# Patient Record
Sex: Female | Born: 1980 | Race: Black or African American | Hispanic: No | Smoking: Never smoker
Health system: Southern US, Community
[De-identification: ages and names within clinical notes are randomized; demographics above are authoritative.]

## PROBLEM LIST (undated history)

## (undated) ENCOUNTER — Inpatient Hospital Stay (HOSPITAL_COMMUNITY): Payer: Self-pay

## (undated) DIAGNOSIS — F32A Depression, unspecified: Secondary | ICD-10-CM

## (undated) DIAGNOSIS — F419 Anxiety disorder, unspecified: Secondary | ICD-10-CM

## (undated) DIAGNOSIS — K219 Gastro-esophageal reflux disease without esophagitis: Secondary | ICD-10-CM

## (undated) DIAGNOSIS — L0390 Cellulitis, unspecified: Secondary | ICD-10-CM

## (undated) DIAGNOSIS — B9562 Methicillin resistant Staphylococcus aureus infection as the cause of diseases classified elsewhere: Secondary | ICD-10-CM

## (undated) DIAGNOSIS — D573 Sickle-cell trait: Secondary | ICD-10-CM

## (undated) DIAGNOSIS — F329 Major depressive disorder, single episode, unspecified: Secondary | ICD-10-CM

## (undated) DIAGNOSIS — R51 Headache: Secondary | ICD-10-CM

## (undated) DIAGNOSIS — Z98891 History of uterine scar from previous surgery: Secondary | ICD-10-CM

## (undated) DIAGNOSIS — I209 Angina pectoris, unspecified: Secondary | ICD-10-CM

## (undated) DIAGNOSIS — R011 Cardiac murmur, unspecified: Secondary | ICD-10-CM

## (undated) DIAGNOSIS — A630 Anogenital (venereal) warts: Secondary | ICD-10-CM

## (undated) DIAGNOSIS — Z8719 Personal history of other diseases of the digestive system: Secondary | ICD-10-CM

## (undated) DIAGNOSIS — J302 Other seasonal allergic rhinitis: Secondary | ICD-10-CM

## (undated) DIAGNOSIS — K5900 Constipation, unspecified: Secondary | ICD-10-CM

## (undated) DIAGNOSIS — I499 Cardiac arrhythmia, unspecified: Secondary | ICD-10-CM

## (undated) HISTORY — PX: UPPER GI ENDOSCOPY: SHX6162

## (undated) HISTORY — DX: Anogenital (venereal) warts: A63.0

## (undated) HISTORY — DX: Major depressive disorder, single episode, unspecified: F32.9

## (undated) HISTORY — PX: HERNIA REPAIR: SHX51

## (undated) HISTORY — PX: COLONOSCOPY: SHX174

## (undated) HISTORY — PX: CERVICAL CONE BIOPSY: SUR198

## (undated) HISTORY — PX: WISDOM TOOTH EXTRACTION: SHX21

## (undated) HISTORY — DX: Cardiac murmur, unspecified: R01.1

## (undated) HISTORY — PX: MARSUPIALIZATION URETHRAL DIVERTICULUM: SUR844

## (undated) HISTORY — DX: Other seasonal allergic rhinitis: J30.2

## (undated) HISTORY — DX: Depression, unspecified: F32.A

---
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
## 2002-02-04 ENCOUNTER — Emergency Department (HOSPITAL_COMMUNITY): Admission: EM | Admit: 2002-02-04 | Discharge: 2002-02-04 | Payer: Self-pay | Admitting: Emergency Medicine

## 2002-02-05 ENCOUNTER — Encounter: Payer: Self-pay | Admitting: Emergency Medicine

## 2002-03-15 ENCOUNTER — Encounter: Admission: RE | Admit: 2002-03-15 | Discharge: 2002-03-15 | Payer: Self-pay | Admitting: Obstetrics and Gynecology

## 2002-03-19 ENCOUNTER — Inpatient Hospital Stay (HOSPITAL_COMMUNITY): Admission: AD | Admit: 2002-03-19 | Discharge: 2002-03-19 | Payer: Self-pay | Admitting: *Deleted

## 2002-03-26 ENCOUNTER — Encounter (INDEPENDENT_AMBULATORY_CARE_PROVIDER_SITE_OTHER): Payer: Self-pay

## 2002-03-26 ENCOUNTER — Other Ambulatory Visit: Admission: RE | Admit: 2002-03-26 | Discharge: 2002-03-26 | Payer: Self-pay | Admitting: *Deleted

## 2002-03-26 ENCOUNTER — Encounter: Admission: RE | Admit: 2002-03-26 | Discharge: 2002-03-26 | Payer: Self-pay | Admitting: *Deleted

## 2002-03-29 ENCOUNTER — Inpatient Hospital Stay (HOSPITAL_COMMUNITY): Admission: AD | Admit: 2002-03-29 | Discharge: 2002-03-29 | Payer: Self-pay | Admitting: Family Medicine

## 2002-04-16 ENCOUNTER — Encounter: Admission: RE | Admit: 2002-04-16 | Discharge: 2002-04-16 | Payer: Self-pay | Admitting: Obstetrics and Gynecology

## 2002-09-03 ENCOUNTER — Encounter: Admission: RE | Admit: 2002-09-03 | Discharge: 2002-09-03 | Payer: Self-pay | Admitting: Obstetrics and Gynecology

## 2002-09-03 ENCOUNTER — Encounter (INDEPENDENT_AMBULATORY_CARE_PROVIDER_SITE_OTHER): Payer: Self-pay

## 2002-09-27 ENCOUNTER — Encounter: Payer: Self-pay | Admitting: Emergency Medicine

## 2002-09-27 ENCOUNTER — Inpatient Hospital Stay (HOSPITAL_COMMUNITY): Admission: EM | Admit: 2002-09-27 | Discharge: 2002-09-28 | Payer: Self-pay | Admitting: Emergency Medicine

## 2002-09-27 ENCOUNTER — Encounter: Payer: Self-pay | Admitting: Internal Medicine

## 2002-10-08 ENCOUNTER — Encounter: Admission: RE | Admit: 2002-10-08 | Discharge: 2002-10-08 | Payer: Self-pay | Admitting: Obstetrics and Gynecology

## 2003-02-21 ENCOUNTER — Emergency Department (HOSPITAL_COMMUNITY): Admission: EM | Admit: 2003-02-21 | Discharge: 2003-02-21 | Payer: Self-pay | Admitting: Emergency Medicine

## 2003-02-28 ENCOUNTER — Emergency Department (HOSPITAL_COMMUNITY): Admission: EM | Admit: 2003-02-28 | Discharge: 2003-03-01 | Payer: Self-pay | Admitting: Emergency Medicine

## 2003-03-18 ENCOUNTER — Emergency Department (HOSPITAL_COMMUNITY): Admission: EM | Admit: 2003-03-18 | Discharge: 2003-03-18 | Payer: Self-pay | Admitting: Family Medicine

## 2003-03-28 ENCOUNTER — Emergency Department (HOSPITAL_COMMUNITY): Admission: AD | Admit: 2003-03-28 | Discharge: 2003-03-28 | Payer: Self-pay | Admitting: Family Medicine

## 2003-04-13 ENCOUNTER — Emergency Department (HOSPITAL_COMMUNITY): Admission: AD | Admit: 2003-04-13 | Discharge: 2003-04-13 | Payer: Self-pay | Admitting: Family Medicine

## 2003-04-15 ENCOUNTER — Emergency Department (HOSPITAL_COMMUNITY): Admission: AD | Admit: 2003-04-15 | Discharge: 2003-04-15 | Payer: Self-pay | Admitting: Family Medicine

## 2003-06-23 ENCOUNTER — Emergency Department (HOSPITAL_COMMUNITY): Admission: EM | Admit: 2003-06-23 | Discharge: 2003-06-23 | Payer: Self-pay | Admitting: Family Medicine

## 2003-07-31 ENCOUNTER — Emergency Department (HOSPITAL_COMMUNITY): Admission: EM | Admit: 2003-07-31 | Discharge: 2003-07-31 | Payer: Self-pay | Admitting: Emergency Medicine

## 2003-09-21 ENCOUNTER — Emergency Department (HOSPITAL_COMMUNITY): Admission: EM | Admit: 2003-09-21 | Discharge: 2003-09-21 | Payer: Self-pay | Admitting: Emergency Medicine

## 2003-09-23 ENCOUNTER — Emergency Department (HOSPITAL_COMMUNITY): Admission: EM | Admit: 2003-09-23 | Discharge: 2003-09-23 | Payer: Self-pay | Admitting: Emergency Medicine

## 2004-01-05 ENCOUNTER — Emergency Department (HOSPITAL_COMMUNITY): Admission: EM | Admit: 2004-01-05 | Discharge: 2004-01-05 | Payer: Self-pay | Admitting: Family Medicine

## 2004-01-08 ENCOUNTER — Emergency Department (HOSPITAL_COMMUNITY): Admission: EM | Admit: 2004-01-08 | Discharge: 2004-01-08 | Payer: Self-pay | Admitting: Family Medicine

## 2004-01-08 ENCOUNTER — Ambulatory Visit: Payer: Self-pay | Admitting: Obstetrics and Gynecology

## 2004-01-13 ENCOUNTER — Ambulatory Visit (HOSPITAL_COMMUNITY): Admission: RE | Admit: 2004-01-13 | Discharge: 2004-01-13 | Payer: Self-pay | Admitting: Obstetrics and Gynecology

## 2004-02-29 ENCOUNTER — Inpatient Hospital Stay (HOSPITAL_COMMUNITY): Admission: AD | Admit: 2004-02-29 | Discharge: 2004-02-29 | Payer: Self-pay | Admitting: Obstetrics & Gynecology

## 2004-05-05 ENCOUNTER — Ambulatory Visit: Payer: Self-pay | Admitting: Family Medicine

## 2004-05-06 ENCOUNTER — Ambulatory Visit: Payer: Self-pay | Admitting: *Deleted

## 2004-05-13 ENCOUNTER — Ambulatory Visit: Payer: Self-pay | Admitting: Family Medicine

## 2004-07-06 ENCOUNTER — Ambulatory Visit: Payer: Self-pay | Admitting: Family Medicine

## 2004-07-28 ENCOUNTER — Emergency Department (HOSPITAL_COMMUNITY): Admission: EM | Admit: 2004-07-28 | Discharge: 2004-07-28 | Payer: Self-pay | Admitting: Emergency Medicine

## 2004-08-18 ENCOUNTER — Ambulatory Visit: Payer: Self-pay | Admitting: Family Medicine

## 2004-09-03 ENCOUNTER — Ambulatory Visit: Payer: Self-pay | Admitting: Family Medicine

## 2004-09-30 ENCOUNTER — Ambulatory Visit: Payer: Self-pay | Admitting: Family Medicine

## 2004-10-02 ENCOUNTER — Emergency Department (HOSPITAL_COMMUNITY): Admission: EM | Admit: 2004-10-02 | Discharge: 2004-10-02 | Payer: Self-pay | Admitting: Emergency Medicine

## 2004-10-12 ENCOUNTER — Encounter: Admission: RE | Admit: 2004-10-12 | Discharge: 2004-10-12 | Payer: Self-pay | Admitting: Gastroenterology

## 2004-11-19 ENCOUNTER — Ambulatory Visit: Payer: Self-pay | Admitting: Internal Medicine

## 2004-11-25 ENCOUNTER — Ambulatory Visit: Payer: Self-pay | Admitting: Family Medicine

## 2004-11-25 ENCOUNTER — Encounter (INDEPENDENT_AMBULATORY_CARE_PROVIDER_SITE_OTHER): Payer: Self-pay | Admitting: *Deleted

## 2005-01-02 ENCOUNTER — Emergency Department (HOSPITAL_COMMUNITY): Admission: EM | Admit: 2005-01-02 | Discharge: 2005-01-03 | Payer: Self-pay | Admitting: Emergency Medicine

## 2005-01-27 ENCOUNTER — Emergency Department (HOSPITAL_COMMUNITY): Admission: EM | Admit: 2005-01-27 | Discharge: 2005-01-27 | Payer: Self-pay | Admitting: Emergency Medicine

## 2005-01-28 ENCOUNTER — Emergency Department (HOSPITAL_COMMUNITY): Admission: EM | Admit: 2005-01-28 | Discharge: 2005-01-28 | Payer: Self-pay | Admitting: Family Medicine

## 2005-01-31 ENCOUNTER — Emergency Department (HOSPITAL_COMMUNITY): Admission: EM | Admit: 2005-01-31 | Discharge: 2005-01-31 | Payer: Self-pay | Admitting: Family Medicine

## 2005-04-07 ENCOUNTER — Ambulatory Visit: Payer: Self-pay | Admitting: Family Medicine

## 2005-04-20 ENCOUNTER — Ambulatory Visit: Payer: Self-pay | Admitting: Family Medicine

## 2005-05-23 ENCOUNTER — Emergency Department (HOSPITAL_COMMUNITY): Admission: EM | Admit: 2005-05-23 | Discharge: 2005-05-23 | Payer: Self-pay | Admitting: Emergency Medicine

## 2005-06-30 ENCOUNTER — Ambulatory Visit: Payer: Self-pay | Admitting: Obstetrics and Gynecology

## 2005-06-30 ENCOUNTER — Encounter (INDEPENDENT_AMBULATORY_CARE_PROVIDER_SITE_OTHER): Payer: Self-pay | Admitting: *Deleted

## 2005-07-17 ENCOUNTER — Inpatient Hospital Stay (HOSPITAL_COMMUNITY): Admission: AD | Admit: 2005-07-17 | Discharge: 2005-07-17 | Payer: Self-pay | Admitting: Gynecology

## 2005-07-19 ENCOUNTER — Inpatient Hospital Stay (HOSPITAL_COMMUNITY): Admission: AD | Admit: 2005-07-19 | Discharge: 2005-07-19 | Payer: Self-pay | Admitting: Obstetrics and Gynecology

## 2005-08-08 ENCOUNTER — Ambulatory Visit: Payer: Self-pay | Admitting: Internal Medicine

## 2005-08-11 ENCOUNTER — Ambulatory Visit: Payer: Self-pay | Admitting: Obstetrics & Gynecology

## 2005-09-06 ENCOUNTER — Inpatient Hospital Stay (HOSPITAL_COMMUNITY): Admission: AD | Admit: 2005-09-06 | Discharge: 2005-09-06 | Payer: Self-pay | Admitting: Family Medicine

## 2005-09-14 ENCOUNTER — Ambulatory Visit: Payer: Self-pay | Admitting: Obstetrics & Gynecology

## 2005-09-16 ENCOUNTER — Encounter: Payer: Self-pay | Admitting: *Deleted

## 2005-09-17 ENCOUNTER — Emergency Department (HOSPITAL_COMMUNITY): Admission: EM | Admit: 2005-09-17 | Discharge: 2005-09-17 | Payer: Self-pay | Admitting: Emergency Medicine

## 2005-10-13 ENCOUNTER — Inpatient Hospital Stay (HOSPITAL_COMMUNITY): Admission: AD | Admit: 2005-10-13 | Discharge: 2005-10-13 | Payer: Self-pay | Admitting: Family Medicine

## 2005-10-27 ENCOUNTER — Inpatient Hospital Stay (HOSPITAL_COMMUNITY): Admission: AD | Admit: 2005-10-27 | Discharge: 2005-10-27 | Payer: Self-pay | Admitting: Obstetrics and Gynecology

## 2005-11-01 ENCOUNTER — Encounter (INDEPENDENT_AMBULATORY_CARE_PROVIDER_SITE_OTHER): Payer: Self-pay | Admitting: *Deleted

## 2005-11-01 ENCOUNTER — Ambulatory Visit (HOSPITAL_COMMUNITY): Admission: RE | Admit: 2005-11-01 | Discharge: 2005-11-01 | Payer: Self-pay | Admitting: Obstetrics and Gynecology

## 2006-01-19 ENCOUNTER — Ambulatory Visit (HOSPITAL_COMMUNITY): Admission: RE | Admit: 2006-01-19 | Discharge: 2006-01-19 | Payer: Self-pay | Admitting: Obstetrics and Gynecology

## 2006-03-31 ENCOUNTER — Emergency Department (HOSPITAL_COMMUNITY): Admission: EM | Admit: 2006-03-31 | Discharge: 2006-03-31 | Payer: Self-pay | Admitting: Family Medicine

## 2006-05-04 ENCOUNTER — Ambulatory Visit: Payer: Self-pay | Admitting: Internal Medicine

## 2006-06-06 ENCOUNTER — Ambulatory Visit: Payer: Self-pay | Admitting: Internal Medicine

## 2006-07-24 ENCOUNTER — Emergency Department (HOSPITAL_COMMUNITY): Admission: EM | Admit: 2006-07-24 | Discharge: 2006-07-24 | Payer: Self-pay | Admitting: Family Medicine

## 2006-08-07 ENCOUNTER — Ambulatory Visit: Payer: Self-pay | Admitting: Internal Medicine

## 2006-08-13 ENCOUNTER — Emergency Department (HOSPITAL_COMMUNITY): Admission: EM | Admit: 2006-08-13 | Discharge: 2006-08-13 | Payer: Self-pay | Admitting: Emergency Medicine

## 2006-09-18 ENCOUNTER — Inpatient Hospital Stay (HOSPITAL_COMMUNITY): Admission: AD | Admit: 2006-09-18 | Discharge: 2006-09-19 | Payer: Self-pay | Admitting: Obstetrics & Gynecology

## 2006-09-20 ENCOUNTER — Telehealth (INDEPENDENT_AMBULATORY_CARE_PROVIDER_SITE_OTHER): Payer: Self-pay | Admitting: Internal Medicine

## 2006-09-22 ENCOUNTER — Ambulatory Visit: Payer: Self-pay | Admitting: Internal Medicine

## 2006-09-22 ENCOUNTER — Encounter (INDEPENDENT_AMBULATORY_CARE_PROVIDER_SITE_OTHER): Payer: Self-pay | Admitting: Nurse Practitioner

## 2006-09-22 LAB — CONVERTED CEMR LAB

## 2006-10-02 ENCOUNTER — Telehealth (INDEPENDENT_AMBULATORY_CARE_PROVIDER_SITE_OTHER): Payer: Self-pay | Admitting: Internal Medicine

## 2006-10-06 ENCOUNTER — Encounter (INDEPENDENT_AMBULATORY_CARE_PROVIDER_SITE_OTHER): Payer: Self-pay | Admitting: Nurse Practitioner

## 2006-10-06 ENCOUNTER — Ambulatory Visit: Payer: Self-pay | Admitting: Internal Medicine

## 2006-10-06 DIAGNOSIS — J45909 Unspecified asthma, uncomplicated: Secondary | ICD-10-CM | POA: Insufficient documentation

## 2006-10-06 DIAGNOSIS — K219 Gastro-esophageal reflux disease without esophagitis: Secondary | ICD-10-CM | POA: Insufficient documentation

## 2006-10-06 DIAGNOSIS — E669 Obesity, unspecified: Secondary | ICD-10-CM

## 2006-10-06 DIAGNOSIS — J309 Allergic rhinitis, unspecified: Secondary | ICD-10-CM | POA: Insufficient documentation

## 2006-10-06 DIAGNOSIS — A5400 Gonococcal infection of lower genitourinary tract, unspecified: Secondary | ICD-10-CM | POA: Insufficient documentation

## 2006-10-06 LAB — CONVERTED CEMR LAB: GC Probe Amp, Urine: NEGATIVE

## 2006-10-09 DIAGNOSIS — R87619 Unspecified abnormal cytological findings in specimens from cervix uteri: Secondary | ICD-10-CM

## 2006-10-11 ENCOUNTER — Encounter (INDEPENDENT_AMBULATORY_CARE_PROVIDER_SITE_OTHER): Payer: Self-pay | Admitting: *Deleted

## 2006-10-12 ENCOUNTER — Telehealth (INDEPENDENT_AMBULATORY_CARE_PROVIDER_SITE_OTHER): Payer: Self-pay | Admitting: *Deleted

## 2006-12-28 ENCOUNTER — Emergency Department (HOSPITAL_COMMUNITY): Admission: EM | Admit: 2006-12-28 | Discharge: 2006-12-28 | Payer: Self-pay | Admitting: Emergency Medicine

## 2006-12-28 ENCOUNTER — Encounter (INDEPENDENT_AMBULATORY_CARE_PROVIDER_SITE_OTHER): Payer: Self-pay | Admitting: Internal Medicine

## 2007-02-23 ENCOUNTER — Ambulatory Visit: Payer: Self-pay | Admitting: Internal Medicine

## 2007-02-23 ENCOUNTER — Encounter (INDEPENDENT_AMBULATORY_CARE_PROVIDER_SITE_OTHER): Payer: Self-pay | Admitting: Internal Medicine

## 2007-02-23 DIAGNOSIS — R1013 Epigastric pain: Secondary | ICD-10-CM

## 2007-02-23 DIAGNOSIS — R002 Palpitations: Secondary | ICD-10-CM

## 2007-02-23 DIAGNOSIS — R079 Chest pain, unspecified: Secondary | ICD-10-CM

## 2007-02-24 ENCOUNTER — Encounter (INDEPENDENT_AMBULATORY_CARE_PROVIDER_SITE_OTHER): Payer: Self-pay | Admitting: Internal Medicine

## 2007-03-06 ENCOUNTER — Encounter (INDEPENDENT_AMBULATORY_CARE_PROVIDER_SITE_OTHER): Payer: Self-pay | Admitting: Internal Medicine

## 2007-03-12 ENCOUNTER — Ambulatory Visit (HOSPITAL_COMMUNITY): Admission: RE | Admit: 2007-03-12 | Discharge: 2007-03-12 | Payer: Self-pay | Admitting: Internal Medicine

## 2007-05-08 ENCOUNTER — Emergency Department (HOSPITAL_COMMUNITY): Admission: EM | Admit: 2007-05-08 | Discharge: 2007-05-08 | Payer: Self-pay | Admitting: Emergency Medicine

## 2007-06-13 ENCOUNTER — Emergency Department (HOSPITAL_COMMUNITY): Admission: EM | Admit: 2007-06-13 | Discharge: 2007-06-13 | Payer: Self-pay | Admitting: Family Medicine

## 2007-08-14 ENCOUNTER — Emergency Department (HOSPITAL_COMMUNITY): Admission: EM | Admit: 2007-08-14 | Discharge: 2007-08-14 | Payer: Self-pay | Admitting: Emergency Medicine

## 2007-09-06 ENCOUNTER — Emergency Department (HOSPITAL_COMMUNITY): Admission: EM | Admit: 2007-09-06 | Discharge: 2007-09-06 | Payer: Self-pay | Admitting: Emergency Medicine

## 2007-09-07 ENCOUNTER — Emergency Department (HOSPITAL_COMMUNITY): Admission: EM | Admit: 2007-09-07 | Discharge: 2007-09-07 | Payer: Self-pay | Admitting: Emergency Medicine

## 2007-09-11 ENCOUNTER — Ambulatory Visit: Payer: Self-pay | Admitting: Internal Medicine

## 2007-09-11 DIAGNOSIS — R519 Headache, unspecified: Secondary | ICD-10-CM | POA: Insufficient documentation

## 2007-09-11 DIAGNOSIS — R51 Headache: Secondary | ICD-10-CM

## 2007-09-11 DIAGNOSIS — F341 Dysthymic disorder: Secondary | ICD-10-CM

## 2007-09-14 ENCOUNTER — Ambulatory Visit: Payer: Self-pay | Admitting: Internal Medicine

## 2007-09-20 ENCOUNTER — Encounter: Admission: RE | Admit: 2007-09-20 | Discharge: 2007-10-24 | Payer: Self-pay | Admitting: Internal Medicine

## 2007-09-20 ENCOUNTER — Encounter (INDEPENDENT_AMBULATORY_CARE_PROVIDER_SITE_OTHER): Payer: Self-pay | Admitting: Internal Medicine

## 2007-10-03 ENCOUNTER — Encounter (INDEPENDENT_AMBULATORY_CARE_PROVIDER_SITE_OTHER): Payer: Self-pay | Admitting: Internal Medicine

## 2007-11-06 ENCOUNTER — Telehealth (INDEPENDENT_AMBULATORY_CARE_PROVIDER_SITE_OTHER): Payer: Self-pay | Admitting: Internal Medicine

## 2007-12-24 ENCOUNTER — Ambulatory Visit: Payer: Self-pay | Admitting: Internal Medicine

## 2008-01-02 ENCOUNTER — Ambulatory Visit: Payer: Self-pay | Admitting: Internal Medicine

## 2008-01-02 DIAGNOSIS — B379 Candidiasis, unspecified: Secondary | ICD-10-CM | POA: Insufficient documentation

## 2008-01-02 LAB — CONVERTED CEMR LAB
Bilirubin Urine: NEGATIVE
KOH Prep: NEGATIVE
Ketones, urine, test strip: NEGATIVE
Urobilinogen, UA: 0.2

## 2008-03-25 ENCOUNTER — Encounter (INDEPENDENT_AMBULATORY_CARE_PROVIDER_SITE_OTHER): Payer: Self-pay | Admitting: Internal Medicine

## 2008-03-25 ENCOUNTER — Ambulatory Visit: Payer: Self-pay | Admitting: Internal Medicine

## 2008-03-25 DIAGNOSIS — R5383 Other fatigue: Secondary | ICD-10-CM

## 2008-03-25 DIAGNOSIS — N644 Mastodynia: Secondary | ICD-10-CM

## 2008-03-25 DIAGNOSIS — R5381 Other malaise: Secondary | ICD-10-CM | POA: Insufficient documentation

## 2008-03-25 LAB — CONVERTED CEMR LAB
Basophils Relative: 0 % (ref 0–1)
Beta hcg, urine, semiquantitative: NEGATIVE
Bilirubin Urine: NEGATIVE
Blood in Urine, dipstick: NEGATIVE
CO2: 23 meq/L (ref 19–32)
Calcium: 9 mg/dL (ref 8.4–10.5)
Chloride: 107 meq/L (ref 96–112)
Creatinine, Ser: 0.69 mg/dL (ref 0.40–1.20)
Eosinophils Absolute: 0.2 10*3/uL (ref 0.0–0.7)
Glucose, Bld: 65 mg/dL — ABNORMAL LOW (ref 70–99)
Glucose, Urine, Semiquant: NEGATIVE
HCT: 39.8 % (ref 36.0–46.0)
Hemoglobin: 12.7 g/dL (ref 12.0–15.0)
KOH Prep: NEGATIVE
Ketones, urine, test strip: NEGATIVE
MCHC: 31.9 g/dL (ref 30.0–36.0)
MCV: 82.7 fL (ref 78.0–100.0)
Monocytes Absolute: 0.6 10*3/uL (ref 0.1–1.0)
Monocytes Relative: 7 % (ref 3–12)
Nitrite: NEGATIVE
Protein, U semiquant: NEGATIVE
RDW: 14.9 % (ref 11.5–15.5)
Specific Gravity, Urine: 1.01
TSH: 0.715 microintl units/mL (ref 0.350–4.50)
Total Bilirubin: 0.3 mg/dL (ref 0.3–1.2)
Total Protein: 7.2 g/dL (ref 6.0–8.3)
Urobilinogen, UA: 0.2
WBC Urine, dipstick: NEGATIVE
Whiff Test: POSITIVE
pH: 6.5

## 2008-03-26 ENCOUNTER — Emergency Department (HOSPITAL_COMMUNITY): Admission: EM | Admit: 2008-03-26 | Discharge: 2008-03-26 | Payer: Self-pay | Admitting: Family Medicine

## 2008-04-05 ENCOUNTER — Emergency Department (HOSPITAL_COMMUNITY): Admission: EM | Admit: 2008-04-05 | Discharge: 2008-04-05 | Payer: Self-pay | Admitting: Emergency Medicine

## 2008-04-13 ENCOUNTER — Encounter (INDEPENDENT_AMBULATORY_CARE_PROVIDER_SITE_OTHER): Payer: Self-pay | Admitting: Internal Medicine

## 2008-04-30 ENCOUNTER — Ambulatory Visit: Payer: Self-pay | Admitting: Internal Medicine

## 2008-05-15 ENCOUNTER — Encounter (INDEPENDENT_AMBULATORY_CARE_PROVIDER_SITE_OTHER): Payer: Self-pay | Admitting: Internal Medicine

## 2008-05-15 ENCOUNTER — Emergency Department (HOSPITAL_COMMUNITY): Admission: EM | Admit: 2008-05-15 | Discharge: 2008-05-15 | Payer: Self-pay | Admitting: Family Medicine

## 2008-05-16 ENCOUNTER — Emergency Department (HOSPITAL_COMMUNITY): Admission: EM | Admit: 2008-05-16 | Discharge: 2008-05-16 | Payer: Self-pay | Admitting: Emergency Medicine

## 2008-05-17 ENCOUNTER — Emergency Department (HOSPITAL_COMMUNITY): Admission: EM | Admit: 2008-05-17 | Discharge: 2008-05-17 | Payer: Self-pay | Admitting: Emergency Medicine

## 2008-05-18 ENCOUNTER — Emergency Department (HOSPITAL_COMMUNITY): Admission: EM | Admit: 2008-05-18 | Discharge: 2008-05-18 | Payer: Self-pay | Admitting: Emergency Medicine

## 2008-05-30 ENCOUNTER — Ambulatory Visit: Payer: Self-pay | Admitting: Internal Medicine

## 2008-05-30 DIAGNOSIS — J019 Acute sinusitis, unspecified: Secondary | ICD-10-CM | POA: Insufficient documentation

## 2008-08-05 ENCOUNTER — Inpatient Hospital Stay (HOSPITAL_COMMUNITY): Admission: AD | Admit: 2008-08-05 | Discharge: 2008-08-05 | Payer: Self-pay | Admitting: Obstetrics & Gynecology

## 2008-08-06 ENCOUNTER — Telehealth (INDEPENDENT_AMBULATORY_CARE_PROVIDER_SITE_OTHER): Payer: Self-pay | Admitting: Internal Medicine

## 2008-08-14 ENCOUNTER — Telehealth (INDEPENDENT_AMBULATORY_CARE_PROVIDER_SITE_OTHER): Payer: Self-pay | Admitting: Internal Medicine

## 2008-08-28 ENCOUNTER — Emergency Department (HOSPITAL_COMMUNITY): Admission: EM | Admit: 2008-08-28 | Discharge: 2008-08-29 | Payer: Self-pay | Admitting: Emergency Medicine

## 2008-11-27 ENCOUNTER — Emergency Department (HOSPITAL_COMMUNITY): Admission: EM | Admit: 2008-11-27 | Discharge: 2008-11-27 | Payer: Self-pay | Admitting: Emergency Medicine

## 2008-12-09 ENCOUNTER — Ambulatory Visit: Payer: Self-pay | Admitting: Internal Medicine

## 2008-12-09 DIAGNOSIS — N946 Dysmenorrhea, unspecified: Secondary | ICD-10-CM

## 2008-12-10 LAB — CONVERTED CEMR LAB: Preg, Serum: NEGATIVE

## 2009-01-20 ENCOUNTER — Emergency Department (HOSPITAL_COMMUNITY): Admission: EM | Admit: 2009-01-20 | Discharge: 2009-01-20 | Payer: Self-pay | Admitting: Emergency Medicine

## 2009-01-23 ENCOUNTER — Emergency Department (HOSPITAL_COMMUNITY): Admission: EM | Admit: 2009-01-23 | Discharge: 2009-01-23 | Payer: Self-pay | Admitting: Emergency Medicine

## 2009-02-16 ENCOUNTER — Emergency Department (HOSPITAL_COMMUNITY): Admission: EM | Admit: 2009-02-16 | Discharge: 2009-02-16 | Payer: Self-pay | Admitting: Psychiatry

## 2009-04-25 ENCOUNTER — Emergency Department (HOSPITAL_COMMUNITY): Admission: EM | Admit: 2009-04-25 | Discharge: 2009-04-25 | Payer: Self-pay | Admitting: Emergency Medicine

## 2009-05-30 ENCOUNTER — Emergency Department (HOSPITAL_COMMUNITY): Admission: EM | Admit: 2009-05-30 | Discharge: 2009-05-31 | Payer: Self-pay | Admitting: Emergency Medicine

## 2009-06-18 ENCOUNTER — Ambulatory Visit: Payer: Self-pay | Admitting: Internal Medicine

## 2009-08-14 ENCOUNTER — Ambulatory Visit: Payer: Self-pay | Admitting: Internal Medicine

## 2009-08-14 DIAGNOSIS — G4700 Insomnia, unspecified: Secondary | ICD-10-CM

## 2009-08-14 LAB — CONVERTED CEMR LAB
ALT: 24 units/L (ref 0–35)
BUN: 9 mg/dL (ref 6–23)
CO2: 26 meq/L (ref 19–32)
Calcium: 8.9 mg/dL (ref 8.4–10.5)
Chloride: 105 meq/L (ref 96–112)
Creatinine, Ser: 0.7 mg/dL (ref 0.40–1.20)
GC Probe Amp, Genital: NEGATIVE
Glucose, Urine, Semiquant: NEGATIVE
Nitrite: NEGATIVE
Pap Smear: NEGATIVE
Protein, U semiquant: NEGATIVE
Specific Gravity, Urine: 1.02
Total Bilirubin: 0.3 mg/dL (ref 0.3–1.2)
WBC Urine, dipstick: NEGATIVE
Whiff Test: POSITIVE

## 2009-08-17 ENCOUNTER — Emergency Department (HOSPITAL_COMMUNITY): Admission: EM | Admit: 2009-08-17 | Discharge: 2009-08-17 | Payer: Self-pay | Admitting: Emergency Medicine

## 2009-08-19 ENCOUNTER — Encounter (INDEPENDENT_AMBULATORY_CARE_PROVIDER_SITE_OTHER): Payer: Self-pay | Admitting: Internal Medicine

## 2009-08-22 ENCOUNTER — Encounter (INDEPENDENT_AMBULATORY_CARE_PROVIDER_SITE_OTHER): Payer: Self-pay | Admitting: Internal Medicine

## 2009-10-06 ENCOUNTER — Ambulatory Visit: Payer: Self-pay | Admitting: Family

## 2009-10-06 ENCOUNTER — Inpatient Hospital Stay (HOSPITAL_COMMUNITY): Admission: AD | Admit: 2009-10-06 | Discharge: 2009-10-06 | Payer: Self-pay | Admitting: Obstetrics & Gynecology

## 2009-10-13 ENCOUNTER — Ambulatory Visit (HOSPITAL_COMMUNITY): Admission: RE | Admit: 2009-10-13 | Discharge: 2009-10-13 | Payer: Self-pay | Admitting: Obstetrics & Gynecology

## 2009-10-21 ENCOUNTER — Emergency Department (HOSPITAL_COMMUNITY): Admission: EM | Admit: 2009-10-21 | Discharge: 2009-10-21 | Payer: Self-pay | Admitting: Emergency Medicine

## 2009-10-24 ENCOUNTER — Emergency Department (HOSPITAL_COMMUNITY): Admission: EM | Admit: 2009-10-24 | Discharge: 2009-10-24 | Payer: Self-pay | Admitting: Emergency Medicine

## 2009-12-09 ENCOUNTER — Telehealth (INDEPENDENT_AMBULATORY_CARE_PROVIDER_SITE_OTHER): Payer: Self-pay | Admitting: Internal Medicine

## 2009-12-23 ENCOUNTER — Encounter: Payer: Self-pay | Admitting: Obstetrics & Gynecology

## 2009-12-23 ENCOUNTER — Ambulatory Visit: Payer: Self-pay | Admitting: Obstetrics and Gynecology

## 2009-12-23 ENCOUNTER — Other Ambulatory Visit
Admission: RE | Admit: 2009-12-23 | Discharge: 2009-12-23 | Payer: Self-pay | Source: Home / Self Care | Admitting: Family Medicine

## 2009-12-23 LAB — CONVERTED CEMR LAB
Chlamydia, DNA Probe: NEGATIVE
GC Probe Amp, Genital: NEGATIVE

## 2009-12-24 ENCOUNTER — Encounter: Payer: Self-pay | Admitting: Obstetrics & Gynecology

## 2009-12-24 LAB — CONVERTED CEMR LAB
Trich, Wet Prep: NONE SEEN
Yeast Wet Prep HPF POC: NONE SEEN

## 2009-12-30 ENCOUNTER — Ambulatory Visit: Payer: Self-pay | Admitting: Obstetrics & Gynecology

## 2010-01-07 ENCOUNTER — Ambulatory Visit: Payer: Self-pay | Admitting: Obstetrics and Gynecology

## 2010-01-21 ENCOUNTER — Ambulatory Visit: Payer: Self-pay | Admitting: Obstetrics & Gynecology

## 2010-01-27 ENCOUNTER — Ambulatory Visit (HOSPITAL_COMMUNITY)
Admission: RE | Admit: 2010-01-27 | Discharge: 2010-01-27 | Payer: Self-pay | Source: Home / Self Care | Attending: Obstetrics and Gynecology | Admitting: Obstetrics and Gynecology

## 2010-02-04 ENCOUNTER — Ambulatory Visit: Admit: 2010-02-04 | Payer: Self-pay | Admitting: Obstetrics and Gynecology

## 2010-02-09 ENCOUNTER — Ambulatory Visit
Admission: RE | Admit: 2010-02-09 | Discharge: 2010-02-09 | Payer: Self-pay | Source: Home / Self Care | Attending: Internal Medicine | Admitting: Internal Medicine

## 2010-02-10 ENCOUNTER — Ambulatory Visit: Admit: 2010-02-10 | Payer: Self-pay | Admitting: Obstetrics and Gynecology

## 2010-02-11 ENCOUNTER — Ambulatory Visit
Admission: RE | Admit: 2010-02-11 | Discharge: 2010-02-11 | Payer: Self-pay | Source: Home / Self Care | Attending: Obstetrics and Gynecology | Admitting: Obstetrics and Gynecology

## 2010-02-12 NOTE — Progress Notes (Unsigned)
NAMEJUNA, CABAN NO.:  [id number]  MEDICAL RECORD NO.:  [id number]          PATIENT TYPE:  WOC  LOCATION:  [facility]                   FACILITY:  [facility]  PHYSICIAN:  Argentina Donovan, MD        DATE OF BIRTH:  22-Dec-1980  DATE OF SERVICE:  02/11/2010                                 CLINIC NOTE  The patient is a 30 year old gravida 1, para 0-0-1-0 who is charting her ovulations and periods by temperature chart as well as using a map on her phone.  She is trying get pregnant.  She has chronic recurrent pelvic pain due to recurrent ovarian cysts.  She does not want at that this point to suppress ovulation because she is trying get pregnant. The problem is that her paramour is frequently out-of-town working, but in spite of her morbid obesity, 5 feet 2 inches, 278 pounds, she seems to be fairly regular with her periods and the fact that on ultrasound, she is forming these corpus luteum cyst, I am expecting that she probably is ovulatory, although she did not bring in the temperature chart today.  She will bring one in the future.  I have told her if she wants to be suppressed, we could start her on Lo Loestrin and probably resolve the cysts if the current condition of her partner is going to be a long-term.  Please see previous note.  In any case, she was in today for the ultrasound results, which showed 2 cm left corpus luteum cyst.          ______________________________ Argentina Donovan, MD    PR/MEDQ  D:  02/11/2010  T:  02/12/2010  Job:  [id number]

## 2010-02-21 LAB — CONVERTED CEMR LAB
ALT: 14 units/L (ref 0–35)
BUN: 14 mg/dL (ref 6–23)
CO2: 25 meq/L (ref 19–32)
Calcium: 9.3 mg/dL (ref 8.4–10.5)
Chlamydia, DNA Probe: NEGATIVE
Chloride: 105 meq/L (ref 96–112)
Cholesterol: 169 mg/dL (ref 0–200)
Creatinine, Ser: 0.72 mg/dL (ref 0.40–1.20)
GC Probe Amp, Genital: NEGATIVE
Glucose, Urine, Semiquant: NEGATIVE
HDL: 42 mg/dL (ref >39)
Hemoglobin: 12.2 g/dL (ref 12.0–15.0)
Ketones, urine, test strip: NEGATIVE
Lymphocytes Relative: 19 % (ref 12–46)
Lymphs Abs: 1.9 10*3/uL (ref 0.7–4.0)
Monocytes Absolute: 0.7 10*3/uL (ref 0.1–1.0)
Monocytes Relative: 7 % (ref 3–12)
Neutro Abs: 6.9 10*3/uL (ref 1.7–7.7)
RBC: 4.62 M/uL (ref 3.87–5.11)
Total CHOL/HDL Ratio: 4

## 2010-02-23 NOTE — Letter (Signed)
Summary: *HSN Results Follow up  [facility]  [address] [place], [place] [address]   Phone: [phone]  Fax: [phone]      08/19/2009   Amanda [name] [address] [place], [place]  [address]   Dear  Ms. [name],                            ____S.Drinkard,FNP   ____D. Gore,FNP       ____B. McPherson,MD   ____V. Rankins,MD    _X___E. Analyse Angst,MD    ____N. Daphine Deutscher, FNP  ____D. Reche Dixon, MD    ____K. Philipp Deputy, MD    ____Other     This letter is to inform you that your recent test(s):  _______Pap Smear    ___X____Lab Test     _______X-ray    ___X____ is within acceptable limits  _______ requires a medication change  _______ requires a follow-up lab visit  _______ requires a follow-up visit with your provider   Comments:  Still waiting on pap smear--will call if abnormal only       _________________________________________________________ If you have any questions, please contact our office                     Sincerely,  Julieanne Manson MD [facility]

## 2010-02-23 NOTE — Assessment & Plan Note (Signed)
Summary: ALLERGIES//GK   Vital Signs:  Patient profile:   30 year old female Weight:      281 pounds BMI:     51.58 Temp:     98.2 degrees F Pulse rate:   76 / minute Pulse rhythm:   regular Resp:     20 per minute BP sitting:   112 / 76  (left arm) Cuff size:   large  Vitals Entered By: Vesta Mixer CMA (Jun 18, 2009 8:48 AM) CC: Runny watery eyes, feels like pebbles in her eyes,  She is not taking ANY of her meds. Is Patient Diabetic? No Pain Assessment Patient in pain? no       Does patient need assistance? Ambulation Normal   CC:  Runny watery eyes, feels like pebbles in her eyes, and She is not taking ANY of her meds..  History of Present Illness: 1.  Eye symptoms as above.  Also with itchy, sneezing, watery nose.  Has been out of meds for some time.  Has been traveling and having difficulty getting in.  Off of all meds.  2.  Anxiety and Depression:  Off Lexapro for some time--does not feel like she needs it.    3.  Insomnia:  Moved recently.  Hearing noises at night, though states she is living in a safe neighborhood.   4.  Seen in ED for lower abdominal pain--resolving physiologic cyst in left ovary and BV diagnosed--treated for the latter.  Labs were otherwise normal.   5.  GERD:  did not like Famotidine.  Has been taking Ranitidine 300 mg daily and feel better on it--good control of symptoms.  Allergies: 1)  ! Doxy-Caps 2)  ! Amoxicillin  Physical Exam  General:  obese, NAD Eyes:  No current injection Ears:  External ear exam shows no significant lesions or deformities.  Otoscopic examination reveals clear canals, tympanic membranes are intact bilaterally without bulging, retraction, inflammation or discharge. Hearing is grossly normal bilaterally. Nose:  nasal dischargemucosal pallor.   Mouth:  pharynx pink and moist.   Neck:  No deformities, masses, or tenderness noted. Lungs:  Normal respiratory effort, chest expands symmetrically. Lungs are clear to  auscultation, no crackles or wheezes. Heart:  Normal rate and regular rhythm. S1 and S2 normal without gallop, murmur, click, rub or other extra sounds.  Radial pulses normal and equal   Impression & Recommendations:  Problem # 1:  DEPRESSION/ANXIETY (ICD-300.4) Does not feel she needs to be on Lexapro  Problem # 2:  ALLERGIC RHINITIS (ICD-477.9) Restart meds Switch Allegra to Xyzal as the prior no longer carried by pharmacy The following medications were removed from the medication list:    Allegra 180 Mg Tabs (Fexofenadine hcl) .Marland Kitchen... Take 1 tablet by mouth once a day Her updated medication list for this problem includes:    Nasacort Aq 55 Mcg/act Aers (Triamcinolone acetonide(nasal)) .Marland Kitchen... 2 sprays in each nostril daily    Xyzal 5 Mg Tabs (Levocetirizine dihydrochloride) .Marland Kitchen... 1 tab by mouth every day as needed for allergies  Problem # 3:  ASTHMA (ICD-493.90) Restart meds Her updated medication list for this problem includes:    Singulair 10 Mg Tabs (Montelukast sodium) .Marland Kitchen... Take 1 tablet by mouth once a day    Proventil 90 Mcg/act Aers (Albuterol) .Marland Kitchen... 1-2 puffs every 6 hours as needed for shortness of breath    Asmanex 60 Metered Doses 220 Mcg/inh Aepb (Mometasone furoate) .Marland Kitchen... 1 puffs twice daily for asthma  Problem # 4:  GERD (  ICD-530.81) Switch to Ranitidine Her updated medication list for this problem includes:    Ranitidine Hcl 150 Mg Tabs (Ranitidine hcl) .Marland Kitchen... 2 tabs by mouth daily  Complete Medication List: 1)  Singulair 10 Mg Tabs (Montelukast sodium) .... Take 1 tablet by mouth once a day 2)  Proventil 90 Mcg/act Aers (Albuterol) .Marland Kitchen.. 1-2 puffs every 6 hours as needed for shortness of breath 3)  Nasacort Aq 55 Mcg/act Aers (Triamcinolone acetonide(nasal)) .... 2 sprays in each nostril daily 4)  Asmanex 60 Metered Doses 220 Mcg/inh Aepb (Mometasone furoate) .Marland Kitchen.. 1 puffs twice daily for asthma 5)  Ranitidine Hcl 150 Mg Tabs (Ranitidine hcl) .... 2 tabs by mouth  daily 6)  Amitriptyline Hcl 10 Mg Tabs (Amitriptyline hcl) .Marland Kitchen.. 1 tab by mouth at bedtime for sleep. 7)  Xyzal 5 Mg Tabs (Levocetirizine dihydrochloride) .Marland Kitchen.. 1 tab by mouth every day as needed for allergies  Patient Instructions: 1)  Keep appt. for CPP Prescriptions: PROVENTIL 90 MCG/ACT  AERS (ALBUTEROL) 1-2 puffs every 6 hours as needed for shortness of breath  #1 x 1   Entered and Authorized by:   Julieanne Manson MD   Signed by:   Julieanne Manson MD on 06/18/2009   Method used:   Faxed to ...       [facility] - [facility] (retail)       [address] [place], [place]  [address]       Ph: [phone]       Fax: [phone]   RxID:   [id number] ASMANEX 60 METERED DOSES 220 MCG/INH  AEPB (MOMETASONE FUROATE) 1 puffs twice daily for asthma  #1 x 11   Entered and Authorized by:   Julieanne Manson MD   Signed by:   Julieanne Manson MD on 06/18/2009   Method used:   Faxed to ...       [facility] - Pharmac (retail)       [address] [place], [place]  [address]       Ph: [phone]       Fax: [phone]   RxID:   [id number] NASACORT AQ 55 MCG/ACT  AERS (TRIAMCINOLONE ACETONIDE(NASAL)) 2 sprays in each nostril daily  #1 x 11   Entered and Authorized by:   Julieanne Manson MD   Signed by:   Julieanne Manson MD on 06/18/2009   Method used:   Faxed to ...       [facility] - Pharmac (retail)       [address] [place], [place]  [address]       Ph: [phone]       Fax: [phone]   RxID:   [id number] SINGULAIR 10 MG  TABS (MONTELUKAST SODIUM) Take 1 tablet by mouth once a day  #30 x 11   Entered and Authorized by:   Julieanne Manson MD   Signed by:   Julieanne Manson MD on 06/18/2009   Method used:   Faxed to ...       [facility] - Pharmac (retail)       [address] [place], [place]  [address]        Ph: [phone]       Fax: [phone]   RxID:   [id number] XYZAL 5 MG TABS (LEVOCETIRIZINE DIHYDROCHLORIDE) 1 tab by mouth every day  as needed for allergies  #30 x 11   Entered and Authorized by:   Julieanne Manson MD   Signed by:   Julieanne Manson MD on 06/18/2009   Method used:   Faxed to ...       [facility] - Pharmac (retail)       [address] [place], [place]  [address]       Ph: [phone]       Fax: [phone]   RxID:   [id number] AMITRIPTYLINE HCL 10 MG TABS (AMITRIPTYLINE HCL) 1 tab by mouth at bedtime for sleep.  #30 x 6   Entered and Authorized by:   Julieanne Manson MD   Signed by:   Julieanne Manson MD on 06/18/2009   Method used:   Faxed to ...       [facility] - Pharmac (retail)       [address] [place], [place]  [address]       Ph: [phone]       Fax: [phone]   RxID:   [id number] RANITIDINE HCL 150 MG TABS (RANITIDINE HCL) 2 tabs by mouth daily  #60 x 11   Entered and Authorized by:   Julieanne Manson MD   Signed by:   Julieanne Manson MD on 06/18/2009   Method used:   Faxed to ...       [facility] - Pharmac (retail)       [address] [place], [place]  [address]       Ph: [phone]       Fax: [phone]   RxID:   [id number]

## 2010-02-23 NOTE — Letter (Signed)
Summary: *HSN Results Follow up  [facility]  [address] [place], [place] [address]   Phone: [phone]  Fax: [phone]      08/22/2009   Amanda [name] [address] [place], [place]  [address]   Dear  [name],                            ____S.Drinkard,FNP   ____D. Gore,FNP       ____B. McPherson,MD   ____V. Rankins,MD    __X__E. Julis Haubner,MD    ____N. Daphine Deutscher, FNP  ____D. Reche Dixon, MD    ____K. Philipp Deputy, MD    ____Other     This letter is to inform you that your recent test(s):  ___X____Pap Smear    _______Lab Test     _______X-ray    ___X____ is within acceptable limits  _______ requires a medication change  _______ requires a follow-up lab visit  _______ requires a follow-up visit with your provider   Comments:       _________________________________________________________ If you have any questions, please contact our office                     Sincerely,  Julieanne Manson MD [facility]

## 2010-02-23 NOTE — Progress Notes (Signed)
Summary: Office Visit//DEPRESSION SCREENING  Office Visit//DEPRESSION SCREENING   Imported By: Arta Bruce 08/17/2009 09:16:14  _____________________________________________________________________  External Attachment:    Type:   Image     Comment:   External Document

## 2010-02-23 NOTE — Assessment & Plan Note (Signed)
Summary: CPP///////KT   Vital Signs:  Patient profile:   30 year old female LMP:     08/08/2009 Weight:      289 pounds Temp:     98.0 degrees F Pulse rate:   87 / minute Pulse rhythm:   regular Resp:     20 per minute BP sitting:   129 / 84  (left arm) Cuff size:   large  Vitals Entered By: Vesta Mixer CMA (August 14, 2009 3:52 PM) CC: CPP, Preventive Care Is Patient Diabetic? No  Does patient need assistance? Ambulation Normal LMP (date): 08/08/2009 LMP - Character: light     Enter LMP: 08/08/2009 Last PAP Result NEGATIVE FOR INTRAEPITHELIAL LESIONS OR MALIGNANCY.   CC:  CPP and Preventive Care.  History of Present Illness: 30 yo female here for CPP.  Concerns:   1.  Insomnia:  pt. does well with 20 mg, but 10 mg of Amitriptyline not enough.  Allergies (verified): 1)  ! Doxy-Caps 2)  ! Amoxicillin  Past History:  Past Medical History: CONTRACEPTIVE MANAGEMENT (ICD-V25.09) DYSMENORRHEA (ICD-625.3) SINUSITIS, ACUTE (ICD-461.9) BREAST PAIN, BILATERAL (ICD-611.71) FATIGUE (ICD-780.79) ROUTINE GYNECOLOGICAL EXAMINATION (ICD-V72.31) CANDIDIASIS (ICD-112.9) DEPRESSION/ANXIETY (ICD-300.4) HEADACHE (ICD-784.0) CHEST PAIN (ICD-786.50) PALPITATIONS (ICD-785.1) ABDOMINAL PAIN, EPIGASTRIC (ICD-789.06) HEALTH MAINTENANCE EXAM (ICD-V70.0) PAP SMEAR, ABNORMAL (ICD-795.00) OBESITY (ICD-278.00) GONORRHEA, ACUTE (ICD-098.0) GERD (ICD-530.81) ALLERGIC RHINITIS (ICD-477.9) ASTHMA (ICD-493.90)  Past Surgical History: Reviewed history from 03/25/2008 and no changes required. 1.  2008:  Marsupuliazation of Bartholin's cyst 2.  2000:  Conization of cervix.  Family History: Mother, 45:  hypertension, Panic disorder. Father, 22:  pt. not aware of health history--incarcerated for 2-3 years of 18 year prison term. 1 1/2 brother, 2, overweight--maternal 2 1/2 paternal brothers:  healthy 2 1/2 paternal sisters:  healthy Maternal grandmother--died in late 21s of heart  disease--pregnant with twins. Maternal aunt--40s--breast cancer.  Social History: Lives with boyfriend of 3 years no children G1SAB1--3 mos. Never Smoked Alcohol:  no Drug: never  Review of Systems General:  Energy--tired at times.. Eyes:  Denies blurring. ENT:  Denies decreased hearing. CV:  Denies chest pain or discomfort; still with occasional palpitations. Resp:  Denies shortness of breath; Rarely requires rescue inhaler.  Using meds regularly. Allergic rhinitis symptoms controlled as well.Marland Kitchen GI:  Denies bloody stools, constipation, dark tarry stools, and diarrhea; Has abdominal pain--chronic Strong gag reflex. GU:  Denies discharge, dysuria, and urinary frequency. MS:  Denies joint pain, joint redness, and joint swelling. Derm:  Denies lesion(s) and rash. Neuro:  Denies numbness, tingling, and weakness. Psych:  Denies anxiety, depression, and suicidal thoughts/plans; Scored 8 on PHQ9, but pt. really not .  Physical Exam  General:  Morbidly obese, NAD Head:  Normocephalic and atraumatic without obvious abnormalities. No apparent alopecia or balding. Eyes:  No corneal or conjunctival inflammation noted. EOMI. Perrla. Funduscopic exam benign, without hemorrhages, exudates or papilledema. Vision grossly normal. Ears:  External ear exam shows no significant lesions or deformities.  Otoscopic examination reveals clear canals, tympanic membranes are intact bilaterally without bulging, retraction, inflammation or discharge. Hearing is grossly normal bilaterally. Nose:  External nasal examination shows no deformity or inflammation. Nasal mucosa are pink and moist without lesions or exudates. Mouth:  Oral mucosa and oropharynx without lesions or exudates.  Teeth in good repair. Neck:  No deformities, masses, or tenderness noted. Breasts:  No mass, nodules, thickening, tenderness, bulging, retraction, inflamation, nipple discharge or skin changes noted.   Lungs:  Normal respiratory  effort, chest expands symmetrically. Lungs are clear  to auscultation, no crackles or wheezes. Heart:  Normal rate and regular rhythm. S1 and S2 normal without gallop, murmur, click, rub or other extra sounds. Abdomen:  Bowel sounds positive,abdomen soft and non-tender without masses, organomegaly or hernias noted. Genitalia:  Pelvic Exam:        External: normal female genitalia without lesions or masses        Vagina: normal without lesions or masses        Cervix: normal without lesions or masses        Adnexa: normal bimanual exam without masses or fullness        Uterus: normal by palpation        Pap smear: performed Msk:  No deformity or scoliosis noted of thoracic or lumbar spine.   Pulses:  R and L carotid,radial,femoral,dorsalis pedis and posterior tibial pulses are full and equal bilaterally Extremities:  No clubbing, cyanosis, edema, or deformity noted with normal full range of motion of all joints.   Neurologic:  No cranial nerve deficits noted. Station and gait are normal. Plantar reflexes are down-going bilaterally. DTRs are symmetrical throughout. Sensory, motor and coordinative functions appear intact. Skin:  Intact without suspicious lesions or rashes Cervical Nodes:  No lymphadenopathy noted Axillary Nodes:  No palpable lymphadenopathy Inguinal Nodes:  No significant adenopathy Psych:  Cognition and judgment appear intact. Alert and cooperative with normal attention span and concentration. No apparent delusions, illusions, hallucinations   Impression & Recommendations:  Problem # 1:  ROUTINE GYNECOLOGICAL EXAMINATION (ICD-V72.31)  Orders: KOH/ WET Mount [phone]) UA Dipstick w/o Micro (manual) ([address]) Pap Smear, Thin Prep ( Collection of) [phone]) T- GC Chlamydia ([address]) T-HIV Antibody  (Reflex) ([phone]) T-Syphilis Test (RPR) ([id number])  Problem # 2:  ASTHMA (ICD-493.90) Controlled Encouraged yearly flu vaccine Her updated medication list for this problem  includes:    Singulair 10 Mg Tabs (Montelukast sodium) .Marland Kitchen... Take 1 tablet by mouth once a day    Proventil 90 Mcg/act Aers (Albuterol) .Marland Kitchen... 1-2 puffs every 6 hours as needed for shortness of breath    Asmanex 60 Metered Doses 220 Mcg/inh Aepb (Mometasone furoate) .Marland Kitchen... 1 puffs twice daily for asthma  Problem # 3:  ALLERGIC RHINITIS (ICD-477.9) Controlled Her updated medication list for this problem includes:    Nasacort Aq 55 Mcg/act Aers (Triamcinolone acetonide(nasal)) .Marland Kitchen... 2 sprays in each nostril daily    Xyzal 5 Mg Tabs (Levocetirizine dihydrochloride) .Marland Kitchen... 1 tab by mouth every day as needed for allergies  Problem # 4:  INSOMNIA (ICD-780.52) INcrease Amitriptyline to 20 mg daily   Complete Medication List: 1)  Singulair 10 Mg Tabs (Montelukast sodium) .... Take 1 tablet by mouth once a day 2)  Proventil 90 Mcg/act Aers (Albuterol) .Marland Kitchen.. 1-2 puffs every 6 hours as needed for shortness of breath 3)  Nasacort Aq 55 Mcg/act Aers (Triamcinolone acetonide(nasal)) .... 2 sprays in each nostril daily 4)  Asmanex 60 Metered Doses 220 Mcg/inh Aepb (Mometasone furoate) .Marland Kitchen.. 1 puffs twice daily for asthma 5)  Ranitidine Hcl 150 Mg Tabs (Ranitidine hcl) .... 2 tabs by mouth daily 6)  Amitriptyline Hcl 10 Mg Tabs (Amitriptyline hcl) .... 2 tabs by mouth at bedtime for sleep 7)  Xyzal 5 Mg Tabs (Levocetirizine dihydrochloride) .Marland Kitchen.. 1 tab by mouth every day as needed for allergies  Other Orders: T-Comprehensive Metabolic Panel [phone])  PAP Screening:    Last PAP smear:  03/25/2008  Osteoporosis Risk Assessment:  Risk Factors for Fracture or Low Bone Density:   Smoking status:  never  Immunization & Chemoprophylaxis:    Tetanus vaccine: Historical  (04/24/2004)    Influenza vaccine: Fluvax 3+  (12/24/2007)    Pneumovax: Pneumovax  (02/23/2007)  Patient Instructions: 1)  Call for flu shot end of October. 2)  Call for CPP in 1 year with Dr. Delrae Alfred  Preventive Care  Screening  Prior Values:    Pap Smear:  NEGATIVE FOR INTRAEPITHELIAL LESIONS OR MALIGNANCY. (03/25/2008)    Last Tetanus Booster:  Historical (04/24/2004)    Last Flu Shot:  Fluvax 3+ (12/24/2007)    Last Pneumovax:  Pneumovax (02/23/2007)     LMP:  08/08/09--normal.   Has some discomfort in left adnexal area 2 weeks before and during period. SBE:  Once monthly--no changes. Osteoprevention:  Does like cheese and yogurt--does not get 4 servings daily.   Going to [facility] with 40 minute cardio.  2 laps in pool and resistance training, crunches, leg lifts.  Tries to do this at least twice.     Prescriptions: XYZAL 5 MG TABS (LEVOCETIRIZINE DIHYDROCHLORIDE) 1 tab by mouth every day as needed for allergies  #30 x 11   Entered and Authorized by:   Julieanne Manson MD   Signed by:   Julieanne Manson MD on 08/14/2009   Method used:   Faxed to ...       [facility] - Pharmac (retail)       [address] [place], [place]  [address]       Ph: [phone]       Fax: [phone]   RxID:   [id number] RANITIDINE HCL 150 MG TABS (RANITIDINE HCL) 2 tabs by mouth daily  #60 x 11   Entered and Authorized by:   Julieanne Manson MD   Signed by:   Julieanne Manson MD on 08/14/2009   Method used:   Faxed to ...       [facility] - Pharmac (retail)       [address] [place], [place]  [address]       Ph: [phone]       Fax: [phone]   RxID:   [id number] ASMANEX 60 METERED DOSES 220 MCG/INH  AEPB (MOMETASONE FUROATE) 1 puffs twice daily for asthma  #1 x 11   Entered and Authorized by:   Julieanne Manson MD   Signed by:   Julieanne Manson MD on 08/14/2009   Method used:   Faxed to ...       [facility] - Pharmac (retail)       [address] [place], [place]  [address]       Ph: [phone]       Fax: [phone]   RxID:   [id number] NASACORT AQ 55 MCG/ACT   AERS (TRIAMCINOLONE ACETONIDE(NASAL)) 2 sprays in each nostril daily  #1 x 11   Entered and Authorized by:   Julieanne Manson MD   Signed by:   Julieanne Manson MD on 08/14/2009   Method used:   Faxed to ...       [facility] - Pharmac (retail)       [address] [place], [place]  [address]       Ph: [phone]       Fax: [phone]   RxID:   [id number] SINGULAIR 10 MG  TABS (MONTELUKAST SODIUM) Take 1  tablet by mouth once a day  #30 x 11   Entered and Authorized by:   Julieanne Manson MD   Signed by:   Julieanne Manson MD on 08/14/2009   Method used:   Faxed to ...       [facility] - Pharmac (retail)       [address] [place], [place]  [address]       Ph: [phone]       Fax: [phone]   RxID:   [id number] AMITRIPTYLINE HCL 10 MG TABS (AMITRIPTYLINE HCL) 2 tabs by mouth at bedtime for sleep  #60 x 11   Entered and Authorized by:   Julieanne Manson MD   Signed by:   Julieanne Manson MD on 08/14/2009   Method used:   Faxed to ...       [facility] - Pharmac (retail)       [address] [place], [place]  [address]       Ph: [phone]       Fax: [phone]   RxID:   [id number]    Laboratory Results   Urine Tests    Routine Urinalysis   Glucose: negative   (Normal Range: Negative) Bilirubin: negative   (Normal Range: Negative) Ketone: negative   (Normal Range: Negative) Spec. Gravity: 1.020   (Normal Range: 1.003-1.035) Blood: negative   (Normal Range: Negative) pH: 6.0   (Normal Range: 5.0-8.0) Protein: negative   (Normal Range: Negative) Urobilinogen: negative   (Normal Range: 0-1) Nitrite: negative   (Normal Range: Negative) Leukocyte Esterace: negative   (Normal Range: Negative)      Wet Mount/KOH Source: vaginal WBC/hpf: 5-10 Bacteria/hpf: 2+ Clue cells/hpf: many  Positive whiff Yeast/hpf:  none Trichomonas/hpf: none Comments: no symptoms    Laboratory Results   Urine Tests    Routine Urinalysis   Glucose: negative   (Normal Range: Negative) Bilirubin: negative   (Normal Range: Negative) Ketone: negative   (Normal Range: Negative) Spec. Gravity: 1.020   (Normal Range: 1.003-1.035) Blood: negative   (Normal Range: Negative) pH: 6.0   (Normal Range: 5.0-8.0) Protein: negative   (Normal Range: Negative) Urobilinogen: negative   (Normal Range: 0-1) Nitrite: negative   (Normal Range: Negative) Leukocyte Esterace: negative   (Normal Range: Negative)      Wet Mount  Positive whiff Wet Mount KOH: Negative Comments: no symptoms   Appended Document: CPP///////KT  HIV results?   Clinical Lists Changes  Observations: Added new observation of HIVRAPIDRSLT: negative (08/19/2009 23:28)      Laboratory Results    Other Tests  Rapid HIV: negative

## 2010-02-23 NOTE — Progress Notes (Signed)
Summary: Having trouble sleeping  Phone Note Call from Patient   Summary of Call: IS HAVING TROUBLE SLEEPING //[facility]///[phone] ORANGE CARD EXPIRED OCT/  HAS APPT FOR RECERTIFATION 01/04/10/CAN NOT WAIT NEEDS SOMETHING TO HELP HER SLEEP ,VERY TIRED Initial call taken by: Arta Bruce,  December 09, 2009 2:57 PM  Follow-up for Phone Call        Has been going on for about a week and a half.  States she is unable to get her medications from [facility] pharmacy and that's what's preventing her from sleeping.  Is taking Tylenol PM for sleep in the meantime.  Advised to not watch TV in bed, exercise before going to bed, keep the same time for going to bed and waking.  Is aware of need to keep eligibility appointment. Follow-up by: Dutch Quint RN,  December 10, 2009 4:58 PM  Additional Follow-up for Phone Call Additional follow up Details #1::        I will send in Rx for Amitriptyline to [facility] to last until December, but she needs to call in and get an appt to continue the Rx. Additional Follow-up by: Julieanne Manson MD,  December 11, 2009 12:02 PM    Additional Follow-up for Phone Call Additional follow up Details #2::    Left message on answering machine for pt. to return call.  Dutch Quint RN  December 11, 2009 4:52 PM  Advised pt. of new Rx and provider's instructions -- will call back for appt. after she renews her eligibility.  Dutch Quint RN  December 15, 2009 12:06 PM   Prescriptions: AMITRIPTYLINE HCL 10 MG TABS (AMITRIPTYLINE HCL) 2 tabs by mouth at bedtime for sleep  #60 x 2   Entered and Authorized by:   Julieanne Manson MD   Signed by:   Julieanne Manson MD on 12/11/2009   Method used:   Electronically to        [facility] [phone]* (retail)       [address]       [place], [place]  [address]       Ph: [phone]       Fax: [phone]   RxID:   [id number]

## 2010-02-25 NOTE — Assessment & Plan Note (Signed)
Summary: FLU SHOT  / NS  Nurse Visit   Allergies: 1)  ! Doxy-Caps 2)  ! Amoxicillin  Immunizations Administered:  Influenza Vaccine # 1:    Vaccine Type: Fluvax 3+    Site: left deltoid    Mfr: GlaxoSmithKline    Dose: 0.5 ml    Route: IM    Given by: Dutch Quint RN    Exp. Date: 07/24/2010    Lot #: [id number]    VIS given: 08/18/09 version given February 09, 2010.  Flu Vaccine Consent Questions:    Do you have a history of severe allergic reactions to this vaccine? no    Any prior history of allergic reactions to egg and/or gelatin? no    Do you have a sensitivity to the preservative Thimersol? no    Do you have a past history of Guillan-Barre Syndrome? no    Do you currently have an acute febrile illness? no    Have you ever had a severe reaction to latex? no    Vaccine information given and explained to patient? yes    Are you currently pregnant? no  Orders Added: 1)  Flu Vaccine 88yrs + [90658] 2)  Admin 1st Vaccine [[address]]

## 2010-03-11 ENCOUNTER — Emergency Department (HOSPITAL_COMMUNITY)
Admission: EM | Admit: 2010-03-11 | Discharge: 2010-03-11 | Disposition: A | Discharge status: Home / Self Care | Payer: Self-pay | Attending: Emergency Medicine | Admitting: Emergency Medicine

## 2010-03-11 DIAGNOSIS — R05 Cough: Secondary | ICD-10-CM | POA: Insufficient documentation

## 2010-03-11 DIAGNOSIS — R059 Cough, unspecified: Secondary | ICD-10-CM | POA: Insufficient documentation

## 2010-03-11 DIAGNOSIS — J9801 Acute bronchospasm: Secondary | ICD-10-CM | POA: Insufficient documentation

## 2010-04-08 LAB — URINALYSIS, ROUTINE W REFLEX MICROSCOPIC
Glucose, UA: NEGATIVE mg/dL
Hgb urine dipstick: NEGATIVE
Ketones, ur: NEGATIVE mg/dL
Protein, ur: NEGATIVE mg/dL
pH: 5.5 (ref 5.0–8.0)

## 2010-04-08 LAB — WET PREP, GENITAL

## 2010-04-08 LAB — GC/CHLAMYDIA PROBE AMP, GENITAL: Chlamydia, DNA Probe: NEGATIVE

## 2010-04-13 LAB — POCT PREGNANCY, URINE: Preg Test, Ur: NEGATIVE

## 2010-04-13 LAB — URINALYSIS, ROUTINE W REFLEX MICROSCOPIC
Bilirubin Urine: NEGATIVE
Nitrite: NEGATIVE
Specific Gravity, Urine: 1.012 (ref 1.005–1.030)
Urobilinogen, UA: 0.2 mg/dL (ref 0.0–1.0)
pH: 6.5 (ref 5.0–8.0)

## 2010-04-13 LAB — DIFFERENTIAL
Basophils Absolute: 0 10*3/uL (ref 0.0–0.1)
Eosinophils Absolute: 0.3 10*3/uL (ref 0.0–0.7)
Eosinophils Relative: 2 % (ref 0–5)
Lymphocytes Relative: 14 % (ref 12–46)
Monocytes Absolute: 0.9 10*3/uL (ref 0.1–1.0)

## 2010-04-13 LAB — WET PREP, GENITAL: WBC, Wet Prep HPF POC: NONE SEEN

## 2010-04-13 LAB — COMPREHENSIVE METABOLIC PANEL
ALT: 17 U/L (ref 0–35)
AST: 20 U/L (ref 0–37)
Albumin: 3.6 g/dL (ref 3.5–5.2)
CO2: 29 mEq/L (ref 19–32)
Chloride: 103 mEq/L (ref 96–112)
Creatinine, Ser: 0.7 mg/dL (ref 0.4–1.2)
GFR calc Af Amer: >60 mL/min (ref >60)
GFR calc non Af Amer: >60 mL/min (ref >60)
Potassium: 3.8 mEq/L (ref 3.5–5.1)
Sodium: 138 mEq/L (ref 135–145)
Total Bilirubin: 0.5 mg/dL (ref 0.3–1.2)

## 2010-04-13 LAB — CBC
MCV: 83.7 fL (ref 78.0–100.0)
Platelets: 209 10*3/uL (ref 150–400)
RBC: 4.51 MIL/uL (ref 3.87–5.11)
WBC: 11.1 10*3/uL — ABNORMAL HIGH (ref 4.0–10.5)

## 2010-04-13 LAB — GC/CHLAMYDIA PROBE AMP, GENITAL: Chlamydia, DNA Probe: NEGATIVE

## 2010-04-14 LAB — COMPREHENSIVE METABOLIC PANEL
ALT: 19 U/L (ref 0–35)
AST: 21 U/L (ref 0–37)
Albumin: 3.9 g/dL (ref 3.5–5.2)
Alkaline Phosphatase: 56 U/L (ref 39–117)
CO2: 25 mEq/L (ref 19–32)
Chloride: 105 mEq/L (ref 96–112)
GFR calc Af Amer: >60 mL/min (ref >60)
Potassium: 3.7 mEq/L (ref 3.5–5.1)
Total Bilirubin: 0.6 mg/dL (ref 0.3–1.2)

## 2010-04-14 LAB — WET PREP, GENITAL: Yeast Wet Prep HPF POC: NONE SEEN

## 2010-04-14 LAB — CBC
HCT: 38.4 % (ref 36.0–46.0)
Platelets: 221 10*3/uL (ref 150–400)
RBC: 4.59 MIL/uL (ref 3.87–5.11)
WBC: 9.8 10*3/uL (ref 4.0–10.5)

## 2010-04-14 LAB — URINALYSIS, ROUTINE W REFLEX MICROSCOPIC
Bilirubin Urine: NEGATIVE
Hgb urine dipstick: NEGATIVE
Ketones, ur: NEGATIVE mg/dL
Protein, ur: NEGATIVE mg/dL
Specific Gravity, Urine: 1.016 (ref 1.005–1.030)
Urobilinogen, UA: 0.2 mg/dL (ref 0.0–1.0)

## 2010-04-14 LAB — DIFFERENTIAL
Basophils Absolute: 0 10*3/uL (ref 0.0–0.1)
Basophils Relative: 0 % (ref 0–1)
Eosinophils Absolute: 0.1 10*3/uL (ref 0.0–0.7)
Eosinophils Relative: 1 % (ref 0–5)
Monocytes Absolute: 0.8 10*3/uL (ref 0.1–1.0)

## 2010-04-14 LAB — GC/CHLAMYDIA PROBE AMP, GENITAL
Chlamydia, DNA Probe: NEGATIVE
GC Probe Amp, Genital: NEGATIVE

## 2010-04-20 ENCOUNTER — Emergency Department (HOSPITAL_COMMUNITY)
Admission: EM | Admit: 2010-04-20 | Discharge: 2010-04-21 | Disposition: A | Discharge status: Home / Self Care | Payer: Self-pay | Attending: Emergency Medicine | Admitting: Emergency Medicine

## 2010-04-20 DIAGNOSIS — R51 Headache: Secondary | ICD-10-CM | POA: Insufficient documentation

## 2010-04-20 DIAGNOSIS — R112 Nausea with vomiting, unspecified: Secondary | ICD-10-CM | POA: Insufficient documentation

## 2010-04-20 DIAGNOSIS — K5900 Constipation, unspecified: Secondary | ICD-10-CM | POA: Insufficient documentation

## 2010-04-20 DIAGNOSIS — K219 Gastro-esophageal reflux disease without esophagitis: Secondary | ICD-10-CM | POA: Insufficient documentation

## 2010-04-20 DIAGNOSIS — J45909 Unspecified asthma, uncomplicated: Secondary | ICD-10-CM | POA: Insufficient documentation

## 2010-04-20 DIAGNOSIS — H53149 Visual discomfort, unspecified: Secondary | ICD-10-CM | POA: Insufficient documentation

## 2010-04-20 DIAGNOSIS — K2970 Gastritis, unspecified, without bleeding: Secondary | ICD-10-CM | POA: Insufficient documentation

## 2010-04-26 LAB — CBC
HCT: 42.9 % (ref 36.0–46.0)
MCHC: 33.5 g/dL (ref 30.0–36.0)
MCV: 83.4 fL (ref 78.0–100.0)
Platelets: 161 10*3/uL (ref 150–400)
Platelets: 222 10*3/uL (ref 150–400)
RDW: 15.7 % — ABNORMAL HIGH (ref 11.5–15.5)

## 2010-04-26 LAB — BASIC METABOLIC PANEL
BUN: 8 mg/dL (ref 6–23)
CO2: 27 mEq/L (ref 19–32)
Chloride: 109 mEq/L (ref 96–112)
Creatinine, Ser: 0.67 mg/dL (ref 0.4–1.2)
Glucose, Bld: 106 mg/dL — ABNORMAL HIGH (ref 70–99)

## 2010-04-26 LAB — DIFFERENTIAL
Basophils Absolute: 0 10*3/uL (ref 0.0–0.1)
Basophils Relative: 0 % (ref 0–1)
Basophils Relative: 1 % (ref 0–1)
Eosinophils Absolute: 0.1 10*3/uL (ref 0.0–0.7)
Eosinophils Absolute: 0.1 10*3/uL (ref 0.0–0.7)
Eosinophils Relative: 1 % (ref 0–5)
Monocytes Absolute: 0.2 10*3/uL (ref 0.1–1.0)
Monocytes Relative: 2 % — ABNORMAL LOW (ref 3–12)
Monocytes Relative: 9 % (ref 3–12)
Neutro Abs: 8.9 10*3/uL — ABNORMAL HIGH (ref 1.7–7.7)
Neutrophils Relative %: 68 % (ref 43–77)

## 2010-04-26 LAB — URINALYSIS, ROUTINE W REFLEX MICROSCOPIC
Glucose, UA: NEGATIVE mg/dL
Hgb urine dipstick: NEGATIVE
Ketones, ur: NEGATIVE mg/dL
Protein, ur: NEGATIVE mg/dL
Protein, ur: NEGATIVE mg/dL
Urobilinogen, UA: 0.2 mg/dL (ref 0.0–1.0)

## 2010-04-26 LAB — COMPREHENSIVE METABOLIC PANEL
ALT: 36 U/L — ABNORMAL HIGH (ref 0–35)
AST: 30 U/L (ref 0–37)
Albumin: 4.3 g/dL (ref 3.5–5.2)
Alkaline Phosphatase: 72 U/L (ref 39–117)
BUN: 9 mg/dL (ref 6–23)
Chloride: 107 mEq/L (ref 96–112)
Potassium: 4.1 mEq/L (ref 3.5–5.1)
Sodium: 138 mEq/L (ref 135–145)
Total Bilirubin: 0.3 mg/dL (ref 0.3–1.2)
Total Protein: 8.5 g/dL — ABNORMAL HIGH (ref 6.0–8.3)

## 2010-04-26 LAB — URINE MICROSCOPIC-ADD ON

## 2010-05-01 LAB — DIFFERENTIAL
Eosinophils Absolute: 0.2 10*3/uL (ref 0.0–0.7)
Eosinophils Relative: 2 % (ref 0–5)
Lymphs Abs: 2.5 10*3/uL (ref 0.7–4.0)
Monocytes Absolute: 0.7 10*3/uL (ref 0.1–1.0)
Monocytes Relative: 6 % (ref 3–12)

## 2010-05-01 LAB — URINALYSIS, ROUTINE W REFLEX MICROSCOPIC
Bilirubin Urine: NEGATIVE
Glucose, UA: NEGATIVE mg/dL
Hgb urine dipstick: NEGATIVE
Ketones, ur: NEGATIVE mg/dL
Specific Gravity, Urine: 1.016 (ref 1.005–1.030)
pH: 6.5 (ref 5.0–8.0)

## 2010-05-01 LAB — COMPREHENSIVE METABOLIC PANEL
ALT: 20 U/L (ref 0–35)
AST: 22 U/L (ref 0–37)
Albumin: 3.5 g/dL (ref 3.5–5.2)
CO2: 27 mEq/L (ref 19–32)
Calcium: 8.9 mg/dL (ref 8.4–10.5)
GFR calc Af Amer: >60 mL/min (ref >60)
GFR calc non Af Amer: >60 mL/min (ref >60)
Sodium: 139 mEq/L (ref 135–145)

## 2010-05-01 LAB — CBC
MCHC: 31.6 g/dL (ref 30.0–36.0)
Platelets: 195 10*3/uL (ref 150–400)
RBC: 4.42 MIL/uL (ref 3.87–5.11)
WBC: 11 10*3/uL — ABNORMAL HIGH (ref 4.0–10.5)

## 2010-05-01 LAB — LIPASE, BLOOD: Lipase: 36 U/L (ref 11–59)

## 2010-05-02 LAB — URINALYSIS, ROUTINE W REFLEX MICROSCOPIC
Glucose, UA: NEGATIVE mg/dL
Hgb urine dipstick: NEGATIVE
Specific Gravity, Urine: 1.01 (ref 1.005–1.030)
pH: 6.5 (ref 5.0–8.0)

## 2010-05-02 LAB — POCT PREGNANCY, URINE: Preg Test, Ur: NEGATIVE

## 2010-05-02 LAB — COMPREHENSIVE METABOLIC PANEL
ALT: 25 U/L (ref 0–35)
AST: 24 U/L (ref 0–37)
CO2: 26 mEq/L (ref 19–32)
Calcium: 8.9 mg/dL (ref 8.4–10.5)
Chloride: 103 mEq/L (ref 96–112)
Creatinine, Ser: 0.75 mg/dL (ref 0.4–1.2)
GFR calc Af Amer: >60 mL/min (ref >60)
GFR calc non Af Amer: >60 mL/min (ref >60)
Glucose, Bld: 82 mg/dL (ref 70–99)
Total Bilirubin: 0.5 mg/dL (ref 0.3–1.2)

## 2010-05-02 LAB — GC/CHLAMYDIA PROBE AMP, GENITAL: GC Probe Amp, Genital: NEGATIVE

## 2010-05-02 LAB — WET PREP, GENITAL
Trich, Wet Prep: NONE SEEN
Yeast Wet Prep HPF POC: NONE SEEN

## 2010-05-05 LAB — POCT PREGNANCY, URINE: Preg Test, Ur: NEGATIVE

## 2010-05-06 LAB — LIPASE, BLOOD: Lipase: 31 U/L (ref 11–59)

## 2010-05-06 LAB — HEPATIC FUNCTION PANEL
ALT: 20 U/L (ref 0–35)
AST: 22 U/L (ref 0–37)
Albumin: 4.2 g/dL (ref 3.5–5.2)
Alkaline Phosphatase: 65 U/L (ref 39–117)
Bilirubin, Direct: 0.1 mg/dL (ref 0.0–0.3)
Indirect Bilirubin: 0.5 mg/dL (ref 0.3–0.9)
Total Bilirubin: 0.6 mg/dL (ref 0.3–1.2)
Total Protein: 7.9 g/dL (ref 6.0–8.3)

## 2010-05-06 LAB — DIFFERENTIAL
Basophils Absolute: 0 10*3/uL (ref 0.0–0.1)
Basophils Relative: 0 % (ref 0–1)
Lymphocytes Relative: 6 % — ABNORMAL LOW (ref 12–46)
Neutro Abs: 13.8 10*3/uL — ABNORMAL HIGH (ref 1.7–7.7)
Neutrophils Relative %: 88 % — ABNORMAL HIGH (ref 43–77)

## 2010-05-06 LAB — POCT I-STAT, CHEM 8
BUN: 15 mg/dL (ref 6–23)
Calcium, Ion: 1.2 mmol/L (ref 1.12–1.32)
Chloride: 107 meq/L (ref 96–112)
Creatinine, Ser: 0.8 mg/dL (ref 0.4–1.2)
Glucose, Bld: 98 mg/dL (ref 70–99)
HCT: 47 % — ABNORMAL HIGH (ref 36.0–46.0)
Hemoglobin: 16 g/dL — ABNORMAL HIGH (ref 12.0–15.0)
Potassium: 4 meq/L (ref 3.5–5.1)
Sodium: 141 mEq/L (ref 135–145)
TCO2: 23 mmol/L (ref 0–100)

## 2010-05-06 LAB — URINALYSIS, ROUTINE W REFLEX MICROSCOPIC
Leukocytes, UA: NEGATIVE
Nitrite: NEGATIVE
Specific Gravity, Urine: 1.02 (ref 1.005–1.030)
Urobilinogen, UA: 0.2 mg/dL (ref 0.0–1.0)
pH: 5.5 (ref 5.0–8.0)

## 2010-05-06 LAB — URINE MICROSCOPIC-ADD ON

## 2010-05-06 LAB — CBC
Hemoglobin: 14.5 g/dL (ref 12.0–15.0)
MCHC: 33.8 g/dL (ref 30.0–36.0)
RDW: 15.1 % (ref 11.5–15.5)

## 2010-05-13 ENCOUNTER — Ambulatory Visit: Payer: Self-pay | Admitting: Obstetrics and Gynecology

## 2010-05-13 ENCOUNTER — Other Ambulatory Visit: Payer: Self-pay | Admitting: Obstetrics and Gynecology

## 2010-05-13 DIAGNOSIS — Z01419 Encounter for gynecological examination (general) (routine) without abnormal findings: Secondary | ICD-10-CM

## 2010-05-13 DIAGNOSIS — Z124 Encounter for screening for malignant neoplasm of cervix: Secondary | ICD-10-CM

## 2010-05-14 NOTE — Group Therapy Note (Signed)
[name], DANG NO.:  [id number]  MEDICAL RECORD NO.:  [id number]           PATIENT TYPE:  A  LOCATION:  [facility]                   FACILITY:  [facility]  PHYSICIAN:  Argentina Donovan, MD        DATE OF BIRTH:  21-Aug-1980  DATE OF SERVICE:  05/13/2010                                 CLINIC NOTE  HISTORY OF PRESENT ILLNESS:  The patient is a 30 year old African American female in for STD testing and a Pap smear.  Her past history includes a LEEP procedure that was done by Dr. Ambrose Mantle.  She said she has had followup Pap smears here.  I have no record of any of those.  We did a Pap smear today.  She has had recurrent pelvic pain.  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  I told we would put her on birth control pills to try and correct that.  However, she was trying to get pregnant at time she was juvenile that problem so she wanted to go on the pill and was placed on  Sprintec.  PHYSICAL EXAMINATION:  ABDOMEN:  Soft, flat, and nontender.  No masses or organomegaly.  External genitalia is normal.  BUS is within normal limits.  Vagina is clean and well rugated.  Cervix clean, parous with a positive whiff.  The uterus anterior, normal size, shape, consistency. The adnexa could not be well outlined.  IMPRESSION:  Normal exam with the possible bacterial vaginitis.  GC, Chlamydia, and wet prep pending as well as lab tests for STDs.          ______________________________ Argentina Donovan, MD    PR/MEDQ  D:  05/13/2010  T:  05/14/2010  Job:  [id number]

## 2010-06-04 ENCOUNTER — Emergency Department (HOSPITAL_COMMUNITY)
Admission: EM | Admit: 2010-06-04 | Discharge: 2010-06-04 | Disposition: A | Discharge status: Home / Self Care | Payer: Self-pay | Attending: Emergency Medicine | Admitting: Emergency Medicine

## 2010-06-04 DIAGNOSIS — R110 Nausea: Secondary | ICD-10-CM | POA: Insufficient documentation

## 2010-06-04 DIAGNOSIS — H53149 Visual discomfort, unspecified: Secondary | ICD-10-CM | POA: Insufficient documentation

## 2010-06-04 DIAGNOSIS — R51 Headache: Secondary | ICD-10-CM | POA: Insufficient documentation

## 2010-06-11 NOTE — Group Therapy Note (Signed)
[name], [name] NO.:  [id number]   MEDICAL RECORD NO.:  [id number]          PATIENT TYPE:  WOC   LOCATION:  [facility]                   [facility]ACILITY:  [facility]   PHYSICIAN:  Argentina Donovan, MD        DATE OF BIRTH:  Nov 04, 1980   DATE OF SERVICE:  01/08/2004                                    CLINIC NOTE   REASON FOR VISIT:  The patient is a 30 year old black female gravida 0 with  a weight of 255 pounds who is in for a repeat Pap smear because of an  atypical smear.  In addition, the patient has recurrent furunculosis,  especially around the interior part of the thighs and the perineum, and has  been placed on amoxicillin for a leg lesion that may be MRSA.  Since that  time, she developed a vulvar and vaginal itching/burning and a heavy cottage  cheese white discharge.  In addition to this, the patient has chronic  abdominal pain often in the lower pelvis, but just as often probably in the  upper.  Her boyfriend states that when she gets this pain he can feel a  bulge or mass in the abdomen.  My feeling on this is she probably has  spastic colon.   PHYSICAL EXAMINATION:  This is an obese female with an abdomen that is soft,  flat, nontender.  No masses, no organomegaly palpated.  External genitalia  is normal.  Healing furuncles on the inner portion of both thighs noted, as  well as a weeping lesion on her right lower calf.  BUS within normal limits,  the vagina is well rugated with heavy, thick, white leukorrhea and a closed,  nulliparous cervix.  The uterus and adnexa could not be palpated because of  the habitus of the patient, but the motion of the cervix elicited no pain.   The plan is to screen this patient with a hemoglobin A1c, to treat the yeast  infection the patient has using Diflucan p.r.n. and Terazol cream.  She will  continue on her amoxicillin which was prescribed by another physician for  her leg lesion, and because of the vague history of pelvic  pain associated  with this abdominal pain, I am going to request a pelvic ultrasound.   IMPRESSION:  1.  Recurrent furunculosis.  2.  Monilial vaginitis.  3.  Abdominal pain, recurrent.  4.  Rule out diabetes mellitus.      PR/MEDQ  D:  01/08/2004  T:  01/08/2004  Job:  [id number]

## 2010-06-11 NOTE — Group Therapy Note (Signed)
   Amanda [name], TALBOT NO.:  [id number]   MEDICAL RECORD NO.:  [id number]                   PATIENT TYPE:  OUT   LOCATION:  [facility]                           [facility]ACILITY:  [facility]   PHYSICIAN:  Argentina Donovan, MD                     DATE OF BIRTH:  Nov 25, 1980   DATE OF SERVICE:                                    CLINIC NOTE   CHIEF COMPLAINT:  The patient is a 30 year old nulligravida black female;  last menstrual period July 24, 2002; who has had a history of cervical  conization because of severe dysplasia.  She had a colposcopy in March 2004  with a follow-up with showed a slight dysplasia CIN 1 and was to be followed  with Pap smears.  The patient is morbidly obese - 265.9 pounds - and has a  normal blood pressure of 115/72.  She came in desiring oral contraceptives.  We have talked to her about the alternatives and we have prescribed Levlite  x12.  The patient's other complaint is epigastric pain that progresses up  into her chest with deep pain every morning and symptoms of reflux.  We told  her it is probably related to her weight, that we suggested that she take  Prevacid or Pravachol every day, and if that does not work we would send her  to a gastroenterologist.  The patient also desired to be screened for  sexually transmitted disease which was done when we did the Pap smear today.   IMPRESSION:  1. History of cervical dysplasia.  2. Epigastric pain, probably gastroesophageal reflux disease.  3. History of cervical conization.  4. Oral contraceptive started.                                               Argentina Donovan, MD    PR/MEDQ  D:  09/03/2002  T:  09/03/2002  Job:  [id number]

## 2010-06-11 NOTE — Group Therapy Note (Signed)
   Amanda [name], Amanda [name] NO.:  [id number]   MEDICAL RECORD NO.:  [id number]                   PATIENT TYPE:  OUT   LOCATION:  [facility]                           [facility]ACILITY[facility]  [facility]   PHYSICIAN:  Argentina Donovan, MD                     DATE OF BIRTH:  [birth year]   DATE OF SERVICE:                                    CLINIC NOTE   CHIEF COMPLAINT:  The patient is a 30 year old para 0 female with unknown  last menstrual period who presents for repeat gonorrhea culture secondary to  equivocal results on September 03, 2002.  The patient denies any history of  discharge or contact with the disease.  The patient also has a history of an  abnormal Pap smear CIN 1 earlier this year with a negative Pap smear in  August 2004 as well.  The patient uses condoms for protection with STDs and  as birth control.  The patient states she uses condoms at every encounter.  Also, the patient has a complaint of left arm pain secondary to an IV the  patient received at [facility] for food poisoning.  The patient was not  admitted; it was only in during the ER.  The patient denies fever or red  streaks.   GC culture was done and a UCG is negative.  The patient is to return in  December for a Pap smear and if menses is not resumed by then we will do a  workup.  The patient is also encouraged to continue using condoms but to  also start taking the OCPs that were prescribed for her in August.  As the  left arm appears benign and there is no swelling, no redness, no bruising,  the patient encouraged to make follow-up appointment with family practice if  this does not resolve in a week.  The patient, again, is to return to this  clinic in December.     Elsie Lincoln, MD                         Argentina Donovan, MD    KL/MEDQ  D:  10/08/2002  T:  10/08/2002  Job:  [id number]

## 2010-06-11 NOTE — Op Note (Signed)
Amanda [name], Amanda [name]       ACCOUNT NO.:  [id number]   MEDICAL RECORD NO.:  [id number]          PATIENT TYPE:  AMB   LOCATION:  [facility]                           [facility]   PHYSICIAN:  Malachi Pro. Ambrose Mantle, M.D. DATE OF BIRTH:  October 05, 1980   DATE OF PROCEDURE:  11/01/2005  DATE OF DISCHARGE:                                 OPERATIVE REPORT   PREOPERATIVE DIAGNOSIS:  Anembryonic pregnancy.   POSTOPERATIVE DIAGNOSIS:  Anembryonic pregnancy.   OPERATION:  Suction D&C.   OPERATOR:  Malachi Pro. Ambrose Mantle, M.D.   ANESTHESIA:  General anesthesia.   The patient brought to the operating room and given mask anesthesia.  She  was placed in lithotomy position.  The vulva, vagina and urethra were  prepped with Betadine solution.  The bladder was emptied with a [place]  catheter.  Exam revealed the uterus to be anterior approximately 9 weeks  size.  The adnexa were free of masses.  The cervix was exposed and drawn  into the operative field.  It sounded to 9.5 cm anteriorly.  It was then the  cervix was then dilated to a 27 Pratt dilator.  The #8 curved suction  curette entered the endometrial cavity and a suction D&C was performed.  A  sharp D&C was then done to assure that the walls of the cavity felt smooth  and then another circuit with the suction was done.  The procedure was  terminated.  Blood loss was estimated less than 50 mL.  Sponge and needle  counts were correct and the patient was returned to recovery in satisfactory  condition.      Malachi Pro. Ambrose Mantle, M.D.  Electronically Signed     TFH/MEDQ  D:  11/01/2005  T:  11/02/2005  Job:  [id number]

## 2010-06-11 NOTE — Discharge Summary (Signed)
Amanda [name], Amanda [name]                   ACCOUNT NO.:  [id number]   MEDICAL RECORD NO.:  [id number]                   PATIENT TYPE:  INP   LOCATION:  5736                                 FACILITY:  [facility]   PHYSICIAN:  Lonia Blood, M.D.                    DATE OF BIRTH:  [birth year]   DATE OF ADMISSION:  09/27/2002  DATE OF DISCHARGE:  09/28/2002                                 DISCHARGE SUMMARY   ATTENDING PHYSICIAN:  Madaline Guthrie, M.D.   CONTINUITY PHYSICIAN:  Continuity doctor will be Dante Gang, M.D.   PRIMARY CARE PHYSICIAN:  Unknown.   DISCHARGE DIAGNOSES:  1. Gastroesophageal reflux disease.  2. History of cervical dysplasia.  3. Abdominal pain of unknown etiology with nausea and vomiting.   MEDICATIONS:  1. Phenergan 25 mg p.o. t.i.d. PRN for nausea.  2. Prilosec OTC 20 mg p.o. daily.   CONDITION ON DISCHARGE:  Good.   FOLLOW UP:  The patient will follow up in the continuity clinic with Dr.  Alfonse Alpers on November 14, 2002 at 9:00 A.M.  She will have follow up on her  positive Helicobacter pylori test.   HISTORY OF PRESENT ILLNESS:  The patient is a 30 year old female with  history of gastroesophageal reflux disease presenting with a one day history  of vomiting and hematemesis. She states that she awoke the morning of  admission, had a bowel movement with subsequent diarrhea followed by nausea  and vomiting which was tinged with blood.  Vomitus was not bilious or coffee-  ground in consistency.  She did report having chicken nuggets to eat the day  before. She has had normal bowel movements recently but mild abdominal pain  for the past few months.   LABORATORY DATA:  On admission the sodium was 139, potassium 4.1, chloride  104, bicarb 25, BUN 13 and creatinine 0.6.  Her calcium was 9.4.  Her white  count was 13.  Her hemoglobin was 14, hematocrit 42, platelet count 208.  She had a urinalysis which was negative for all except trace leukocyte  esterase,  rare bacteria and 0-2 white blood cells.  She had a negative urine  pregnancy test. Her lipase was 29, albumin was 4.1, bilirubin was 0.5,  alkaline phosphatase 67, SGOT 23,  SGPT 27.   HOSPITAL COURSE:  PROBLEM #1:  ABDOMINAL PAIN:  The patient was examined in  the emergency room and had an abdominal ultrasound which was negative for  gallstones or signs of cholecystitis.  Her initial examination was  concerning for possible appendicitis and the patient had an abdominal CT  scan which showed no acute process.  The patient was admitted to the floor  and was started on Protonix and Phenergan and had an intravenous with  fluids.  Overnight the patient had no continued vomiting and in the morning  was improved and able to take p.o. foods.  She had a urine culture which was  pending at the time of discharge, a GC and Chlamydia which is pending at the  time of discharge, H-pylori screen and antibody which were both pending and  a urine drug screen which was positive for marijuana use only.   PROBLEM #2:  GASTROESOPHAGEAL REFLUX DISEASE:  The patient did not have  complaints of gastroesophageal reflux disease symptoms during her  hospitalization but did report having pain in her epigastrium and had a  history of gastroesophageal reflux disease symptoms so she was placed on  Protonix while in the hospital.  She was discharged on Prilosec OTC.   DISCHARGE LABORATORY DATA:  On discharge the patient's most recent labs were  those of her admission.  Subsequent to discharge she was found to have a  positive H-pylori screen, negative GC and Chlamydia.  These will be followed  up in clinic in October.                                                Lonia Blood, M.D.    SL/MEDQ  D:  10/02/2002  T:  10/03/2002  Job:  [id number]

## 2010-06-11 NOTE — Op Note (Signed)
Amanda [name], Amanda [name]       ACCOUNT NO.:  [id number]   MEDICAL RECORD NO.:  [id number]          PATIENT TYPE:  AMB   LOCATION:  DAY                          FACILITY:  [facility]   PHYSICIAN:  Malachi Pro. Ambrose Mantle, M.D. DATE OF BIRTH:  1980/09/15   DATE OF PROCEDURE:  01/19/2006  DATE OF DISCHARGE:                               OPERATIVE REPORT   PREOPERATIVE DIAGNOSIS:  Recurrent left Bartholin abscess.  The patient  had had it drained on three occasions elsewhere.   POSTOPERATIVE DIAGNOSIS:  Left Bartholin hematoma, possibly infected.   OPERATION:  Marsupialization of the left Bartholin hematoma/abscess.   OPERATOR:  Henley.   General anesthesia.   The patient was brought to the operating room and placed under  satisfactory general anesthesia.  The vulva, vagina and perineum were  prepped with Betadine solution and draped as a sterile field with the  patient in the lithotomy position.  The enlarged left Bartholin gland  was elevated into the operative field with my fingers and thumb, and I  could see where there was an indentation where the gland was trying to  drain.  I incised this with a 15 blade, and dark bloody material shot  out under pressure. I identified that it was a single cavity with my  pinky finger and then identified all the abscess wall and, with a  running locked suture of 2-0 Vicryl, started at 12 o'clock and went  around all the way back to 12 and tied it down.  There were a couple  areas where the skin was not approximated to the abscess wall, and I  sutured this.  There was significant bleeding at the end of procedure,  and the procedure was terminated.  Blood loss was less than 10 mL.  Sponge and needle counts were correct.  The patient was returned to  recovery in satisfactory condition.      Malachi Pro. Ambrose Mantle, M.D.  Electronically Signed     TFH/MEDQ  D:  01/19/2006  T:  01/19/2006  Job:  [id number]

## 2010-06-11 NOTE — Group Therapy Note (Signed)
Amanda [name], GALLAHER NO.:  [id number]   MEDICAL RECORD NO.:  [id number]          PATIENT TYPE:  WOC   [facility]ATION[facility]  [facility]                   [facility]ILITY[facility]  [facility]   PHYSICIAN:  Elsie Lincoln, MD      DATE OF BIRTH:  August 03, 1980   DATE OF SERVICE:  09/14/2005                                    CLINIC NOTE   WOMEN'S CLINIC NOTE   DATE OF VISIT:  September 14, 2005   SUBJECTIVE:  The patient is a 30 year old female who presents for followup  visit to the [facility].  The patient now has had three Bartholin's cysts.  Her one  before this was in June, 2007.  She presented on September 06, 2005 with a  Bartholin's gland cyst/abscess that was not ready to be lanced.  With two  days of Sitz baths and taking Keflex, it spontaneously ruptured and is now  completely resolved.  Of note, the patient did have a positive pregnancy  test.  She does not have any problems with pain or bleeding, however, she  does need to seek prenatal care.  She was taking a friends prenatal vitamins  but stopped.  We have given her a sample of [facility] and a prescription.  [facility]  She may  also possibly go to [facility] for her care.  Also the patient  had a low grade SIL in June, 2007 and needs followup colposcopy.  The  patient still wants to follow this up with Dr. Ambrose Mantle once she gets her  pregnancy Medicaid.  She has a history of a LEEP with Dr. Ambrose Mantle when she  was 31 or 61; I do not have any records of this so I am unsure of what  severity the patient had on her cervix.  Also, since she has had a  conization she does need to be followed for cervical length and signs of  preterm labor, but again, the patient needs to go for eligibility before any  of this is done.  Today, I am ordering her ultrasound for viability and  dating and giving her the prenatal vitamins as described above.  Her  Bartholin's abscess is resolved.     ______________________________  Elsie Lincoln, MD     KL/MEDQ  D:  09/14/2005  T:  09/15/2005  Job:  [id number]

## 2010-06-11 NOTE — Op Note (Signed)
[facility]  Patient:    Amanda [name], Amanda [name]                MRN: [id number] Proc. Date: 06/04/99 Adm. Date:  [id number] Disc. 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  OPERATION:  Dilatation and curettage; conization.  OPERATOR:  Malachi Pro. Ambrose Mantle, M.D.  ANESTHESIA:  General anesthesia.  DESCRIPTION OF PROCEDURE:  The patient was brought to the operating room and placed under satisfactory general anesthesia and was placed in the lithotomy position.  The vulva was prepped with Betadine solution.  The uterus was posterior and normal size.  The adnexa was free of masses.  Cervix was exposed with a speculum after the area was draped as a sterile field with wet towels. The cervix was liberally painted with a 5% solution of acetic acid.  White epithelium was seen in all four quadrants of the cervix.  There was a small tongue of white tissue at about 4 oclock, farther out on the cervix than the solid lesion.  Squamocolumnar junction was visible.  I outlined the cone with the laser and then using the superpulse, did a cone, trying to do more of a cap than cone because the lesion was so large, that had I done a deep cone, I would have taken almost the entire cervix off.  I cut across the top of this cervical canal with the scissors and there was some tissue from the endocervix left that did not come with the specimen, so I removed it.  I then did an endocervical curettage and endometrial curettage after prepping the cervix with Betadine; then I reapproximated the cervix with four interrupted sutures of 0 chromic catgut.   Patient seemed to tolerate the procedure well.  I did sound the uterus at the end of the procedure to confirm that the canal was patent and placed a little bit of Monsel solution in the endocervical canal. Prior to doing the endocervical curettage, I had defocused the beam and used the laser to control very small bleeders.  The total blood loss for the entire procedure was less than 5 cc.  Patient was returned to recovery in satisfactory condition. DD:  06/04/99 TD:  06/08/99 Job: [id number]

## 2010-06-11 NOTE — Group Therapy Note (Signed)
NAMEJORDANA, DUGUE NO.:  [id number]   MEDICAL RECORD NO.:  [id number]          PATIENT TYPE:  WOC   LOCATION:  [facility]                   FACILITY:  [facility]   PHYSICIAN:  Dorthula Perfect, MD     DATE OF BIRTH:  08-12-1980   DATE OF SERVICE:                                    CLINIC NOTE   This 30 year old black female, gravida 0, last menstrual period July 7th  comes in today believing she has a yeast infection.  She was recently  treated for a Bartholin gland abscess and was treated with Keflex.  She was  subsequently put on amoxicillin by her dentist for a dental problem and then  developed a whitish irritating vaginal discharge.  She states that the  catheter in the Bartholin gland came out last week.   PAST MEDICAL HISTORY/OPERATIONS:  None.   ALLERGIES:  None.   MEDICATIONS:  None at this present time.   PHYSICAL EXAMINATION:  ABDOMEN:  The abdomen is somewhat obese, she weighs  254 pounds.  No masses are felt.  PELVIC EXAMINATION:  External genitalia are slightly reddened.  Vaginal  vault contains a whitish watery discharge.  Cervix is negative.  Uterus is a  normal size and shape.  The adnexal areas were normal.  The left Bartholin  area is pretty much normal now.  There is a very small 1 cm area where the  Bartholin abscess was.   Wet prep revealed a few yeast hyphae.   IMPRESSION:  1.  Vaginitis-Monilia.  2.  Status post Bartholin gland abscess.   DISPOSITION:  1.  Diflucan 150 mg, 2 tablets.  She is to take 1 today and then another one      orally in 3 days.  2.  Hydrocortisone cream to use externally for irritation.           ______________________________  Dorthula Perfect, MD     ER/MEDQ  D:  08/11/2005  T:  08/11/2005  Job:  [id number]

## 2010-06-29 ENCOUNTER — Inpatient Hospital Stay (HOSPITAL_COMMUNITY)
Admission: AD | Admit: 2010-06-29 | Discharge: 2010-06-29 | Disposition: A | Discharge status: Home / Self Care | Payer: Self-pay | Source: Ambulatory Visit | Attending: Obstetrics & Gynecology | Admitting: Obstetrics & Gynecology

## 2010-06-29 DIAGNOSIS — R300 Dysuria: Secondary | ICD-10-CM | POA: Insufficient documentation

## 2010-06-29 DIAGNOSIS — N390 Urinary tract infection, site not specified: Secondary | ICD-10-CM | POA: Insufficient documentation

## 2010-06-29 LAB — URINE MICROSCOPIC-ADD ON

## 2010-06-29 LAB — URINALYSIS, ROUTINE W REFLEX MICROSCOPIC
Bilirubin Urine: NEGATIVE
Ketones, ur: 15 mg/dL — AB
Nitrite: POSITIVE — AB
Urobilinogen, UA: 1 mg/dL (ref 0.0–1.0)

## 2010-06-30 LAB — URINE CULTURE: Culture  Setup Time: 201206050840

## 2010-07-04 ENCOUNTER — Emergency Department (HOSPITAL_COMMUNITY)
Admission: EM | Admit: 2010-07-04 | Discharge: 2010-07-05 | Discharge status: Against Medical Advice | Payer: Self-pay | Attending: Emergency Medicine | Admitting: Emergency Medicine

## 2010-10-18 ENCOUNTER — Emergency Department (HOSPITAL_COMMUNITY)
Admission: EM | Admit: 2010-10-18 | Discharge: 2010-10-18 | Disposition: A | Discharge status: Home / Self Care | Payer: Self-pay | Attending: Emergency Medicine | Admitting: Emergency Medicine

## 2010-10-18 DIAGNOSIS — S199XXA Unspecified injury of neck, initial encounter: Secondary | ICD-10-CM | POA: Insufficient documentation

## 2010-10-18 DIAGNOSIS — IMO0002 Reserved for concepts with insufficient information to code with codable children: Secondary | ICD-10-CM | POA: Insufficient documentation

## 2010-10-18 DIAGNOSIS — Z79899 Other long term (current) drug therapy: Secondary | ICD-10-CM | POA: Insufficient documentation

## 2010-10-18 DIAGNOSIS — S0993XA Unspecified injury of face, initial encounter: Secondary | ICD-10-CM | POA: Insufficient documentation

## 2010-10-18 DIAGNOSIS — K219 Gastro-esophageal reflux disease without esophagitis: Secondary | ICD-10-CM | POA: Insufficient documentation

## 2010-10-18 DIAGNOSIS — W010XXA Fall on same level from slipping, tripping and stumbling without subsequent striking against object, initial encounter: Secondary | ICD-10-CM | POA: Insufficient documentation

## 2010-10-18 DIAGNOSIS — F3289 Other specified depressive episodes: Secondary | ICD-10-CM | POA: Insufficient documentation

## 2010-10-18 DIAGNOSIS — F329 Major depressive disorder, single episode, unspecified: Secondary | ICD-10-CM | POA: Insufficient documentation

## 2010-10-18 DIAGNOSIS — Y92009 Unspecified place in unspecified non-institutional (private) residence as the place of occurrence of the external cause: Secondary | ICD-10-CM | POA: Insufficient documentation

## 2010-10-22 LAB — URINALYSIS, ROUTINE W REFLEX MICROSCOPIC
Bilirubin Urine: NEGATIVE
Bilirubin Urine: NEGATIVE
Glucose, UA: NEGATIVE
Glucose, UA: NEGATIVE
Hgb urine dipstick: NEGATIVE
Ketones, ur: 15 — AB
Protein, ur: NEGATIVE
Specific Gravity, Urine: 1.015
Urobilinogen, UA: 0.2

## 2010-10-22 LAB — LIPID PANEL
HDL: 44
LDL Cholesterol: 113 — ABNORMAL HIGH
Triglycerides: 71
VLDL: 14

## 2010-10-22 LAB — URINE MICROSCOPIC-ADD ON

## 2010-10-22 LAB — COMPREHENSIVE METABOLIC PANEL
Albumin: 4
BUN: 5 — ABNORMAL LOW
Calcium: 9.1
Creatinine, Ser: 0.68
Glucose, Bld: 81
Total Protein: 7.6

## 2010-10-22 LAB — DIFFERENTIAL
Lymphocytes Relative: 7 — ABNORMAL LOW
Monocytes Absolute: 0.4
Monocytes Relative: 2 — ABNORMAL LOW
Neutro Abs: 16.9 — ABNORMAL HIGH
Neutrophils Relative %: 91 — ABNORMAL HIGH

## 2010-10-22 LAB — CBC
HCT: 40
MCHC: 32.7
MCV: 84.5
Platelets: 195
RDW: 14.8

## 2010-10-22 LAB — WET PREP, GENITAL
Clue Cells Wet Prep HPF POC: NONE SEEN
WBC, Wet Prep HPF POC: NONE SEEN
Yeast Wet Prep HPF POC: NONE SEEN

## 2010-10-22 LAB — GC/CHLAMYDIA PROBE AMP, GENITAL: GC Probe Amp, Genital: NEGATIVE

## 2010-10-22 LAB — POCT PREGNANCY, URINE: Preg Test, Ur: NEGATIVE

## 2010-11-05 LAB — CBC
HCT: 39.2
Hemoglobin: 13.2
RBC: 4.76
RDW: 15.1 — ABNORMAL HIGH
WBC: 10.6 — ABNORMAL HIGH

## 2010-11-05 LAB — POCT PREGNANCY, URINE
Operator id: 27524
Preg Test, Ur: NEGATIVE

## 2010-11-05 LAB — URINALYSIS, ROUTINE W REFLEX MICROSCOPIC
Bilirubin Urine: NEGATIVE
Hgb urine dipstick: NEGATIVE
Ketones, ur: NEGATIVE
Nitrite: NEGATIVE
Protein, ur: NEGATIVE
Specific Gravity, Urine: 1.02
Urobilinogen, UA: 0.2

## 2010-11-05 LAB — GC/CHLAMYDIA PROBE AMP, GENITAL: Chlamydia, DNA Probe: NEGATIVE

## 2010-11-05 LAB — WET PREP, GENITAL

## 2010-12-13 ENCOUNTER — Emergency Department (HOSPITAL_COMMUNITY)
Admission: EM | Admit: 2010-12-13 | Discharge: 2010-12-14 | Disposition: A | Discharge status: Home / Self Care | Payer: Self-pay | Attending: Emergency Medicine | Admitting: Emergency Medicine

## 2010-12-13 ENCOUNTER — Encounter: Payer: Self-pay | Admitting: *Deleted

## 2010-12-13 DIAGNOSIS — M546 Pain in thoracic spine: Secondary | ICD-10-CM | POA: Insufficient documentation

## 2010-12-13 DIAGNOSIS — K219 Gastro-esophageal reflux disease without esophagitis: Secondary | ICD-10-CM | POA: Insufficient documentation

## 2010-12-13 DIAGNOSIS — R51 Headache: Secondary | ICD-10-CM | POA: Insufficient documentation

## 2010-12-13 DIAGNOSIS — J45909 Unspecified asthma, uncomplicated: Secondary | ICD-10-CM | POA: Insufficient documentation

## 2010-12-13 DIAGNOSIS — R0789 Other chest pain: Secondary | ICD-10-CM | POA: Insufficient documentation

## 2010-12-13 HISTORY — DX: Headache: R51

## 2010-12-13 HISTORY — DX: Gastro-esophageal reflux disease without esophagitis: K21.9

## 2010-12-13 NOTE — ED Notes (Signed)
She has had pain in her lt ear for 2-3 weeks  And since Saturday she has had some shortness of breath and some anxiety over a situation that occurred on Saturday.  At present no acute respiratory distress.

## 2010-12-14 ENCOUNTER — Other Ambulatory Visit: Payer: Self-pay

## 2010-12-14 ENCOUNTER — Emergency Department (HOSPITAL_COMMUNITY): Payer: Self-pay

## 2010-12-14 MED ORDER — DEXAMETHASONE SODIUM PHOSPHATE 10 MG/ML IJ SOLN
10.0000 mg | Freq: Once | INTRAMUSCULAR | Status: AC
  Administered 2010-12-14: 10 mg via INTRAMUSCULAR
  Filled 2010-12-14: qty 1

## 2010-12-14 MED ORDER — METHOCARBAMOL 500 MG PO TABS
500.0000 mg | ORAL_TABLET | Freq: Once | ORAL | Status: AC
  Administered 2010-12-14: 500 mg via ORAL
  Filled 2010-12-14: qty 1

## 2010-12-14 MED ORDER — OXYCODONE-ACETAMINOPHEN 5-325 MG PO TABS
1.0000 | ORAL_TABLET | Freq: Once | ORAL | Status: AC
  Administered 2010-12-14: 1 via ORAL
  Filled 2010-12-14: qty 1

## 2010-12-14 MED ORDER — IBUPROFEN 800 MG PO TABS
800.0000 mg | ORAL_TABLET | Freq: Once | ORAL | Status: AC
  Administered 2010-12-14: 800 mg via ORAL
  Filled 2010-12-14: qty 1
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
## 2010-12-14 NOTE — ED Provider Notes (Addendum)
30 year old female with musculoskeletal type chest wall pain for the last 2 days after strenuous activity. She states that it is constant, worse with palpation, worse with stretching her arms lifting or pushing up off the bed.  Physical exam tenderness to palpation in the mid and left-sided chest which is reproducible, lungs and heart normal exam, no peripheral edema, no acute distress  Assessment plan chest wall pain, EKG normal, chest x-ray normal, with anti-inflammatory medications  Vida Roller, MD 12/14/10 [phone]  Medical screening examination/treatment/procedure(s) were conducted as a shared visit with non-physician practitioner(s) and myself.  I personally evaluated the patient during the encounter   Vida Roller, MD 12/14/10 [phone]

## 2010-12-14 NOTE — ED Notes (Signed)
Rounded on pt in waiting room. Pt is in no obvious distress. There is no overt dyspnea. Advised pt she should be getting a room shortly

## 2010-12-22 ENCOUNTER — Emergency Department (HOSPITAL_COMMUNITY)
Admission: EM | Admit: 2010-12-22 | Discharge: 2010-12-22 | Disposition: A | Discharge status: Home / Self Care | Payer: Self-pay | Attending: Emergency Medicine | Admitting: Emergency Medicine

## 2010-12-22 ENCOUNTER — Encounter (HOSPITAL_COMMUNITY): Payer: Self-pay

## 2010-12-22 DIAGNOSIS — G4700 Insomnia, unspecified: Secondary | ICD-10-CM | POA: Insufficient documentation

## 2010-12-22 DIAGNOSIS — F341 Dysthymic disorder: Secondary | ICD-10-CM | POA: Insufficient documentation

## 2010-12-22 DIAGNOSIS — F418 Other specified anxiety disorders: Secondary | ICD-10-CM

## 2010-12-22 DIAGNOSIS — R0602 Shortness of breath: Secondary | ICD-10-CM | POA: Insufficient documentation

## 2010-12-22 DIAGNOSIS — R079 Chest pain, unspecified: Secondary | ICD-10-CM | POA: Insufficient documentation

## 2010-12-22 HISTORY — DX: Anxiety disorder, unspecified: F41.9

## 2010-12-22 LAB — POCT PREGNANCY, URINE: Preg Test, Ur: NEGATIVE

## 2010-12-22 MED ORDER — LORAZEPAM 1 MG PO TABS
1.0000 mg | ORAL_TABLET | Freq: Once | ORAL | Status: AC
  Administered 2010-12-22: 1 mg via ORAL
  Filled 2010-12-22: qty 1

## 2010-12-22 MED ORDER — LORAZEPAM 1 MG PO TABS: 1.0000 mg | ORAL_TABLET | Freq: Three times a day (TID) | ORAL | Status: AC | PRN
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
## 2010-12-22 NOTE — ED Notes (Signed)
Pt here with Anxiety, says she has a hx  Of this but has got worse over the last 5 days. Pt says she was on anxiety and depression medication over a year ago but not currently taking anything now. Pt also report some chest discomfort with the onset of anxiety 5 days ago. Describe chest discomfort as something witting n her chest
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
## 2010-12-23 NOTE — ED Provider Notes (Signed)
Medical screening examination/treatment/procedure(s) were performed by non-physician practitioner and as supervising physician I was immediately available for consultation/collaboration.  Cyndra Numbers, MD 12/23/10 [phone]

## 2010-12-25 ENCOUNTER — Emergency Department (HOSPITAL_COMMUNITY)
Admission: EM | Admit: 2010-12-25 | Discharge: 2010-12-25 | Discharge status: Against Medical Advice | Payer: Self-pay | Attending: Emergency Medicine | Admitting: Emergency Medicine

## 2010-12-25 ENCOUNTER — Encounter (HOSPITAL_COMMUNITY): Payer: Self-pay | Admitting: *Deleted

## 2010-12-25 DIAGNOSIS — R079 Chest pain, unspecified: Secondary | ICD-10-CM | POA: Insufficient documentation

## 2010-12-25 DIAGNOSIS — R0602 Shortness of breath: Secondary | ICD-10-CM | POA: Insufficient documentation

## 2010-12-25 NOTE — ED Notes (Signed)
Pt sates she has been seen 3 times in the las week for this type of pain, states she is having chest pain and it hurts to walk, feels SHob, pt in no distress. Also states she was given Ativan on her last visit which helped some

## 2011-01-03 ENCOUNTER — Encounter (HOSPITAL_COMMUNITY): Payer: Self-pay

## 2011-01-03 ENCOUNTER — Emergency Department (HOSPITAL_COMMUNITY)
Admission: EM | Admit: 2011-01-03 | Discharge: 2011-01-03 | Disposition: A | Discharge status: Home / Self Care | Payer: Self-pay | Source: Home / Self Care | Attending: Family Medicine | Admitting: Family Medicine

## 2011-01-03 DIAGNOSIS — F411 Generalized anxiety disorder: Secondary | ICD-10-CM

## 2011-01-03 DIAGNOSIS — J069 Acute upper respiratory infection, unspecified: Secondary | ICD-10-CM

## 2011-01-03 DIAGNOSIS — F419 Anxiety disorder, unspecified: Secondary | ICD-10-CM

## 2011-01-03 MED ORDER — LORAZEPAM 1 MG PO TABS: 0.5000 mg | ORAL_TABLET | Freq: Three times a day (TID) | ORAL | Status: DC | PRN

## 2011-01-03 MED ORDER — IPRATROPIUM BROMIDE 0.06 % NA SOLN: 2.0000 | Freq: Four times a day (QID) | NASAL | Status: DC

## 2011-01-03 NOTE — ED Notes (Signed)
C/o HA, nasal burning , congestion , ST

## 2011-01-03 NOTE — ED Notes (Signed)
12-28 has an appt to see MD reguarding her anxiety; ran out of her Rx

## 2011-01-03 NOTE — ED Provider Notes (Signed)
History     CSN: [id number] Arrival date & time: 01/03/2011  6:24 PM   First MD Initiated Contact with Patient 01/03/11 1725      Chief Complaint  Patient presents with  . Anxiety    (Consider location/radiation/quality/duration/timing/severity/associated sxs/prior treatment) Patient is a 30 y.o. female presenting with anxiety and URI. The history is provided by the patient.  Anxiety This is a recurrent problem. The current episode started 6 to 12 hours ago (ran out of ativan today, need for panic, ).  URI The primary symptoms include sore throat. Primary symptoms do not include fever. The current episode started 3 to 5 days ago. This is a new problem. The problem has not changed since onset. The onset of the illness is associated with exposure to sick contacts. Symptoms associated with the illness include facial pain, congestion and rhinorrhea.    Past Medical History  Diagnosis Date  . Asthma   . GERD (gastroesophageal reflux disease)   . Headache   . Anxiety   . Environmental allergies   . Acid reflux   . Hiatal hernia     Past Surgical History  Procedure Date  . Cervical cone biopsy   . Marsupialization urethral diverticulum     History reviewed. No pertinent family history.  History  Substance Use Topics  . Smoking status: Never Smoker   . Smokeless tobacco: Not on file  . Alcohol Use: No    OB History    Grav Para Term Preterm Abortions TAB SAB Ect Mult Living                  Review of Systems  Constitutional: Negative for fever.  HENT: Positive for congestion, sore throat, rhinorrhea and postnasal drip.   Respiratory: Negative.   Cardiovascular: Negative.   Psychiatric/Behavioral: The patient is nervous/anxious.     Allergies  Amoxicillin; Doxycycline hyclate; and Toradol  Home Medications   Current Outpatient Rx  Name Route Sig Dispense Refill  . ALBUTEROL SULFATE HFA 108 (90 BASE) MCG/ACT IN AERS Inhalation Inhale 2 puffs into the lungs  every 6 (six) hours as needed. For shortness of breath/wheezing     . HYDROCODONE-ACETAMINOPHEN 5-500 MG PO TABS Oral Take 1 tablet by mouth every 6 (six) hours as needed. For pain.     . IPRATROPIUM BROMIDE 0.06 % NA SOLN Nasal Place 2 sprays into the nose 4 (four) times daily. 15 mL 12  . LORAZEPAM 1 MG PO TABS Oral Take 0.5 tablets (0.5 mg total) by mouth 3 (three) times daily as needed for anxiety. 30 tablet 0  . MOMETASONE FUROATE 220 MCG/INH IN AEPB Inhalation Inhale 2 puffs into the lungs daily.      Marland Kitchen OMEPRAZOLE 20 MG PO CPDR Oral Take 40 mg by mouth 2 (two) times daily.        BP 119/83  Pulse 67  Temp(Src) 98.2 F (36.8 C) (Oral)  Resp 20  SpO2 100%  LMP 12/30/2010  Physical Exam  Nursing note and vitals reviewed. Constitutional: She appears well-developed and well-nourished.  HENT:  Head: Normocephalic.  Right Ear: External ear normal.  Left Ear: External ear normal.  Mouth/Throat: Oropharynx is clear and moist.  Eyes: Conjunctivae and EOM are normal. Pupils are equal, round, and reactive to light.  Neck: Normal range of motion. Neck supple.  Cardiovascular: Normal rate, normal heart sounds and intact distal pulses.   Pulmonary/Chest: Effort normal and breath sounds normal.  Skin: Skin is warm and dry.  ED Course  Procedures (including critical care time)  Labs Reviewed - No data to display No results found.   1. Upper respiratory infection   2. Chronic anxiety       MDM          Barkley Bruns, MD 01/03/11 [phone]

## 2011-01-13 ENCOUNTER — Emergency Department (HOSPITAL_COMMUNITY)
Admission: EM | Admit: 2011-01-13 | Discharge: 2011-01-13 | Disposition: A | Discharge status: Home / Self Care | Payer: Self-pay | Attending: Emergency Medicine | Admitting: Emergency Medicine

## 2011-01-13 ENCOUNTER — Encounter (HOSPITAL_COMMUNITY): Payer: Self-pay | Admitting: Emergency Medicine

## 2011-01-13 DIAGNOSIS — R1084 Generalized abdominal pain: Secondary | ICD-10-CM | POA: Insufficient documentation

## 2011-01-13 DIAGNOSIS — J45909 Unspecified asthma, uncomplicated: Secondary | ICD-10-CM | POA: Insufficient documentation

## 2011-01-13 DIAGNOSIS — R197 Diarrhea, unspecified: Secondary | ICD-10-CM | POA: Insufficient documentation

## 2011-01-13 DIAGNOSIS — R112 Nausea with vomiting, unspecified: Secondary | ICD-10-CM | POA: Insufficient documentation

## 2011-01-13 DIAGNOSIS — K219 Gastro-esophageal reflux disease without esophagitis: Secondary | ICD-10-CM | POA: Insufficient documentation

## 2011-01-13 LAB — URINALYSIS, ROUTINE W REFLEX MICROSCOPIC
Bilirubin Urine: NEGATIVE
Ketones, ur: 15 mg/dL — AB
Nitrite: NEGATIVE
pH: 5.5 (ref 5.0–8.0)

## 2011-01-13 LAB — COMPREHENSIVE METABOLIC PANEL
ALT: 14 U/L (ref 0–35)
AST: 19 U/L (ref 0–37)
CO2: 23 mEq/L (ref 19–32)
Chloride: 103 mEq/L (ref 96–112)
GFR calc non Af Amer: >90 mL/min (ref >90)
Potassium: 3.9 mEq/L (ref 3.5–5.1)
Sodium: 136 mEq/L (ref 135–145)
Total Bilirubin: 0.4 mg/dL (ref 0.3–1.2)

## 2011-01-13 LAB — CBC
HCT: 36.7 % (ref 36.0–46.0)
Platelets: 170 10*3/uL (ref 150–400)
RDW: 14.3 % (ref 11.5–15.5)
WBC: 9.1 10*3/uL (ref 4.0–10.5)

## 2011-01-13 LAB — DIFFERENTIAL
Basophils Absolute: 0 10*3/uL (ref 0.0–0.1)
Lymphocytes Relative: 11 % — ABNORMAL LOW (ref 12–46)
Neutro Abs: 7.3 10*3/uL (ref 1.7–7.7)
Neutrophils Relative %: 80 % — ABNORMAL HIGH (ref 43–77)

## 2011-01-13 LAB — URINE MICROSCOPIC-ADD ON

## 2011-01-13 MED ORDER — ONDANSETRON HCL 4 MG/2ML IJ SOLN
4.0000 mg | Freq: Once | INTRAMUSCULAR | Status: AC
  Administered 2011-01-13: 4 mg via INTRAVENOUS
  Filled 2011-01-13: qty 2

## 2011-01-13 MED ORDER — SODIUM CHLORIDE 0.9 % IV BOLUS (SEPSIS)
1000.0000 mL | Freq: Once | INTRAVENOUS | Status: AC
  Administered 2011-01-13: 1000 mL via INTRAVENOUS

## 2011-01-13 MED ORDER — LOPERAMIDE HCL 2 MG PO CAPS: 2.0000 mg | ORAL_CAPSULE | Freq: Four times a day (QID) | ORAL | Status: AC | PRN

## 2011-01-13 MED ORDER — HYDROMORPHONE HCL PF 1 MG/ML IJ SOLN
1.0000 mg | Freq: Once | INTRAMUSCULAR | Status: AC
  Administered 2011-01-13: 1 mg via INTRAVENOUS
  Filled 2011-01-13: qty 1

## 2011-01-13 MED ORDER — ONDANSETRON HCL 8 MG PO TABS: 8.0000 mg | ORAL_TABLET | Freq: Three times a day (TID) | ORAL | Status: AC | PRN

## 2011-01-13 NOTE — ED Notes (Signed)
Pt c/o nausea, vomiting and diarrhea, onset Wed am.  Unable to keep anything down

## 2011-01-13 NOTE — ED Notes (Signed)
Pt tolerated po

## 2011-01-13 NOTE — ED Notes (Signed)
Pt to ED c/o intermittant abd pain, emesis and diarrhea since last Tuesday.  BS hyperactive in all 4 quadrants.  Pt has had 4 episodes of diarrhea since she has been in her room.

## 2011-01-13 NOTE — ED Provider Notes (Signed)
History     CSN: [id number] Arrival date & time: 01/13/2011  1:24 AM   First MD Initiated Contact with Patient 01/13/11 0308      Chief Complaint  Patient presents with  . Abdominal Pain    (Consider location/radiation/quality/duration/timing/severity/associated sxs/prior treatment) Patient is a 30 y.o. female presenting with abdominal pain. The history is provided by the patient.  Abdominal Pain The primary symptoms of the illness include abdominal pain, vomiting and diarrhea. The current episode started 13 to 24 hours ago. The onset of the illness was sudden.  The abdominal pain is generalized.  The vomiting began today. Vomiting occurs 2 to 5 times per day.  The diarrhea occurs more than 10 times per day.  Additional symptoms associated with the illness include chills. Symptoms associated with the illness do not include urgency or back pain.  Pt has not been able to keep any fluids or food down.  Past Medical History  Diagnosis Date  . Asthma   . GERD (gastroesophageal reflux disease)   . Headache   . Anxiety   . Environmental allergies   . Acid reflux   . Hiatal hernia     Past Surgical History  Procedure Date  . Cervical cone biopsy   . Marsupialization urethral diverticulum     No family history on file.  History  Substance Use Topics  . Smoking status: Never Smoker   . Smokeless tobacco: Not on file  . Alcohol Use: No    OB History    Grav Para Term Preterm Abortions TAB SAB Ect Mult Living                  Review of Systems  Constitutional: Positive for chills.  Gastrointestinal: Positive for vomiting, abdominal pain and diarrhea.  Genitourinary: Negative for urgency.  Musculoskeletal: Negative for back pain.  All other systems reviewed and are negative.    Allergies  Amoxicillin; Doxycycline hyclate; and Toradol  Home Medications   Current Outpatient Rx  Name Route Sig Dispense Refill  . ALBUTEROL SULFATE HFA 108 (90 BASE) MCG/ACT IN  AERS Inhalation Inhale 2 puffs into the lungs every 6 (six) hours as needed. For shortness of breath/wheezing     . HYDROCODONE-ACETAMINOPHEN 5-500 MG PO TABS Oral Take 1 tablet by mouth every 6 (six) hours as needed. For pain.     . IPRATROPIUM BROMIDE 0.06 % NA SOLN Nasal Place 2 sprays into the nose 4 (four) times daily. 15 mL 12  . LORAZEPAM 1 MG PO TABS Oral Take 2 mg by mouth 3 (three) times daily as needed. anxiety     . MOMETASONE FUROATE 220 MCG/INH IN AEPB Inhalation Inhale 2 puffs into the lungs daily.      Marland Kitchen OMEPRAZOLE 20 MG PO CPDR Oral Take 40 mg by mouth 2 (two) times daily.        BP 126/79  Pulse 92  Temp(Src) 98.6 F (37 C) (Oral)  Resp 20  SpO2 96%  LMP 12/30/2010  Physical Exam  Nursing note and vitals reviewed. Constitutional: She appears well-developed and well-nourished. No distress.  HENT:  Head: Normocephalic and atraumatic.  Right Ear: External ear normal.  Left Ear: External ear normal.  Eyes: Conjunctivae are normal. Right eye exhibits no discharge. Left eye exhibits no discharge. No scleral icterus.  Neck: Neck supple. No tracheal deviation present.  Cardiovascular: Normal rate, regular rhythm and intact distal pulses.   Pulmonary/Chest: Effort normal and breath sounds normal. No stridor. No respiratory distress.  She has no wheezes. She has no rales.  Abdominal: Soft. Bowel sounds are normal. She exhibits no distension and no mass. There is no tenderness. There is no rebound and no guarding.  Musculoskeletal: She exhibits no edema and no tenderness.  Neurological: She is alert. She has normal strength. No sensory deficit. Cranial nerve deficit:  no gross defecits noted. She exhibits normal muscle tone. She displays no seizure activity. Coordination normal.  Skin: Skin is warm and dry. No rash noted.  Psychiatric: She has a normal mood and affect.    ED Course  Procedures (including critical care time)  Medications  LORazepam (ATIVAN) 1 MG tablet (not  administered)  HYDROmorphone (DILAUDID) injection 1 mg (not administered)  HYDROmorphone (DILAUDID) injection 1 mg (1 mg Intravenous Given 01/13/11 0427)  ondansetron (ZOFRAN) injection 4 mg (4 mg Intravenous Given 01/13/11 0426)  sodium chloride 0.9 % bolus 1,000 mL (1000 mL Intravenous Given 01/13/11 0426)    Labs Reviewed  URINALYSIS, ROUTINE W REFLEX MICROSCOPIC - Abnormal; Notable for the following:    Ketones, ur 15 (*)    Protein, ur 30 (*)    All other components within normal limits  URINE MICROSCOPIC-ADD ON - Abnormal; Notable for the following:    Squamous Epithelial / LPF FEW (*)    All other components within normal limits  POCT PREGNANCY, URINE  POCT PREGNANCY, URINE   No results found.   Diagnosis: Vomiting and diarrhea, abdominal pain   MDM  Patient does not have any abdominal tenderness to palpation. She does complain of persistent cramping with vomiting and diarrhea. The symptoms to suggest a viral type illness.  There doesn't appear to be any evidence of an obstruction. I doubt appendicitis. Patient has been given IV fluids with some improvement. She will be discharged with precautions and prescriptions for nausea and diarrhea.        Celene Kras, MD 01/13/11 0600

## 2011-01-13 NOTE — ED Notes (Signed)
Pt called, Rx for Zofran 8 mg.  Dr Adriana Simas notified and telephone order  rcvd for Phenergan 25 mg  Tab, 1 po every 4 hours as needed for nausea and vomiting, #15.Marland Kitchen  Rx called to [facility] outpatient  pharmacy [phone].  Pt notified.

## 2011-01-19 ENCOUNTER — Emergency Department (INDEPENDENT_AMBULATORY_CARE_PROVIDER_SITE_OTHER)
Admission: EM | Admit: 2011-01-19 | Discharge: 2011-01-19 | Disposition: A | Discharge status: Home / Self Care | Payer: Self-pay | Source: Home / Self Care

## 2011-01-19 ENCOUNTER — Encounter (HOSPITAL_COMMUNITY): Payer: Self-pay | Admitting: *Deleted

## 2011-01-19 DIAGNOSIS — F309 Manic episode, unspecified: Secondary | ICD-10-CM

## 2011-01-19 MED ORDER — HALOPERIDOL 1 MG PO TABS: 1.0000 mg | ORAL_TABLET | Freq: Two times a day (BID) | ORAL | Status: AC

## 2011-01-19 NOTE — ED Notes (Signed)
Pt  Reports she  Has  Anxiety       She  Is   Requesting        Ativan    She  Has    A n  Empty  Bottle  Of  Ativan   With  Her    Her  Last  Ativan  Was  2  Days       Pt  Has    Rapid  Speech       She     Is  tearfull  And          Anxious   She   Has  History  Of  Asthma  And  Acid  Reflux       Pt  Has  Been irritable     And  Sad     Which    She    Reports  Comes  And   denys  Any  Suicidal  Ideations

## 2011-01-19 NOTE — ED Provider Notes (Signed)
Amanda [name] is a 30 year old woman who presents with anxiety. She has had worsening anxiety over the past month and has had multiple violations in urgent care and emergency room. Most recently she was seen on the 10th and 18th for anxiety. On the 10th she was given Ativan which she has run out of. She describes worsening irritability racing thoughts anxiety and getting into trouble. For example she had a confrontation with the police recently which is unusual for her. Her boyfriend says that her behavior has been odd recently.  She denies any delusions or hallucinations.  She denies any suicidal or homicidal ideation. She has an appointment with her primary care provider in 2 days.  Mood disorder questionnaire 11/14 positive with seriously bothering her and multiple primary relatives with what appears to be bipolar disorder.  Positives are overactive irritability self-confident talkative racing thoughts distractibility more energy more active more social more risky behaviors and all this happened at the same time.    PMH reviewed. History of recurrent depression not adequately treated with SSRI ROS as above otherwise neg Medications reviewed. No current facility-administered medications for this encounter.   Current Outpatient Prescriptions  Medication Sig Dispense Refill  . albuterol (PROVENTIL HFA;VENTOLIN HFA) 108 (90 BASE) MCG/ACT inhaler Inhale 2 puffs into the lungs every 6 (six) hours as needed. For shortness of breath/wheezing       . haloperidol (HALDOL) 1 MG tablet Take 1 tablet (1 mg total) by mouth 2 (two) times daily.  14 tablet  0  . HYDROcodone-acetaminophen (VICODIN) 5-500 MG per tablet Take 1 tablet by mouth every 6 (six) hours as needed. For pain.       Marland Kitchen ipratropium (ATROVENT) 0.06 % nasal spray Place 2 sprays into the nose 4 (four) times daily.  15 mL  12  . loperamide (IMODIUM) 2 MG capsule Take 1 capsule (2 mg total) by mouth 4 (four) times daily as needed for diarrhea or  loose stools.  12 capsule  0  . mometasone (ASMANEX) 220 MCG/INH inhaler Inhale 2 puffs into the lungs daily.        Marland Kitchen omeprazole (PRILOSEC) 20 MG capsule Take 40 mg by mouth 2 (two) times daily.        . ondansetron (ZOFRAN) 8 MG tablet Take 1 tablet (8 mg total) by mouth every 8 (eight) hours as needed for nausea.  20 tablet  0    Exam:  BP 126/81  Pulse 60  Temp(Src) 97.5 F (36.4 C) (Oral)  Resp 16  SpO2 100%  LMP 12/30/2010 Gen: Well NAD Psych: Active affect speech is pressured and rapid and a bit tangential.  Eye contact is good thought process is slightly tangential but is mostly linear and goal-directed.  No delusions or hallucinations expressed and is not responding to internal stimuli. No suicidal or homicidally.  Assessment and plan: 30 year old with likely acute mania in the setting of bipolar disorder.  Patient has no insurance therefore we will have to find something that she can afford.  Haloperidol is available at [facility] for $4 and has indications for acute mania.  We will use 1 mg twice daily until she sees her primary care doctor in 2 days. I had extensive discussions with her boyfriend about signs or symptoms that would promote presentation to the emergency room. Additionally I gave her handout on the [facility] crisis line.  She expresses understanding and will followup.   Clementeen Graham 01/19/11 1915

## 2011-01-19 NOTE — ED Provider Notes (Signed)
Medical screening examination/treatment/procedure(s) were performed by non-physician practitioner and as supervising physician I was immediately available for consultation/collaboration.   Dejay Kronk DOUGLAS MD.    Anupama Piehl Douglas Gennaro Lizotte, MD 01/19/11 2100 

## 2011-03-17 ENCOUNTER — Inpatient Hospital Stay (HOSPITAL_COMMUNITY)
Admission: AD | Admit: 2011-03-17 | Discharge: 2011-03-18 | Disposition: A | Discharge status: Home / Self Care | Payer: Self-pay | Source: Ambulatory Visit | Attending: Obstetrics & Gynecology | Admitting: Obstetrics & Gynecology

## 2011-03-17 ENCOUNTER — Encounter (HOSPITAL_COMMUNITY): Payer: Self-pay | Admitting: *Deleted

## 2011-03-17 ENCOUNTER — Inpatient Hospital Stay (HOSPITAL_COMMUNITY): Payer: Self-pay

## 2011-03-17 DIAGNOSIS — K219 Gastro-esophageal reflux disease without esophagitis: Secondary | ICD-10-CM | POA: Insufficient documentation

## 2011-03-17 DIAGNOSIS — N949 Unspecified condition associated with female genital organs and menstrual cycle: Secondary | ICD-10-CM | POA: Insufficient documentation

## 2011-03-17 DIAGNOSIS — R102 Pelvic and perineal pain: Secondary | ICD-10-CM

## 2011-03-17 DIAGNOSIS — J45909 Unspecified asthma, uncomplicated: Secondary | ICD-10-CM | POA: Insufficient documentation

## 2011-03-17 DIAGNOSIS — N760 Acute vaginitis: Secondary | ICD-10-CM | POA: Insufficient documentation

## 2011-03-17 DIAGNOSIS — A499 Bacterial infection, unspecified: Secondary | ICD-10-CM | POA: Insufficient documentation

## 2011-03-17 DIAGNOSIS — B9689 Other specified bacterial agents as the cause of diseases classified elsewhere: Secondary | ICD-10-CM | POA: Insufficient documentation

## 2011-03-17 DIAGNOSIS — R109 Unspecified abdominal pain: Secondary | ICD-10-CM | POA: Insufficient documentation

## 2011-03-17 LAB — URINALYSIS, ROUTINE W REFLEX MICROSCOPIC
Bilirubin Urine: NEGATIVE
Glucose, UA: NEGATIVE mg/dL
Hgb urine dipstick: NEGATIVE
Protein, ur: NEGATIVE mg/dL
Urobilinogen, UA: 0.2 mg/dL (ref 0.0–1.0)

## 2011-03-17 LAB — POCT PREGNANCY, URINE: Preg Test, Ur: NEGATIVE

## 2011-03-17 LAB — WET PREP, GENITAL

## 2011-03-17 MED ORDER — OXYCODONE-ACETAMINOPHEN 5-325 MG PO TABS
2.0000 | ORAL_TABLET | Freq: Once | ORAL | Status: AC
  Administered 2011-03-17: 2 via ORAL
  Filled 2011-03-17: qty 2

## 2011-03-17 NOTE — Progress Notes (Signed)
Pt states she has had abd cramping all day-

## 2011-03-17 NOTE — ED Provider Notes (Signed)
History     Chief Complaint  Patient presents with  . Abdominal Pain   HPI 31 y.o. [id number] at with pelvic pain and generalized abdominal cramping today, no bleeding or discharge, no n/v, constipation or diarrhea. States that she "keeps a bacterial infection".     Past Medical History  Diagnosis Date  . Asthma   . GERD (gastroesophageal reflux disease)   . Headache   . Anxiety   . Environmental allergies   . Acid reflux   . Hiatal hernia     Past Surgical History  Procedure Date  . Cervical cone biopsy   . Marsupialization urethral diverticulum     Family History  Problem Relation Age of Onset  . Diabetes Mother   . Hypertension Mother   . Depression Mother   . Depression Father     History  Substance Use Topics  . Smoking status: Never Smoker   . Smokeless tobacco: Not on file  . Alcohol Use: No    Allergies:  Allergies  Allergen Reactions  . Amoxicillin     REACTION: facial swelling and rash.  . Doxycycline Hyclate     REACTION: hives, itching, swellin of face  . Toradol     Patient stated that it makes her extremely hyper    Prescriptions prior to admission  Medication Sig Dispense Refill  . albuterol (PROVENTIL HFA;VENTOLIN HFA) 108 (90 BASE) MCG/ACT inhaler Inhale 2 puffs into the lungs every 6 (six) hours as needed. For shortness of breath/wheezing       . HYDROcodone-acetaminophen (VICODIN) 5-500 MG per tablet Take 1 tablet by mouth every 6 (six) hours as needed. For pain.       Marland Kitchen ipratropium (ATROVENT) 0.06 % nasal spray Place 2 sprays into the nose 4 (four) times daily.  15 mL  12  . mometasone (ASMANEX) 220 MCG/INH inhaler Inhale 2 puffs into the lungs daily.        Marland Kitchen omeprazole (PRILOSEC) 20 MG capsule Take 40 mg by mouth 2 (two) times daily.          Review of Systems  Constitutional: Negative.   Respiratory: Negative.   Cardiovascular: Negative.   Gastrointestinal: Positive for abdominal pain. Negative for nausea, vomiting, diarrhea and  constipation.  Genitourinary: Negative for dysuria, urgency, frequency, hematuria and flank pain.       Negative for vaginal bleeding, vaginal discharge, dyspareunia  Musculoskeletal: Negative.   Neurological: Negative.   Psychiatric/Behavioral: Negative.    Physical Exam   Blood pressure 143/70, pulse 84, temperature 98.8 F (37.1 C), temperature source Oral, resp. rate 20, height 5\' 2"  (1.575 m), weight 253 lb (114.76 kg), last menstrual period 03/05/2011, SpO2 100.00%.  Physical Exam  Constitutional: She is oriented to person, place, and time. She appears well-developed and well-nourished. No distress.  HENT:  Head: Normocephalic and atraumatic.  Cardiovascular: Normal rate, regular rhythm and normal heart sounds.   Respiratory: Effort normal and breath sounds normal. No respiratory distress.  GI: Soft. Bowel sounds are normal. She exhibits no distension and no mass. There is no tenderness. There is no rebound and no guarding.  Genitourinary: There is no rash or lesion on the right labia. There is no rash or lesion on the left labia. Uterus is not deviated, not enlarged, not fixed and not tender. Cervix exhibits motion tenderness. Cervix exhibits no discharge and no friability. Right adnexum displays tenderness. Right adnexum displays no mass and no fullness. Left adnexum displays tenderness. Left adnexum displays no mass and  no fullness. No erythema, tenderness or bleeding around the vagina. Vaginal discharge (white, malodorous) found.  Neurological: She is alert and oriented to person, place, and time.  Skin: Skin is warm and dry.  Psychiatric: She has a normal mood and affect.    MAU Course  Procedures  Results for orders placed during the hospital encounter of 03/17/11 (from the past 24 hour(s))  URINALYSIS, ROUTINE W REFLEX MICROSCOPIC     Status: Abnormal   Collection Time   03/17/11  9:49 PM      Component Value Range   Color, Urine YELLOW  YELLOW    APPearance CLEAR  CLEAR     Specific Gravity, Urine <1.005 (*) 1.005 - 1.030    pH 6.5  5.0 - 8.0    Glucose, UA NEGATIVE  NEGATIVE (mg/dL)   Hgb urine dipstick NEGATIVE  NEGATIVE    Bilirubin Urine NEGATIVE  NEGATIVE    Ketones, ur 15 (*) NEGATIVE (mg/dL)   Protein, ur NEGATIVE  NEGATIVE (mg/dL)   Urobilinogen, UA 0.2  0.0 - 1.0 (mg/dL)   Nitrite NEGATIVE  NEGATIVE    Leukocytes, UA NEGATIVE  NEGATIVE   PREGNANCY, URINE     Status: Normal   Collection Time   03/17/11  9:49 PM      Component Value Range   Preg Test, Ur NEGATIVE  NEGATIVE   POCT PREGNANCY, URINE     Status: Normal   Collection Time   03/17/11  9:53 PM      Component Value Range   Preg Test, Ur NEGATIVE  NEGATIVE   WET PREP, GENITAL     Status: Abnormal   Collection Time   03/17/11 11:20 PM      Component Value Range   Yeast Wet Prep HPF POC NONE SEEN  NONE SEEN    Trich, Wet Prep NONE SEEN  NONE SEEN    Clue Cells Wet Prep HPF POC FEW (*) NONE SEEN    WBC, Wet Prep HPF POC FEW (*) NONE SEEN      Assessment and Plan  30 y.o. [id number]  Pelvic pain - rx vicodin #15 BV - rx clindamycin F/U in GYN clinic if symptoms persist  Stokes Rattigan 03/17/2011, 10:13 PM

## 2011-03-17 NOTE — Progress Notes (Signed)
Pt states abdominal pain and cramping that became worse at about 1800. Pt states she went to work and did moderate work and pain became worse

## 2011-03-18 LAB — GC/CHLAMYDIA PROBE AMP, GENITAL: Chlamydia, DNA Probe: NEGATIVE

## 2011-03-18 MED ORDER — HYDROCODONE-ACETAMINOPHEN 5-500 MG PO TABS: 1.0000 | ORAL_TABLET | Freq: Four times a day (QID) | ORAL | Status: DC | PRN

## 2011-03-18 MED ORDER — ONDANSETRON 8 MG PO TBDP
8.0000 mg | ORAL_TABLET | Freq: Once | ORAL | Status: AC
  Administered 2011-03-18: 8 mg via ORAL
  Filled 2011-03-18: qty 1

## 2011-03-18 MED ORDER — CLINDAMYCIN HCL 300 MG PO CAPS: 300.0000 mg | ORAL_CAPSULE | Freq: Two times a day (BID) | ORAL | Status: AC

## 2011-03-18 NOTE — ED Provider Notes (Signed)
Attestation of Attending Supervision of Advanced Practitioner: Evaluation and management procedures were performed by the PA/NP/CNM/OB Fellow under my supervision/collaboration. Chart reviewed, and agree with management and plan.  Jaynie Collins, M.D. 03/18/2011 1:29 AM

## 2011-03-24 ENCOUNTER — Encounter (HOSPITAL_COMMUNITY): Payer: Self-pay | Admitting: Emergency Medicine

## 2011-03-24 ENCOUNTER — Emergency Department (HOSPITAL_COMMUNITY): Payer: Self-pay

## 2011-03-24 ENCOUNTER — Emergency Department (HOSPITAL_COMMUNITY)
Admission: EM | Admit: 2011-03-24 | Discharge: 2011-03-24 | Disposition: A | Discharge status: Home / Self Care | Payer: Self-pay | Attending: Emergency Medicine | Admitting: Emergency Medicine

## 2011-03-24 DIAGNOSIS — J45909 Unspecified asthma, uncomplicated: Secondary | ICD-10-CM | POA: Insufficient documentation

## 2011-03-24 DIAGNOSIS — K219 Gastro-esophageal reflux disease without esophagitis: Secondary | ICD-10-CM | POA: Insufficient documentation

## 2011-03-24 DIAGNOSIS — J069 Acute upper respiratory infection, unspecified: Secondary | ICD-10-CM | POA: Insufficient documentation

## 2011-03-24 LAB — RAPID STREP SCREEN (MED CTR MEBANE ONLY): Streptococcus, Group A Screen (Direct): NEGATIVE

## 2011-03-24 MED ORDER — HYDROCOD POLST-CHLORPHEN POLST 10-8 MG/5ML PO LQCR: 5.0000 mL | Freq: Two times a day (BID) | ORAL | Status: DC | PRN

## 2011-03-24 NOTE — ED Notes (Signed)
Pt alert, nad, c/o sore throat, onset a few days ago, resp even unlabored, skin pwd 

## 2011-03-24 NOTE — Discharge Instructions (Signed)

## 2011-03-24 NOTE — ED Provider Notes (Signed)
History     CSN: [id number]  Arrival date & time 03/24/11  2056   First MD Initiated Contact with Patient 03/24/11 2136      Chief Complaint  Patient presents with  . Sore Throat    (Consider location/radiation/quality/duration/timing/severity/associated sxs/prior treatment) Patient is a 31 y.o. female presenting with pharyngitis. The history is provided by the patient.  Sore Throat This is a new problem. Episode onset: three days. The problem has been gradually worsening. Associated symptoms include chest pain, congestion, coughing, a fever and a sore throat. Pertinent negatives include no abdominal pain, diaphoresis, headaches, myalgias, nausea, neck pain, rash, swollen glands or vomiting. The symptoms are aggravated by coughing and swallowing. She has tried nothing for the symptoms.    Past Medical History  Diagnosis Date  . Asthma   . GERD (gastroesophageal reflux disease)   . Headache   . Anxiety   . Environmental allergies   . Acid reflux   . Hiatal hernia     Past Surgical History  Procedure Date  . Cervical cone biopsy   . Marsupialization urethral diverticulum     Family History  Problem Relation Age of Onset  . Diabetes Mother   . Hypertension Mother   . Depression Mother   . Depression Father     History  Substance Use Topics  . Smoking status: Never Smoker   . Smokeless tobacco: Not on file  . Alcohol Use: No    OB History    Grav Para Term Preterm Abortions TAB SAB Ect Mult Living   2 0 0 0 2 0 2 0 0 0       Review of Systems  Constitutional: Positive for fever. Negative for diaphoresis.  HENT: Positive for congestion and sore throat. Negative for drooling, trouble swallowing, neck pain, neck stiffness, voice change, postnasal drip and sinus pressure.   Respiratory: Positive for cough. Negative for wheezing.   Cardiovascular: Positive for chest pain.  Gastrointestinal: Negative for nausea, vomiting and abdominal pain.  Musculoskeletal:  Negative for myalgias.  Skin: Negative for rash.  Neurological: Negative for dizziness, syncope, light-headedness and headaches.    Allergies  Doxycycline hyclate; Toradol; and Amoxicillin  Home Medications   Current Outpatient Rx  Name Route Sig Dispense Refill  . ALBUTEROL SULFATE HFA 108 (90 BASE) MCG/ACT IN AERS Inhalation Inhale 2 puffs into the lungs every 6 (six) hours as needed. For shortness of breath/wheezing     . CLINDAMYCIN HCL 300 MG PO CAPS Oral Take 1 capsule (300 mg total) by mouth 2 (two) times daily. 14 capsule 0  . CLONAZEPAM 1 MG PO TABS Oral Take 1 mg by mouth daily as needed. For anxiety    . HYDROCODONE-ACETAMINOPHEN 5-500 MG PO TABS Oral Take 1 tablet by mouth every 6 (six) hours as needed. For pain. 15 tablet 0  . OMEPRAZOLE 20 MG PO CPDR Oral Take 40 mg by mouth 2 (two) times daily.        BP 119/72  Pulse 98  Temp(Src) 99.5 F (37.5 C) (Oral)  Resp 16  Wt 255 lb (115.667 kg)  SpO2 100%  LMP 03/05/2011  Physical Exam  Nursing note and vitals reviewed. Constitutional: She is oriented to person, place, and time. She appears well-developed and well-nourished.  HENT:  Head: Normocephalic and atraumatic. No trismus in the jaw.  Right Ear: Tympanic membrane, external ear and ear canal normal.  Left Ear: Tympanic membrane, external ear and ear canal normal.  Nose: Mucosal edema present.  Mouth/Throat: Uvula is midline and mucous membranes are normal. Posterior oropharyngeal erythema present. No oropharyngeal exudate, posterior oropharyngeal edema or tonsillar abscesses.  Neck: Normal range of motion. Neck supple.  Cardiovascular: Normal rate, regular rhythm and normal heart sounds.   Pulmonary/Chest: Effort normal and breath sounds normal. No respiratory distress. She has no wheezes. She has no rales. She exhibits no tenderness.  Abdominal: Soft. Bowel sounds are normal. She exhibits no distension. There is no tenderness.  Musculoskeletal: Normal range of  motion.  Neurological: She is alert and oriented to person, place, and time.  Skin: Skin is warm and dry.  Psychiatric: She has a normal mood and affect.    ED Course  Procedures (including critical care time)   Labs Reviewed  RAPID STREP SCREEN   No results found.   No diagnosis found.    MDM  Patient with negative CXR and negative strep.  Feel that symptoms are most likely Viral URI.  Patient given cough suppressant and discharged home.    Pascal Lux [place], PA-C 03/25/11 1141  Pascal Lux [place], PA-C 03/25/11 1142

## 2011-03-26 NOTE — ED Provider Notes (Signed)
Medical screening examination/treatment/procedure(s) were performed by non-physician practitioner and as supervising physician I was immediately available for consultation/collaboration.  Huntley Demedeiros, MD 03/26/11 0124 

## 2011-03-30 ENCOUNTER — Emergency Department (HOSPITAL_COMMUNITY)
Admission: EM | Admit: 2011-03-30 | Discharge: 2011-03-30 | Disposition: A | Discharge status: Home / Self Care | Payer: Self-pay | Attending: Emergency Medicine | Admitting: Emergency Medicine

## 2011-03-30 ENCOUNTER — Encounter (HOSPITAL_COMMUNITY): Payer: Self-pay | Admitting: *Deleted

## 2011-03-30 DIAGNOSIS — H5710 Ocular pain, unspecified eye: Secondary | ICD-10-CM | POA: Insufficient documentation

## 2011-03-30 DIAGNOSIS — K219 Gastro-esophageal reflux disease without esophagitis: Secondary | ICD-10-CM | POA: Insufficient documentation

## 2011-03-30 DIAGNOSIS — F411 Generalized anxiety disorder: Secondary | ICD-10-CM | POA: Insufficient documentation

## 2011-03-30 DIAGNOSIS — H109 Unspecified conjunctivitis: Secondary | ICD-10-CM

## 2011-03-30 DIAGNOSIS — J45909 Unspecified asthma, uncomplicated: Secondary | ICD-10-CM | POA: Insufficient documentation

## 2011-03-30 MED ORDER — TOBRAMYCIN 0.3 % OP SOLN
2.0000 [drp] | Freq: Four times a day (QID) | OPHTHALMIC | Status: DC
  Administered 2011-03-30: 2 [drp] via OPHTHALMIC
  Filled 2011-03-30: qty 5

## 2011-03-30 NOTE — ED Provider Notes (Signed)
History     CSN: [id number]  Arrival date & time 03/30/11  0841   First MD Initiated Contact with Patient 03/30/11 [phone]      Chief Complaint  Patient presents with  . Eye Pain    (Consider location/radiation/quality/duration/timing/severity/associated sxs/prior treatment) Patient is a 31 y.o. female presenting with eye pain. The history is provided by the patient.  Eye Pain This is a new problem. The current episode started yesterday. The problem occurs constantly. The problem has been unchanged. Pertinent negatives include no chills or fever. Associated symptoms comments: She complains of eye matted shut in the morning. There is itching and redness isolated to left eye without visual compromise. 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    Past Medical History  Diagnosis Date  . Asthma   . GERD (gastroesophageal reflux disease)   . Headache   . Anxiety   . Environmental allergies   . Acid reflux   . Hiatal hernia     Past Surgical History  Procedure Date  . Cervical cone biopsy   . Marsupialization urethral diverticulum     Family History  Problem Relation Age of Onset  . Diabetes Mother   . Hypertension Mother   . Depression Mother   . Depression Father     History  Substance Use Topics  . Smoking status: Never Smoker   . Smokeless tobacco: Not on file  . Alcohol Use: No    OB History    Grav Para Term Preterm Abortions TAB SAB Ect Mult Living   2 0 0 0 2 0 2 0 0 0       Review of Systems  Constitutional: Negative for fever and chills.  HENT: Negative.   Eyes: Positive for pain, discharge, redness and itching. Negative for photophobia and visual disturbance.  Musculoskeletal: Negative.   Skin: Negative.   Neurological: Negative.     Allergies  Doxycycline hyclate; Toradol; and Amoxicillin  Home Medications   Current Outpatient Rx  Name Route Sig Dispense Refill  . ALBUTEROL SULFATE HFA 108 (90 BASE) MCG/ACT IN AERS Inhalation Inhale 2 puffs into  the lungs every 6 (six) hours as needed. For shortness of breath/wheezing     . HYDROCOD POLST-CPM POLST ER 10-8 MG/5ML PO LQCR Oral Take 5 mLs by mouth every 12 (twelve) hours as needed. 140 mL 0  . CLONAZEPAM 1 MG PO TABS Oral Take 1 mg by mouth daily as needed. For anxiety    . HYDROCODONE-ACETAMINOPHEN 5-500 MG PO TABS Oral Take 1 tablet by mouth every 6 (six) hours as needed. For pain. 15 tablet 0  . OMEPRAZOLE 20 MG PO CPDR Oral Take 40 mg by mouth 2 (two) times daily.        BP 138/79  Pulse 90  Temp(Src) 98.6 F (37 C) (Oral)  Resp 18  Wt 235 lb (106.595 kg)  SpO2 100%  LMP 03/05/2011  Physical Exam  Constitutional: She is oriented to person, place, and time. She appears well-developed and well-nourished.  Eyes: EOM are normal. Pupils are equal, round, and reactive to light. Right eye exhibits no discharge. No foreign body present in the right eye. Left eye exhibits no discharge. No foreign body present in the left eye. Right conjunctiva is not injected. Left conjunctiva is injected. Left conjunctiva has no hemorrhage.  Neck: Normal range of motion.  Pulmonary/Chest: Effort normal.  Neurological: She is alert and oriented to person, place, and time.  Skin: Skin is warm and dry.  ED Course  Procedures (including critical care time)  Labs Reviewed - No data to display No results found.   No diagnosis found.    MDM          Rodena Medin, PA-C 03/30/11 [phone]

## 2011-03-30 NOTE — ED Provider Notes (Signed)
Medical screening examination/treatment/procedure(s) were performed by non-physician practitioner and as supervising physician I was immediately available for consultation/collaboration.   Gerhard Munch, MD 03/30/11 1036

## 2011-03-30 NOTE — Progress Notes (Signed)
Pt listed as self pay with no insurance coverage Pt confirms she is self pay [place] resident.  CM and [facility] coordinator spoke with her Pt offered [facility] services to assist with finding a [place] self pay provider She has an [facility] that has expired Pt set an appointment with [name] to updated prior to this ED visit.

## 2011-03-30 NOTE — Discharge Instructions (Signed)
USE 2-3 EYE DROPS 4 TIMES DAILY FOR THE NEXT 5 DAYS. IF SYMPTOMS PERSIST OR WORSEN, FOLLOW UP WITH DR. Gwen Pounds FOR FURTHER MANAGEMENT.  Conjunctivitis Conjunctivitis is commonly called "pink eye." Conjunctivitis can be caused by bacterial or viral infection, allergies, or injuries. There is usually redness of the lining of the eye, itching, discomfort, and sometimes discharge. There may be deposits of matter along the eyelids. A viral infection usually causes a watery discharge, while a bacterial infection causes a yellowish, thick discharge. Pink eye is very contagious and spreads by direct contact. You may be given antibiotic eyedrops as part of your treatment. Before using your eye medicine, remove all drainage from the eye by washing gently with warm water and cotton balls. Continue to use the medication until you have awakened 2 mornings in a row without discharge from the eye. Do not rub your eye. This increases the irritation and helps spread infection. Use separate towels from other household members. Wash your hands with soap and water before and after touching your eyes. Use cold compresses to reduce pain and sunglasses to relieve irritation from light. Do not wear contact lenses or wear eye makeup until the infection is gone. SEEK MEDICAL CARE IF:   Your symptoms are not better after 3 days of treatment.   You have increased pain or trouble seeing.   The outer eyelids become very red or swollen.  Document Released: 02/18/2004 Document Revised: 12/30/2010 Document Reviewed: 01/10/2005 [facility] Patient Information 2012 [place], [place].

## 2011-03-30 NOTE — ED Notes (Signed)
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

## 2011-05-13 ENCOUNTER — Inpatient Hospital Stay (HOSPITAL_COMMUNITY): Payer: Self-pay

## 2011-05-13 ENCOUNTER — Encounter (HOSPITAL_COMMUNITY): Payer: Self-pay

## 2011-05-13 ENCOUNTER — Inpatient Hospital Stay (HOSPITAL_COMMUNITY)
Admission: AD | Admit: 2011-05-13 | Discharge: 2011-05-13 | Disposition: A | Discharge status: Home / Self Care | Payer: Self-pay | Source: Ambulatory Visit | Attending: Obstetrics & Gynecology | Admitting: Obstetrics & Gynecology

## 2011-05-13 DIAGNOSIS — Z1389 Encounter for screening for other disorder: Secondary | ICD-10-CM

## 2011-05-13 DIAGNOSIS — E669 Obesity, unspecified: Secondary | ICD-10-CM

## 2011-05-13 DIAGNOSIS — O26899 Other specified pregnancy related conditions, unspecified trimester: Secondary | ICD-10-CM

## 2011-05-13 DIAGNOSIS — N949 Unspecified condition associated with female genital organs and menstrual cycle: Secondary | ICD-10-CM

## 2011-05-13 DIAGNOSIS — R109 Unspecified abdominal pain: Secondary | ICD-10-CM | POA: Insufficient documentation

## 2011-05-13 DIAGNOSIS — O34599 Maternal care for other abnormalities of gravid uterus, unspecified trimester: Secondary | ICD-10-CM | POA: Insufficient documentation

## 2011-05-13 DIAGNOSIS — O2390 Unspecified genitourinary tract infection in pregnancy, unspecified trimester: Secondary | ICD-10-CM | POA: Insufficient documentation

## 2011-05-13 DIAGNOSIS — Z3490 Encounter for supervision of normal pregnancy, unspecified, unspecified trimester: Secondary | ICD-10-CM

## 2011-05-13 DIAGNOSIS — N831 Corpus luteum cyst of ovary, unspecified side: Secondary | ICD-10-CM | POA: Insufficient documentation

## 2011-05-13 DIAGNOSIS — N760 Acute vaginitis: Secondary | ICD-10-CM | POA: Insufficient documentation

## 2011-05-13 DIAGNOSIS — R102 Pelvic and perineal pain: Secondary | ICD-10-CM

## 2011-05-13 DIAGNOSIS — A499 Bacterial infection, unspecified: Secondary | ICD-10-CM | POA: Insufficient documentation

## 2011-05-13 DIAGNOSIS — B9689 Other specified bacterial agents as the cause of diseases classified elsewhere: Secondary | ICD-10-CM | POA: Insufficient documentation

## 2011-05-13 LAB — DIFFERENTIAL
Eosinophils Absolute: 0.2 10*3/uL (ref 0.0–0.7)
Eosinophils Relative: 1 % (ref 0–5)
Lymphs Abs: 1.9 10*3/uL (ref 0.7–4.0)
Monocytes Relative: 7 % (ref 3–12)
Neutrophils Relative %: 76 % (ref 43–77)

## 2011-05-13 LAB — CBC
Hemoglobin: 12.4 g/dL (ref 12.0–15.0)
MCH: 27.1 pg (ref 26.0–34.0)
MCV: 80.3 fL (ref 78.0–100.0)
RBC: 4.57 MIL/uL (ref 3.87–5.11)

## 2011-05-13 LAB — ABO/RH: ABO/RH(D): O POS

## 2011-05-13 LAB — URINALYSIS, ROUTINE W REFLEX MICROSCOPIC
Leukocytes, UA: NEGATIVE
Nitrite: NEGATIVE
Specific Gravity, Urine: 1.01 (ref 1.005–1.030)
Urobilinogen, UA: 0.2 mg/dL (ref 0.0–1.0)

## 2011-05-13 LAB — URINE MICROSCOPIC-ADD ON

## 2011-05-13 LAB — POCT PREGNANCY, URINE: Preg Test, Ur: POSITIVE — AB

## 2011-05-13 MED ORDER — METRONIDAZOLE 500 MG PO TABS: 500.0000 mg | ORAL_TABLET | Freq: Two times a day (BID) | ORAL | Status: AC

## 2011-05-13 MED ORDER — ACETAMINOPHEN 325 MG PO TABS
650.0000 mg | ORAL_TABLET | Freq: Once | ORAL | Status: AC
  Administered 2011-05-13: 650 mg via ORAL
  Filled 2011-05-13: qty 2

## 2011-05-13 NOTE — MAU Provider Note (Signed)
History     CSN: [id number]  Arrival date & time 05/13/11  1919   None     No chief complaint on file.   HPI Amanda [name] is a 31 y.o. female @ [redacted]w[redacted]d gestation who presents to MAU for abdominal pain. The pain has been off and on for 2 weeks. Positive HPT this morning. Nausea and vomiting started today. Has had headache and is light headed. LMP 03/30/11. Last pap smear last year at [facility] and was normal. Current sex partner x 2 weeks, before that sex partner of 8 months. Hx of Chlamydia years ago. Condoms for birth control but had occasions where they came off. The history was provided by the patient.  Past Medical History  Diagnosis Date  . Asthma   . GERD (gastroesophageal reflux disease)   . Headache   . Anxiety   . Environmental allergies   . Acid reflux   . Hiatal hernia     Past Surgical History  Procedure Date  . Cervical cone biopsy   . Marsupialization urethral diverticulum     Family History  Problem Relation Age of Onset  . Diabetes Mother   . Hypertension Mother   . Depression Mother   . Depression Father   . Anesthesia problems Neg Hx     History  Substance Use Topics  . Smoking status: Never Smoker   . Smokeless tobacco: Not on file  . Alcohol Use: No    OB History    Grav Para Term Preterm Abortions TAB SAB Ect Mult Living   2 0 0 0 1 0 1 0 0 0       Review of Systems  Constitutional: Positive for fatigue. Negative for fever, chills and diaphoresis.  HENT: Negative for ear pain, congestion, sore throat, facial swelling, neck pain, neck stiffness, dental problem and sinus pressure.   Eyes: Negative for photophobia, pain and discharge.  Respiratory: Negative for cough, chest tightness and wheezing.   Cardiovascular:       Cardiac murmer.   Gastrointestinal: Positive for nausea, vomiting, abdominal pain and diarrhea. Negative for constipation and abdominal distention.  Genitourinary: Positive for frequency, vaginal discharge and pelvic  pain. Negative for dysuria, flank pain, vaginal bleeding and difficulty urinating.  Musculoskeletal: Negative for myalgias, back pain and gait problem.  Skin: Negative for color change and rash.  Neurological: Positive for light-headedness and headaches. Negative for dizziness, speech difficulty, weakness and numbness.  Psychiatric/Behavioral: Negative for confusion and agitation. The patient is nervous/anxious.     Allergies  Doxycycline hyclate; Toradol; and Amoxicillin  Home Medications  No current outpatient prescriptions on file.  BP 117/56  Pulse 90  Temp(Src) 99.1 F (37.3 C) (Oral)  Resp 18  Ht 5\' 2"  (1.575 m)  Wt 248 lb (112.492 kg)  BMI 45.36 kg/m2  LMP 03/30/2011  Physical Exam  Nursing note and vitals reviewed. Constitutional: She is oriented to person, place, and time. She appears well-developed and well-nourished.  HENT:  Head: Normocephalic.  Eyes: EOM are normal.  Neck: Neck supple.  Cardiovascular: Normal rate.   Pulmonary/Chest: Effort normal.  Abdominal: Soft. There is no tenderness.  Genitourinary:       External genitalia without lesions. White discharge vaginal vault. Positive CMT, right adnexal tenderness. Unable to palpate uterus due to patient habitus.  Musculoskeletal: Normal range of motion.  Neurological: She is alert and oriented to person, place, and time. No cranial nerve deficit.  Skin: Skin is warm and dry.  Psychiatric: She  has a normal mood and affect. Her behavior is normal. Judgment and thought content normal.   Results for orders placed during the hospital encounter of 05/13/11 (from the past 24 hour(s))  URINALYSIS, ROUTINE W REFLEX MICROSCOPIC     Status: Abnormal   Collection Time   05/13/11  7:25 PM      Component Value Range   Color, Urine YELLOW  YELLOW    APPearance CLEAR  CLEAR    Specific Gravity, Urine 1.010  1.005 - 1.030    pH 6.0  5.0 - 8.0    Glucose, UA NEGATIVE  NEGATIVE (mg/dL)   Hgb urine dipstick MODERATE (*)  NEGATIVE    Bilirubin Urine NEGATIVE  NEGATIVE    Ketones, ur NEGATIVE  NEGATIVE (mg/dL)   Protein, ur NEGATIVE  NEGATIVE (mg/dL)   Urobilinogen, UA 0.2  0.0 - 1.0 (mg/dL)   Nitrite NEGATIVE  NEGATIVE    Leukocytes, UA NEGATIVE  NEGATIVE   URINE MICROSCOPIC-ADD ON     Status: Normal   Collection Time   05/13/11  7:25 PM      Component Value Range   Squamous Epithelial / LPF RARE  RARE    RBC / HPF 3-6  <3 (RBC/hpf)  POCT PREGNANCY, URINE     Status: Abnormal   Collection Time   05/13/11  7:32 PM      Component Value Range   Preg Test, Ur POSITIVE (*) NEGATIVE   CBC     Status: Abnormal   Collection Time   05/13/11  7:55 PM      Component Value Range   WBC 12.8 (*) 4.0 - 10.5 (K/uL)   RBC 4.57  3.87 - 5.11 (MIL/uL)   Hemoglobin 12.4  12.0 - 15.0 (g/dL)   HCT 45.4  09.8 - 11.9 (%)   MCV 80.3  78.0 - 100.0 (fL)   MCH 27.1  26.0 - 34.0 (pg)   MCHC 33.8  30.0 - 36.0 (g/dL)   RDW 14.7  82.9 - 56.2 (%)   Platelets 236  150 - 400 (K/uL)  DIFFERENTIAL     Status: Abnormal   Collection Time   05/13/11  7:55 PM      Component Value Range   Neutrophils Relative 76  43 - 77 (%)   Neutro Abs 9.7 (*) 1.7 - 7.7 (K/uL)   Lymphocytes Relative 15  12 - 46 (%)   Lymphs Abs 1.9  0.7 - 4.0 (K/uL)   Monocytes Relative 7  3 - 12 (%)   Monocytes Absolute 0.9  0.1 - 1.0 (K/uL)   Eosinophils Relative 1  0 - 5 (%)   Eosinophils Absolute 0.2  0.0 - 0.7 (K/uL)   Basophils Relative 0  0 - 1 (%)   Basophils Absolute 0.0  0.0 - 0.1 (K/uL)  HCG, QUANTITATIVE, PREGNANCY     Status: Abnormal   Collection Time   05/13/11  7:55 PM      Component Value Range   hCG, Beta Chain, Quant, S 2269 (*) <5 (mIU/mL)  ABO/RH     Status: Normal   Collection Time   05/13/11  7:55 PM      Component Value Range   ABO/RH(D) O POS     Ultrasound shows a 5 week IUGS with YS. Assessment:  Pelvic pain in early pregnancy   CLC left   Bacterial vaginosis  Plan:  Start prenatal care   Tylenol for discomfort   Rx  Flagyl   Return as needed.  ED Course  Procedures    MDM

## 2011-05-13 NOTE — Discharge Instructions (Signed)
________________________________________     To schedule your Maternity Eligibility Appointment, please call [phone].  When you arrive for your appointment you must bring the following items or information listed below.  Your appointment will be rescheduled if you do not have these items or are 15 minutes late. If currently receiving Medicaid, you MUST bring: 1. Medicaid Card 2. Social Security Card 3. Picture ID 4. Proof of Pregnancy 5. Verification of current address if the address on Medicaid card is incorrect "postmarked mail" If not receiving Medicaid, you MUST bring: 1. Social Security Card 2. Picture ID 3. Birth Certificate (if available) Passport or *Green Card 4. Proof of Pregnancy 5. Verification of current address "postmarked mail" for each income presented. 6. Verification of insurance coverage, if any 7. Check stubs from each employer for the previous month (if unable to present check stub  for each week, we will accept check stub for the first and last week ill the same month.) If you can't locate check stubs, you must bring a letter from the employer(s) and it must have the following information on letterhead, typed, in English: o name of company o company telephone number o how long been with the company, if less than one month o how much person earns per hour o how many hours per week work o the gross pay the person earned for the previous month If you are 29 years old or less, you do not have to bring proof of income unless you work or live with the father of the baby and at that time we will need proof of income from you and/or the father of the baby. Green Card recipients are eligible for Medicaid for Pregnant Women (MPW)  Bacterial Vaginosis Bacterial vaginosis (BV) is a vaginal infection where the normal balance of bacteria in the vagina is disrupted. The normal balance is then replaced by an overgrowth of certain bacteria. There are several different  kinds of bacteria that can cause BV. BV is the most common vaginal infection in women of childbearing age. CAUSES   The cause of BV is not fully understood. BV develops when there is an increase or imbalance of harmful bacteria.   Some activities or behaviors can upset the normal balance of bacteria in the vagina and put women at increased risk including:   Having a new sex partner or multiple sex partners.   Douching.   Using an intrauterine device (IUD) for contraception.   It is not clear what role sexual activity plays in the development of BV. However, women that have never had sexual intercourse are rarely infected with BV.  Women do not get BV from toilet seats, bedding, swimming pools or from touching objects around them.  SYMPTOMS   Grey vaginal discharge.   A fish-like odor with discharge, especially after sexual intercourse.   Itching or burning of the vagina and vulva.   Burning or pain with urination.   Some women have no signs or symptoms at all.  DIAGNOSIS  Your caregiver must examine the vagina for signs of BV. Your caregiver will perform lab tests and look at the sample of vaginal fluid through a microscope. They will look for bacteria and abnormal cells (clue cells), a pH test higher than 4.5, and a positive amine test all associated with BV.  RISKS AND COMPLICATIONS   Pelvic inflammatory disease (PID).   Infections following gynecology surgery.   Developing HIV.   Developing herpes virus.  TREATMENT  Sometimes BV will  clear up without treatment. However, all women with symptoms of BV should be treated to avoid complications, especially if gynecology surgery is planned. Female partners generally do not need to be treated. However, BV may spread between female sex partners so treatment is helpful in preventing a recurrence of BV.   BV may be treated with antibiotics. The antibiotics come in either pill or vaginal cream forms. Either can be used with nonpregnant  or pregnant women, but the recommended dosages differ. These antibiotics are not harmful to the baby.   BV can recur after treatment. If this happens, a second round of antibiotics will often be prescribed.   Treatment is important for pregnant women. If not treated, BV can cause a premature delivery, especially for a pregnant woman who had a premature birth in the past. All pregnant women who have symptoms of BV should be checked and treated.   For chronic reoccurrence of BV, treatment with a type of prescribed gel vaginally twice a week is helpful.  HOME CARE INSTRUCTIONS   Finish all medication as directed by your caregiver.   Do not have sex until treatment is completed.   Tell your sexual partner that you have a vaginal infection. They should see their caregiver and be treated if they have problems, such as a mild rash or itching.   Practice safe sex. Use condoms. Only have 1 sex partner.  PREVENTION  Basic prevention steps can help reduce the risk of upsetting the natural balance of bacteria in the vagina and developing BV:  Do not have sexual intercourse (be abstinent).   Do not douche.   Use all of the medicine prescribed for treatment of BV, even if the signs and symptoms go away.   Tell your sex partner if you have BV. That way, they can be treated, if needed, to prevent reoccurrence.  SEEK MEDICAL CARE IF:   Your symptoms are not improving after 3 days of treatment.   You have increased discharge, pain, or fever.  MAKE SURE YOU:   Understand these instructions.   Will watch your condition.   Will get help right away if you are not doing well or get worse.  FOR MORE INFORMATION  Division of STD Prevention (DSTDP), Centers for Disease Control and Prevention: [employer]s.[employer].za American Social Health Association (ASHA): www.ashastd.org  Document Released: 01/10/2005 Document Revised: 12/30/2010 Document Reviewed: 07/03/2008 [facility] Patient Information 2012 [place],  [place].

## 2011-05-13 NOTE — MAU Note (Signed)
Abdominal cramping, breast pain and headaches x3 weeks. Positive pregnancy test at home. Denies vaginal bleeding.

## 2011-05-14 LAB — GC/CHLAMYDIA PROBE AMP, GENITAL: GC Probe Amp, Genital: NEGATIVE

## 2011-05-25 ENCOUNTER — Inpatient Hospital Stay (HOSPITAL_COMMUNITY)
Admission: AD | Admit: 2011-05-25 | Discharge: 2011-05-25 | Disposition: A | Discharge status: Home / Self Care | Payer: Self-pay | Source: Ambulatory Visit | Attending: Obstetrics and Gynecology | Admitting: Obstetrics and Gynecology

## 2011-05-25 ENCOUNTER — Encounter (HOSPITAL_COMMUNITY): Payer: Self-pay | Admitting: *Deleted

## 2011-05-25 ENCOUNTER — Inpatient Hospital Stay (HOSPITAL_COMMUNITY): Payer: Self-pay

## 2011-05-25 DIAGNOSIS — O219 Vomiting of pregnancy, unspecified: Secondary | ICD-10-CM

## 2011-05-25 DIAGNOSIS — O3680X0 Pregnancy with inconclusive fetal viability, not applicable or unspecified: Secondary | ICD-10-CM

## 2011-05-25 DIAGNOSIS — O210 Mild hyperemesis gravidarum: Secondary | ICD-10-CM | POA: Insufficient documentation

## 2011-05-25 LAB — URINALYSIS, ROUTINE W REFLEX MICROSCOPIC
Glucose, UA: NEGATIVE mg/dL
Ketones, ur: 15 mg/dL — AB
Nitrite: NEGATIVE
Specific Gravity, Urine: 1.01 (ref 1.005–1.030)
pH: 7 (ref 5.0–8.0)

## 2011-05-25 LAB — URINE MICROSCOPIC-ADD ON

## 2011-05-25 MED ORDER — PROMETHAZINE HCL 25 MG RE SUPP: 25.0000 mg | Freq: Four times a day (QID) | RECTAL | Status: DC | PRN

## 2011-05-25 MED ORDER — ONDANSETRON 8 MG PO TBDP
8.0000 mg | ORAL_TABLET | Freq: Once | ORAL | Status: AC
  Administered 2011-05-25: 8 mg via ORAL
  Filled 2011-05-25: qty 1

## 2011-05-25 MED ORDER — ONDANSETRON HCL 8 MG PO TABS: 8.0000 mg | ORAL_TABLET | Freq: Three times a day (TID) | ORAL | Status: AC | PRN

## 2011-05-25 MED ORDER — PROMETHAZINE HCL 25 MG PO TABS: 25.0000 mg | ORAL_TABLET | Freq: Once | ORAL | Status: DC

## 2011-05-25 MED ORDER — PROMETHAZINE HCL 25 MG PO TABS
25.0000 mg | ORAL_TABLET | Freq: Once | ORAL | Status: AC
  Administered 2011-05-25: 25 mg via ORAL
  Filled 2011-05-25: qty 1

## 2011-05-25 NOTE — MAU Provider Note (Signed)
Amanda Xu Whittington31 y.o.G2P0010 @[redacted]w[redacted]d  by LMP Chief Complaint  Patient presents with  . Emesis During Pregnancy    None    SUBJECTIVE  HPI: Severe N/V. Vomited  X 3 within first 30 minutes of visit. Here 05/13/11. 5 week IU GS seen. No FP. Denies bleeding, abd pain.    Past Medical History  Diagnosis Date  . Asthma   . GERD (gastroesophageal reflux disease)   . Headache   . Anxiety   . Environmental allergies   . Acid reflux   . Hiatal hernia    Past Surgical History  Procedure Date  . Cervical cone biopsy   . Marsupialization urethral diverticulum    History   Social History  . Marital Status: Single    Spouse Name: N/A    Number of Children: N/A  . Years of Education: N/A   Occupational History  . Not on file.   Social History Main Topics  . Smoking status: Never Smoker   . Smokeless tobacco: Not on file  . Alcohol Use: No  . Drug Use: No  . Sexually Active: Yes    Birth Control/ Protection: None   Other Topics Concern  . Not on file   Social History Narrative  . No narrative on file   No current facility-administered medications on file prior to encounter.   Current Outpatient Prescriptions on File Prior to Encounter  Medication Sig Dispense Refill  . albuterol (PROVENTIL HFA;VENTOLIN HFA) 108 (90 BASE) MCG/ACT inhaler Inhale 2 puffs into the lungs every 6 (six) hours as needed. For shortness of breath/wheezing       . clonazePAM (KLONOPIN) 1 MG tablet Take 1 mg by mouth daily as needed. For anxiety      . HYDROcodone-acetaminophen (VICODIN) 5-500 MG per tablet Take 1 tablet by mouth every 6 (six) hours as needed. For pain.  15 tablet  0  . omeprazole (PRILOSEC) 20 MG capsule Take 40 mg by mouth 2 (two) times daily.         Allergies  Allergen Reactions  . Doxycycline Hyclate Hives, Itching and Swelling    REACTION: swelling of face  . Ketorolac Tromethamine Other (See Comments)    Patient stated that it makes her extremely hyper  . Amoxicillin  Swelling and Rash    REACTION: facial swelling and rash.    ROS: Pertinent items in HPI  OBJECTIVE Blood pressure 114/66, temperature 98.2 F (36.8 C), temperature source Oral, resp. rate 20, height 5\' 3"  (1.6 m), weight 108.682 kg (239 lb 9.6 oz), last menstrual period 03/30/2011, SpO2 100.00%.  GENERAL: Well-developed, well-nourished female in no acute distress.  HEENT: Normocephalic, good dentition HEART: normal rate RESP: normal effort ABDOMEN: Soft, nontender EXTREMITIES: Nontender, no edema NEURO: Alert and oriented SPECULUM EXAM: deferred   LAB RESULTS Results for orders placed during the hospital encounter of 05/25/11 (from the past 24 hour(s))  URINALYSIS, ROUTINE W REFLEX MICROSCOPIC     Status: Abnormal   Collection Time   05/25/11  9:45 AM      Component Value Range   Color, Urine AMBER (*) YELLOW    APPearance HAZY (*) CLEAR    Specific Gravity, Urine 1.010  1.005 - 1.030    pH 7.0  5.0 - 8.0    Glucose, UA NEGATIVE  NEGATIVE (mg/dL)   Hgb urine dipstick NEGATIVE  NEGATIVE    Bilirubin Urine SMALL (*) NEGATIVE    Ketones, ur 15 (*) NEGATIVE (mg/dL)   Protein, ur NEGATIVE  NEGATIVE (  mg/dL)   Urobilinogen, UA 0.2  0.0 - 1.0 (mg/dL)   Nitrite NEGATIVE  NEGATIVE    Leukocytes, UA TRACE (*) NEGATIVE   URINE MICROSCOPIC-ADD ON     Status: Abnormal   Collection Time   05/25/11  9:45 AM      Component Value Range   Squamous Epithelial / LPF FEW (*) RARE    WBC, UA 0-2  <3 (WBC/hpf)   RBC / HPF 0-2  <3 (RBC/hpf)   Bacteria, UA FEW (*) RARE    Urine-Other MUCOUS PRESENT     No further vomiting. Nausea improved w/ Zofran. Requesting phenergan, given.  IMAGING [place]: SIUP, S=D, +FHTs  ASSESSMENT 1. Nausea/vomiting in pregnancy   2. 6.5 week IUP    PLAN D/C home Follow-up Information    Follow up with [facility] prenatal [facility].      Follow up with [facility]. (As needed if symptoms worsen)    Contact information:   [address] [place]  [place] [address] [phone]        Medication List  As of 05/25/2011 12:15 PM   START taking these medications         ondansetron 8 MG tablet   Commonly known as: ZOFRAN   Take 1 tablet (8 mg total) by mouth every 8 (eight) hours as needed for nausea.      * promethazine 25 MG tablet   Commonly known as: PHENERGAN   Take 1 tablet (25 mg total) by mouth once.      * promethazine 25 MG suppository   Commonly known as: PHENERGAN   Place 1 suppository (25 mg total) rectally every 6 (six) hours as needed for nausea.     * Notice: This list has 2 medication(s) that are the same as other medications prescribed for you. Read the directions carefully, and ask your doctor or other care provider to review them with you.       CONTINUE taking these medications         albuterol 108 (90 BASE) MCG/ACT inhaler   Commonly known as: PROVENTIL HFA;VENTOLIN HFA         STOP taking these medications         clonazePAM 1 MG tablet          Where to get your medications    These are the prescriptions that you need to pick up.   You may get these medications from any pharmacy.         ondansetron 8 MG tablet   promethazine 25 MG suppository   promethazine 25 MG tablet          Start prenatal care  [place], [place] 05/25/2011 9:51 AM

## 2011-05-25 NOTE — Discharge Instructions (Signed)
Prenatal Care Providers °[facility]    [facility] ° Phone- [phone]     Phone: [phone] °         °[facility]                      [facility] ° @[place]     Phone: [phone] ° Phone: [phone] °        [facility] °[facility]     Phone: [phone] ° Phone: [phone]   °        [facility] °[facility] [place]                hone: [phone] ° Phone: [phone] °        [facility] °Dr. Bernard Marshall      Phone: [phone] ° Phone: [phone] °        [facility] °[facility]                Phone: [phone] ° [facility]’s [facility]  ° Phone:[phone]    [facility] °         Phone: [phone] °[facility] °  Phone: [phone] °

## 2011-05-25 NOTE — MAU Note (Signed)
Patient states she has been nauseated for about 2 weeks, for the past week has been unable to keep anything down. Denies any pain or bleeding.

## 2011-05-25 NOTE — MAU Note (Signed)
Pt in c/o nausea and vomiting x2 weeks.  States she is drained emotionally and physically.  Is able to keep water down.  Unable to keep any food down.  Denies any pain or bleeding.

## 2011-05-30 NOTE — MAU Provider Note (Signed)
Agree with above note.  Yaffa Seckman 05/30/2011 2:24 PM   

## 2011-06-22 ENCOUNTER — Other Ambulatory Visit: Payer: Self-pay

## 2011-06-28 ENCOUNTER — Other Ambulatory Visit (HOSPITAL_COMMUNITY): Payer: Self-pay | Admitting: Obstetrics and Gynecology

## 2011-06-28 DIAGNOSIS — Z3682 Encounter for antenatal screening for nuchal translucency: Secondary | ICD-10-CM

## 2011-06-28 LAB — OB RESULTS CONSOLE RPR: RPR: NONREACTIVE

## 2011-06-28 LAB — OB RESULTS CONSOLE ABO/RH: RH Type: POSITIVE

## 2011-07-05 ENCOUNTER — Encounter (HOSPITAL_COMMUNITY): Payer: Self-pay

## 2011-07-05 ENCOUNTER — Ambulatory Visit (HOSPITAL_COMMUNITY): Admission: RE | Admit: 2011-07-05 | Payer: Medicaid Other | Source: Ambulatory Visit

## 2011-07-05 ENCOUNTER — Other Ambulatory Visit (HOSPITAL_COMMUNITY): Payer: Self-pay | Admitting: Obstetrics and Gynecology

## 2011-07-05 ENCOUNTER — Ambulatory Visit (HOSPITAL_COMMUNITY)
Admission: RE | Admit: 2011-07-05 | Discharge: 2011-07-05 | Disposition: A | Discharge status: Home / Self Care | Payer: Medicaid Other | Source: Ambulatory Visit | Attending: Obstetrics and Gynecology | Admitting: Obstetrics and Gynecology

## 2011-07-05 VITALS — BP 112/66 | HR 86 | Wt 236.0 lb

## 2011-07-05 DIAGNOSIS — O351XX0 Maternal care for (suspected) chromosomal abnormality in fetus, not applicable or unspecified: Secondary | ICD-10-CM | POA: Insufficient documentation

## 2011-07-05 DIAGNOSIS — Z3682 Encounter for antenatal screening for nuchal translucency: Secondary | ICD-10-CM

## 2011-07-05 DIAGNOSIS — Z3689 Encounter for other specified antenatal screening: Secondary | ICD-10-CM | POA: Insufficient documentation

## 2011-07-05 DIAGNOSIS — O3510X0 Maternal care for (suspected) chromosomal abnormality in fetus, unspecified, not applicable or unspecified: Secondary | ICD-10-CM | POA: Insufficient documentation

## 2011-07-12 ENCOUNTER — Other Ambulatory Visit (HOSPITAL_COMMUNITY): Payer: Self-pay | Admitting: Maternal and Fetal Medicine

## 2011-07-12 ENCOUNTER — Other Ambulatory Visit: Payer: Self-pay

## 2011-07-12 ENCOUNTER — Encounter (HOSPITAL_COMMUNITY): Payer: Self-pay

## 2011-07-12 ENCOUNTER — Ambulatory Visit (HOSPITAL_COMMUNITY)
Admission: RE | Admit: 2011-07-12 | Discharge: 2011-07-12 | Disposition: A | Discharge status: Home / Self Care | Payer: Medicaid Other | Source: Ambulatory Visit | Attending: Obstetrics and Gynecology | Admitting: Obstetrics and Gynecology

## 2011-07-12 DIAGNOSIS — E669 Obesity, unspecified: Secondary | ICD-10-CM | POA: Insufficient documentation

## 2011-07-12 DIAGNOSIS — Z3682 Encounter for antenatal screening for nuchal translucency: Secondary | ICD-10-CM

## 2011-07-12 DIAGNOSIS — O351XX0 Maternal care for (suspected) chromosomal abnormality in fetus, not applicable or unspecified: Secondary | ICD-10-CM | POA: Insufficient documentation

## 2011-07-12 DIAGNOSIS — Z3689 Encounter for other specified antenatal screening: Secondary | ICD-10-CM | POA: Insufficient documentation

## 2011-07-12 DIAGNOSIS — O3510X0 Maternal care for (suspected) chromosomal abnormality in fetus, unspecified, not applicable or unspecified: Secondary | ICD-10-CM | POA: Insufficient documentation

## 2011-07-12 NOTE — Progress Notes (Signed)
Patient seen today  for ultrasound.  See full report in [facility].  Alpha Gula, MD  Single IUP at 13 3/7 weeks First trimester screen performed- NT of 1.1 mm noted Nasal bone visualized  Recommend ultrasound for fetal anatomy at 18 weeks - otherwise follow up as clinically indicated.

## 2011-10-31 ENCOUNTER — Inpatient Hospital Stay (HOSPITAL_COMMUNITY)
Admission: AD | Admit: 2011-10-31 | Discharge: 2011-10-31 | Disposition: A | Discharge status: Home / Self Care | Payer: Medicaid Other | Source: Ambulatory Visit | Attending: Obstetrics and Gynecology | Admitting: Obstetrics and Gynecology

## 2011-10-31 ENCOUNTER — Encounter (HOSPITAL_COMMUNITY): Payer: Self-pay | Admitting: *Deleted

## 2011-10-31 DIAGNOSIS — K5289 Other specified noninfective gastroenteritis and colitis: Secondary | ICD-10-CM

## 2011-10-31 DIAGNOSIS — O99891 Other specified diseases and conditions complicating pregnancy: Secondary | ICD-10-CM | POA: Insufficient documentation

## 2011-10-31 DIAGNOSIS — O212 Late vomiting of pregnancy: Secondary | ICD-10-CM | POA: Insufficient documentation

## 2011-10-31 DIAGNOSIS — K529 Noninfective gastroenteritis and colitis, unspecified: Secondary | ICD-10-CM

## 2011-10-31 DIAGNOSIS — R197 Diarrhea, unspecified: Secondary | ICD-10-CM | POA: Insufficient documentation

## 2011-10-31 DIAGNOSIS — R109 Unspecified abdominal pain: Secondary | ICD-10-CM | POA: Insufficient documentation

## 2011-10-31 LAB — URINALYSIS, ROUTINE W REFLEX MICROSCOPIC
Bilirubin Urine: NEGATIVE
Glucose, UA: NEGATIVE mg/dL
Hgb urine dipstick: NEGATIVE
Protein, ur: NEGATIVE mg/dL
Specific Gravity, Urine: 1.015 (ref 1.005–1.030)

## 2011-10-31 MED ORDER — DEXTROSE 5 % IN LACTATED RINGERS IV BOLUS
1000.0000 mL | Freq: Once | INTRAVENOUS | Status: AC
  Administered 2011-10-31: 1000 mL via INTRAVENOUS

## 2011-10-31 MED ORDER — ACETAMINOPHEN 500 MG PO TABS
1000.0000 mg | ORAL_TABLET | Freq: Once | ORAL | Status: AC
  Administered 2011-10-31: 1000 mg via ORAL
  Filled 2011-10-31: qty 2

## 2011-10-31 MED ORDER — ONDANSETRON HCL 4 MG/2ML IJ SOLN
4.0000 mg | Freq: Once | INTRAMUSCULAR | Status: AC
  Administered 2011-10-31: 4 mg via INTRAVENOUS
  Filled 2011-10-31: qty 2

## 2011-10-31 MED ORDER — ONDANSETRON 8 MG PO TBDP: 8.0000 mg | ORAL_TABLET | Freq: Three times a day (TID) | ORAL | Status: DC | PRN

## 2011-10-31 MED ORDER — DIPHENOXYLATE-ATROPINE 2.5-0.025 MG PO TABS: 1.0000 | ORAL_TABLET | Freq: Four times a day (QID) | ORAL | Status: DC | PRN

## 2011-10-31 NOTE — MAU Note (Signed)
Pt woke up with vomiting and diarrhea, headache, back ache, and three days ago she had nasal congestion, runny nose, sore throat and a cough that has subsided.

## 2011-10-31 NOTE — MAU Provider Note (Signed)
History     CSN: [id number]  Arrival date and time: 10/31/11 1741   None     No chief complaint on file.  HPI 31 y.o. G2P0010 at [redacted]w[redacted]d with n/v and diarrhea since midnight. Hasn't tried any meds. No fever, chills. Burning abdominal pain. No bleeding, LOF or contractions. Uncomplicated prenatal course.    Past Medical History  Diagnosis Date  . Asthma   . GERD (gastroesophageal reflux disease)   . Headache   . Anxiety   . Environmental allergies   . Acid reflux   . Hiatal hernia     Past Surgical History  Procedure Date  . Cervical cone biopsy   . Marsupialization urethral diverticulum     Family History  Problem Relation Age of Onset  . Diabetes Mother   . Hypertension Mother   . Depression Mother   . Depression Father   . Anesthesia problems Neg Hx     History  Substance Use Topics  . Smoking status: Never Smoker   . Smokeless tobacco: Not on file  . Alcohol Use: No    Allergies:  Allergies  Allergen Reactions  . Doxycycline Hyclate Hives, Itching and Swelling    REACTION: swelling of face  . Ketorolac Tromethamine Other (See Comments)    Patient stated that it makes her extremely hyper  . Amoxicillin Swelling and Rash    REACTION: facial swelling and rash.    Prescriptions prior to admission  Medication Sig Dispense Refill  . albuterol (PROVENTIL HFA;VENTOLIN HFA) 108 (90 BASE) MCG/ACT inhaler Inhale 2 puffs into the lungs every 6 (six) hours as needed. For shortness of breath/wheezing       . promethazine (PHENERGAN) 25 MG tablet Take 25 mg by mouth every 6 (six) hours as needed. For nausea and vomiting      . promethazine (PHENERGAN) 25 MG suppository Place 1 suppository (25 mg total) rectally every 6 (six) hours as needed for nausea.  12 each  2  . promethazine (PHENERGAN) 25 MG tablet Take 1 tablet (25 mg total) by mouth once.  30 tablet  1    Review of Systems  Constitutional: Positive for malaise/fatigue. Negative for fever and chills.    Respiratory: Negative.   Cardiovascular: Negative.   Gastrointestinal: Positive for nausea, vomiting, abdominal pain and diarrhea. Negative for constipation.  Genitourinary: Negative for dysuria, urgency, frequency, hematuria and flank pain.       Negative for vaginal bleeding, cramping/contractions  Musculoskeletal: Negative.   Neurological: Negative.   Psychiatric/Behavioral: Negative.    Physical Exam   Blood pressure 117/46, pulse 84, temperature 99.2 F (37.3 C), temperature source Oral, resp. rate 16, last menstrual period 03/30/2011.  Physical Exam  Nursing note and vitals reviewed. Constitutional: She is oriented to person, place, and time. She appears well-developed and well-nourished. No distress.  Cardiovascular: Normal rate.   Respiratory: Effort normal.  GI: Soft. There is tenderness (mild, diffuse).  Musculoskeletal: Normal range of motion.  Neurological: She is alert and oriented to person, place, and time.  Skin: Skin is warm and dry.  Psychiatric: She has a normal mood and affect.   EFM: 150, mod variability, + accels, TOCO quiet MAU Course  Procedures  Results for orders placed during the hospital encounter of 10/31/11 (from the past 48 hour(s))  URINALYSIS, ROUTINE W REFLEX MICROSCOPIC     Status: Abnormal   Collection Time   10/31/11  6:00 PM      Component Value Range Comment   Color, Urine  YELLOW  YELLOW    APPearance CLEAR  CLEAR    Specific Gravity, Urine 1.015  1.005 - 1.030    pH 6.0  5.0 - 8.0    Glucose, UA NEGATIVE  NEGATIVE mg/dL    Hgb urine dipstick NEGATIVE  NEGATIVE    Bilirubin Urine NEGATIVE  NEGATIVE    Ketones, ur 15 (*) NEGATIVE mg/dL    Protein, ur NEGATIVE  NEGATIVE mg/dL    Urobilinogen, UA 0.2  0.0 - 1.0 mg/dL    Nitrite NEGATIVE  NEGATIVE    Leukocytes, UA NEGATIVE  NEGATIVE MICROSCOPIC NOT DONE ON URINES WITH NEGATIVE PROTEIN, BLOOD, LEUKOCYTES, NITRITE, OR GLUCOSE <1000 mg/dL.   Meds ordered this encounter  Medications  .  dextrose 5% lactated ringers bolus 1,000 mL    Sig:   . ondansetron (ZOFRAN) injection 4 mg    Sig:   . promethazine (PHENERGAN) 25 MG tablet    Sig: Take 25 mg by mouth every 6 (six) hours as needed. For nausea and vomiting  . acetaminophen (TYLENOL) tablet 1,000 mg    Sig:     Nausea resolved with Zofran.   Assessment and Plan   1. Gastroenteritis       Medication List     As of 10/31/2011  7:55 PM    START taking these medications         diphenoxylate-atropine 2.5-0.025 MG per tablet   Commonly known as: LOMOTIL   Take 1 tablet by mouth 4 (four) times daily as needed for diarrhea or loose stools.      ondansetron 8 MG disintegrating tablet   Commonly known as: ZOFRAN-ODT   Take 1 tablet (8 mg total) by mouth every 8 (eight) hours as needed for nausea.      CONTINUE taking these medications         albuterol 108 (90 BASE) MCG/ACT inhaler   Commonly known as: PROVENTIL HFA;VENTOLIN HFA      * promethazine 25 MG tablet   Commonly known as: PHENERGAN      * promethazine 25 MG tablet   Commonly known as: PHENERGAN   Take 1 tablet (25 mg total) by mouth once.      * promethazine 25 MG suppository   Commonly known as: PHENERGAN   Place 1 suppository (25 mg total) rectally every 6 (six) hours as needed for nausea.     * Notice: This list has 3 medication(s) that are the same as other medications prescribed for you. Read the directions carefully, and ask your doctor or other care provider to review them with you.        Where to get your medications    These are the prescriptions that you need to pick up. We sent them to a specific pharmacy, so you will need to go there to get them.   [id number] - [place], [place] - [address] AT [facility] [address]    [address] [place] [address]    Phone: [phone]    Hours: 24-hours        ondansetron 8 MG disintegrating tablet         You may get these medications  from any pharmacy.         diphenoxylate-atropine 2.5-0.025 MG per tablet            Follow-up Information    Follow up with [name], MD. (as scheduled)    Contact information:  [address], [address] [place] [address] [phone]            Amanda [name] 10/31/2011, 7:50 PM

## 2011-11-28 ENCOUNTER — Ambulatory Visit (INDEPENDENT_AMBULATORY_CARE_PROVIDER_SITE_OTHER): Payer: Medicaid Other | Admitting: Pediatrics

## 2011-11-28 DIAGNOSIS — Z7681 Expectant parent(s) prebirth pediatrician visit: Secondary | ICD-10-CM

## 2011-12-19 ENCOUNTER — Inpatient Hospital Stay (HOSPITAL_COMMUNITY)
Admission: AD | Admit: 2011-12-19 | Discharge: 2011-12-19 | Disposition: A | Discharge status: Home / Self Care | Payer: Medicaid Other | Source: Ambulatory Visit | Attending: Obstetrics and Gynecology | Admitting: Obstetrics and Gynecology

## 2011-12-19 ENCOUNTER — Encounter (HOSPITAL_COMMUNITY): Payer: Self-pay

## 2011-12-19 DIAGNOSIS — O368190 Decreased fetal movements, unspecified trimester, not applicable or unspecified: Secondary | ICD-10-CM

## 2011-12-19 DIAGNOSIS — R109 Unspecified abdominal pain: Secondary | ICD-10-CM | POA: Insufficient documentation

## 2011-12-19 NOTE — MAU Note (Signed)
Patient taken directly to room 4 in MAU from lobby c/o decreased fetal movement and stomach pain.

## 2011-12-19 NOTE — MAU Provider Note (Signed)
History     CSN: [id number]  Arrival date and time: 12/19/11 1612   First Provider Initiated Contact with Patient 12/19/11 1744      Chief Complaint  Patient presents with  . Abdominal Pain  . Decreased Fetal Movement   HPI THis is a 31 y.o. female at [redacted]w[redacted]d who presents for decreased fetal movement and upper abdominal pain. Denies N/V/D.  Pain is not associated with contractions. Hurts mostly when baby kicks, and generally is just "sore".  Saw Dr Ambrose Mantle today and states "I told him all this and he didn't care. He couldn't tell me what my cervix was or if the baby was head down.  And I want to know if I can get any more ultrasounds".  Wants to know what exercises she should be doing to get ready for labor.   OB History    Grav Para Term Preterm Abortions TAB SAB Ect Mult Living   2 0 0 0 1 0 1 0 0 0       Past Medical History  Diagnosis Date  . Asthma   . GERD (gastroesophageal reflux disease)   . Headache   . Anxiety   . Environmental allergies   . Acid reflux   . Hiatal hernia     Past Surgical History  Procedure Date  . Cervical cone biopsy   . Marsupialization urethral diverticulum     Family History  Problem Relation Age of Onset  . Diabetes Mother   . Hypertension Mother   . Depression Mother   . Depression Father   . Anesthesia problems Neg Hx     History  Substance Use Topics  . Smoking status: Never Smoker   . Smokeless tobacco: Not on file  . Alcohol Use: No    Allergies:  Allergies  Allergen Reactions  . Doxycycline Hyclate Hives, Itching and Swelling    REACTION: swelling of face  . Ketorolac Tromethamine Other (See Comments)    Patient stated that it makes her extremely hyper  . Amoxicillin Swelling and Rash    REACTION: facial swelling and rash.    Prescriptions prior to admission  Medication Sig Dispense Refill  . ondansetron (ZOFRAN ODT) 8 MG disintegrating tablet Take 1 tablet (8 mg total) by mouth every 8 (eight) hours as needed  for nausea.  20 tablet  0  . promethazine (PHENERGAN) 25 MG tablet Take 25 mg by mouth every 6 (six) hours as needed. For nausea and vomiting      . promethazine (PHENERGAN) 25 MG suppository Place 1 suppository (25 mg total) rectally every 6 (six) hours as needed for nausea.  12 each  2  . promethazine (PHENERGAN) 25 MG tablet Take 1 tablet (25 mg total) by mouth once.  30 tablet  1    ROS See HPI  Physical Exam   Blood pressure 108/63, pulse 109, temperature 98.7 F (37.1 C), temperature source Oral, resp. rate 18, height 5\' 3"  (1.6 m), weight 254 lb (115.214 kg), last menstrual period 03/30/2011, unknown if currently breastfeeding.  Physical Exam  Constitutional: She is oriented to person, place, and time. She appears well-developed and well-nourished. No distress.  Cardiovascular: Normal rate.   Respiratory: Effort normal.  GI: Soft. She exhibits no distension and no mass. There is no tenderness. There is no rebound and no guarding.  Genitourinary: Vagina normal and uterus normal. No vaginal discharge found.          Cervix closed/40%/-3/ballotable   Musculoskeletal: Normal range of motion.  Neurological: She is alert and oriented to person, place, and time.  Skin: Skin is warm and dry.  Psychiatric: She has a normal mood and affect.   FHR reactive Rare contractions  MAU Course  Procedures  Assessment and Plan  A:  SIUP at [redacted]w[redacted]d       Not in labor      Reassuring fetal heart rate pattern      Upper abd soreness, nontender to exam  P:  Discussed findings      Reviewed signs of labor      Discussed efficacy in exercise in preparing her for labor, but that it would not stimulate labor      Confirmed vertex with bedside US      Advised US exams are not usually ordered without a medical indication        [facility] 12/19/2011, 5:48 PM

## 2011-12-26 NOTE — Progress Notes (Signed)
FHT from 11-25 reviewed.  Borderline reactive NST, no significant decels, no reg ctx.

## 2012-01-18 ENCOUNTER — Inpatient Hospital Stay (HOSPITAL_COMMUNITY)
Admission: AD | Admit: 2012-01-18 | Discharge: 2012-01-22 | DRG: 766 | Disposition: A | Discharge status: Home / Self Care | Payer: Medicaid Other | Source: Ambulatory Visit | Attending: Obstetrics and Gynecology | Admitting: Obstetrics and Gynecology

## 2012-01-18 ENCOUNTER — Other Ambulatory Visit: Payer: Self-pay | Admitting: Obstetrics and Gynecology

## 2012-01-18 ENCOUNTER — Encounter (HOSPITAL_COMMUNITY): Payer: Self-pay | Admitting: *Deleted

## 2012-01-18 DIAGNOSIS — O480 Post-term pregnancy: Principal | ICD-10-CM | POA: Diagnosis present

## 2012-01-18 DIAGNOSIS — Z98891 History of uterine scar from previous surgery: Secondary | ICD-10-CM

## 2012-01-18 DIAGNOSIS — D4959 Neoplasm of unspecified behavior of other genitourinary organ: Secondary | ICD-10-CM | POA: Diagnosis present

## 2012-01-18 DIAGNOSIS — D259 Leiomyoma of uterus, unspecified: Secondary | ICD-10-CM | POA: Diagnosis present

## 2012-01-18 DIAGNOSIS — O34599 Maternal care for other abnormalities of gravid uterus, unspecified trimester: Secondary | ICD-10-CM | POA: Diagnosis present

## 2012-01-18 HISTORY — DX: Personal history of other diseases of the digestive system: Z87.19

## 2012-01-18 LAB — COMPREHENSIVE METABOLIC PANEL
Albumin: 2.7 g/dL — ABNORMAL LOW (ref 3.5–5.2)
CO2: 24 mEq/L (ref 19–32)
Calcium: 8.7 mg/dL (ref 8.4–10.5)
Chloride: 100 mEq/L (ref 96–112)
GFR calc Af Amer: >90 mL/min (ref >90)
Total Protein: 6.2 g/dL (ref 6.0–8.3)

## 2012-01-18 LAB — CBC
MCV: 80.8 fL (ref 78.0–100.0)
Platelets: 153 10*3/uL (ref 150–400)
RBC: 3.9 MIL/uL (ref 3.87–5.11)
WBC: 12.5 10*3/uL — ABNORMAL HIGH (ref 4.0–10.5)

## 2012-01-18 LAB — TYPE AND SCREEN: Antibody Screen: NEGATIVE

## 2012-01-18 MED ORDER — MISOPROSTOL 25 MCG QUARTER TABLET
25.0000 ug | ORAL_TABLET | ORAL | Status: DC | PRN
  Administered 2012-01-18 – 2012-01-19 (×3): 25 ug via VAGINAL
  Filled 2012-01-18 (×3): qty 0.25

## 2012-01-18 MED ORDER — LACTATED RINGERS IV SOLN
INTRAVENOUS | Status: DC
  Administered 2012-01-19 – 2012-01-20 (×5): via INTRAVENOUS

## 2012-01-18 MED ORDER — OXYCODONE-ACETAMINOPHEN 5-325 MG PO TABS: 1.0000 | ORAL_TABLET | ORAL | Status: DC | PRN

## 2012-01-18 MED ORDER — LIDOCAINE HCL (PF) 1 % IJ SOLN: 30.0000 mL | INTRAMUSCULAR | Status: DC | PRN

## 2012-01-18 MED ORDER — LACTATED RINGERS IV SOLN
500.0000 mL | INTRAVENOUS | Status: DC | PRN
  Administered 2012-01-19 – 2012-01-20 (×2): 300 mL via INTRAVENOUS

## 2012-01-18 NOTE — H&P (Signed)
Amanda [name], Amanda [name]       ACCOUNT NO.:  [id number]  MEDICAL RECORD NO.:  [id number]  LOCATION:  [id number]                          FACILITY:  [facility]  PHYSICIAN:  Malachi Pro. Ambrose Mantle, M.D. DATE OF BIRTH:  Jun 18, 1980  DATE OF ADMISSION:  12/19/2011 DATE OF DISCHARGE:  12/19/2011                             HISTORY & PHYSICAL   PRESENT ILLNESS:  This is a 31 year old black female, para 0-0-1-0, gravida 2, who is admitted for attempted induction of labor.  Her due date is January 10, 2012, by an ultrasound done at 11 weeks and 4 days on Jun 22, 2011.  Blood group and type O positive.  Negative antibody. Pap smear normal.  Rubella immune, RPR nonreactive.  Urine culture negative.  Hepatitis B surface antigen negative, HIV negative. Varicella immune, GC was positive at her first visit.  Chlamydia negative.  Cystic fibrosis screening was negative.  First trimester screening negative.  A 1-hour Glucola was 145, 3-hour GTT was 72, 130, 107 and 52.  GC was positive and the Chlamydia was negative.  In early pregnancy actually the GC was negative, Chlamydia was positive and the patient was treated with Zithromax.  The patient's prenatal course was complicated by many complaints of nausea, vomiting, pelvic pressure, feeling like she was unable to continue the pregnancy.  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  The patient's cervix has remained completely closed and uneffaced with the vertex very high.  She is admitted now to try to a ripen her cervix for induction of labor.  PAST MEDICAL HISTORY:  Reveals history of abnormal Pap smears.  She did have a missed abortion, had vaginal trichomoniasis.  PAST SURGICAL HISTORY:  In May 2001, she had a D and C, and conization for high-grade squamous intraepithelial lesion.  She had a D and C in 2007 for missed abortion.  She had a marsupialization of Bartholin abscess  in December, 2007.  MEDICATIONS:  The patient took prenatal vitamins, Phenergan and Zofran during pregnancy.  ALLERGIES:  She is allergic to amoxicillin,  doxycycline and Toradol caused her to be extremely hyper.  PREGNANCY HISTORY:  In 2007, missed abortion.  Patient works in Audiological [employer]. She denies alcohol, tobacco and illicit substance abuse, but she does admit to smoking marijuana some in early pregnancy.  PHYSICAL EXAMINATION:  GENERAL:  The patient is a well-developed, quite obese, black female, in no distress. VITAL SIGNS:  Blood pressure is 130/80, pulse is 80, weight is 261 pounds on a 5 foot 2 inch frame. HEART:  Normal size and sounds.  No murmurs. LUNGS:  Clear to auscultation. PELVIC:  Fundal height 39 cm.  Fetal heart tones normal.  Cervix 0 cm, 0% effaced, vertex at a -4.  IMPRESSION:  The patient is admitted for attempted cervical ripening and induction of labor.     Malachi Pro. Ambrose Mantle, M.D.     TFH/MEDQ  D:  01/18/2012  T:  01/18/2012  Job:  [id number]

## 2012-01-19 ENCOUNTER — Encounter (HOSPITAL_COMMUNITY): Payer: Self-pay | Admitting: *Deleted

## 2012-01-19 LAB — RPR: RPR Ser Ql: NONREACTIVE

## 2012-01-19 MED ORDER — ONDANSETRON HCL 4 MG/2ML IJ SOLN
4.0000 mg | Freq: Four times a day (QID) | INTRAMUSCULAR | Status: DC | PRN
  Administered 2012-01-20: 4 mg via INTRAVENOUS
  Filled 2012-01-19: qty 2

## 2012-01-19 MED ORDER — OXYTOCIN 40 UNITS IN LACTATED RINGERS INFUSION - SIMPLE MED
1.0000 m[IU]/min | INTRAVENOUS | Status: DC
  Administered 2012-01-19: 1 m[IU]/min via INTRAVENOUS
  Filled 2012-01-19: qty 1000

## 2012-01-19 MED ORDER — CITRIC ACID-SODIUM CITRATE 334-500 MG/5ML PO SOLN
30.0000 mL | ORAL | Status: DC | PRN
  Administered 2012-01-19 – 2012-01-20 (×2): 30 mL via ORAL
  Filled 2012-01-19 (×2): qty 15

## 2012-01-19 MED ORDER — LIDOCAINE HCL (PF) 1 % IJ SOLN
INTRAMUSCULAR | Status: DC | PRN
  Administered 2012-01-19 (×4): 4 mL

## 2012-01-19 MED ORDER — EPHEDRINE 5 MG/ML INJ
10.0000 mg | INTRAVENOUS | Status: DC | PRN
  Filled 2012-01-19: qty 4

## 2012-01-19 MED ORDER — EPHEDRINE 5 MG/ML INJ: 10.0000 mg | INTRAVENOUS | Status: DC | PRN

## 2012-01-19 MED ORDER — ACETAMINOPHEN 325 MG PO TABS
650.0000 mg | ORAL_TABLET | ORAL | Status: DC | PRN
  Administered 2012-01-19: 650 mg via ORAL
  Filled 2012-01-19: qty 2

## 2012-01-19 MED ORDER — FENTANYL 2.5 MCG/ML BUPIVACAINE 1/10 % EPIDURAL INFUSION (WH - ANES)
14.0000 mL/h | INTRAMUSCULAR | Status: DC
  Administered 2012-01-19: 14 mL/h via EPIDURAL
  Filled 2012-01-19: qty 125

## 2012-01-19 MED ORDER — PHENYLEPHRINE 40 MCG/ML (10ML) SYRINGE FOR IV PUSH (FOR BLOOD PRESSURE SUPPORT): 80.0000 ug | PREFILLED_SYRINGE | INTRAVENOUS | Status: DC | PRN

## 2012-01-19 MED ORDER — LACTATED RINGERS IV SOLN
500.0000 mL | Freq: Once | INTRAVENOUS | Status: AC
  Administered 2012-01-19: 500 mL via INTRAVENOUS

## 2012-01-19 MED ORDER — DIPHENHYDRAMINE HCL 50 MG/ML IJ SOLN: 12.5000 mg | INTRAMUSCULAR | Status: DC | PRN

## 2012-01-19 MED ORDER — PHENYLEPHRINE 40 MCG/ML (10ML) SYRINGE FOR IV PUSH (FOR BLOOD PRESSURE SUPPORT)
80.0000 ug | PREFILLED_SYRINGE | INTRAVENOUS | Status: DC | PRN
  Filled 2012-01-19: qty 5

## 2012-01-19 NOTE — Progress Notes (Signed)
Patient ID: Amanda [name], female   DOB: 02/11/1980, 31 y.o.   MRN: [id number] Pitocin is at 17 mu/ minute and the contractions are q 2-3 minutes. The cervix still will not admit my finger. The pt states her pain has significantly increased in the last hour.

## 2012-01-19 NOTE — Progress Notes (Signed)
Dr Jackelyn Knife notified of pt vag exam of ft/70/-2; uc's every 1-4 mins, with 3 uc's within 10 minutes and that Cytotec was not placed. Dr Jackelyn Knife acknowledged and stated to hold off until Dr Ambrose Mantle makes his rounds and reevaluate at that time.

## 2012-01-19 NOTE — Progress Notes (Signed)
Patient ID: Amanda [name], female   DOB: 12-08-1980, 31 y.o.   MRN: [id number] This pt is at 41 weeks and 2 days. She was admitted last night and had her first cytotec at 8 PM. The cervix is still closed and the vertex is at - 4 station. I tried ti insert a Foley catheter but the redundant vaginal walls precluded good visualization of the cervix. I used a glove to try to keep the vaginal walls out of the way. The cervix would not accept the catheter. Then I tried manually to insert the catheter but the cervix would not accept it. The cervix is closed so I cannot insert the amniohook. I removed the cytotec and will begin pitocin.

## 2012-01-19 NOTE — Progress Notes (Signed)
Patient ID: Amanda [name], female   DOB: 09-07-1980, 31 y.o.   MRN: [id number] Pt requested and received an epidural and now is comfortable. The cervix is a tight FT and 20-30 % effaced and the vertex is at - 3 station. AROM produced lightly meconium stained fluid. Pitocin will be increased to 19 mu/ minute.

## 2012-01-19 NOTE — Progress Notes (Signed)
Patient ID: Amanda [name], female   DOB: 1980-11-09, 31 y.o.   MRN: [id number] The pt continues to have some mild intermittent decelerations with good variability. The cervix is FT dilated, maybe 50 % effaced and the vertex is at - 3 station.

## 2012-01-19 NOTE — Anesthesia Procedure Notes (Signed)
Epidural Patient location during procedure: OB Start time: 01/19/2012 8:53 PM  Staffing Performed by: anesthesiologist   Preanesthetic Checklist Completed: patient identified, site marked, surgical consent, pre-op evaluation, timeout performed, IV checked, risks and benefits discussed and monitors and equipment checked  Epidural Patient position: sitting Prep: site prepped and draped and DuraPrep Patient monitoring: continuous pulse ox and blood pressure Approach: midline Injection technique: LOR air  Needle:  Needle type: Tuohy  Needle gauge: 17 G Needle length: 9 cm and 9 Needle insertion depth: 8 cm Catheter type: closed end flexible Catheter size: 19 Gauge Catheter at skin depth: 13 cm Test dose: negative  Assessment Events: blood not aspirated, injection not painful, no injection resistance, negative IV test and no paresthesia  Additional Notes Discussed risk of headache, infection, bleeding, nerve injury and failed or incomplete block.  Patient voices understanding and wishes to proceed.  Epidural placed easily.  Patient tolerated procedure well, no apparent complications.  Jasmine December, MDReason for block:procedure for pain

## 2012-01-19 NOTE — H&P (Signed)
I discussed pitocin orders with Dr. Ambrose Mantle. He was notified that I did not feel comfortable starting the pitocin until after 4 hours that the last cytotec was placed. I discussed this with Lollie Marrow (charge RN) and she agrees with this. Dr. Ambrose Mantle did not agree, because he saw the cytotec fall out during an attempt to place foley bulb, however he was willing to wait the 4 hours. Will plan to start pitocin at 1315 as this will be 4 hours since cytotec was placed.

## 2012-01-19 NOTE — Anesthesia Preprocedure Evaluation (Addendum)
Anesthesia Evaluation  Patient identified by MRN, date of birth, ID band Patient awake    Reviewed: Allergy & Precautions, H&P , NPO status , Patient's Chart, lab work & pertinent test results, reviewed documented beta blocker date and time   History of Anesthesia Complications Negative for: history of anesthetic complications  Airway Mallampati: III TM Distance: >3 FB Neck ROM: full    Dental  (+) Teeth Intact   Pulmonary asthma (last inhaler use 2 weeks ago, uses only with colds) ,  breath sounds clear to auscultation        Cardiovascular negative cardio ROS  Rhythm:regular Rate:Normal     Neuro/Psych  Headaches, PSYCHIATRIC DISORDERS (depression/anxiety) negative psych ROS   GI/Hepatic Neg liver ROS, hiatal hernia, GERD-  ,  Endo/Other  Morbid obesity  Renal/GU negative Renal ROS     Musculoskeletal   Abdominal   Peds  Hematology  (+) anemia ,   Anesthesia Other Findings   Reproductive/Obstetrics (+) Pregnancy (NRFS, FTP)                          Anesthesia Physical Anesthesia Plan  ASA: III and emergent  Anesthesia Plan: Epidural   Post-op Pain Management:    Induction:   Airway Management Planned:   Additional Equipment:   Intra-op Plan:   Post-operative Plan:   Informed Consent: I have reviewed the patients History and Physical, chart, labs and discussed the procedure including the risks, benefits and alternatives for the proposed anesthesia with the patient or authorized representative who has indicated his/her understanding and acceptance.     Plan Discussed with:   Anesthesia Plan Comments:        Anesthesia Quick Evaluation

## 2012-01-19 NOTE — Progress Notes (Signed)
Patient ID: Amanda [name], female   DOB: 08/08/1980, 31 y.o.   MRN: [id number] Pitocin is at 15 mu/ minute and the contractions are somewhat irregular and not painful The cervix is not dilated but the external os seems to be scarred. I can get my FT through the external os only.

## 2012-01-20 ENCOUNTER — Encounter (HOSPITAL_COMMUNITY)
Admission: AD | Disposition: A | Discharge status: Home / Self Care | Payer: Self-pay | Source: Ambulatory Visit | Attending: Obstetrics and Gynecology

## 2012-01-20 ENCOUNTER — Encounter (HOSPITAL_COMMUNITY): Payer: Self-pay | Admitting: Family Medicine

## 2012-01-20 ENCOUNTER — Inpatient Hospital Stay (HOSPITAL_COMMUNITY): Payer: Medicaid Other | Admitting: Anesthesiology

## 2012-01-20 ENCOUNTER — Encounter (HOSPITAL_COMMUNITY): Payer: Self-pay | Admitting: Anesthesiology

## 2012-01-20 SURGERY — Surgical Case
Anesthesia: Epidural

## 2012-01-20 MED ORDER — LACTATED RINGERS IV SOLN
INTRAVENOUS | Status: AC
  Administered 2012-01-20 (×2): via INTRAVENOUS

## 2012-01-20 MED ORDER — SCOPOLAMINE 1 MG/3DAYS TD PT72
MEDICATED_PATCH | TRANSDERMAL | Status: AC
  Administered 2012-01-20: 1.5 mg via TRANSDERMAL
  Filled 2012-01-20: qty 1

## 2012-01-20 MED ORDER — SENNOSIDES-DOCUSATE SODIUM 8.6-50 MG PO TABS
2.0000 | ORAL_TABLET | Freq: Every day | ORAL | Status: DC
  Administered 2012-01-20 – 2012-01-21 (×2): 2 via ORAL

## 2012-01-20 MED ORDER — LIDOCAINE HCL (CARDIAC) 20 MG/ML IV SOLN
INTRAVENOUS | Status: AC
  Filled 2012-01-20: qty 5

## 2012-01-20 MED ORDER — SCOPOLAMINE 1 MG/3DAYS TD PT72
1.0000 | MEDICATED_PATCH | Freq: Once | TRANSDERMAL | Status: DC
  Administered 2012-01-20: 1.5 mg via TRANSDERMAL

## 2012-01-20 MED ORDER — MEASLES, MUMPS & RUBELLA VAC ~~LOC~~ INJ: 0.5000 mL | INJECTION | Freq: Once | SUBCUTANEOUS | Status: DC

## 2012-01-20 MED ORDER — METOCLOPRAMIDE HCL 5 MG/ML IJ SOLN: 10.0000 mg | Freq: Three times a day (TID) | INTRAMUSCULAR | Status: DC | PRN

## 2012-01-20 MED ORDER — FENTANYL CITRATE 0.05 MG/ML IJ SOLN
INTRAMUSCULAR | Status: AC
  Filled 2012-01-20: qty 2

## 2012-01-20 MED ORDER — EPHEDRINE 5 MG/ML INJ
INTRAVENOUS | Status: AC
  Filled 2012-01-20: qty 10

## 2012-01-20 MED ORDER — PHENYLEPHRINE 40 MCG/ML (10ML) SYRINGE FOR IV PUSH (FOR BLOOD PRESSURE SUPPORT)
PREFILLED_SYRINGE | INTRAVENOUS | Status: AC
  Filled 2012-01-20: qty 5

## 2012-01-20 MED ORDER — NALOXONE HCL 0.4 MG/ML IJ SOLN: 0.4000 mg | INTRAMUSCULAR | Status: DC | PRN

## 2012-01-20 MED ORDER — OXYCODONE-ACETAMINOPHEN 5-325 MG PO TABS
1.0000 | ORAL_TABLET | ORAL | Status: DC | PRN
  Administered 2012-01-20 – 2012-01-21 (×2): 2 via ORAL
  Administered 2012-01-21: 1 via ORAL
  Administered 2012-01-21 (×2): 2 via ORAL
  Administered 2012-01-21: 1 via ORAL
  Administered 2012-01-22 (×3): 2 via ORAL
  Filled 2012-01-20 (×2): qty 2
  Filled 2012-01-20: qty 1
  Filled 2012-01-20 (×5): qty 2
  Filled 2012-01-20: qty 1

## 2012-01-20 MED ORDER — DIPHENHYDRAMINE HCL 50 MG/ML IJ SOLN: 25.0000 mg | INTRAMUSCULAR | Status: DC | PRN

## 2012-01-20 MED ORDER — SODIUM BICARBONATE 8.4 % IV SOLN
INTRAVENOUS | Status: AC
  Filled 2012-01-20: qty 50

## 2012-01-20 MED ORDER — DIBUCAINE 1 % RE OINT: 1.0000 "application " | TOPICAL_OINTMENT | RECTAL | Status: DC | PRN

## 2012-01-20 MED ORDER — ONDANSETRON HCL 4 MG/2ML IJ SOLN
INTRAMUSCULAR | Status: AC
  Filled 2012-01-20: qty 2

## 2012-01-20 MED ORDER — INFLUENZA VIRUS VACC SPLIT PF IM SUSP: 0.5000 mL | INTRAMUSCULAR | Status: AC

## 2012-01-20 MED ORDER — ONDANSETRON HCL 4 MG/2ML IJ SOLN: 4.0000 mg | Freq: Three times a day (TID) | INTRAMUSCULAR | Status: DC | PRN

## 2012-01-20 MED ORDER — ONDANSETRON HCL 4 MG/2ML IJ SOLN: 4.0000 mg | INTRAMUSCULAR | Status: DC | PRN

## 2012-01-20 MED ORDER — NALOXONE HCL 1 MG/ML IJ SOLN: 1.0000 ug/kg/h | INTRAVENOUS | Status: DC | PRN

## 2012-01-20 MED ORDER — FENTANYL CITRATE 0.05 MG/ML IJ SOLN
INTRAMUSCULAR | Status: DC | PRN
  Administered 2012-01-20 (×2): 50 ug via INTRAVENOUS

## 2012-01-20 MED ORDER — MEPERIDINE HCL 25 MG/ML IJ SOLN
INTRAMUSCULAR | Status: DC | PRN
  Administered 2012-01-20 (×2): 12.5 mg via INTRAVENOUS

## 2012-01-20 MED ORDER — MEPERIDINE HCL 25 MG/ML IJ SOLN: 6.2500 mg | INTRAMUSCULAR | Status: DC | PRN

## 2012-01-20 MED ORDER — FENTANYL CITRATE 0.05 MG/ML IJ SOLN: 25.0000 ug | INTRAMUSCULAR | Status: DC | PRN

## 2012-01-20 MED ORDER — MEPERIDINE HCL 25 MG/ML IJ SOLN
INTRAMUSCULAR | Status: AC
  Filled 2012-01-20: qty 1

## 2012-01-20 MED ORDER — OXYTOCIN 10 UNIT/ML IJ SOLN
40.0000 [IU] | INTRAVENOUS | Status: DC | PRN
  Administered 2012-01-20: 40 [IU] via INTRAVENOUS

## 2012-01-20 MED ORDER — DIPHENHYDRAMINE HCL 25 MG PO CAPS
25.0000 mg | ORAL_CAPSULE | ORAL | Status: DC | PRN
  Administered 2012-01-20: 25 mg via ORAL
  Filled 2012-01-20: qty 1

## 2012-01-20 MED ORDER — ONDANSETRON HCL 4 MG/2ML IJ SOLN
INTRAMUSCULAR | Status: DC | PRN
  Administered 2012-01-20: 4 mg via INTRAVENOUS

## 2012-01-20 MED ORDER — ZOLPIDEM TARTRATE 5 MG PO TABS: 5.0000 mg | ORAL_TABLET | Freq: Every evening | ORAL | Status: DC | PRN

## 2012-01-20 MED ORDER — SIMETHICONE 80 MG PO CHEW: 80.0000 mg | CHEWABLE_TABLET | ORAL | Status: DC | PRN

## 2012-01-20 MED ORDER — ACETAMINOPHEN 10 MG/ML IV SOLN
1000.0000 mg | Freq: Four times a day (QID) | INTRAVENOUS | Status: DC
  Administered 2012-01-20: 1000 mg via INTRAVENOUS
  Filled 2012-01-20 (×4): qty 100

## 2012-01-20 MED ORDER — MORPHINE SULFATE (PF) 0.5 MG/ML IJ SOLN
INTRAMUSCULAR | Status: DC | PRN
  Administered 2012-01-20: 2000 ug via INTRAVENOUS
  Administered 2012-01-20: 3000 ug via EPIDURAL

## 2012-01-20 MED ORDER — GENTAMICIN SULFATE 40 MG/ML IJ SOLN
Freq: Once | INTRAVENOUS | Status: AC
  Administered 2012-01-20: 100 mL via INTRAVENOUS
  Filled 2012-01-20: qty 9.85

## 2012-01-20 MED ORDER — CLINDAMYCIN PHOSPHATE 900 MG/50ML IV SOLN
900.0000 mg | Freq: Three times a day (TID) | INTRAVENOUS | Status: AC
  Administered 2012-01-20 (×2): 900 mg via INTRAVENOUS
  Filled 2012-01-20 (×2): qty 50

## 2012-01-20 MED ORDER — MENTHOL 3 MG MT LOZG: 1.0000 | LOZENGE | OROMUCOSAL | Status: DC | PRN

## 2012-01-20 MED ORDER — IBUPROFEN 600 MG PO TABS
600.0000 mg | ORAL_TABLET | Freq: Four times a day (QID) | ORAL | Status: DC
  Administered 2012-01-20 – 2012-01-22 (×8): 600 mg via ORAL
  Filled 2012-01-20 (×8): qty 1

## 2012-01-20 MED ORDER — LACTATED RINGERS IV SOLN
INTRAVENOUS | Status: DC | PRN
  Administered 2012-01-20 (×2): via INTRAVENOUS

## 2012-01-20 MED ORDER — DIPHENHYDRAMINE HCL 50 MG/ML IJ SOLN
12.5000 mg | INTRAMUSCULAR | Status: DC | PRN
  Administered 2012-01-20: 10:00:00 via INTRAVENOUS
  Administered 2012-01-20: 12.5 mg via INTRAVENOUS
  Filled 2012-01-20: qty 1

## 2012-01-20 MED ORDER — TETANUS-DIPHTH-ACELL PERTUSSIS 5-2.5-18.5 LF-MCG/0.5 IM SUSP: 0.5000 mL | Freq: Once | INTRAMUSCULAR | Status: DC

## 2012-01-20 MED ORDER — DIPHENHYDRAMINE HCL 25 MG PO CAPS: 25.0000 mg | ORAL_CAPSULE | Freq: Four times a day (QID) | ORAL | Status: DC | PRN

## 2012-01-20 MED ORDER — OXYTOCIN 10 UNIT/ML IJ SOLN
INTRAMUSCULAR | Status: AC
  Filled 2012-01-20: qty 4

## 2012-01-20 MED ORDER — SODIUM BICARBONATE 8.4 % IV SOLN
INTRAVENOUS | Status: DC | PRN
  Administered 2012-01-20: 5 mL via EPIDURAL

## 2012-01-20 MED ORDER — MORPHINE SULFATE 0.5 MG/ML IJ SOLN
INTRAMUSCULAR | Status: AC
  Filled 2012-01-20: qty 10

## 2012-01-20 MED ORDER — PHENYLEPHRINE HCL 10 MG/ML IJ SOLN
INTRAMUSCULAR | Status: DC | PRN
  Administered 2012-01-20: 40 ug via INTRAVENOUS
  Administered 2012-01-20 (×3): 80 ug via INTRAVENOUS
  Administered 2012-01-20 (×3): 40 ug via INTRAVENOUS

## 2012-01-20 MED ORDER — LACTATED RINGERS IV SOLN
INTRAVENOUS | Status: DC | PRN
  Administered 2012-01-20: 05:00:00 via INTRAVENOUS
  Administered 2012-01-20: 1000 mL

## 2012-01-20 MED ORDER — NALBUPHINE HCL 10 MG/ML IJ SOLN
5.0000 mg | INTRAMUSCULAR | Status: DC | PRN
  Filled 2012-01-20: qty 1

## 2012-01-20 MED ORDER — SIMETHICONE 80 MG PO CHEW
80.0000 mg | CHEWABLE_TABLET | Freq: Three times a day (TID) | ORAL | Status: DC
  Administered 2012-01-20 – 2012-01-22 (×8): 80 mg via ORAL

## 2012-01-20 MED ORDER — OXYTOCIN 40 UNITS IN LACTATED RINGERS INFUSION - SIMPLE MED
62.5000 mL/h | INTRAVENOUS | Status: AC
  Administered 2012-01-20: 62.5 mL/h via INTRAVENOUS
  Filled 2012-01-20: qty 1000

## 2012-01-20 MED ORDER — WITCH HAZEL-GLYCERIN EX PADS: 1.0000 "application " | MEDICATED_PAD | CUTANEOUS | Status: DC | PRN

## 2012-01-20 MED ORDER — LANOLIN HYDROUS EX OINT: 1.0000 "application " | TOPICAL_OINTMENT | CUTANEOUS | Status: DC | PRN

## 2012-01-20 MED ORDER — ONDANSETRON HCL 4 MG PO TABS: 4.0000 mg | ORAL_TABLET | ORAL | Status: DC | PRN

## 2012-01-20 MED ORDER — SODIUM CHLORIDE 0.9 % IJ SOLN: 3.0000 mL | INTRAMUSCULAR | Status: DC | PRN

## 2012-01-20 SURGICAL SUPPLY — 35 items
CLOTH BEACON ORANGE TIMEOUT ST (SAFETY) ×2 IMPLANT
CONTAINER PREFILL 10% NBF 15ML (MISCELLANEOUS) IMPLANT
DRAPE LG THREE QUARTER DISP (DRAPES) ×2 IMPLANT
DRESSING TELFA 8X3 (GAUZE/BANDAGES/DRESSINGS) IMPLANT
DRSG OPSITE POSTOP 4X10 (GAUZE/BANDAGES/DRESSINGS) ×4 IMPLANT
DRSG VASELINE 3X18 (GAUZE/BANDAGES/DRESSINGS) ×2 IMPLANT
DURAPREP 26ML APPLICATOR (WOUND CARE) ×2 IMPLANT
ELECT REM PT RETURN 9FT ADLT (ELECTROSURGICAL) ×2
ELECTRODE REM PT RTRN 9FT ADLT (ELECTROSURGICAL) ×1 IMPLANT
EXTRACTOR VACUUM KIWI (MISCELLANEOUS) IMPLANT
EXTRACTOR VACUUM M CUP 4 TUBE (SUCTIONS) IMPLANT
GAUZE SPONGE 4X4 12PLY STRL LF (GAUZE/BANDAGES/DRESSINGS) ×4 IMPLANT
GAUZE VASELINE 3X9 (GAUZE/BANDAGES/DRESSINGS) ×2 IMPLANT
GLOVE BIO SURGEON STRL SZ7.5 (GLOVE) ×2 IMPLANT
GOWN PREVENTION PLUS LG XLONG (DISPOSABLE) ×4 IMPLANT
GOWN PREVENTION PLUS XLARGE (GOWN DISPOSABLE) ×2 IMPLANT
KIT ABG SYR 3ML LUER SLIP (SYRINGE) ×2 IMPLANT
NEEDLE HYPO 25X5/8 SAFETYGLIDE (NEEDLE) ×2 IMPLANT
NS IRRIG 1000ML POUR BTL (IV SOLUTION) ×2 IMPLANT
PACK C SECTION WH (CUSTOM PROCEDURE TRAY) ×2 IMPLANT
PAD ABD 7.5X8 STRL (GAUZE/BANDAGES/DRESSINGS) IMPLANT
PAD OB MATERNITY 4.3X12.25 (PERSONAL CARE ITEMS) ×2 IMPLANT
RTRCTR C-SECT PINK 25CM LRG (MISCELLANEOUS) ×2 IMPLANT
SLEEVE SCD COMPRESS KNEE MED (MISCELLANEOUS) ×2 IMPLANT
STAPLER VISISTAT 35W (STAPLE) ×2 IMPLANT
SUT PLAIN 0 NONE (SUTURE) IMPLANT
SUT VIC AB 0 CT1 36 (SUTURE) ×12 IMPLANT
SUT VIC AB 3-0 CTX 36 (SUTURE) ×2 IMPLANT
SUT VIC AB 3-0 SH 27 (SUTURE) ×1
SUT VIC AB 3-0 SH 27X BRD (SUTURE) ×1 IMPLANT
SUT VIC AB 4-0 KS 27 (SUTURE) IMPLANT
SUT VICRYL 0 TIES 12 18 (SUTURE) IMPLANT
TOWEL OR 17X24 6PK STRL BLUE (TOWEL DISPOSABLE) ×6 IMPLANT
TRAY FOLEY CATH 14FR (SET/KITS/TRAYS/PACK) IMPLANT
WATER STERILE IRR 1000ML POUR (IV SOLUTION) ×2 IMPLANT

## 2012-01-20 NOTE — Anesthesia Postprocedure Evaluation (Signed)
  Anesthesia Post-op Note  Patient: Amanda [name]  Procedure(s) Performed: Procedure(s) (LRB) with comments: CESAREAN SECTION (N/A)  Patient is awake, responsive, moving her legs, and has signs of resolution of her numbness. Pain and nausea are reasonably well controlled. Vital signs are stable and clinically acceptable. Oxygen saturation is clinically acceptable. There are no apparent anesthetic complications at this time. Patient is ready for discharge.

## 2012-01-20 NOTE — Op Note (Signed)
NAMELAREINA, [name]       ACCOUNT NO.:  [id number]  MEDICAL RECORD NO.:  [id number]  LOCATION:  [facility]                          FACILITY:  [facility]  PHYSICIAN:  Malachi Pro. Ambrose Mantle, M.D. DATE OF BIRTH:  1980-07-30  DATE OF PROCEDURE:  01/20/2012 DATE OF DISCHARGE:                              OPERATIVE REPORT   PREOPERATIVE DIAGNOSIS:  Intrauterine pregnancy at 41 weeks and 3 days with failure to progress in labor, failed induction.  POSTOPERATIVE DIAGNOSIS:  Intrauterine pregnancy at 41 weeks and 3 days with failure to progress in labor, failed induction with fibroids on the uterus.  OPERATION:  Low-transverse cervical C-section.  OPERATOR:  Malachi Pro. Ambrose Mantle, MD  ANESTHESIA:  Epidural anesthesia.  DESCRIPTION OF PROCEDURE:  The fetus was resuscitated from late deceleration that she had starting at about 2:12 a.m. and at that time, I called for the C-section, the fetal heart rate was completely normal. According to the RN, when the patient left her room, the fetal heart rate was 140.  However in the operating room, the fetal heart rate was found to be less than 100.  At one point, it appeared to be over 100, but then it was found again that the heart rate was less than 100, and so we did not use the DuraPrep, just use Betadine solution to prep.  The abdomen was draped as a sterile field.  The epidural had been boosted. It is unclear when the fetal heart rate bradycardia began because the fetal heart rate was normal when the patient left her room.  Anesthesia was confirmed, a transverse incision was made and carried in layers through the skin, subcutaneous tissue, and fascia.  The fascia was separated from the rectus muscles superiorly and inferiorly.  The rectus muscle was split in the midline.  Peritoneum opened vertically and a short transverse incision was made through the superficial layers of the myometrium, I went the rest of the way in the amniotic sac with my finger,  pulled superiorly and inferiorly.  Actually went through the placental edge, but the placenta seemed to be quite calcified.  I reached down in the pelvis, lifted the vertex out of the pelvis, delivered the vertex, suctioned the nose and mouth delivered the rest of the baby.  The infant seemed to be a little limp.  I clamped and cut the cord, took the baby over to the warmer.  The neonatologist was not in the room at that time.  I stimulated the baby by scratching the baby's back.  Cleaning the baby off and ask for Dr. Rodman Pickle to see if she could help take care of the baby.  Dr. Christella Scheuermann then entered the room.  He assigned the infant Apgars of 3 at 1 and 9 at 5 minutes.  He did not intubate the baby.  He gave some positive pressure oxygen for a few seconds.  The placenta was removed.  The inside of the uterus was inspected and found to be free of debris.  The uterine incision was then closed in 2 layers using a running lock suture of 0 Vicryl on the first layer, nonlocking suture of the same material on the second layer. There were 2 fibroids on the posterior aspect of  the uterus to the left of the midline that were probably 3-4 cm in diameter.  Both tubes and ovaries looked normal.  The may have been some filmy adhesions close to the tubes, liberal irrigation confirmed hemostasis.  The gutters were blotted free of blood and then the pack that I had used to try to keep the bowel out of way was removed, but the patient's bowel and omentum kept coming through the incisional opening making it impossible for me to close the abdominal wall.  I took quite a bit of time to try to wait for the bowel to relax.  The patient kept vomiting and also forcefully exhaling, pushing the bowel out through the unclosed part of the incision.  I finally was able with the help of some relaxation on the patient's part and the use of malleable retractors, I was finally able to close the abdominal wall with interrupted  sutures of 0 Vicryl on the rectus muscle and peritoneum in 1 layer.  Two running sutures of 0 Vicryl on the fascia, running 3-0 Vicryl on the subcutaneous tissue and staples on the skin.  The patient seemed to tolerate the procedure well. Blood loss was probably no more than a 1000 mL.  Sponge and needle counts were correct and she was returned to recovery in satisfactory condition.     Malachi Pro. Ambrose Mantle, M.D.     TFH/MEDQ  D:  01/20/2012  T:  01/20/2012  Job:  [id number]

## 2012-01-20 NOTE — Clinical Social Work Maternal (Signed)
Clinical Social Work Department  PSYCHOSOCIAL ASSESSMENT - MATERNAL/CHILD  01/20/2012  Patient: Amanda [name],Amanda [name] Account Number: [id number] Admit Date: 01/18/2012  Childs Name:  [name]   Clinical Social Worker: Aliesha Dolata, LCSW Date/Time: 01/20/2012 03:09 PM  Date Referred: 01/20/2012  Referral source   CN    Referred reason   Behavioral Health Issues   Substance Abuse   Other referral source:  I: FAMILY / HOME ENVIRONMENT  Child'[name] legal guardian: PARENT  Guardian - Name  Guardian - Age  Guardian - Address   Jacquie Westermeyer  31  [address]; [place] [address]   Doug Willer  32    Other household support members/support persons  Other support:  Jessica McNeil, best friend   II PSYCHOSOCIAL DATA  Information Source: Patient Interview  Financial and Community Resources  Employment:  Financial resources: Medicaid  If Medicaid - County: [place]  Other   Food Stamps   WIC   School / Grade:  Maternity Care Coordinator / Child Services Coordination / Early Interventions: Cultural issues impacting care:  III STRENGTHS  Strengths   Adequate Resources   Home prepared for Child (including basic supplies)   Supportive family/friends   Strength comment:  IV RISK FACTORS AND CURRENT PROBLEMS  Current Problem: YES  Risk Factor & Current Problem  Patient Issue  Family Issue  Risk Factor / Current Problem Comment   Substance Abuse  Y  N  Hx of MJ use   Mental Illness  Y  N  Hx anxiety/depression    N  N    V SOCIAL WORK ASSESSMENT  CSW referral received to assess pt'[name] history of anxiety/depression and MJ use. Pt acknowledges that she experiences, panic attacks often. She identified the sources of the anxious symptoms, as stress but did not elaborate. As a result of the anxious symptoms she experiences, she self medicates with MJ. She admits to smoking MJ, daily, prior to pregnancy confirmation at 6 weeks. Once pregnancy was confirmed, she  continued to smoke until end of 2nd month of pregnancy. Pt told CSW that the MJ helped her calm down, cope and gain an appetite. In addition, pt admits that the pregnancy was unplanned and she was in denial. Pt'[name] states she is committed to parenting this child, as she stated "I love her already." Pt received mental health treatment at the [facility] and plans to schedule an appointment upon discharge. She denies history of SI/HI. She reports feeling fine now & happy. She is accompanied by her best friend, Jessica, who was attending to the infant. Pt has not had any contact with FOB since her 3rd month of pregnancy. Pt appears to have a limited support system. CSW encouraged her to follow up with the [facility] and talked to her about the importance of not smoking around the infant. CSW explained hospital drug testing policy. UDS collection pending, as well as meconium results. Pt has all the necessary supplies for the infant. CSW will continue to monitor results and make a referral if needed.   VI SOCIAL WORK PLAN  Social Work Plan   No Further Intervention Required / No Barriers to Discharge   Type of pt/family education:  If child protective services report - county:  If child protective services report - date:  Information/referral to community resources comment:  Other social work plan:     

## 2012-01-20 NOTE — Anesthesia Postprocedure Evaluation (Signed)
  Anesthesia Post Note  Patient: Amanda [name]  Procedure(s) Performed: Procedure(s) (LRB): CESAREAN SECTION (N/A)  Anesthesia type: Epidural  Patient location: Mother/Baby  Post pain: Pain level controlled  Post assessment: Post-op Vital signs reviewed  Last Vitals:  Filed Vitals:   01/20/12 1222  BP: 118/81  Pulse: 88  Temp:   Resp: 20    Post vital signs: Reviewed  Level of consciousness:alert  Complications: No apparent anesthesia complications

## 2012-01-20 NOTE — Addendum Note (Signed)
Addendum  created 01/20/12 1353 by Lincoln Brigham, CRNA   Modules edited:Notes Section

## 2012-01-20 NOTE — Progress Notes (Signed)
UR chart review completed.  

## 2012-01-20 NOTE — Progress Notes (Signed)
Patient ID: Amanda [name], female   DOB: Jun 19, 1980, 31 y.o.   MRN: [id number] DOS vs normal Pt states she is doing well

## 2012-01-20 NOTE — Progress Notes (Signed)
Patient ID: Amanda [name], female   DOB: April 06, 1980, 31 y.o.   MRN: [id number] The pitocin was decreased slightly because of some decrease in variability and late decelerations. Per the RN exam there has been no cervical change.

## 2012-01-20 NOTE — Transfer of Care (Signed)
Immediate Anesthesia Transfer of Care Note  Patient: Amanda [name]  Procedure(s) Performed: Procedure(s) (LRB) with comments: CESAREAN SECTION (N/A)  Patient Location: PACU  Anesthesia Type:Epidural  Level of Consciousness: awake, alert  and oriented  Airway & Oxygen Therapy: Patient Spontanous Breathing  Post-op Assessment: Report given to PACU RN and Post -op Vital signs reviewed and stable  Post vital signs: Reviewed and stable  Complications: No apparent anesthesia complications

## 2012-01-20 NOTE — Progress Notes (Signed)
Patient ID: Amanda [name], female   DOB: [birth year], 31 y.o.   MRN: [id number] At around 2:12 AM the pt began having late decelerations that were significant. The RN tried to correct them with position change and was temporarily successful until they recurred. I was not notified untyil 2:50 AM when I was in another pt's room preparing for delivery. The pitocin had been d/ced and I came to the bedside and reviewed the strip. The FHR had recovered and I returned to the other room and delivered the baby. I came back to see Ms. Sheehy and reexamined her and the cervix was still a FT 50 % effaced and the vertex was at - 3 station.I advised the pt to have a Csection and she agreed. I called the OR and the RN stated they were preparing to begin a c section and I suggested she call a second team even though I did not consider my case an emergency. Since my pt had been attempting delivery for 31 and 1/ hours and she had proved to my satisfaction that she was not going to have a vaginal birth I did not want to wait another 2 hours  To deliver the pt.

## 2012-01-21 LAB — CBC
MCH: 26.8 pg (ref 26.0–34.0)
MCHC: 33.7 g/dL (ref 30.0–36.0)
MCV: 79.7 fL (ref 78.0–100.0)
Platelets: 162 10*3/uL (ref 150–400)
RBC: 3.8 MIL/uL — ABNORMAL LOW (ref 3.87–5.11)
RDW: 14.8 % (ref 11.5–15.5)

## 2012-01-21 NOTE — Discharge Summary (Signed)
Obstetric Discharge Summary Reason for Admission: induction of labor Prenatal Procedures: none Intrapartum Procedures: cesarean: low cervical, transverse Postpartum Procedures: none Complications-Operative and Postpartum: none Hemoglobin  Date Value Range Status  01/21/2012 10.2* 12.0 - 15.0 g/dL Final     HCT  Date Value Range Status  01/21/2012 30.3* 36.0 - 46.0 % Final    Physical Exam:  General: alert and cooperative Lochia: appropriate Uterine Fundus: firm Incision: healing well   Discharge Diagnoses: Term Pregnancy-delivered                                         Late decelerations                                         Arrest of dilation Discharge Information: Date: 01/22/2012 Activity: pelvic rest Diet: routine Medications: Ibuprofen and Percocet Condition: improved Instructions: refer to practice specific booklet Discharge to: home   Newborn Data: Live born female  Birth Weight: 6 lb 11.4 oz (3045 g) APGAR: 3, 9  Home with mother. Pt to f/u on 01/27/12 for staple removal.  Tameya Kuznia W 01/22/2012, 9:32 AM

## 2012-01-21 NOTE — Progress Notes (Signed)
Subjective: Postpartum Day 1 Cesarean Delivery Patient reports tolerating PO and no problems voiding.  Pain well-controlled  Objective: Vital signs in last 24 hours: Temp:  [98.3 F (36.8 C)-99.8 F (37.7 C)] 98.7 F (37.1 C) (12/28 0426) Pulse Rate:  [80-92] 88  (12/28 0426) Resp:  [18-20] 20  (12/28 0426) BP: (96-124)/(66-84) 102/66 mmHg (12/28 0426) SpO2:  [96 %-100 %] 100 % (12/28 0426)  Physical Exam:  General: alert and cooperative Lochia: appropriate Uterine Fundus: firm Incision: healing well/dressing C/D/I    Basename 01/21/12 0507 01/18/12 1900  HGB 10.2* 10.5*  HCT 30.3* 31.5*    Assessment/Plan: Status post Cesarean section. Doing well postoperatively.  Continue current care. Pt prob will want d/c tomorrow  Oliver Pila 01/21/2012, 10:06 AM

## 2012-01-22 MED ORDER — OXYCODONE-ACETAMINOPHEN 5-325 MG PO TABS: 1.0000 | ORAL_TABLET | ORAL | Status: DC | PRN

## 2012-01-22 MED ORDER — IBUPROFEN 600 MG PO TABS: 600.0000 mg | ORAL_TABLET | Freq: Four times a day (QID) | ORAL | Status: DC

## 2012-01-22 NOTE — Progress Notes (Signed)
Patient ID: Amanda [name], female   DOB: [birth year], 31 y.o.   MRN: [id number] POD #2 Pt doing very well.  Requests d/c. Tol po meds and ambulating well  Abdomen soft NT RN has just reapplied another honeycomb dressing and states incision looks great Staples intact  D/c Motrin and percocet for pain F/u in office 01/27/12 for staple removal

## 2012-01-23 ENCOUNTER — Encounter (HOSPITAL_COMMUNITY): Payer: Self-pay | Admitting: Obstetrics and Gynecology

## 2012-02-28 ENCOUNTER — Emergency Department (HOSPITAL_COMMUNITY)
Admission: EM | Admit: 2012-02-28 | Discharge: 2012-02-28 | Disposition: A | Discharge status: Home / Self Care | Payer: Self-pay | Source: Home / Self Care

## 2012-05-28 ENCOUNTER — Ambulatory Visit (INDEPENDENT_AMBULATORY_CARE_PROVIDER_SITE_OTHER): Payer: BC Managed Care – PPO | Admitting: Family Medicine

## 2012-05-28 VITALS — BP 136/84 | HR 87 | Temp 98.4°F | Resp 18 | Ht 63.0 in | Wt 249.0 lb

## 2012-05-28 DIAGNOSIS — H6122 Impacted cerumen, left ear: Secondary | ICD-10-CM

## 2012-05-28 DIAGNOSIS — F4323 Adjustment disorder with mixed anxiety and depressed mood: Secondary | ICD-10-CM

## 2012-05-28 DIAGNOSIS — H6590 Unspecified nonsuppurative otitis media, unspecified ear: Secondary | ICD-10-CM

## 2012-05-28 DIAGNOSIS — H6120 Impacted cerumen, unspecified ear: Secondary | ICD-10-CM

## 2012-05-28 DIAGNOSIS — R079 Chest pain, unspecified: Secondary | ICD-10-CM

## 2012-05-28 MED ORDER — CETIRIZINE HCL 10 MG PO TABS: 10.0000 mg | ORAL_TABLET | Freq: Every day | ORAL | Status: DC

## 2012-05-28 MED ORDER — ESCITALOPRAM OXALATE 20 MG PO TABS: 20.0000 mg | ORAL_TABLET | Freq: Every day | ORAL | Status: DC

## 2012-05-28 NOTE — Progress Notes (Signed)
32 yo woman with anxiety hx who presents with 64 month old baby and significant other. She is complaining of headache, left ear pain for several days  She had been on clonazepam and lexapro before pregnancy.  She does some volunteer.  Baby sleeps for 5 to 6 hours per night.  She is having chest pains similar to those before pregnancy which have been related to PTSD.  Objective:  Alert, obese, NAD HEENT:  Some cerumen buildup--> cleared after lavage revealing mild SOM Neck:  Supple Chest: clear Heart:  Regular, no murmur Ext: no calf tenderness or edema.  Assessment: anxiety, serous otitis, panic disorder.  Plan:   Lexapro

## 2012-06-20 ENCOUNTER — Ambulatory Visit (INDEPENDENT_AMBULATORY_CARE_PROVIDER_SITE_OTHER): Payer: BC Managed Care – PPO | Admitting: Internal Medicine

## 2012-06-20 ENCOUNTER — Encounter: Payer: Self-pay | Admitting: Internal Medicine

## 2012-06-20 ENCOUNTER — Other Ambulatory Visit (INDEPENDENT_AMBULATORY_CARE_PROVIDER_SITE_OTHER): Payer: BC Managed Care – PPO

## 2012-06-20 VITALS — BP 122/82 | HR 89 | Temp 98.8°F | Ht 62.0 in | Wt 248.0 lb

## 2012-06-20 DIAGNOSIS — Z130 Encounter for screening for diseases of the blood and blood-forming organs and certain disorders involving the immune mechanism: Secondary | ICD-10-CM

## 2012-06-20 DIAGNOSIS — Z13220 Encounter for screening for lipoid disorders: Secondary | ICD-10-CM

## 2012-06-20 DIAGNOSIS — Z131 Encounter for screening for diabetes mellitus: Secondary | ICD-10-CM

## 2012-06-20 DIAGNOSIS — R011 Cardiac murmur, unspecified: Secondary | ICD-10-CM

## 2012-06-20 DIAGNOSIS — Z0000 Encounter for general adult medical examination without abnormal findings: Secondary | ICD-10-CM

## 2012-06-20 DIAGNOSIS — Z1329 Encounter for screening for other suspected endocrine disorder: Secondary | ICD-10-CM

## 2012-06-20 DIAGNOSIS — F329 Major depressive disorder, single episode, unspecified: Secondary | ICD-10-CM

## 2012-06-20 DIAGNOSIS — J45909 Unspecified asthma, uncomplicated: Secondary | ICD-10-CM

## 2012-06-20 DIAGNOSIS — K219 Gastro-esophageal reflux disease without esophagitis: Secondary | ICD-10-CM

## 2012-06-20 DIAGNOSIS — R51 Headache: Secondary | ICD-10-CM

## 2012-06-20 DIAGNOSIS — E669 Obesity, unspecified: Secondary | ICD-10-CM

## 2012-06-20 DIAGNOSIS — F3289 Other specified depressive episodes: Secondary | ICD-10-CM

## 2012-06-20 LAB — BASIC METABOLIC PANEL
CO2: 29 mEq/L (ref 19–32)
Chloride: 106 mEq/L (ref 96–112)
Creatinine, Ser: 0.7 mg/dL (ref 0.4–1.2)
Potassium: 3.9 mEq/L (ref 3.5–5.1)
Sodium: 138 mEq/L (ref 135–145)

## 2012-06-20 LAB — LIPID PANEL
Cholesterol: 166 mg/dL (ref 0–200)
HDL: 46.5 mg/dL (ref >39.00)
LDL Cholesterol: 96 mg/dL (ref 0–99)
Total CHOL/HDL Ratio: 4
Triglycerides: 118 mg/dL (ref 0.0–149.0)
VLDL: 23.6 mg/dL (ref 0.0–40.0)

## 2012-06-20 LAB — CBC
MCHC: 33 g/dL (ref 30.0–36.0)
RDW: 16.6 % — ABNORMAL HIGH (ref 11.5–14.6)
WBC: 8.9 10*3/uL (ref 4.5–10.5)

## 2012-06-20 MED ORDER — ALBUTEROL SULFATE HFA 108 (90 BASE) MCG/ACT IN AERS: 2.0000 | INHALATION_SPRAY | Freq: Four times a day (QID) | RESPIRATORY_TRACT | Status: DC | PRN

## 2012-06-20 MED ORDER — OMEPRAZOLE 20 MG PO CPDR: 20.0000 mg | DELAYED_RELEASE_CAPSULE | Freq: Every day | ORAL | Status: DC

## 2012-06-20 NOTE — Progress Notes (Signed)
HPI  Pt presents to the clinic today to establish care. [facility] closed. She does have a few concerns today. 1- she is having recurrent tension headaches. Tylenol and ibuprofen do not help with the pain. She does take her friends prescription pain medicine which does help and would like an RX today if possible. The pain is usually on her bilateral forehead or in the back of her neck. She usually tries to sleep it off. She has never found the true cause of her headaches. She also has a history of asthma and GERD. She needs refills of her prilosec and proventil.  Fku: 2012 Tetanus: 2006 Eye doctor: as needed Dentist: yearly LMP: 06/17/12  Past Medical History  Diagnosis Date  . Asthma   . GERD (gastroesophageal reflux disease)   . Headache(784.0)   . Anxiety   . Environmental allergies   . Acid reflux   . H/O hiatal hernia   . Depression   . Heart murmur   . Allergy   . Genital warts     Current Outpatient Prescriptions  Medication Sig Dispense Refill  . cetirizine (ZYRTEC) 10 MG tablet Take 1 tablet (10 mg total) by mouth daily.  30 tablet  11  . escitalopram (LEXAPRO) 20 MG tablet Take 1 tablet (20 mg total) by mouth daily.  30 tablet  5  . calcium carbonate (TUMS - DOSED IN MG ELEMENTAL CALCIUM) 500 MG chewable tablet Chew 1 tablet by mouth daily as needed. For heartburn      . ibuprofen (ADVIL,MOTRIN) 600 MG tablet Take 1 tablet (600 mg total) by mouth every 6 (six) hours.  30 tablet  1   No current facility-administered medications for this visit.    Allergies  Allergen Reactions  . Doxycycline Hyclate Hives, Itching and Swelling    REACTION: swelling of face  . Ketorolac Tromethamine Other (See Comments)    Patient stated that it makes her extremely hyper  . Amoxicillin Swelling and Rash    REACTION: facial swelling and rash.    Family History  Problem Relation Age of Onset  . Diabetes Mother   . Hypertension Mother   .  Depression Mother   . Depression Father   . Anesthesia problems Neg Hx   . Breast cancer Maternal Aunt     [employer]  . Heart disease Maternal Grandmother   . Diabetes Maternal Grandfather   . Heart disease Maternal Grandfather     History   Social History  . Marital Status: Single    Spouse Name: N/A    Number of Children: 1  . Years of Education: 12+   Occupational History  . Not on file.   Social History Main Topics  . Smoking status: Never Smoker   . Smokeless tobacco: Never Used  . Alcohol Use: No  . Drug Use: No  . Sexually Active: Yes    Birth Control/ Protection: None   Other Topics Concern  . Not on file   Social History Narrative   Regular exercise-no   Caffeine Use-yes    ROS:  Constitutional: Pt reports headaches. Denies fever, malaise, fatigue, or abrupt weight changes.  HEENT: Denies eye pain, eye redness, ear pain, ringing in the ears, wax buildup, runny nose, nasal congestion, bloody nose, or sore throat. Respiratory: Denies difficulty breathing, shortness of breath, cough or sputum production.   Cardiovascular: Pt reports heart murmur. Denies chest pain, chest tightness, palpitations or swelling in the hands  or feet.  Gastrointestinal: Denies abdominal pain, bloating, constipation, diarrhea or blood in the stool.  GU: Denies frequency, urgency, pain with urination, blood in urine, odor or discharge. Musculoskeletal: Denies decrease in range of motion, difficulty with gait, muscle pain or joint pain and swelling.  Skin: Denies redness, rashes, lesions or ulcercations.  Neurological: Denies dizziness, difficulty with memory, difficulty with speech or problems with balance and coordination.   No other specific complaints in a complete review of systems (except as listed in HPI above).  PE:  BP 122/82  Pulse 89  Temp(Src) 98.8 F (37.1 C) (Oral)  Ht 5\' 2"  (1.575 m)  Wt 248 lb (112.492 kg)  BMI 45.35 kg/m2  SpO2 98%  LMP 06/17/2012 Wt  Readings from Last 3 Encounters:  06/20/12 248 lb (112.492 kg)  05/28/12 249 lb (112.946 kg)  01/18/12 261 lb (118.389 kg)    General: Appears her stated age, obese but well developed, well nourished in NAD. HEENT: Head: normal shape and size; Eyes: sclera white, no icterus, conjunctiva pink, PERRLA and EOMs intact; Ears: Tm's gray and intact, normal light reflex; Nose: mucosa pink and moist, septum midline; Throat/Mouth: Teeth present, mucosa pink and moist, no lesions or ulcerations noted.  Neck: Normal range of motion. Neck supple, trachea midline. No massses, lumps or thyromegaly present.  Cardiovascular: Normal rate and rhythm. S1,S2 noted.  Murmur noted. No rubs or gallops noted. No JVD or BLE edema. No carotid bruits noted. Pulmonary/Chest: Normal effort and positive vesicular breath sounds. No respiratory distress. No wheezes, rales or ronchi noted.  Abdomen: Soft and nontender. Normal bowel sounds, no bruits noted. No distention or masses noted. Liver, spleen and kidneys non palpable. Musculoskeletal: Normal range of motion. No signs of joint swelling. No difficulty with gait.  Neurological: Alert and oriented. Cranial nerves II-XII intact. Coordination normal. +DTRs bilaterally. Psychiatric: Mood and affect normal. Behavior is normal. Judgment and thought content normal.      Assessment and Plan:  Preventative Health Maintenance:  Encouraged pt to work on diet and exercise Labs obtained today

## 2012-06-20 NOTE — Assessment & Plan Note (Signed)
Encouraged pt to work on diet and exercise

## 2012-06-20 NOTE — Assessment & Plan Note (Signed)
Will obtain echo ?

## 2012-06-20 NOTE — Assessment & Plan Note (Signed)
Avoid food that make reflux worse Prilosec refilled today

## 2012-06-20 NOTE — Assessment & Plan Note (Signed)
Refer to the headache clinic Please dont take your friends pain medicine

## 2012-06-20 NOTE — Patient Instructions (Signed)

## 2012-06-20 NOTE — Assessment & Plan Note (Signed)
Refilled proventil today

## 2012-06-20 NOTE — Assessment & Plan Note (Signed)
Continue to follow with [facility]

## 2012-07-02 ENCOUNTER — Ambulatory Visit (HOSPITAL_COMMUNITY): Payer: BC Managed Care – PPO | Attending: Internal Medicine | Admitting: Radiology

## 2012-07-02 DIAGNOSIS — R011 Cardiac murmur, unspecified: Secondary | ICD-10-CM | POA: Insufficient documentation

## 2012-07-02 DIAGNOSIS — E669 Obesity, unspecified: Secondary | ICD-10-CM | POA: Insufficient documentation

## 2012-07-02 NOTE — Progress Notes (Signed)
Echocardiogram performed.  

## 2012-07-12 ENCOUNTER — Ambulatory Visit (INDEPENDENT_AMBULATORY_CARE_PROVIDER_SITE_OTHER): Payer: BC Managed Care – PPO | Admitting: Family Medicine

## 2012-07-12 ENCOUNTER — Ambulatory Visit: Payer: BC Managed Care – PPO

## 2012-07-12 VITALS — BP 148/96 | HR 86 | Temp 100.0°F | Resp 18 | Ht 63.0 in | Wt 240.0 lb

## 2012-07-12 DIAGNOSIS — J209 Acute bronchitis, unspecified: Secondary | ICD-10-CM

## 2012-07-12 DIAGNOSIS — H6690 Otitis media, unspecified, unspecified ear: Secondary | ICD-10-CM

## 2012-07-12 DIAGNOSIS — H9209 Otalgia, unspecified ear: Secondary | ICD-10-CM

## 2012-07-12 DIAGNOSIS — R059 Cough, unspecified: Secondary | ICD-10-CM

## 2012-07-12 DIAGNOSIS — H6692 Otitis media, unspecified, left ear: Secondary | ICD-10-CM

## 2012-07-12 DIAGNOSIS — R0602 Shortness of breath: Secondary | ICD-10-CM

## 2012-07-12 DIAGNOSIS — R51 Headache: Secondary | ICD-10-CM

## 2012-07-12 DIAGNOSIS — R05 Cough: Secondary | ICD-10-CM

## 2012-07-12 DIAGNOSIS — H9202 Otalgia, left ear: Secondary | ICD-10-CM

## 2012-07-12 LAB — POCT CBC
Granulocyte percent: 75.1 % (ref 37–80)
HCT, POC: 38.7 % (ref 37.7–47.9)
Hemoglobin: 12.2 g/dL (ref 12.2–16.2)
Lymph, poc: 2 (ref 0.6–3.4)
MCH, POC: 26.9 pg — AB (ref 27–31.2)
MCHC: 31.5 g/dL — AB (ref 31.8–35.4)
MCV: 85.2 fL (ref 80–97)
MID (cbc): 0.7 (ref 0–0.9)
MPV: 9.4 fL (ref 0–99.8)
POC Granulocyte: 8 — AB (ref 2–6.9)
POC LYMPH PERCENT: 18.7 % (ref 10–50)
POC MID %: 6.2 %M (ref 0–12)
Platelet Count, POC: 189 10*3/uL (ref 142–424)
RBC: 4.54 M/uL (ref 4.04–5.48)
RDW, POC: 16.9 %
WBC: 10.7 10*3/uL — AB (ref 4.6–10.2)

## 2012-07-12 MED ORDER — AZITHROMYCIN 250 MG PO TABS: ORAL_TABLET | ORAL | Status: DC

## 2012-07-12 MED ORDER — BENZONATATE 100 MG PO CAPS: 200.0000 mg | ORAL_CAPSULE | Freq: Two times a day (BID) | ORAL | Status: DC | PRN

## 2012-07-12 MED ORDER — ALBUTEROL SULFATE (2.5 MG/3ML) 0.083% IN NEBU
2.5000 mg | INHALATION_SOLUTION | Freq: Once | RESPIRATORY_TRACT | Status: AC
  Administered 2012-07-12: 2.5 mg via RESPIRATORY_TRACT

## 2012-07-12 MED ORDER — HYDROCODONE-HOMATROPINE 5-1.5 MG/5ML PO SYRP: 5.0000 mL | ORAL_SOLUTION | Freq: Every evening | ORAL | Status: DC | PRN

## 2012-07-12 MED ORDER — IPRATROPIUM BROMIDE 0.02 % IN SOLN
0.5000 mg | Freq: Once | RESPIRATORY_TRACT | Status: AC
  Administered 2012-07-12: 0.5 mg via RESPIRATORY_TRACT

## 2012-07-12 MED ORDER — ALBUTEROL SULFATE HFA 108 (90 BASE) MCG/ACT IN AERS: 2.0000 | INHALATION_SPRAY | Freq: Four times a day (QID) | RESPIRATORY_TRACT | Status: DC | PRN

## 2012-07-12 NOTE — Progress Notes (Signed)
[facility]:  Office Visit  Chief Complaint:  Chief Complaint  Patient presents with  . Shortness of Breath    x 8am but worsening through out day  . Cough    HPI: Amanda [name] is a 32 y.o. female who complains of  Coughing since this AM and has gotten worse, was using her inhaler that her daughter had with some relief, has been wheezing,has been SOB, no fevers or chills. She has had left ear pain, was in here in mid May and was given Lexapro and anithistamine. She says her ear has gotten worse. She has a h/o asthma but infreq uses her inhaler, has a h/o allergies  Past Medical History  Diagnosis Date  . Asthma   . GERD (gastroesophageal reflux disease)   . Headache(784.0)   . Anxiety   . Environmental allergies   . Acid reflux   . H/O hiatal hernia   . Depression   . Heart murmur   . Allergy   . Genital warts    Past Surgical History  Procedure Laterality Date  . Cervical cone biopsy    . Marsupialization urethral diverticulum    . Cesarean section  01/20/2012    Procedure: CESAREAN SECTION;  Surgeon: [name], MD;  Location: [facility];  Service: Obstetrics;  Laterality: N/A;  . Upper gi endoscopy     History   Social History  . Marital Status: Single    Spouse Name: N/A    Number of Children: 1  . Years of Education: 12+   Social History Main Topics  . Smoking status: Never Smoker   . Smokeless tobacco: Never Used  . Alcohol Use: No  . Drug Use: No  . Sexually Active: Yes    Birth Control/ Protection: None   Other Topics Concern  . None   Social History Narrative   Regular exercise-no   Caffeine Use-yes   Family History  Problem Relation Age of Onset  . Diabetes Mother   . Hypertension Mother   . Depression Mother   . Depression Father   . Anesthesia problems Neg Hx   . Breast cancer Maternal Aunt     [employer]  . Heart disease Maternal Grandmother   . Diabetes Maternal Grandfather   . Heart disease Maternal  Grandfather    Allergies  Allergen Reactions  . Doxycycline Hyclate Hives, Itching and Swelling    REACTION: swelling of face  . Ketorolac Tromethamine Other (See Comments)    Patient stated that it makes her extremely hyper  . Amoxicillin Swelling and Rash    REACTION: facial swelling and rash.   Prior to Admission medications   Medication Sig Start Date End Date Taking? Authorizing Provider  cetirizine (ZYRTEC) 10 MG tablet Take 1 tablet (10 mg total) by mouth daily. 05/28/12  Yes Elvina Sidle, MD  escitalopram (LEXAPRO) 20 MG tablet Take 1 tablet (20 mg total) by mouth daily. 05/28/12  Yes Elvina Sidle, MD  albuterol (PROVENTIL HFA;VENTOLIN HFA) 108 (90 BASE) MCG/ACT inhaler Inhale 2 puffs into the lungs every 6 (six) hours as needed for wheezing. 06/20/12   Nicki Reaper, NP  calcium carbonate (TUMS - DOSED IN MG ELEMENTAL CALCIUM) 500 MG chewable tablet Chew 1 tablet by mouth daily as needed. For heartburn    Historical Provider, MD  ibuprofen (ADVIL,MOTRIN) 600 MG tablet Take 1 tablet (600 mg total) by mouth every 6 (six) hours. 01/22/12   Oliver Pila, MD  omeprazole (PRILOSEC)  20 MG capsule Take 1 capsule (20 mg total) by mouth daily. 06/20/12   Nicki Reaper, NP     ROS: The patient denies fevers, chills, night sweats, unintentional weight loss, chest pain, palpitations,  nausea, vomiting, abdominal pain, dysuria, hematuria, melena, numbness, weakness, or tingling.   All other systems have been reviewed and were otherwise negative with the exception of those mentioned in the HPI and as above.    PHYSICAL EXAM: Filed Vitals:   07/12/12 2037  BP: 148/96  Pulse: 86  Temp: 100 F (37.8 C)  Resp: 18  Spo2  100% Filed Vitals:   07/12/12 2037  Height: 5\' 3"  (1.6 m)  Weight: 240 lb (108.863 kg)   Body mass index is 42.52 kg/(m^2).  General: Alert, mild  Distress, able to speak in full sentences HEENT:  Normocephalic, atraumatic, oropharynx patent. ++ Left ear  TM  erythematous, bulging, no effusion Cardiovascular:  Regular rate and rhythm, no rubs murmurs or gallops.  No Carotid bruits, radial pulse intact. No pedal edema.  Respiratory: Clear to auscultation bilaterally.  No wheezes, rales, or rhonchi.  No cyanosis, no use of accessory musculature GI: No organomegaly, abdomen is soft and non-tender, positive bowel sounds.  No masses. Skin: No rashes. Neurologic: Facial musculature symmetric. Psychiatric: Patient is appropriate throughout our interaction. Lymphatic: No cervical lymphadenopathy Musculoskeletal: Gait intact.   LABS: Results for orders placed in visit on 07/12/12  POCT CBC      Result Value Range   WBC 10.7 (*) 4.6 - 10.2 K/uL   Lymph, poc 2.0  0.6 - 3.4   POC LYMPH PERCENT 18.7  10 - 50 %L   MID (cbc) 0.7  0 - 0.9   POC MID % 6.2  0 - 12 %M   POC Granulocyte 8.0 (*) 2 - 6.9   Granulocyte percent 75.1  37 - 80 %G   RBC 4.54  4.04 - 5.48 M/uL   Hemoglobin 12.2  12.2 - 16.2 g/dL   HCT, POC 28.4  13.2 - 47.9 %   MCV 85.2  80 - 97 fL   MCH, POC 26.9 (*) 27 - 31.2 pg   MCHC 31.5 (*) 31.8 - 35.4 g/dL   RDW, POC 44.0     Platelet Count, POC 189  142 - 424 K/uL   MPV 9.4  0 - 99.8 fL     EKG/XRAY:   Primary read interpreted by Dr. Conley Rolls at [facility] ? Right infiltrate with increase vascular markings vs bronchitic changes and increase vascular markings No effusion   ASSESSMENT/PLAN: Encounter Diagnoses  Name Primary?  . SOB (shortness of breath) Yes  . Headache(784.0)   . Ear pain, left   . Left otitis media   . Acute bronchitis   . Cough    [name] is a 32 y/o ofemale who recently had a baby, who is approximately 7 months old. She came into the Urgent Care and attempted to check in after we already closed, she was brought in and seen adn was very tearful. After she was told that she could not be seen since we already closed, she told the staff that she was SOB and also has a h/o asthma. She is here with her 2 siblings  who appear to be under the age of 29 and also her own child.   Better air flow after neb treatment Rx azithromycin sicne has PCN allergy Rx Tessalon Perles Rx Hydromet syrup ( she has her mom who can watch the  baby if she takes this medication) Rx albuterol inh  Gross sideeffects, risk and benefits, and alternatives of medications d/w patient. Patient is aware that all medications have potential sideeffects and we are unable to predict every sideeffect or drug-drug interaction that may occur. F/u prn or in 48 hrs  Go to Er prn    Kerriann Kamphuis PHUONG, DO 07/12/2012 9:25 PM

## 2012-10-29 ENCOUNTER — Ambulatory Visit (INDEPENDENT_AMBULATORY_CARE_PROVIDER_SITE_OTHER): Payer: BC Managed Care – PPO | Admitting: Internal Medicine

## 2012-10-29 ENCOUNTER — Encounter: Payer: Self-pay | Admitting: Internal Medicine

## 2012-10-29 VITALS — BP 120/82 | HR 85 | Wt 261.0 lb

## 2012-10-29 DIAGNOSIS — B351 Tinea unguium: Secondary | ICD-10-CM

## 2012-10-29 DIAGNOSIS — Z23 Encounter for immunization: Secondary | ICD-10-CM

## 2012-10-29 NOTE — Progress Notes (Signed)
Subjective:    Patient ID: Amanda [name], female    DOB: December 28, 1980, 32 y.o.   MRN: [id number]  HPI  Pt presents to the clinic today with c/o toenail problem. She reports that the toenail on her right foot is coming off. This has been an ongoing issue. Per her insurance she will need a referral to foot specialist. She has tried epsom salt soaks, OTC fungal liquid and it continues to get worse.   Review of Systems      Past Medical History  Diagnosis Date  . Asthma   . GERD (gastroesophageal reflux disease)   . Headache(784.0)   . Anxiety   . Environmental allergies   . Acid reflux   . H/O hiatal hernia   . Depression   . Heart murmur   . Allergy   . Genital warts     Current Outpatient Prescriptions  Medication Sig Dispense Refill  . albuterol (PROVENTIL HFA;VENTOLIN HFA) 108 (90 BASE) MCG/ACT inhaler Inhale 2 puffs into the lungs every 6 (six) hours as needed for wheezing.  1 Inhaler  3  . azithromycin (ZITHROMAX) 250 MG tablet Take 2 tabs po now, then 1 tab po daily for 4 more days  6 tablet  0  . benzonatate (TESSALON) 100 MG capsule Take 2 capsules (200 mg total) by mouth 2 (two) times daily as needed for cough.  30 capsule  1  . calcium carbonate (TUMS - DOSED IN MG ELEMENTAL CALCIUM) 500 MG chewable tablet Chew 1 tablet by mouth daily as needed. For heartburn      . cetirizine (ZYRTEC) 10 MG tablet Take 1 tablet (10 mg total) by mouth daily.  30 tablet  11  . escitalopram (LEXAPRO) 20 MG tablet Take 1 tablet (20 mg total) by mouth daily.  30 tablet  5  . HYDROcodone-homatropine (HYCODAN) 5-1.5 MG/5ML syrup Take 5 mLs by mouth at bedtime as needed for cough.  120 mL  0  . ibuprofen (ADVIL,MOTRIN) 600 MG tablet Take 1 tablet (600 mg total) by mouth every 6 (six) hours.  30 tablet  1  . omeprazole (PRILOSEC) 20 MG capsule Take 1 capsule (20 mg total) by mouth daily.  30 capsule  3   No current facility-administered medications for this visit.    Allergies   Allergen Reactions  . Doxycycline Hyclate Hives, Itching and Swelling    REACTION: swelling of face  . Ketorolac Tromethamine Other (See Comments)    Patient stated that it makes her extremely hyper  . Amoxicillin Swelling and Rash    REACTION: facial swelling and rash.    Family History  Problem Relation Age of Onset  . Diabetes Mother   . Hypertension Mother   . Depression Mother   . Depression Father   . Anesthesia problems Neg Hx   . Breast cancer Maternal Aunt     [employer]  . Heart disease Maternal Grandmother   . Diabetes Maternal Grandfather   . Heart disease Maternal Grandfather     History   Social History  . Marital Status: Single    Spouse Name: N/A    Number of Children: 1  . Years of Education: 12+   Occupational History  . Not on file.   Social History Main Topics  . Smoking status: Never Smoker   . Smokeless tobacco: Never Used  . Alcohol Use: No  . Drug Use: No  . Sexual Activity: Yes    Birth Control/ Protection: None   Other  Topics Concern  . Not on file   Social History Narrative   Regular exercise-no   Caffeine Use-yes     Constitutional: Denies fever, malaise, fatigue, headache or abrupt weight changes.  Skin: Pt reports toenail fungus. Denies redness, rashes, lesions or ulcercations.    No other specific complaints in a complete review of systems (except as listed in HPI above).  Objective:   Physical Exam   BP 120/82  Pulse 85  Wt 261 lb (118.389 kg)  BMI 46.25 kg/m2  SpO2 98% Wt Readings from Last 3 Encounters:  10/29/12 261 lb (118.389 kg)  07/12/12 240 lb (108.863 kg)  06/20/12 248 lb (112.492 kg)    General: Appears her stated age, obese but well developed, well nourished in NAD. Skin: Warm, dry and intact. No rashes, lesions or ulcerations noted. Bilateral Tinea ungum noted Cardiovascular: Normal rate and rhythm. S1,S2 noted.  No murmur, rubs or gallops noted. No JVD or BLE edema. No carotid bruits  noted. Pulmonary/Chest: Normal effort and positive vesicular breath sounds. No respiratory distress. No wheezes, rales or ronchi noted.    BMET    Component Value Date/Time   NA 138 06/20/2012 1526   K 3.9 06/20/2012 1526   CL 106 06/20/2012 1526   CO2 29 06/20/2012 1526   GLUCOSE 78 06/20/2012 1526   BUN 11 06/20/2012 1526   CREATININE 0.7 06/20/2012 1526   CALCIUM 9.1 06/20/2012 1526   GFRNONAA >90 01/18/2012 1900   GFRAA >90 01/18/2012 1900    Lipid Panel     Component Value Date/Time   CHOL 166 06/20/2012 1526   TRIG 118.0 06/20/2012 1526   HDL 46.50 06/20/2012 1526   CHOLHDL 4 06/20/2012 1526   VLDL 23.6 06/20/2012 1526   LDLCALC 96 06/20/2012 1526    CBC    Component Value Date/Time   WBC 10.7* 07/12/2012 2101   WBC 8.9 06/20/2012 1526   RBC 4.54 07/12/2012 2101   RBC 4.71 06/20/2012 1526   HGB 12.2 07/12/2012 2101   HGB 12.5 06/20/2012 1526   HCT 38.7 07/12/2012 2101   HCT 37.9 06/20/2012 1526   PLT 193.0 06/20/2012 1526   MCV 85.2 07/12/2012 2101   MCV 80.5 06/20/2012 1526   MCH 26.9* 07/12/2012 2101   MCH 26.8 01/21/2012 0507   MCHC 31.5* 07/12/2012 2101   MCHC 33.0 06/20/2012 1526   RDW 16.6* 06/20/2012 1526   LYMPHSABS 1.9 05/13/2011 1955   MONOABS 0.9 05/13/2011 1955   EOSABS 0.2 05/13/2011 1955   BASOSABS 0.0 05/13/2011 1955    Hgb A1C Lab Results  Component Value Date   HGBA1C 5.1 06/20/2012        Assessment & Plan:   Tinea ungum, bilateral:  Referral placed for podiatrist  RTC as needed or if it gets worse

## 2012-10-29 NOTE — Patient Instructions (Signed)
Ringworm, Nail A fungal infection of the nail (tinea unguium/onychomycosis) is common. It is common as the visible part of the nail is composed of dead cells which have no blood supply to help prevent infection. It occurs because fungi are everywhere and will pick any opportunity to grow on any dead material. Because nails are very slow growing they require up to 2 years of treatment with anti-fungal medications. The entire nail back to the base is infected. This includes approximately  of the nail which you cannot see. If your caregiver has prescribed a medication by mouth, take it every day and as directed. No progress will be seen for at least 6 to 9 months. Do not be disappointed! Because fungi live on dead cells with little or no exposure to blood supply, medication delivery to the infection is slow; thus the cure is slow. It is also why you can observe no progress in the first 6 months. The nail becoming cured is the base of the nail, as it has the blood supply. Topical medication such as creams and ointments are usually not effective. Important in successful treatment of nail fungus is closely following the medication regimen that your doctor prescribes. Sometimes you and your caregiver may elect to speed up this process by surgical removal of all the nails. Even this may still require 6 to 9 months of additional oral medications. See your caregiver as directed. Remember there will be no visible improvement for at least 6 months. See your caregiver sooner if other signs of infection (redness and swelling) develop. Document Released: 01/08/2000 Document Revised: 04/04/2011 Document Reviewed: 03/18/2008 [employer] Patient Information 2014 [employer].  

## 2012-10-30 ENCOUNTER — Telehealth: Payer: Self-pay | Admitting: Internal Medicine

## 2012-10-30 NOTE — Telephone Encounter (Signed)
noted 

## 2012-10-30 NOTE — Telephone Encounter (Signed)
Pt has [place] access referral will need to come from office/doctor listed on card. °

## 2012-11-21 ENCOUNTER — Encounter (HOSPITAL_COMMUNITY): Payer: Self-pay | Admitting: Emergency Medicine

## 2012-11-21 ENCOUNTER — Emergency Department (HOSPITAL_COMMUNITY)
Admission: EM | Admit: 2012-11-21 | Discharge: 2012-11-21 | Disposition: A | Discharge status: Home / Self Care | Payer: BC Managed Care – PPO | Attending: Emergency Medicine | Admitting: Emergency Medicine

## 2012-11-21 DIAGNOSIS — J45909 Unspecified asthma, uncomplicated: Secondary | ICD-10-CM | POA: Insufficient documentation

## 2012-11-21 DIAGNOSIS — K5900 Constipation, unspecified: Secondary | ICD-10-CM

## 2012-11-21 DIAGNOSIS — R011 Cardiac murmur, unspecified: Secondary | ICD-10-CM | POA: Insufficient documentation

## 2012-11-21 DIAGNOSIS — M545 Low back pain, unspecified: Secondary | ICD-10-CM | POA: Insufficient documentation

## 2012-11-21 DIAGNOSIS — Z3202 Encounter for pregnancy test, result negative: Secondary | ICD-10-CM | POA: Insufficient documentation

## 2012-11-21 DIAGNOSIS — R109 Unspecified abdominal pain: Secondary | ICD-10-CM | POA: Insufficient documentation

## 2012-11-21 DIAGNOSIS — Z9109 Other allergy status, other than to drugs and biological substances: Secondary | ICD-10-CM | POA: Insufficient documentation

## 2012-11-21 DIAGNOSIS — Z8619 Personal history of other infectious and parasitic diseases: Secondary | ICD-10-CM | POA: Insufficient documentation

## 2012-11-21 DIAGNOSIS — Z79899 Other long term (current) drug therapy: Secondary | ICD-10-CM | POA: Insufficient documentation

## 2012-11-21 DIAGNOSIS — Z8659 Personal history of other mental and behavioral disorders: Secondary | ICD-10-CM | POA: Insufficient documentation

## 2012-11-21 DIAGNOSIS — Z880 Allergy status to penicillin: Secondary | ICD-10-CM | POA: Insufficient documentation

## 2012-11-21 LAB — URINE MICROSCOPIC-ADD ON

## 2012-11-21 LAB — URINALYSIS, ROUTINE W REFLEX MICROSCOPIC
Bilirubin Urine: NEGATIVE
Glucose, UA: NEGATIVE mg/dL
Ketones, ur: NEGATIVE mg/dL
Nitrite: NEGATIVE
Specific Gravity, Urine: 1.018 (ref 1.005–1.030)
pH: 6 (ref 5.0–8.0)

## 2012-11-21 MED ORDER — DOCUSATE SODIUM 100 MG PO CAPS: 100.0000 mg | ORAL_CAPSULE | Freq: Two times a day (BID) | ORAL | Status: DC | PRN

## 2012-11-21 MED ORDER — METHOCARBAMOL 500 MG PO TABS: 500.0000 mg | ORAL_TABLET | Freq: Four times a day (QID) | ORAL | Status: DC | PRN

## 2012-11-21 NOTE — ED Notes (Signed)
Pt states started having lower back pain x 1 month ago, went away and now it's back, states this morning started having severe lower back pain again radiating down legs, states fell off porch this morning d/t pain.

## 2012-11-21 NOTE — ED Provider Notes (Signed)
Medical screening examination/treatment/procedure(s) were performed by non-physician practitioner and as supervising physician I was immediately available for consultation/collaboration.  EKG Interpretation   None         Tahnee Cifuentes H Romone Shaff, MD 11/21/12 2305 

## 2012-11-21 NOTE — ED Provider Notes (Signed)
CSN: [id number]     Arrival date & time 11/21/12  1653 History  This chart was scribed for non-physician practitioner Trixie Dredge, PA-C working with Richardean Canal, MD by Caryn Bee, ED Scribe. This patient was seen in room WTR5/WTR5 and the patient's care was started at 7:23 PM.    Chief Complaint  Patient presents with  . Back Pain    HPI HPI Comments: Amanda [name] is a 32 y.o. female who presents to the Emergency Department complaining of gradual onset lower back pain that began 3 days ago. Pt states that the pain is sharp and feels like labor pains. She previously had back pain in the beginning of October that lasted about 5 days and resolved. Her current episode began about 3 days ago and has been constant since onset. Pt denies any trauma or injury to the her back. She reports that the pain radiates down her bilateral posterior thighs. Today the pain radiated down her legs and caused her to fall down about 5 steps while she was carrying her 45 month old daughter. The pain is exacerbated by movement and bending. She states that she also has back pain when she urinates and defecates. Last BM was last night and was hard. Pt was taking prescription anti-inflammatory medication that was prescribed during her last visit with no relief. Pt denies weakness, numbness, tingling, fever, chills, body aches, bowel or urinary incontinence, vaginal discharge, vaginal pain, bloody stools, urinary symptoms. Pt has h/o hernia. She is currently on her menstrual cycle, it is on time and normal.  Denies head injury or LOC with fall.    Past Medical History  Diagnosis Date  . Asthma   . GERD (gastroesophageal reflux disease)   . Headache(784.0)   . Anxiety   . Environmental allergies   . Acid reflux   . H/O hiatal hernia   . Depression   . Heart murmur   . Allergy   . Genital warts    Past Surgical History  Procedure Laterality Date  . Cervical cone biopsy    . Marsupialization urethral  diverticulum    . Cesarean section  01/20/2012    Procedure: CESAREAN SECTION;  Surgeon: [name], MD;  Location: [facility];  Service: Obstetrics;  Laterality: N/A;  . Upper gi endoscopy     Family History  Problem Relation Age of Onset  . Diabetes Mother   . Hypertension Mother   . Depression Mother   . Depression Father   . Anesthesia problems Neg Hx   . Breast cancer Maternal Aunt     [employer]  . Heart disease Maternal Grandmother   . Diabetes Maternal Grandfather   . Heart disease Maternal Grandfather    History  Substance Use Topics  . Smoking status: Never Smoker   . Smokeless tobacco: Never Used  . Alcohol Use: No   OB History   Grav Para Term Preterm Abortions TAB SAB Ect Mult Living   2 1 1  0 1 0 1 0 0 1     Review of Systems  Constitutional: Negative for fever and chills.  Gastrointestinal: Positive for abdominal pain and constipation. Negative for blood in stool.  Genitourinary: Negative for dysuria, urgency, frequency, hematuria, vaginal bleeding, vaginal discharge, difficulty urinating, vaginal pain, menstrual problem and dyspareunia.  Musculoskeletal: Positive for back pain.  Neurological: Negative for weakness and numbness.    Allergies  Doxycycline hyclate; Ketorolac tromethamine; and Amoxicillin  Home Medications   Current Outpatient Rx  Name  Route  Sig  Dispense  Refill  . albuterol (PROVENTIL HFA;VENTOLIN HFA) 108 (90 BASE) MCG/ACT inhaler   Inhalation   Inhale 2 puffs into the lungs every 6 (six) hours as needed for wheezing.   1 Inhaler   3   . terbinafine (LAMISIL) 250 MG tablet   Oral   Take 250 mg by mouth daily.          Triage Vitals: BP 116/68  Pulse 78  Temp(Src) 98.6 F (37 C) (Oral)  Resp 16  SpO2 99%  LMP 11/21/2012  Physical Exam  Nursing note and vitals reviewed. Constitutional: She appears well-developed and well-nourished. No distress.  HENT:  Head: Normocephalic and atraumatic.  Neck: Neck supple.   Cardiovascular: Normal rate, regular rhythm and normal heart sounds.   Pulmonary/Chest: Effort normal and breath sounds normal. No respiratory distress. She has no wheezes. She has no rales.  Abdominal: Soft. She exhibits no distension and no mass. There is no tenderness. There is no rebound and no guarding.  obese  Musculoskeletal:  Spine nontender, no crepitus, or stepoffs.  Pt reports improvement of pain with palpation of lower back.    Neurological: She is alert.  Skin: She is not diaphoretic.    ED Course  Procedures (including critical care time) DIAGNOSTIC STUDIES: Oxygen Saturation is 99% on room air, normal by my interpretation.    COORDINATION OF CARE: 7:37 PM-Discussed treatment plan which includes UA with pt at bedside and pt agreed to plan.   Labs Review Labs Reviewed  URINALYSIS, ROUTINE W REFLEX MICROSCOPIC  POCT PREGNANCY, URINE   Imaging Review No results found.  EKG Interpretation   None       MDM   1. Low back pain   2. Constipation     Pt with second episode of low back pain this month without injury.  Pain occasionally shoots down legs, today causing her to fall (from pain, not from weakness).  Neurologically intact.  No red flags for back pain.  Abdomen nontender.  Pt also with mild constipation.  No e/o SBO.  UA, Urine preg ordered as patient states her last pregnancy was a surprise and she had menstrual periods during early pregnancy, also has pain in back with urination.  This is likely muscular, as the worst pain is with movement.   8:22 PM Discussed pt with Junius Finner PA-C at change of shift, she assumes care of patient pending UA results.   I personally performed the services described in this documentation, which was scribed in my presence. The recorded information has been reviewed and is accurate.    Trixie Dredge, PA-C 11/21/12 2022

## 2012-11-21 NOTE — ED Provider Notes (Signed)
Pt signed out to me by Trixie Dredge, PA-C at shift change. UA pending.  If normal, pt may be discharged home.   UA: unremarkable. Will discharge pt home with discharge instructions already given to pt by Trixie Dredge, PA-C.      Junius Finner, PA-C 11/21/12 2102  9:41 PM  Pt requesting orthopedic referral as she states she cannot ask her PCP because her PCP  "does not do anything."  Will give pt info for [facility].  Advised pt she may not be able to get an appointment for a few weeks but she can call and ask.  Junius Finner, PA-C 11/21/12 2143

## 2012-11-21 NOTE — ED Notes (Signed)
Pt upset and feels that nothing was done to help her with her pain; pt requesting referral to Ortho to evaluate back pain; pt advised of medications that were prescribed and that she needed to follow up with PCP; pt states "I just keep spending money and nothing is getting done"; pt advised that per examination the PA feels that her pain is muscle related and that she was prescribed muscle relaxers and something for the pain. Pt verbalized understanding and states "I just want my paperwork and to leave"; Pt declined to sign DC instructions or to retake vital signs; pt ambulated out the ER without difficulty and with no assistance needed.

## 2012-11-21 NOTE — ED Provider Notes (Signed)
Medical screening examination/treatment/procedure(s) were performed by non-physician practitioner and as supervising physician I was immediately available for consultation/collaboration.  EKG Interpretation   None         Meela Wareing H Emmet Messer, MD 11/21/12 2257 

## 2012-12-18 ENCOUNTER — Ambulatory Visit (INDEPENDENT_AMBULATORY_CARE_PROVIDER_SITE_OTHER): Payer: BC Managed Care – PPO | Admitting: Physician Assistant

## 2012-12-18 VITALS — BP 118/82 | HR 64 | Temp 99.5°F | Resp 18 | Ht 63.0 in | Wt 253.6 lb

## 2012-12-18 DIAGNOSIS — L02419 Cutaneous abscess of limb, unspecified: Secondary | ICD-10-CM

## 2012-12-18 MED ORDER — TRAMADOL HCL 50 MG PO TABS: 50.0000 mg | ORAL_TABLET | Freq: Three times a day (TID) | ORAL | Status: DC | PRN

## 2012-12-18 MED ORDER — SULFAMETHOXAZOLE-TRIMETHOPRIM 800-160 MG PO TABS: 1.0000 | ORAL_TABLET | Freq: Two times a day (BID) | ORAL | Status: DC

## 2012-12-18 MED ORDER — FLUCONAZOLE 150 MG PO TABS: 150.0000 mg | ORAL_TABLET | Freq: Once | ORAL | Status: DC

## 2012-12-18 MED ORDER — MUPIROCIN CALCIUM 2 % NA OINT: TOPICAL_OINTMENT | NASAL | Status: DC

## 2012-12-18 NOTE — Progress Notes (Signed)
  [address]  [place], [place]  [phone]  [email or web address]  Subjective:    Patient ID: Amanda [name], female    DOB: 19-Feb-1980, 32 y.o.   MRN: [id number]  HPI   Amanda [name] is a very pleasant 32 yr old female here with concern for an abscess on her right lower leg.  Developed about 1 wk ago, progressively becoming more red and painful.  Area felt "hot" last night.  Hardly able to walk today.  History of MRSA abscesses several years ago.  Denies fever or chills.  Used an alcohol pad to the area with no relief.  Did not want to mess with it.    Requests pain medicine.  Allergies to doxy and amox  Needs note as she had to miss an appointment today  Review of Systems  Constitutional: Negative for fever and chills.  Gastrointestinal: Negative for nausea and vomiting.  Musculoskeletal: Positive for arthralgias, gait problem and myalgias.  Skin: Positive for color change.  Neurological: Negative.        Objective:   Physical Exam  Vitals reviewed. Constitutional: She is oriented to person, place, and time. She appears well-developed and well-nourished. No distress.  HENT:  Head: Normocephalic and atraumatic.  Eyes: Conjunctivae are normal. No scleral icterus.  Pulmonary/Chest: Effort normal.  Neurological: She is alert and oriented to person, place, and time.  Skin: Skin is warm and dry.     Abscess of right lower leg; indurated, erythematous, and very warm; no spontaneous drainage; extremely TTP; small area of fluctuance centrally  Psychiatric: She has a normal mood and affect. Her behavior is normal.    Procedure Note: Verbal consent obtained.  Local anesthesia with 2 cc 2% lidocaine.  Betadine prep.  Incision with 11 blade.  Moderate purulence expressed.  Cx collected.  Wound irrigated with remaining anesthetic.  Packed with 1/4 inch plain packing.  Cleansed and dressed.       Assessment & Plan:  Cellulitis and abscess of leg - Plan:  sulfamethoxazole-trimethoprim (BACTRIM DS,SEPTRA DS) 800-160 MG per tablet, traMADol (ULTRAM) 50 MG tablet, Wound culture   Amanda [name] is a very pleasant 32 yr old female here with abscess of the right lower leg.  I&D as above.  Wound culture sent.  Given pt allergies, will start TMP/SMX x 10 days.  Encouraged elevation and frequent hot compresses over the next 48 hours.  Daily and prn dressing changes.  RTC 11/28 for wound care - fast track card given.  Sooner if concerns.  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  We discussed decolonization which pt would like to pursue.  Mupirocin nasal BID x 5 days.  Hibiclens washes daily x 10 days.    Pt requests fluconazole as she tends to develop yeast infections with abx.   Loleta Dicker MHS, PA-C [facility] 11/25/20141:44 PM

## 2012-12-18 NOTE — Patient Instructions (Signed)
Begin taking the antibiotic (sulfamethoxzaole/trimethoprim) as directed. Be sure to finish the full course.  Elevated the leg and rest today.  Frequent hot compresses today and tomorrow.  Change the dressing at least once daily - more frequently if the bandage is getting wet or saturated.  Recheck on Friday 11/28  To help get rid of the MRSA.  Use the nasal ointment (mupriocin) as directed twice daily for 5 days.  Also buy Hibiclens at the pharmacy and wash with this once daily for 10 days.  These treatments will only work if used TOGETHER - you cannot do one or the other.   Abscess An abscess is an infected area that contains a collection of pus and debris.It can occur in almost any part of the body. An abscess is also known as a furuncle or boil. CAUSES  An abscess occurs when tissue gets infected. This can occur from blockage of oil or sweat glands, infection of hair follicles, or a minor injury to the skin. As the body tries to fight the infection, pus collects in the area and creates pressure under the skin. This pressure causes pain. People with weakened immune systems have difficulty fighting infections and get certain abscesses more often.  SYMPTOMS Usually an abscess develops on the skin and becomes a painful mass that is red, warm, and tender. If the abscess forms under the skin, you may feel a moveable soft area under the skin. Some abscesses break open (rupture) on their own, but most will continue to get worse without care. The infection can spread deeper into the body and eventually into the bloodstream, causing you to feel ill.  DIAGNOSIS  Your caregiver will take your medical history and perform a physical exam. A sample of fluid may also be taken from the abscess to determine what is causing your infection. TREATMENT  Your caregiver may prescribe antibiotic medicines to fight the infection. However, taking antibiotics alone usually does not cure an abscess. Your caregiver may need to  make a small cut (incision) in the abscess to drain the pus. In some cases, gauze is packed into the abscess to reduce pain and to continue draining the area. HOME CARE INSTRUCTIONS   Only take over-the-counter or prescription medicines for pain, discomfort, or fever as directed by your caregiver.  If you were prescribed antibiotics, take them as directed. Finish them even if you start to feel better.  If gauze is used, follow your caregiver's directions for changing the gauze.  To avoid spreading the infection:  Keep your draining abscess covered with a bandage.  Wash your hands well.  Do not share personal care items, towels, or whirlpools with others.  Avoid skin contact with others.  Keep your skin and clothes clean around the abscess.  Keep all follow-up appointments as directed by your caregiver. SEEK MEDICAL CARE IF:   You have increased pain, swelling, redness, fluid drainage, or bleeding.  You have muscle aches, chills, or a general ill feeling.  You have a fever. MAKE SURE YOU:   Understand these instructions.  Will watch your condition.  Will get help right away if you are not doing well or get worse. Document Released: 10/20/2004 Document Revised: 07/12/2011 Document Reviewed: 03/25/2011 [facility] Patient Information 2014 [place], [place].

## 2012-12-21 ENCOUNTER — Ambulatory Visit (INDEPENDENT_AMBULATORY_CARE_PROVIDER_SITE_OTHER): Payer: BC Managed Care – PPO | Admitting: Physician Assistant

## 2012-12-21 VITALS — BP 122/76 | HR 94 | Temp 98.6°F | Resp 17 | Ht 63.0 in | Wt 255.0 lb

## 2012-12-21 DIAGNOSIS — Z111 Encounter for screening for respiratory tuberculosis: Secondary | ICD-10-CM

## 2012-12-21 DIAGNOSIS — L02419 Cutaneous abscess of limb, unspecified: Secondary | ICD-10-CM

## 2012-12-21 DIAGNOSIS — M25561 Pain in right knee: Secondary | ICD-10-CM

## 2012-12-21 LAB — WOUND CULTURE: Gram Stain: NONE SEEN

## 2012-12-21 NOTE — Progress Notes (Signed)
  Tuberculosis Risk Questionnaire  1. No Were you born outside the [place] in one of the following parts of the world: [place], [place], [place], [place] or [place]?    2. No Have you traveled outside the [place] and lived for more than one month in one of the following parts of the world: [place], [place], [place], [place] or [place]?    3. No Do you have a compromised immune system such as from any of the following conditions:HIV/AIDS, organ or bone marrow transplantation, diabetes, immunosuppressive medicines (e.g. Prednisone, Remicaide), leukemia, lymphoma, cancer of the head or neck, gastrectomy or jejunal bypass, end-stage renal disease (on dialysis), or silicosis?     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    5. No Have you ever: injected illegal drugs, used crack cocaine, lived in a homeless shelter  or been in jail or prison?     6. No Have you ever been exposed to anyone with infectious tuberculosis?    Tuberculosis Symptom Questionnaire  Do you currently have any of the following symptoms?  1. No Unexplained cough lasting more than 3 weeks?   2. No Unexplained fever lasting more than 3 weeks.   3. No Night Sweats (sweating that leaves the bedclothes and sheets wet)     4. Yes with exertion Shortness of Breath   5. No Chest Pain   6. No Unintentional weight loss    7. No Unexplained fatigue (very tired for no reason)

## 2012-12-21 NOTE — Progress Notes (Signed)
Patient ID: Amanda [name] MRN: [id number], DOB: Jun 09, 1980 32 y.o. Date of Encounter: 12/21/2012, 1:24 PM  Primary Physician: Nicki Reaper, NP  Chief Complaint: Wound care   See previous note  HPI: 32 y.o. female presents for wound care s/p I&D on 12/18/12 Doing well No issues or complaints Afebrile/ no chills No nausea or vomiting Tolerating Bactrim DS Pain improving Daily dressing change Previous note reviewed  She also requests a PPD for work  Past Medical History  Diagnosis Date  . Asthma   . GERD (gastroesophageal reflux disease)   . Headache(784.0)   . Anxiety   . Environmental allergies   . Acid reflux   . H/O hiatal hernia   . Depression   . Heart murmur   . Allergy   . Genital warts      Home Meds: Prior to Admission medications   Medication Sig Start Date End Date Taking? Authorizing Provider  albuterol (PROVENTIL HFA;VENTOLIN HFA) 108 (90 BASE) MCG/ACT inhaler Inhale 2 puffs into the lungs every 6 (six) hours as needed for wheezing. 07/12/12  Yes Thao P Le, DO  cyclobenzaprine (FLEXERIL) 10 MG tablet Take 10 mg by mouth 3 (three) times daily as needed for muscle spasms.   Yes Historical Provider, MD  docusate sodium (COLACE) 100 MG capsule Take 1 capsule (100 mg total) by mouth 2 (two) times daily as needed for constipation. 11/21/12  Yes Trixie Dredge, PA-C  fluconazole (DIFLUCAN) 150 MG tablet Take 1 tablet (150 mg total) by mouth once. Repeat if needed 12/18/12  Yes Eleanore Delia Chimes, PA-C  mupirocin nasal ointment (BACTROBAN) 2 % Use one-half of tube in each nostril twice daily for five (5) days. After application, press sides of nose together and gently massage. 12/18/12  Yes Eleanore E Debbra Riding, PA-C  sulfamethoxazole-trimethoprim (BACTRIM DS,SEPTRA DS) 800-160 MG per tablet Take 1 tablet by mouth 2 (two) times daily. 12/18/12  Yes Eleanore E Egan, PA-C  terbinafine (LAMISIL) 250 MG tablet Take 250 mg by mouth daily.   Yes Historical Provider, MD    traMADol (ULTRAM) 50 MG tablet Take 1 tablet (50 mg total) by mouth every 8 (eight) hours as needed. 12/18/12  Yes Godfrey Pick, PA-C    Allergies:  Allergies  Allergen Reactions  . Doxycycline Hyclate Hives, Itching and Swelling    REACTION: swelling of face  . Ketorolac Tromethamine Other (See Comments)    Patient stated that it makes her extremely hyper  . Amoxicillin Swelling and Rash    REACTION: facial swelling and rash.    ROS: Constitutional: Afebrile, no chills Dermatological: Positive for wound, small amount of erythema, and improving pain. Negative for warmth  GI: No nausea or vomiting   EXAM: Physical Exam: Blood pressure 122/76, pulse 94, temperature 98.6 F (37 C), temperature source Oral, resp. rate 17, height 5\' 3"  (1.6 m), weight 255 lb (115.667 kg), last menstrual period 12/18/2012, SpO2 98.00%., Body mass index is 45.18 kg/(m^2). General: Well developed, well nourished, in no acute distress. Nontoxic appearing. Head: Normocephalic, atraumatic, sclera non-icteric.  Neck: Supple. Lungs: Breathing is unlabored. Heart: Normal rate. Skin:  Warm and moist. Dressing and packing in place. Small amount of local induration, erythema, and tenderness to palpation. Neuro: Alert and oriented X 3. Moves all extremities spontaneously. Normal gait.  Psych:  Responds to questions appropriately with a normal affect.   PROCEDURE: Dressing and packing removed. Small amount of purulence expressed Wound bed healthy Irrigated with 1% plain lidocaine 5 cc. Repacked with  small amount of 1/4 inch plain packing Dressing applied  LAB: Culture: MRSA  A/P: 31 y.o. female with cellulitis/abscess as above s/p I&D on 12/18/12 and requests a PPD  1) Wound care -Wound care per above -Continue Bactrim DS -Pain well controlled -Daily dressing changes -Recheck 48-72 hours  2) PPD -PPD placed -RTC 48-72 hours for reading  Signed, Eula Listen, PA-C [facility] [place], [place] [address] [phone] 12/21/2012 1:24 PM

## 2012-12-23 ENCOUNTER — Ambulatory Visit (INDEPENDENT_AMBULATORY_CARE_PROVIDER_SITE_OTHER): Payer: BC Managed Care – PPO | Admitting: Physician Assistant

## 2012-12-23 VITALS — BP 120/72 | HR 95 | Temp 98.6°F | Resp 18 | Ht 64.0 in | Wt 253.0 lb

## 2012-12-23 DIAGNOSIS — L02419 Cutaneous abscess of limb, unspecified: Secondary | ICD-10-CM

## 2012-12-23 DIAGNOSIS — Z111 Encounter for screening for respiratory tuberculosis: Secondary | ICD-10-CM

## 2012-12-23 DIAGNOSIS — Z09 Encounter for follow-up examination after completed treatment for conditions other than malignant neoplasm: Secondary | ICD-10-CM

## 2012-12-23 NOTE — Progress Notes (Signed)
   Subjective:    Patient ID: Amanda [name], female    DOB: 16-Oct-1980, 32 y.o.   MRN: [id number]  HPI Pt presents to clinic for wound recheck and PPD read.  She started the abx but only took 2 pills and decided she did not want to take them anymore.  She has not yet used the nasal cream nor the Hibiclens washes.  She has been keeping it covered and changing the drsg daily.  Review of Systems  Constitutional: Negative for fever and chills.  Gastrointestinal: Negative for nausea.  Skin: Positive for wound.       Objective:   Physical Exam  Vitals reviewed. Constitutional: She is oriented to person, place, and time. She appears well-developed and well-nourished.  HENT:  Head: Normocephalic and atraumatic.  Right Ear: Hearing, tympanic membrane, external ear and ear canal normal.  Left Ear: Hearing, tympanic membrane, external ear and ear canal normal.  Nose: Nose normal.  Mouth/Throat: Uvula is midline, oropharynx is clear and moist and mucous membranes are normal.  Pulmonary/Chest: Effort normal.  Neurological: She is alert and oriented to person, place, and time.  Skin: Skin is warm and dry.  Drsg and packing removed.  No purulence expressed from the wound. No erythema.  Induration about 1.5cm from wound edges.  Irrigated with 1% lido.  Repacked with 1/4in plain packing ~1/2-1cm - wound is about as deep as the wound is wide but will pack today and have her RTC 1 more time due to non-compliance with abx,  Psychiatric: She has a normal mood and affect. Her behavior is normal. Judgment and thought content normal.   Results for orders placed in visit on 12/21/12  TB SKIN TEST      Result Value Range   TB Skin Test Negative     Induration 0 mm         Assessment & Plan:  Cellulitis and abscess of leg -  Stress importance of taking her abx.  She will start them again.  She will keep changing the drsg daily.  She will recheck in 2 days mainly to make sure she is taking the  abx - the wound is close to not needing to be repacked but I felt another f/u was important due to non-compliance.  Encounter for PPD skin test reading - letter given to patient.  Benny Lennert PA-C 12/23/2012 4:31 PM

## 2012-12-26 ENCOUNTER — Ambulatory Visit (INDEPENDENT_AMBULATORY_CARE_PROVIDER_SITE_OTHER): Payer: BC Managed Care – PPO | Admitting: Physician Assistant

## 2012-12-26 VITALS — BP 110/62 | HR 95 | Temp 98.2°F | Resp 18 | Wt 253.0 lb

## 2012-12-26 DIAGNOSIS — L02419 Cutaneous abscess of limb, unspecified: Secondary | ICD-10-CM

## 2012-12-26 MED ORDER — MUPIROCIN CALCIUM 2 % NA OINT: TOPICAL_OINTMENT | NASAL | Status: DC

## 2012-12-26 NOTE — Progress Notes (Signed)
   Subjective:    Patient ID: Amanda [name], female    DOB: 05-29-1980, 32 y.o.   MRN: [id number]  Wound Check     Amanda [name] is a pleasant 32 yr old female here for wound care following I&D of an abscess 12/18/12.  She reports she is improving.  Still with some tenderness.  Changing the dressing regularly.  She reports that she is taking the antibiotics as directed.  The mupirocin nasal oint was too expensive at [facility] - would like like this sent to [facility] instead  Concerned she may be developing an abscess on her buttock  Review of Systems  Constitutional: Negative for fever and chills.  Gastrointestinal: Negative.   Musculoskeletal: Negative.   Skin: Positive for wound.       Objective:   Physical Exam  Vitals reviewed. Constitutional: She is oriented to person, place, and time. She appears well-developed and well-nourished. No distress.  HENT:  Head: Normocephalic and atraumatic.  Eyes: Conjunctivae are normal. No scleral icterus.  Pulmonary/Chest: Effort normal.  Neurological: She is alert and oriented to person, place, and time.  Skin: Skin is warm and dry.     Healing wound at right anterior lower leg; no surrounding induration or erythema; slight TTP; no drainage  Psychiatric: She has a normal mood and affect. Her behavior is normal.       Assessment & Plan:  Cellulitis and abscess of leg  Amanda [name] is a pleasant 32 yr old female here for wound care.  I have not repacked the wound today - continue daily dressing changes until completely healed.  RTC if concerns.  Finish the full course of Bactrim as directed.  Encouraged pt to apply frequent hot compresses to buttock area.  Since she is already on abx, hopeful that this will resolve.  If worsening, to RTC for possible drainage.  I have sent mupirocin nasal ointment to [facility] per pt's request   Meds ordered this encounter  Medications  . mupirocin nasal ointment (BACTROBAN) 2 %    Sig: Use  one-half of tube in each nostril twice daily for five (5) days. After application, press sides of nose together and gently massage.    Dispense:  10 g    Refill:  0    Order Specific Question:  Supervising Provider    Answer:  Ethelda Chick [[id number]]    Loleta Dicker MHS, PA-C [facility] 12/3/20142:18 PM

## 2012-12-27 ENCOUNTER — Other Ambulatory Visit: Payer: Self-pay | Admitting: Radiology

## 2012-12-27 MED ORDER — MUPIROCIN 2 % EX OINT: TOPICAL_OINTMENT | CUTANEOUS | Status: DC

## 2013-01-07 ENCOUNTER — Encounter (HOSPITAL_COMMUNITY): Payer: Self-pay | Admitting: *Deleted

## 2013-01-07 ENCOUNTER — Inpatient Hospital Stay (HOSPITAL_COMMUNITY)
Admission: AD | Admit: 2013-01-07 | Discharge: 2013-01-07 | Disposition: A | Discharge status: Home / Self Care | Payer: Medicaid Other | Source: Ambulatory Visit | Attending: Obstetrics and Gynecology | Admitting: Obstetrics and Gynecology

## 2013-01-07 DIAGNOSIS — B9689 Other specified bacterial agents as the cause of diseases classified elsewhere: Secondary | ICD-10-CM | POA: Insufficient documentation

## 2013-01-07 DIAGNOSIS — R1032 Left lower quadrant pain: Secondary | ICD-10-CM | POA: Insufficient documentation

## 2013-01-07 DIAGNOSIS — N760 Acute vaginitis: Secondary | ICD-10-CM | POA: Insufficient documentation

## 2013-01-07 DIAGNOSIS — L293 Anogenital pruritus, unspecified: Secondary | ICD-10-CM | POA: Insufficient documentation

## 2013-01-07 DIAGNOSIS — A499 Bacterial infection, unspecified: Secondary | ICD-10-CM | POA: Insufficient documentation

## 2013-01-07 HISTORY — DX: Constipation, unspecified: K59.00

## 2013-01-07 LAB — URINALYSIS, ROUTINE W REFLEX MICROSCOPIC
Bilirubin Urine: NEGATIVE
Glucose, UA: NEGATIVE mg/dL
Ketones, ur: NEGATIVE mg/dL
Protein, ur: NEGATIVE mg/dL
Specific Gravity, Urine: 1.015 (ref 1.005–1.030)
Urobilinogen, UA: 0.2 mg/dL (ref 0.0–1.0)
pH: 7 (ref 5.0–8.0)

## 2013-01-07 LAB — WET PREP, GENITAL: Yeast Wet Prep HPF POC: NONE SEEN

## 2013-01-07 LAB — URINE MICROSCOPIC-ADD ON

## 2013-01-07 LAB — POCT PREGNANCY, URINE: Preg Test, Ur: NEGATIVE

## 2013-01-07 MED ORDER — METRONIDAZOLE 500 MG PO TABS: 500.0000 mg | ORAL_TABLET | Freq: Two times a day (BID) | ORAL | Status: DC

## 2013-01-07 NOTE — MAU Provider Note (Signed)
History     CSN: [id number]  Arrival date and time: 01/07/13 1628   None     No chief complaint on file.  HPI This is a 32 y.o. female who presents with c/o LLQ pain for 3 days, as well as vaginal discharge with itching. 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  States is not very worried as they usually go away. Has long history of constipation, has not tried anything this week.  Was on antibiotic recently for Abscess on right leg and they gave her Diflucan for yeast infection. Took it 3 days ago and some itching still remains and discharge went from thick to watery.   OB History   Grav Para Term Preterm Abortions TAB SAB Ect Mult Living   2 1 1  0 1 0 1 0 0 1      Past Medical History  Diagnosis Date  . Asthma   . GERD (gastroesophageal reflux disease)   . Headache(784.0)   . Anxiety   . Environmental allergies   . Acid reflux   . H/O hiatal hernia   . Depression   . Heart murmur   . Allergy   . Genital warts   . Constipation     Past Surgical History  Procedure Laterality Date  . Cervical cone biopsy    . Marsupialization urethral diverticulum    . Cesarean section  01/20/2012    Procedure: CESAREAN SECTION;  Surgeon: [name], MD;  Location: [facility];  Service: Obstetrics;  Laterality: N/A;  . Upper gi endoscopy      Family History  Problem Relation Age of Onset  . Diabetes Mother   . Hypertension Mother   . Depression Mother   . Depression Father   . Anesthesia problems Neg Hx   . Breast cancer Maternal Aunt     [employer]  . Heart disease Maternal Grandmother   . Diabetes Maternal Grandfather   . Heart disease Maternal Grandfather     History  Substance Use Topics  . Smoking status: Never Smoker   . Smokeless tobacco: Never Used  . Alcohol Use: No    Allergies:  Allergies  Allergen Reactions  . Doxycycline Hyclate Hives, Itching and Swelling    REACTION: swelling of face  . Ketorolac  Tromethamine Other (See Comments)    Patient stated that it makes her extremely hyper  . Amoxicillin Swelling and Rash    REACTION: facial swelling and rash.    Prescriptions prior to admission  Medication Sig Dispense Refill  . cyclobenzaprine (FLEXERIL) 10 MG tablet Take 10 mg by mouth 3 (three) times daily as needed for muscle spasms.      Marland Kitchen docusate sodium (COLACE) 100 MG capsule Take 1 capsule (100 mg total) by mouth 2 (two) times daily as needed for constipation.  20 capsule  0  . sulfamethoxazole-trimethoprim (BACTRIM DS,SEPTRA DS) 800-160 MG per tablet Take 1 tablet by mouth 2 (two) times daily.  20 tablet  0  . terbinafine (LAMISIL) 250 MG tablet Take 250 mg by mouth daily.      Marland Kitchen albuterol (PROVENTIL HFA;VENTOLIN HFA) 108 (90 BASE) MCG/ACT inhaler Inhale 2 puffs into the lungs every 6 (six) hours as needed for wheezing.  1 Inhaler  3  . fluconazole (DIFLUCAN) 150 MG tablet Take 1 tablet (150 mg total) by mouth once. Repeat if needed  2 tablet  0  . mupirocin ointment (BACTROBAN) 2 % Apply  small amount into each nostril twice daily for 5 days  22 g  0  . traMADol (ULTRAM) 50 MG tablet Take 1 tablet (50 mg total) by mouth every 8 (eight) hours as needed.  30 tablet  0    Review of Systems  Constitutional: Negative for fever, chills and malaise/fatigue.  Gastrointestinal: Positive for abdominal pain (LLQ) and constipation. Negative for nausea, vomiting and diarrhea.  Genitourinary: Negative for dysuria.  Neurological: Negative for focal weakness.   Physical Exam   Blood pressure 114/63, pulse 75, temperature 98.4 F (36.9 C), temperature source Oral, resp. rate 18, height 5\' 3"  (1.6 m), weight 108.863 kg (240 lb), last menstrual period 12/18/2012.  Physical Exam  Constitutional: She is oriented to person, place, and time. She appears well-developed and well-nourished. No distress.  HENT:  Head: Normocephalic.  Cardiovascular: Normal rate.   Respiratory: Effort normal.  GI:  Soft. She exhibits no distension and no mass. There is tenderness (slight on LLQ). There is no rebound and no guarding.  Genitourinary: Uterus normal. Vaginal discharge (thin white) found.  Cervix long and closed Uterus small and nontender adnexae nontender   Musculoskeletal: Normal range of motion.  Neurological: She is alert and oriented to person, place, and time.  Skin: Skin is warm and dry.  Psychiatric: She has a normal mood and affect.    MAU Course  Procedures  MDM GC/Chlamydia and wet prep sent  Results for orders placed during the hospital encounter of 01/07/13 (from the past 24 hour(s))  URINALYSIS, ROUTINE W REFLEX MICROSCOPIC     Status: Abnormal   Collection Time    01/07/13  5:29 PM      Result Value Range   Color, Urine YELLOW  YELLOW   APPearance CLEAR  CLEAR   Specific Gravity, Urine 1.015  1.005 - 1.030   pH 7.0  5.0 - 8.0   Glucose, UA NEGATIVE  NEGATIVE mg/dL   Hgb urine dipstick TRACE (*) NEGATIVE   Bilirubin Urine NEGATIVE  NEGATIVE   Ketones, ur NEGATIVE  NEGATIVE mg/dL   Protein, ur NEGATIVE  NEGATIVE mg/dL   Urobilinogen, UA 0.2  0.0 - 1.0 mg/dL   Nitrite NEGATIVE  NEGATIVE   Leukocytes, UA SMALL (*) NEGATIVE  URINE MICROSCOPIC-ADD ON     Status: Abnormal   Collection Time    01/07/13  5:29 PM      Result Value Range   Squamous Epithelial / LPF FEW (*) RARE   WBC, UA 3-6  <3 WBC/hpf   RBC / HPF 0-2  <3 RBC/hpf  POCT PREGNANCY, URINE     Status: None   Collection Time    01/07/13  5:58 PM      Result Value Range   Preg Test, Ur NEGATIVE  NEGATIVE  WET PREP, GENITAL     Status: Abnormal   Collection Time    01/07/13  6:00 PM      Result Value Range   Yeast Wet Prep HPF POC NONE SEEN  NONE SEEN   Trich, Wet Prep NONE SEEN  NONE SEEN   Clue Cells Wet Prep HPF POC FEW (*) NONE SEEN   WBC, Wet Prep HPF POC MODERATE (*) NONE SEEN    Assessment and Plan  A:  Left lower quadrant pain, possible ovulatory vs constipation      Mild Bacterial  vaginosis  P:  Discussed with Dr Ellyn Hack      Rx Flagyl      Recommend Miralax and daily fiber  therapy     followup with Dr Ellyn Hack if pain persists. Patient declined pelvic ultrasound  [facility] 01/07/2013, 6:10 PM

## 2013-01-08 LAB — GC/CHLAMYDIA PROBE AMP: GC Probe RNA: POSITIVE — AB

## 2013-01-10 ENCOUNTER — Ambulatory Visit (INDEPENDENT_AMBULATORY_CARE_PROVIDER_SITE_OTHER): Payer: Medicaid Other

## 2013-01-10 VITALS — BP 116/71 | HR 88 | Wt 256.8 lb

## 2013-01-10 DIAGNOSIS — Z202 Contact with and (suspected) exposure to infections with a predominantly sexual mode of transmission: Secondary | ICD-10-CM

## 2013-01-10 MED ORDER — AZITHROMYCIN 250 MG PO TABS
1000.0000 mg | ORAL_TABLET | Freq: Once | ORAL | Status: AC
  Administered 2013-01-10: 1000 mg via ORAL

## 2013-01-10 MED ORDER — CEFTRIAXONE SODIUM 1 G IJ SOLR
250.0000 mg | Freq: Once | INTRAMUSCULAR | Status: AC
  Administered 2013-01-10: 250 mg via INTRAMUSCULAR

## 2013-06-03 ENCOUNTER — Other Ambulatory Visit: Payer: Self-pay | Admitting: Family Medicine

## 2013-08-09 ENCOUNTER — Encounter (HOSPITAL_COMMUNITY): Payer: Self-pay | Admitting: Emergency Medicine

## 2013-08-09 ENCOUNTER — Emergency Department (HOSPITAL_COMMUNITY)
Admission: EM | Admit: 2013-08-09 | Discharge: 2013-08-09 | Disposition: A | Discharge status: Home / Self Care | Payer: Medicaid Other | Attending: Emergency Medicine | Admitting: Emergency Medicine

## 2013-08-09 DIAGNOSIS — Z8659 Personal history of other mental and behavioral disorders: Secondary | ICD-10-CM | POA: Diagnosis not present

## 2013-08-09 DIAGNOSIS — Z79899 Other long term (current) drug therapy: Secondary | ICD-10-CM | POA: Insufficient documentation

## 2013-08-09 DIAGNOSIS — R011 Cardiac murmur, unspecified: Secondary | ICD-10-CM | POA: Diagnosis not present

## 2013-08-09 DIAGNOSIS — Z8619 Personal history of other infectious and parasitic diseases: Secondary | ICD-10-CM | POA: Insufficient documentation

## 2013-08-09 DIAGNOSIS — M25569 Pain in unspecified knee: Secondary | ICD-10-CM | POA: Insufficient documentation

## 2013-08-09 DIAGNOSIS — Z8719 Personal history of other diseases of the digestive system: Secondary | ICD-10-CM | POA: Diagnosis not present

## 2013-08-09 DIAGNOSIS — J45909 Unspecified asthma, uncomplicated: Secondary | ICD-10-CM | POA: Diagnosis not present

## 2013-08-09 DIAGNOSIS — M25561 Pain in right knee: Secondary | ICD-10-CM

## 2013-08-09 MED ORDER — TRAMADOL-ACETAMINOPHEN 37.5-325 MG PO TABS: 1.0000 | ORAL_TABLET | Freq: Four times a day (QID) | ORAL | Status: DC | PRN

## 2013-08-09 NOTE — ED Notes (Signed)
1257-pt refusing xray. States that she feels like her pain is muscular and does not want "unnecessary medical procedures"

## 2013-08-09 NOTE — ED Notes (Signed)
The pt is in peds with her child

## 2013-08-09 NOTE — ED Provider Notes (Signed)
CSN: [id number]     Arrival date & time 08/09/13  1120 History  This chart was scribed for non-physician practitioner, Quincy Carnes, PA-C,working with Wandra Arthurs, MD, by Marlowe Kays, ED Scribe.  This patient was seen in room TR06C/TR06C and the patient's care was started at 1:29 PM.  Chief Complaint  Patient presents with  . Knee Pain   The history is provided by the patient. No language interpreter was used.   HPI Comments:  Amanda [name] is a 33 y.o. obese female who presents to the Emergency Department complaining of worsening right knee pain onset four days ago. Pt states the pain radiates up away from her knee and down into her shin. She states lifting, moving, or walking makes the pain worse. She states she has taken Aleve and Flexeril with no relief. She reports popping of the knee upon standing and walking but nothing out of the ordinary. She states it feels as if the knee is going to give out on her. She denies any trauma, fall, or injury. She denies any previous surgery to the knee.  Denies numbness, paresthesias, or weakness of right leg.    Past Medical History  Diagnosis Date  . Asthma   . GERD (gastroesophageal reflux disease)   . Headache(784.0)   . Anxiety   . Environmental allergies   . Acid reflux   . H/O hiatal hernia   . Depression   . Heart murmur   . Allergy   . Genital warts   . Constipation    Past Surgical History  Procedure Laterality Date  . Cervical cone biopsy    . Marsupialization urethral diverticulum    . Cesarean section  01/20/2012    Procedure: CESAREAN SECTION;  Surgeon: Melina Schools, MD;  Location: [facility];  Service: Obstetrics;  Laterality: N/A;  . Upper gi endoscopy     Family History  Problem Relation Age of Onset  . Diabetes Mother   . Hypertension Mother   . Depression Mother   . Depression Father   . Anesthesia problems Neg Hx   . Breast cancer Maternal Aunt     [employer]  . Heart disease Maternal Grandmother    . Diabetes Maternal Grandfather   . Heart disease Maternal Grandfather    History  Substance Use Topics  . Smoking status: Never Smoker   . Smokeless tobacco: Never Used  . Alcohol Use: No   OB History   Grav Para Term Preterm Abortions TAB SAB Ect Mult Living   2 1 1  0 1 0 1 0 0 1     Review of Systems  Musculoskeletal: Positive for arthralgias.  All other systems reviewed and are negative.   Allergies  Doxycycline hyclate; Ketorolac tromethamine; and Amoxicillin  Home Medications   Prior to Admission medications   Medication Sig Start Date End Date Taking? Authorizing Provider  albuterol (PROVENTIL HFA;VENTOLIN HFA) 108 (90 BASE) MCG/ACT inhaler Inhale 2 puffs into the lungs every 6 (six) hours as needed for wheezing. 07/12/12  Yes Thao P Le, DO  cetirizine (ZYRTEC) 10 MG tablet TAKE 1 TABLET BY MOUTH DAILY   Yes Eleanore E Egan, PA-C  cyclobenzaprine (FLEXERIL) 10 MG tablet Take 10 mg by mouth 3 (three) times daily as needed for muscle spasms.   Yes Historical Provider, MD   Triage Vitals: BP 124/86  Pulse 79  Temp(Src) 98 F (36.7 C) (Oral)  Resp 20  SpO2 100% Physical Exam  Nursing note and vitals  reviewed. Constitutional: She is oriented to person, place, and time. She appears well-developed and well-nourished.  HENT:  Head: Normocephalic and atraumatic.  Mouth/Throat: Oropharynx is clear and moist.  Eyes: Conjunctivae and EOM are normal. Pupils are equal, round, and reactive to light.  Neck: Normal range of motion.  Cardiovascular: Normal rate, regular rhythm and normal heart sounds.   Pulmonary/Chest: Effort normal and breath sounds normal.  Abdominal: Soft. Bowel sounds are normal.  Musculoskeletal: Normal range of motion. She exhibits no edema and no tenderness.  Exam limited due to large body habitus. Right knee nontender to palpation. Pain with flexion and extension. No swelling or overlying erythema. Leg NVI. No calf asymmetry, tenderness, or palpable  cords; no overlying erythema or warmth to touch  Neurological: She is alert and oriented to person, place, and time.  Skin: Skin is warm and dry.  Psychiatric: She has a normal mood and affect.    ED Course  Procedures (including critical care time) DIAGNOSTIC STUDIES: Oxygen Saturation is 100% on RA, normal by my interpretation.   COORDINATION OF CARE: 1:35 PM- Offered to X-Ray the right knee but pt declined. Will refer to orthopedist and prescribe pain medication. Will provide  Pt verbalizes understanding and agrees to plan.  Medications - No data to display  Labs Review Labs Reviewed - No data to display  Imaging Review No results found.   EKG Interpretation None      MDM   Final diagnoses:  Knee pain, right   Offered x-ray, pt declined.  Leg remains NVI.  No signs/sx concerning for DVT.  Applied knee sleeve, pt states too uncomfortable to wear.  Rx ultracet for pain.  She will FU orthopedics for further eval/management, referral provided.  Discussed plan with patient, he/she acknowledged understanding and agreed with plan of care.  Return precautions given for new or worsening symptoms.  I personally performed the services described in this documentation, which was scribed in my presence. The recorded information has been reviewed and is accurate.  Larene Pickett, PA-C 08/09/13 1420

## 2013-08-09 NOTE — Discharge Instructions (Signed)
Take the prescribed medication as directed. Follow-up with Dr. Percell Miller-- call and schedule appt. Return to the ED for new or worsening symptoms.

## 2013-08-09 NOTE — ED Notes (Addendum)
Rt knee aching x 3 days no new injury  States knows it is not broken. Hurts to lift and move it . And bend states knee has been popping  Has 3 small children w/ her and she drove but she states that she can get someone to come for her

## 2013-08-09 NOTE — ED Notes (Signed)
Right knee pain x3 days. NO known injury or previous surgery. Ambulatory with steady gait, NAD. NO meds PTA

## 2013-08-12 NOTE — ED Provider Notes (Signed)
Medical screening examination/treatment/procedure(s) were performed by non-physician practitioner and as supervising physician I was immediately available for consultation/collaboration.   EKG Interpretation None        Wandra Arthurs, MD 08/12/13 [phone]

## 2013-09-29 ENCOUNTER — Emergency Department (INDEPENDENT_AMBULATORY_CARE_PROVIDER_SITE_OTHER)
Admission: EM | Admit: 2013-09-29 | Discharge: 2013-09-29 | Disposition: A | Discharge status: Home / Self Care | Payer: Medicaid Other | Source: Home / Self Care | Attending: Emergency Medicine | Admitting: Emergency Medicine

## 2013-09-29 ENCOUNTER — Encounter (HOSPITAL_COMMUNITY): Payer: Self-pay | Admitting: Emergency Medicine

## 2013-09-29 DIAGNOSIS — F411 Generalized anxiety disorder: Secondary | ICD-10-CM

## 2013-09-29 DIAGNOSIS — J019 Acute sinusitis, unspecified: Secondary | ICD-10-CM

## 2013-09-29 DIAGNOSIS — J301 Allergic rhinitis due to pollen: Secondary | ICD-10-CM

## 2013-09-29 DIAGNOSIS — G43009 Migraine without aura, not intractable, without status migrainosus: Secondary | ICD-10-CM

## 2013-09-29 DIAGNOSIS — F419 Anxiety disorder, unspecified: Secondary | ICD-10-CM

## 2013-09-29 MED ORDER — FEXOFENADINE HCL 180 MG PO TABS: 180.0000 mg | ORAL_TABLET | Freq: Every day | ORAL | Status: DC

## 2013-09-29 MED ORDER — HYDROXYZINE HCL 25 MG PO TABS
25.0000 mg | ORAL_TABLET | Freq: Four times a day (QID) | ORAL | Status: DC | PRN
Start: 2013-09-29 — End: 2014-08-14

## 2013-09-29 MED ORDER — LEVOFLOXACIN 500 MG PO TABS: 500.0000 mg | ORAL_TABLET | Freq: Every day | ORAL | Status: DC

## 2013-09-29 MED ORDER — CYCLOBENZAPRINE HCL 5 MG PO TABS: ORAL_TABLET | ORAL | Status: DC

## 2013-09-29 MED ORDER — FLUTICASONE PROPIONATE 50 MCG/ACT NA SUSP: 2.0000 | Freq: Every day | NASAL | Status: DC

## 2013-09-29 NOTE — ED Provider Notes (Signed)
Chief Complaint   Chief Complaint  Patient presents with  . Facial Pain    History of Present Illness   Amanda [name] is a 33 year old female who comes in today because of nasal congestion, chronic headaches, and anxiety. Her major complaint has been that of nasal congestion, rhinorrhea, sneezing, itching of the nose, itchy, watery eyes. This has been going on for about 2-3 days, but she notes similar symptoms in the spring and fall. She has sinus pressure and headache, sore throat, chills, slight dry cough. She's had migraine type headaches for years. [facility] and [facility]. She's been trying medications recently and has been on cyclobenzaprine which has helped, but she's run out and now and she is taking Excedrin. She denies any new neurological symptoms, fever, chills, or stiff neck. Finally she has a many year history of anxiety, panic attacks, and has been under lots of stress. She has been unable followup with her primary care physician. She thinks she will go to [employer] [facility]. She's been on Zofran, Lexapro, and Xanax. She's felt mildly depressed, but denies any suicidal or homicidal ideation.  Review of Systems   Other than as noted above, the patient denies any of the following symptoms. Systemic:  No fever, chills, or headache. Eye:  No redness, itching, watering, pain or drainage. ENT:  No earache, ear congestion, sinus pressure or pain, post nasal drip, or sore throat. Lungs:  No cough, sputum production, wheezing, or shortness of breath. Skin:  No rash or itching.  [place]   Past medical history, family history, social history, meds, and allergies were reviewed.  She's allergic to doxycycline, amoxicillin, and Toradol. She also has gastroesophageal reflux and hiatal hernia and takes over-the-counter omeprazole.  Physical Exam     Vital signs:  BP 129/81  Pulse 73  Temp(Src) 98.6 F (37 C) (Oral)  Resp 16  SpO2  98% General:  Alert, in no distress. Eye:  No conjunctival injection or drainage. Lids were normal. ENT:  TMs and canals were normal, without erythema or inflammation.  Nasal mucosa was congested, pale and boggy with clear drainage.  Mucous membranes were moist.  Pharynx was clear, without exudate or drainage.  There were no oral ulcerations or lesions. Neck:  Supple, no adenopathy, tenderness or mass. Lungs:  No respiratory distress.  Lungs were clear to auscultation, without wheezes, rales or rhonchi.  Breath sounds were clear and equal bilaterally. Heart:  Regular rhythm, without gallops, murmers or rubs. Skin:  Clear, warm, and dry, without rash or lesions.  Assessment   The primary encounter diagnosis was Allergic rhinitis due to pollen. Diagnoses of Acute sinusitis, recurrence not specified, unspecified location, Migraine without aura and without status migrainosus, not intractable, and Anxiety were also pertinent to this visit.  Plan     1.  Meds:  The following meds were prescribed:   New Prescriptions   CYCLOBENZAPRINE (FLEXERIL) 5 MG TABLET    1 to 2 tabs every 8 hours as needed for headache   FEXOFENADINE (ALLEGRA) 180 MG TABLET    Take 1 tablet (180 mg total) by mouth daily.   FLUTICASONE (FLONASE) 50 MCG/ACT NASAL SPRAY    Place 2 sprays into both nostrils daily.   HYDROXYZINE (ATARAX/VISTARIL) 25 MG TABLET    Take 1 tablet (25 mg total) by mouth every 6 (six) hours as needed for anxiety.   LEVOFLOXACIN (LEVAQUIN) 500 MG TABLET    Take 1 tablet (500 mg total)  by mouth daily.    2.  Patient Education/Counseling:  The patient was given appropriate handouts, self care instructions, and instructed in symptomatic relief. The patient was instructed in allergen avoidance.    3.  Follow up:  The patient was told to follow up here if no better in 3 to 4 days, or sooner if becoming worse in any way, and given some red flag symptoms such as fever or difficulty breathing which would prompt  immediate return.  Follow up with [facility] regarding her anxiety.        Harden Mo, MD 09/29/13 [phone]

## 2013-09-29 NOTE — ED Notes (Signed)
Patient c/o sinus pain and pressure for approx 2 days, will need a work note to be excused today

## 2013-09-29 NOTE — Discharge Instructions (Signed)
Follow up tomorrow at [facility].  They are located at [address] in downtown [place].  You do not need to have an appointment.  You can just walk in at 9:00 a.m. And they will see you.  In the meantime, if you have any thoughts of hurting yourself or others, go immediately to [facility] Emergency room.   People who suffer from allergies frequently have symptoms of nasal congestion, runny nose, sneezing, itching of the nose, eyes, ears or throat, mucous in the throat, watering of the eyes and cough.  These symptoms are caused by the body's immune response to environmental allergens.  For seasonal allergies this is pollen (tree pollen in the spring, grass pollen in the summer, and weed pollen in the fall).  Year round allergy symptoms are usually caused by dust or mould.  Many people have year round symptoms which are worse seasonally.  For people who have seasonal allergies, pollen avoidance may help to decease symptoms.  This means keeping windows in the house down and windows in the car up.  Run your air conditioning, since this filters out many of the pollen particles.  If you have to spend a prolonged time outdoors during heavy pollen season, it might be prudent to wear a mask.  These can be purchased at any drug store.  When you come in after heavy pollen exposure, your skin, clothing and hair are covered with pollen.  Changing your clothing, taking a shower, and washing your hair may help with your pollen exposure.  Also, your bedding, pillow, and pillowcase may become contaminated with pollen, so frequent washing of your bedding and pillowcase and changing out your pillow may help as well.  (Your pillow can also be a source of dust and mould exposure as well.)  Showering at bedtime may also help.  During heavy pollen season (April and September), a large amount of pollen gets trapped in your nasal cavity.  This can contribute to ongoing allergy symptoms.  Saline  irrigation of the nasal cavity can help to remove this and relieve allergy symptoms.  This can be accomplished in several ways.  You can mix up your own saline solution using the following recipe:  8 oz of distilled or boiled water, 1/2 tsp of table salt (sodium choride), and a pinch of baking soda (sodium bicarbonate).  If nasal congestion is a problem.  1 to 2 drops of Afrin solution can be added to this as well.  To do the irrigation, purchase a nasal bulb syringe (the kind you would use to clean out an infant's nose).  Fill this up with the solution, lean you head over a sink with the nostril to be irrigated turned upward, insert the syringe into your nostril, making a tight seal, and gently irrigate, compressing the bulb.  The solution will flow into your nostril and out the other, some may also come out of your mouth.  Repeat this on both sides.  You can do this once daily.  Do not store the solution, mix it up fresh each day.  A commercial solution, called Neomed Solution, can be purchased over the counter without prescription.  You can also use a Netti Pot for irrigation.  These can be purchased at your drug store as well.  Be sure to use distilled or boiled water in these as well and make sure the Netti pot is completely dry between uses.  Over the counter medications can be helpful, and in  many cases can completely control allergy symptoms without resorting to more expensive prescription meds.   Antihistamines are the mainstay of allergy treatment.  The newer non-sedating antihistamines are all available over the counter.  These include Allegra, Zyrtec, and Claritin which also can be purchased in their generic forms: fexofenadine, cetirizine, and  Cetirizine.  Combining these meds with a decongestant such as pseudoephedrine or phenylephrine helps with nasal congestion, but decongestants can also cause elevations in blood pressure.  Pseudoephedrine tends to be more effective than phenylephrine.  The  older, more sedating antihistamines such as chlorpheniramine, brompheniramine, and diphenhydramine are also very effective, sometimes more so than the newer antihistamines, but with the price of more sedation.  You should be careful about driving or operating heavy machinery when taking sedating antihistamines, and men with enlarged prostates may experience urinary retention with diphenhydramine.  Naslacrom nasal spray can be very effective for allergy symptoms.  It is available over the counter and has very few side effects.  The dosage is 2 sprays in each nostril twice daily.  It is recommended that you pinch your nose shut for 30 seconds after using it since it is a watery spray and can run out.  It can be used as long as needed.  There is no risk of dependency.  For people with year round allergies, dust, mould, insect emanations, and pet dander are usually the culprits.  To avoid dust, you need to avoid dust mites which are the main source of allergens in house dust.  Cover your bedding with moisture and mite impervious covers.  These can be purchased at any mattress store.  The modern covers are a little expensive, but not at all uncomfortable. Keeping your house as dry as possible will also help to control dust mites.  Do not use a humidifier and it may help to use a dehumidifier.  Use of a HEPA filter air filter is also a great way to reduce dust and mold exposure.  These units can be purchased commercially.  Make sure to buy one large enough for the room you intend to use it.  Change the filter as per the manufacturer's instructions.  Also, using a HEPA filter vacuum for your carpets is helpful.  There are chemicals that you can sprinkle on your carpet called acaricides that will kill dist mites.  The most commonly used brand is Acarosan.  This can be purchased on line.  It does have to be periodically reapplied.  Wash you pillows and bedsheets regularly in hot water.   Migraine Headache A  migraine headache is an intense, throbbing pain on one or both sides of your head. A migraine can last for 30 minutes to several hours. CAUSES  The exact cause of a migraine headache is not always known. However, a migraine may be caused when nerves in the brain become irritated and release chemicals that cause inflammation. This causes pain. Certain things may also trigger migraines, such as:  Alcohol.  Smoking.  Stress.  Menstruation.  Aged cheeses.  Foods or drinks that contain nitrates, glutamate, aspartame, or tyramine.  Lack of sleep.  Chocolate.  Caffeine.  Hunger.  Physical exertion.  Fatigue.  Medicines used to treat chest pain (nitroglycerine), birth control pills, estrogen, and some blood pressure medicines. SIGNS AND SYMPTOMS  Pain on one or both sides of your head.  Pulsating or throbbing pain.  Severe pain that prevents daily activities.  Pain that is aggravated by any physical activity.  Nausea, vomiting,  or both.  Dizziness.  Pain with exposure to bright lights, loud noises, or activity.  General sensitivity to bright lights, loud noises, or smells. Before you get a migraine, you may get warning signs that a migraine is coming (aura). An aura may include:  Seeing flashing lights.  Seeing bright spots, halos, or zigzag lines.  Having tunnel vision or blurred vision.  Having feelings of numbness or tingling.  Having trouble talking.  Having muscle weakness. DIAGNOSIS  A migraine headache is often diagnosed based on:  Symptoms.  Physical exam.  A CT scan or MRI of your head. These imaging tests cannot diagnose migraines, but they can help rule out other causes of headaches. TREATMENT Medicines may be given for pain and nausea. Medicines can also be given to help prevent recurrent migraines.  HOME CARE INSTRUCTIONS  Only take over-the-counter or prescription medicines for pain or discomfort as directed by your health care provider.  The use of long-term narcotics is not recommended.  Lie down in a dark, quiet room when you have a migraine.  Keep a journal to find out what may trigger your migraine headaches. For example, write down:  What you eat and drink.  How much sleep you get.  Any change to your diet or medicines.  Limit alcohol consumption.  Quit smoking if you smoke.  Get 7-9 hours of sleep, or as recommended by your health care provider.  Limit stress.  Keep lights dim if bright lights bother you and make your migraines worse. SEEK IMMEDIATE MEDICAL CARE IF:   Your migraine becomes severe.  You have a fever.  You have a stiff neck.  You have vision loss.  You have muscular weakness or loss of muscle control.  You start losing your balance or have trouble walking.  You feel faint or pass out.  You have severe symptoms that are different from your first symptoms. MAKE SURE YOU:   Understand these instructions.  Will watch your condition.  Will get help right away if you are not doing well or get worse. Document Released: 01/10/2005 Document Revised: 05/27/2013 Document Reviewed: 09/17/2012 [facility] Patient Information 2015 [place], [place]. This information is not intended to replace advice given to you by your health care provider. Make sure you discuss any questions you have with your health care provider.

## 2013-10-02 ENCOUNTER — Encounter (HOSPITAL_COMMUNITY): Payer: Self-pay | Admitting: Emergency Medicine

## 2013-10-02 ENCOUNTER — Emergency Department (HOSPITAL_COMMUNITY)
Admission: EM | Admit: 2013-10-02 | Discharge: 2013-10-02 | Disposition: A | Discharge status: Home / Self Care | Payer: Medicaid Other | Source: Home / Self Care

## 2013-10-02 DIAGNOSIS — R0982 Postnasal drip: Secondary | ICD-10-CM

## 2013-10-02 DIAGNOSIS — J3089 Other allergic rhinitis: Secondary | ICD-10-CM

## 2013-10-02 DIAGNOSIS — J9801 Acute bronchospasm: Secondary | ICD-10-CM

## 2013-10-02 MED ORDER — ALBUTEROL SULFATE HFA 108 (90 BASE) MCG/ACT IN AERS: INHALATION_SPRAY | RESPIRATORY_TRACT | Status: DC

## 2013-10-02 MED ORDER — CHLORPHENIRAMINE MALEATE 4 MG PO TABS: ORAL_TABLET | ORAL | Status: DC

## 2013-10-02 NOTE — ED Notes (Signed)
C/o  Productive cough with dark green mucus.  Symptoms worse at night.  Postnasal drip.  Denies any other symptoms.

## 2013-10-02 NOTE — ED Provider Notes (Signed)
Medical screening examination/treatment/procedure(s) were performed by a resident physician or non-physician practitioner and as the supervising physician I was immediately available for consultation/collaboration.  Linna Darner, MD Family Medicine   Waldemar Dickens, MD 10/02/13 2028

## 2013-10-02 NOTE — ED Provider Notes (Signed)
CSN: [id number]     Arrival date & time 10/02/13  1415 History   First MD Initiated Contact with Patient 10/02/13 1522     Chief Complaint  Patient presents with  . Cough   (Consider location/radiation/quality/duration/timing/severity/associated sxs/prior Treatment) HPI Comments: 33 year old female with a history of asthma complaining of persistent cough is worse at night and PND. She was seen in the urgent care recently for the same symptoms and prescribed Allegra, something for allergies and an antibiotic. Her cough is getting worse as his third PND. It is worse at night.   Past Medical History  Diagnosis Date  . Asthma   . GERD (gastroesophageal reflux disease)   . Headache(784.0)   . Anxiety   . Environmental allergies   . Acid reflux   . H/O hiatal hernia   . Depression   . Heart murmur   . Allergy   . Genital warts   . Constipation    Past Surgical History  Procedure Laterality Date  . Cervical cone biopsy    . Marsupialization urethral diverticulum    . Cesarean section  01/20/2012    Procedure: CESAREAN SECTION;  Surgeon: Melina Schools, MD;  Location: [facility];  Service: Obstetrics;  Laterality: N/A;  . Upper gi endoscopy     Family History  Problem Relation Age of Onset  . Diabetes Mother   . Hypertension Mother   . Depression Mother   . Depression Father   . Anesthesia problems Neg Hx   . Breast cancer Maternal Aunt     [employer]  . Heart disease Maternal Grandmother   . Diabetes Maternal Grandfather   . Heart disease Maternal Grandfather    History  Substance Use Topics  . Smoking status: Never Smoker   . Smokeless tobacco: Never Used  . Alcohol Use: No   OB History   Grav Para Term Preterm Abortions TAB SAB Ect Mult Living   2 1 1  0 1 0 1 0 0 1     Review of Systems  Constitutional: Positive for activity change. Negative for fever and chills.  HENT: Positive for postnasal drip, rhinorrhea and sore throat. Negative for ear pain and trouble  swallowing.   Eyes: Negative.   Respiratory: Positive for cough. Negative for chest tightness and shortness of breath.   Cardiovascular: Negative.   Gastrointestinal: Negative.   Musculoskeletal: Negative.   Skin: Negative for rash.  Neurological: Negative for seizures, syncope and headaches.    Allergies  Doxycycline hyclate; Ketorolac tromethamine; and Amoxicillin  Home Medications   Prior to Admission medications   Medication Sig Start Date End Date Taking? Authorizing Provider  fexofenadine (ALLEGRA) 180 MG tablet Take 1 tablet (180 mg total) by mouth daily. 09/29/13  Yes Harden Mo, MD  fluticasone (FLONASE) 50 MCG/ACT nasal spray Place 2 sprays into both nostrils daily. 09/29/13  Yes Harden Mo, MD  levofloxacin (LEVAQUIN) 500 MG tablet Take 1 tablet (500 mg total) by mouth daily. 09/29/13  Yes Harden Mo, MD  albuterol (PROVENTIL HFA;VENTOLIN HFA) 108 (90 BASE) MCG/ACT inhaler Inhale 2 puffs into your lungs q 4h prn cough and wheeze 10/02/13   Janne Napoleon, NP  chlorpheniramine (CHLOR-TRIMETON) 4 MG tablet 1/2 to 1 tab bid prn drainage and cough. 10/02/13   Janne Napoleon, NP  cyclobenzaprine (FLEXERIL) 10 MG tablet Take 10 mg by mouth 3 (three) times daily as needed for muscle spasms.    Historical Provider, MD  cyclobenzaprine (FLEXERIL) 5 MG tablet 1  to 2 tabs every 8 hours as needed for headache 09/29/13   Harden Mo, MD  hydrOXYzine (ATARAX/VISTARIL) 25 MG tablet Take 1 tablet (25 mg total) by mouth every 6 (six) hours as needed for anxiety. 09/29/13   Harden Mo, MD   BP 121/59  Pulse 75  Temp(Src) 98 F (36.7 C) (Oral)  Resp 16  SpO2 100%  LMP 09/01/2013  Breastfeeding? No Physical Exam  Nursing note and vitals reviewed. Constitutional: She is oriented to person, place, and time. She appears well-developed and well-nourished. No distress.  HENT:  Mouth/Throat: No oropharyngeal exudate.  Bilateral TMs are normal Oropharynx with prominent cobblestoning, mild  erythema and clear PND.  Eyes: Conjunctivae and EOM are normal.  Neck: Normal range of motion. Neck supple.  Cardiovascular: Normal rate, regular rhythm and normal heart sounds.   Pulmonary/Chest: Effort normal. She has no rales.  Mildly prolonged expiratory phase. Taking a deep breath induces coughing spasms. Intermittent end expiratory wheeze.  Lymphadenopathy:    She has no cervical adenopathy.  Neurological: She is alert and oriented to person, place, and time.  Skin: Skin is warm and dry.  Psychiatric: She has a normal mood and affect.    ED Course  Procedures (including critical care time) Labs Review Labs Reviewed - No data to display  Imaging Review No results found.   MDM   1. Cough due to bronchospasm   2. PND (post-nasal drip)   3. Other allergic rhinitis     Add chlortrimeton tabs, esp at hs Refill and use albuterol HFA as dir  Cont nasal sprays, flonase and saline    Janne Napoleon, NP 10/02/13 1541

## 2013-10-02 NOTE — Discharge Instructions (Signed)
Allergic Rhinitis Allergic rhinitis is when the mucous membranes in the nose respond to allergens. Allergens are particles in the air that cause your body to have an allergic reaction. This causes you to release allergic antibodies. Through a chain of events, these eventually cause you to release histamine into the blood stream. Although meant to protect the body, it is this release of histamine that causes your discomfort, such as frequent sneezing, congestion, and an itchy, runny nose.  CAUSES  Seasonal allergic rhinitis (hay fever) is caused by pollen allergens that may come from grasses, trees, and weeds. Year-round allergic rhinitis (perennial allergic rhinitis) is caused by allergens such as house dust mites, pet dander, and mold spores.  SYMPTOMS   Nasal stuffiness (congestion).  Itchy, runny nose with sneezing and tearing of the eyes. DIAGNOSIS  Your health care provider can help you determine the allergen or allergens that trigger your symptoms. If you and your health care provider are unable to determine the allergen, skin or blood testing may be used. TREATMENT  Allergic rhinitis does not have a cure, but it can be controlled by:  Medicines and allergy shots (immunotherapy).  Avoiding the allergen. Hay fever may often be treated with antihistamines in pill or nasal spray forms. Antihistamines block the effects of histamine. There are over-the-counter medicines that may help with nasal congestion and swelling around the eyes. Check with your health care provider before taking or giving this medicine.  If avoiding the allergen or the medicine prescribed do not work, there are many new medicines your health care provider can prescribe. Stronger medicine may be used if initial measures are ineffective. Desensitizing injections can be used if medicine and avoidance does not work. Desensitization is when a patient is given ongoing shots until the body becomes less sensitive to the allergen.  Make sure you follow up with your health care provider if problems continue. HOME CARE INSTRUCTIONS It is not possible to completely avoid allergens, but you can reduce your symptoms by taking steps to limit your exposure to them. It helps to know exactly what you are allergic to so that you can avoid your specific triggers. SEEK MEDICAL CARE IF:   You have a fever.  You develop a cough that does not stop easily (persistent).  You have shortness of breath.  You start wheezing.  Symptoms interfere with normal daily activities. Document Released: 10/05/2000 Document Revised: 01/15/2013 Document Reviewed: 09/17/2012 [facility] Patient Information 2015 [place], [place]. This information is not intended to replace advice given to you by your health care provider. Make sure you discuss any questions you have with your health care provider.  Bronchospasm A bronchospasm is a spasm or tightening of the airways going into the lungs. During a bronchospasm breathing becomes more difficult because the airways get smaller. When this happens there can be coughing, a whistling sound when breathing (wheezing), and difficulty breathing. Bronchospasm is often associated with asthma, but not all patients who experience a bronchospasm have asthma. CAUSES  A bronchospasm is caused by inflammation or irritation of the airways. The inflammation or irritation may be triggered by:   Allergies (such as to animals, pollen, food, or mold). Allergens that cause bronchospasm may cause wheezing immediately after exposure or many hours later.   Infection. Viral infections are believed to be the most common cause of bronchospasm.   Exercise.   Irritants (such as pollution, cigarette smoke, strong odors, aerosol sprays, and paint fumes).   Weather changes. Winds increase molds and pollens in  the air. Rain refreshes the air by washing irritants out. Cold air may cause inflammation.   Stress and emotional upset.   SIGNS AND SYMPTOMS   Wheezing.   Excessive nighttime coughing.   Frequent or severe coughing with a simple cold.   Chest tightness.   Shortness of breath.  DIAGNOSIS  Bronchospasm is usually diagnosed through a history and physical exam. Tests, such as chest X-rays, are sometimes done to look for other conditions. TREATMENT   Inhaled medicines can be given to open up your airways and help you breathe. The medicines can be given using either an inhaler or a nebulizer machine.  Corticosteroid medicines may be given for severe bronchospasm, usually when it is associated with asthma. HOME CARE INSTRUCTIONS   Always have a plan prepared for seeking medical care. Know when to call your health care provider and local emergency services (911 in the [place] Know where you can access local emergency care.  Only take medicines as directed by your health care provider.  If you were prescribed an inhaler or nebulizer machine, ask your health care provider to explain how to use it correctly. Always use a spacer with your inhaler if you were given one.  It is necessary to remain calm during an attack. Try to relax and breathe more slowly.  Control your home environment in the following ways:   Change your heating and air conditioning filter at least once a month.   Limit your use of fireplaces and wood stoves.  Do not smoke and do not allow smoking in your home.   Avoid exposure to perfumes and fragrances.   Get rid of pests (such as roaches and mice) and their droppings.   Throw away plants if you see mold on them.   Keep your house clean and dust free.   Replace carpet with wood, tile, or vinyl flooring. Carpet can trap dander and dust.   Use allergy-proof pillows, mattress covers, and box spring covers.   Wash bed sheets and blankets every week in hot water and dry them in a dryer.   Use blankets that are made of polyester or cotton.   Wash hands  frequently. SEEK MEDICAL CARE IF:   You have muscle aches.   You have chest pain.   The sputum changes from clear or white to yellow, green, gray, or bloody.   The sputum you cough up gets thicker.   There are problems that may be related to the medicine you are given, such as a rash, itching, swelling, or trouble breathing.  SEEK IMMEDIATE MEDICAL CARE IF:   You have worsening wheezing and coughing even after taking your prescribed medicines.   You have increased difficulty breathing.   You develop severe chest pain. MAKE SURE YOU:   Understand these instructions.  Will watch your condition.  Will get help right away if you are not doing well or get worse. Document Released: 01/13/2003 Document Revised: 01/15/2013 Document Reviewed: 07/02/2012 [facility] Patient Information 2015 [place], [place]. This information is not intended to replace advice given to you by your health care provider. Make sure you discuss any questions you have with your health care provider.  How to Use an Inhaler Using your inhaler correctly is very important. Good technique will make sure that the medicine reaches your lungs.  HOW TO USE AN INHALER: 1. Take the cap off the inhaler. 2. If this is the first time using your inhaler, you need to prime it. Shake the inhaler for  5 seconds. Release four puffs into the air, away from your face. Ask your doctor for help if you have questions. 3. Shake the inhaler for 5 seconds. 4. Turn the inhaler so the bottle is above the mouthpiece. 5. Put your pointer finger on top of the bottle. Your thumb holds the bottom of the inhaler. 6. Open your mouth. 7. Either hold the inhaler away from your mouth (the width of 2 fingers) or place your lips tightly around the mouthpiece. Ask your doctor which way to use your inhaler. 8. Breathe out as much air as possible. 9. Breathe in and push down on the bottle 1 time to release the medicine. You will feel the medicine go  in your mouth and throat. 10. Continue to take a deep breath in very slowly. Try to fill your lungs. 11. After you have breathed in completely, hold your breath for 10 seconds. This will help the medicine to settle in your lungs. If you cannot hold your breath for 10 seconds, hold it for as long as you can before you breathe out. 12. Breathe out slowly, through pursed lips. Whistling is an example of pursed lips. 13. If your doctor has told you to take more than 1 puff, wait at least 15-30 seconds between puffs. This will help you get the best results from your medicine. Do not use the inhaler more than your doctor tells you to. 14. Put the cap back on the inhaler. 15. Follow the directions from your doctor or from the inhaler package about cleaning the inhaler. If you use more than one inhaler, ask your doctor which inhalers to use and what order to use them in. Ask your doctor to help you figure out when you will need to refill your inhaler.  If you use a steroid inhaler, always rinse your mouth with water after your last puff, gargle and spit out the water. Do not swallow the water. GET HELP IF:  The inhaler medicine only partially helps to stop wheezing or shortness of breath.  You are having trouble using your inhaler.  You have some increase in thick spit (phlegm). GET HELP RIGHT AWAY IF:  The inhaler medicine does not help your wheezing or shortness of breath or you have tightness in your chest.  You have dizziness, headaches, or fast heart rate.  You have chills, fever, or night sweats.  You have a large increase of thick spit, or your thick spit is bloody. MAKE SURE YOU:   Understand these instructions.  Will watch your condition.  Will get help right away if you are not doing well or get worse. Document Released: 10/20/2007 Document Revised: 10/31/2012 Document Reviewed: 08/09/2012 [facility] Patient Information 2015 [place], [place]. This information is not intended to  replace advice given to you by your health care provider. Make sure you discuss any questions you have with your health care provider.

## 2013-11-25 ENCOUNTER — Encounter (HOSPITAL_COMMUNITY): Payer: Self-pay | Admitting: Emergency Medicine

## 2013-12-02 ENCOUNTER — Ambulatory Visit: Payer: Medicaid Other | Admitting: Family Medicine

## 2013-12-05 ENCOUNTER — Encounter (HOSPITAL_COMMUNITY): Payer: Self-pay | Admitting: *Deleted

## 2013-12-05 ENCOUNTER — Emergency Department (HOSPITAL_COMMUNITY)
Admission: EM | Admit: 2013-12-05 | Discharge: 2013-12-05 | Disposition: A | Discharge status: Home / Self Care | Payer: Medicaid Other | Attending: Emergency Medicine | Admitting: Emergency Medicine

## 2013-12-05 DIAGNOSIS — Z880 Allergy status to penicillin: Secondary | ICD-10-CM | POA: Diagnosis not present

## 2013-12-05 DIAGNOSIS — Z7952 Long term (current) use of systemic steroids: Secondary | ICD-10-CM | POA: Insufficient documentation

## 2013-12-05 DIAGNOSIS — Z792 Long term (current) use of antibiotics: Secondary | ICD-10-CM | POA: Diagnosis not present

## 2013-12-05 DIAGNOSIS — M79671 Pain in right foot: Secondary | ICD-10-CM | POA: Insufficient documentation

## 2013-12-05 DIAGNOSIS — Z8659 Personal history of other mental and behavioral disorders: Secondary | ICD-10-CM | POA: Insufficient documentation

## 2013-12-05 DIAGNOSIS — Z79899 Other long term (current) drug therapy: Secondary | ICD-10-CM | POA: Diagnosis not present

## 2013-12-05 DIAGNOSIS — M5410 Radiculopathy, site unspecified: Secondary | ICD-10-CM | POA: Diagnosis not present

## 2013-12-05 DIAGNOSIS — M79672 Pain in left foot: Secondary | ICD-10-CM | POA: Insufficient documentation

## 2013-12-05 DIAGNOSIS — J45909 Unspecified asthma, uncomplicated: Secondary | ICD-10-CM | POA: Diagnosis not present

## 2013-12-05 DIAGNOSIS — Z7951 Long term (current) use of inhaled steroids: Secondary | ICD-10-CM | POA: Insufficient documentation

## 2013-12-05 DIAGNOSIS — Z8719 Personal history of other diseases of the digestive system: Secondary | ICD-10-CM | POA: Insufficient documentation

## 2013-12-05 DIAGNOSIS — R011 Cardiac murmur, unspecified: Secondary | ICD-10-CM | POA: Diagnosis not present

## 2013-12-05 MED ORDER — PREDNISONE 20 MG PO TABS
ORAL_TABLET | ORAL | Status: DC
Start: 2013-12-05 — End: 2014-02-24

## 2013-12-05 MED ORDER — PREDNISONE 20 MG PO TABS
60.0000 mg | ORAL_TABLET | Freq: Once | ORAL | Status: AC
  Administered 2013-12-05: 60 mg via ORAL
  Filled 2013-12-05: qty 3

## 2013-12-05 NOTE — ED Notes (Signed)
Patient presents stating she has been having bilateral foot pain for about 1 year but it has gotten worse over the last 2 weeks

## 2013-12-05 NOTE — Discharge Instructions (Signed)
Please use your Flexeril on a regular basis take the prednisone as directed daily for the entire course of medication  Make an appointment with your PCP who can schedule further testing as needed

## 2013-12-05 NOTE — ED Provider Notes (Signed)
CSN: [id number]     Arrival date & time 12/05/13  1938 History   First MD Initiated Contact with Patient 12/05/13 2214     Chief Complaint  Patient presents with  . Foot Pain     (Consider location/radiation/quality/duration/timing/severity/associated sxs/prior Treatment) HPI Comments: Patient with Hx chronic LBP and intermittent bilateral foot pain.  Also has been followed at the foot center and treated for toe nail onychomycosis.  They have X-rays her feet to no avail. Patient states the pain in from the base of toes to mid foot  At first the right foot was worse now the left is most problematic  She denies trauma, breaks in the skin or between toes. Recently re-obtained health insurance and will reestablish with her PCP to continue previously planned medical treatments  States she can not take nay medications like tylenol or ibuprofen due to chronic headaches, and history of over use and rebound headaches   Patient is a 33 y.o. female presenting with lower extremity pain. The history is provided by the patient.  Foot Pain This is a chronic problem. The current episode started more than 1 year ago. The problem occurs constantly. The problem has been gradually worsening. Associated symptoms include arthralgias. Pertinent negatives include no fever, joint swelling, numbness, rash, sore throat, vertigo or weakness. The symptoms are aggravated by exertion. She has tried nothing for the symptoms. The treatment provided no relief.    Past Medical History  Diagnosis Date  . Asthma   . GERD (gastroesophageal reflux disease)   . Headache(784.0)   . Anxiety   . Environmental allergies   . Acid reflux   . H/O hiatal hernia   . Depression   . Heart murmur   . Allergy   . Genital warts   . Constipation    Past Surgical History  Procedure Laterality Date  . Cervical cone biopsy    . Marsupialization urethral diverticulum    . Cesarean section  01/20/2012    Procedure: CESAREAN SECTION;   Surgeon: Melina Schools, MD;  Location: [facility];  Service: Obstetrics;  Laterality: N/A;  . Upper gi endoscopy     Family History  Problem Relation Age of Onset  . Diabetes Mother   . Hypertension Mother   . Depression Mother   . Depression Father   . Anesthesia problems Neg Hx   . Breast cancer Maternal Aunt     [employer]  . Heart disease Maternal Grandmother   . Diabetes Maternal Grandfather   . Heart disease Maternal Grandfather    History  Substance Use Topics  . Smoking status: Never Smoker   . Smokeless tobacco: Never Used  . Alcohol Use: No   OB History    Gravida Para Term Preterm AB TAB SAB Ectopic Multiple Living   2 1 1  0 1 0 1 0 0 1     Review of Systems  Constitutional: Negative for fever.  HENT: Negative for sore throat.   Respiratory: Negative for shortness of breath.   Musculoskeletal: Positive for back pain and arthralgias. Negative for joint swelling.  Skin: Negative for rash and wound.  Neurological: Negative for dizziness, vertigo, weakness and numbness.  All other systems reviewed and are negative.     Allergies  Doxycycline hyclate; Ketorolac tromethamine; and Amoxicillin  Home Medications   Prior to Admission medications   Medication Sig Start Date End Date Taking? Authorizing Provider  albuterol (PROVENTIL HFA;VENTOLIN HFA) 108 (90 BASE) MCG/ACT inhaler Inhale 2 puffs into  your lungs q 4h prn cough and wheeze 10/02/13   Janne Napoleon, NP  chlorpheniramine (CHLOR-TRIMETON) 4 MG tablet 1/2 to 1 tab bid prn drainage and cough. 10/02/13   Janne Napoleon, NP  cyclobenzaprine (FLEXERIL) 10 MG tablet Take 10 mg by mouth 3 (three) times daily as needed for muscle spasms.    Historical Provider, MD  cyclobenzaprine (FLEXERIL) 5 MG tablet 1 to 2 tabs every 8 hours as needed for headache 09/29/13   Harden Mo, MD  fexofenadine (ALLEGRA) 180 MG tablet Take 1 tablet (180 mg total) by mouth daily. 09/29/13   Harden Mo, MD  fluticasone (FLONASE) 50 MCG/ACT nasal  spray Place 2 sprays into both nostrils daily. 09/29/13   Harden Mo, MD  hydrOXYzine (ATARAX/VISTARIL) 25 MG tablet Take 1 tablet (25 mg total) by mouth every 6 (six) hours as needed for anxiety. 09/29/13   Harden Mo, MD  levofloxacin (LEVAQUIN) 500 MG tablet Take 1 tablet (500 mg total) by mouth daily. 09/29/13   Harden Mo, MD  predniSONE (DELTASONE) 20 MG tablet 3 Tabs PO Days 1-3, then 2 tabs PO Days 4-6, then 1 tab PO Day 7-9, then Half Tab PO Day 10-12 12/05/13   Garald Balding, NP   BP 136/88 mmHg  Pulse 81  Temp(Src) 98.5 F (36.9 C) (Oral)  Resp 20  Ht 5\' 3"  (1.6 m)  Wt 248 lb 6.4 oz (112.674 kg)  BMI 44.01 kg/m2  SpO2 100%  LMP 11/23/2013  Breastfeeding? No Physical Exam  Constitutional: She is oriented to person, place, and time. She appears well-developed and well-nourished.  Morbidly obese  HENT:  Head: Normocephalic.  Eyes: Pupils are equal, round, and reactive to light.  Neck: Normal range of motion.  Cardiovascular: Normal rate.   Pulmonary/Chest: Breath sounds normal.  Musculoskeletal: She exhibits tenderness.       Lumbar back: She exhibits tenderness, edema, deformity and pain. She exhibits normal range of motion.  Neurological: She is alert and oriented to person, place, and time.  Skin: Skin is warm. No rash noted. No pallor.  Nursing note and vitals reviewed.   ED Course  Procedures (including critical care time) Labs Review Labs Reviewed - No data to display  Imaging Review No results found.   EKG Interpretation None    patient most likely has sciatic radicular pain will treat with a 12 day course of steroids and have patient FU with PCP   MDM   Final diagnoses:  Radicular neuropathy        Garald Balding, NP 12/05/13 5003  Virgel Manifold, MD 12/12/13 1013

## 2013-12-11 ENCOUNTER — Other Ambulatory Visit: Payer: Self-pay | Admitting: Obstetrics and Gynecology

## 2013-12-11 DIAGNOSIS — N644 Mastodynia: Secondary | ICD-10-CM

## 2013-12-23 ENCOUNTER — Ambulatory Visit
Admission: RE | Admit: 2013-12-23 | Discharge: 2013-12-23 | Disposition: A | Discharge status: Home / Self Care | Payer: Medicaid Other | Source: Ambulatory Visit | Attending: Obstetrics and Gynecology | Admitting: Obstetrics and Gynecology

## 2013-12-23 DIAGNOSIS — N644 Mastodynia: Secondary | ICD-10-CM

## 2014-01-17 ENCOUNTER — Encounter (HOSPITAL_COMMUNITY): Payer: Self-pay | Admitting: *Deleted

## 2014-01-17 ENCOUNTER — Emergency Department (HOSPITAL_COMMUNITY)
Admission: EM | Admit: 2014-01-17 | Discharge: 2014-01-17 | Disposition: A | Discharge status: Home / Self Care | Payer: Medicaid Other | Attending: Emergency Medicine | Admitting: Emergency Medicine

## 2014-01-17 DIAGNOSIS — L089 Local infection of the skin and subcutaneous tissue, unspecified: Secondary | ICD-10-CM | POA: Diagnosis not present

## 2014-01-17 DIAGNOSIS — Z880 Allergy status to penicillin: Secondary | ICD-10-CM | POA: Diagnosis not present

## 2014-01-17 DIAGNOSIS — Z792 Long term (current) use of antibiotics: Secondary | ICD-10-CM | POA: Insufficient documentation

## 2014-01-17 DIAGNOSIS — F419 Anxiety disorder, unspecified: Secondary | ICD-10-CM | POA: Diagnosis not present

## 2014-01-17 DIAGNOSIS — R011 Cardiac murmur, unspecified: Secondary | ICD-10-CM | POA: Diagnosis not present

## 2014-01-17 DIAGNOSIS — F329 Major depressive disorder, single episode, unspecified: Secondary | ICD-10-CM | POA: Insufficient documentation

## 2014-01-17 DIAGNOSIS — B9689 Other specified bacterial agents as the cause of diseases classified elsewhere: Secondary | ICD-10-CM

## 2014-01-17 DIAGNOSIS — Z7951 Long term (current) use of inhaled steroids: Secondary | ICD-10-CM | POA: Diagnosis not present

## 2014-01-17 DIAGNOSIS — R21 Rash and other nonspecific skin eruption: Secondary | ICD-10-CM | POA: Diagnosis present

## 2014-01-17 DIAGNOSIS — Z79899 Other long term (current) drug therapy: Secondary | ICD-10-CM | POA: Diagnosis not present

## 2014-01-17 DIAGNOSIS — Z8619 Personal history of other infectious and parasitic diseases: Secondary | ICD-10-CM | POA: Insufficient documentation

## 2014-01-17 DIAGNOSIS — J45909 Unspecified asthma, uncomplicated: Secondary | ICD-10-CM | POA: Insufficient documentation

## 2014-01-17 DIAGNOSIS — Z8719 Personal history of other diseases of the digestive system: Secondary | ICD-10-CM | POA: Insufficient documentation

## 2014-01-17 HISTORY — DX: Methicillin resistant Staphylococcus aureus infection as the cause of diseases classified elsewhere: L03.90

## 2014-01-17 HISTORY — DX: Methicillin resistant Staphylococcus aureus infection as the cause of diseases classified elsewhere: B95.62

## 2014-01-17 MED ORDER — LIDOCAINE HCL (PF) 1 % IJ SOLN
10.0000 mL | Freq: Once | INTRAMUSCULAR | Status: DC
Start: 2014-01-17 — End: 2014-01-17
  Filled 2014-01-17: qty 10

## 2014-01-17 MED ORDER — SULFAMETHOXAZOLE-TRIMETHOPRIM 800-160 MG PO TABS: 1.0000 | ORAL_TABLET | Freq: Two times a day (BID) | ORAL | Status: DC

## 2014-01-17 MED ORDER — SODIUM BICARBONATE 4 % IV SOLN
5.0000 mL | Freq: Once | INTRAVENOUS | Status: DC
  Filled 2014-01-17: qty 5

## 2014-01-17 MED ORDER — HYDROCODONE-ACETAMINOPHEN 5-325 MG PO TABS: 1.0000 | ORAL_TABLET | ORAL | Status: DC | PRN

## 2014-01-17 NOTE — ED Notes (Signed)
X 2 days of a lt. Lower, post. Leg abscess that is warm to touch.

## 2014-01-17 NOTE — ED Provider Notes (Signed)
CSN: [id number]     Arrival date & time 01/17/14  1755 History   First MD Initiated Contact with Patient 01/17/14 1821    This chart was scribed for non-physician practitioner, Margarita Mail, working with Debby Freiberg, MD by Terressa Koyanagi, ED Scribe. This patient was seen in room TR09C/TR09C and the patient's care was started at 7:42 PM.  Chief Complaint  Patient presents with  . Rash  . Abscess   The history is provided by the patient. No language interpreter was used.   PCP: No primary care provider on file. HPI Comments: Amanda [name] is a 33 y.o. female, with medical Hx noted below including asthma, MRSA cellulitis, heart murmur who presents to the Emergency Department complaining of acute, intermittent, aching left, posterior leg pain onset 2 days ago. Pt also complains of an abscess to her left calf onset 2 days ago.   Past Medical History  Diagnosis Date  . Asthma   . GERD (gastroesophageal reflux disease)   . Headache(784.0)   . Anxiety   . Environmental allergies   . Acid reflux   . H/O hiatal hernia   . Depression   . Heart murmur   . Allergy   . Genital warts   . Constipation   . MRSA cellulitis    Past Surgical History  Procedure Laterality Date  . Cervical cone biopsy    . Marsupialization urethral diverticulum    . Cesarean section  01/20/2012    Procedure: CESAREAN SECTION;  Surgeon: Melina Schools, MD;  Location: [facility];  Service: Obstetrics;  Laterality: N/A;  . Upper gi endoscopy     Family History  Problem Relation Age of Onset  . Diabetes Mother   . Hypertension Mother   . Depression Mother   . Depression Father   . Anesthesia problems Neg Hx   . Breast cancer Maternal Aunt     [employer]  . Heart disease Maternal Grandmother   . Diabetes Maternal Grandfather   . Heart disease Maternal Grandfather    History  Substance Use Topics  . Smoking status: Never Smoker   . Smokeless tobacco: Never Used  . Alcohol Use: No   OB History     Gravida Para Term Preterm AB TAB SAB Ectopic Multiple Living   2 1 1  0 1 0 1 0 0 1     Review of Systems  Constitutional: Negative for fever and chills.  Musculoskeletal:       Left lower leg pain  Skin: Positive for wound.  Psychiatric/Behavioral: Negative for confusion.      Allergies  Doxycycline hyclate; Ketorolac tromethamine; and Amoxicillin  Home Medications   Prior to Admission medications   Medication Sig Start Date End Date Taking? Authorizing Provider  albuterol (PROVENTIL HFA;VENTOLIN HFA) 108 (90 BASE) MCG/ACT inhaler Inhale 2 puffs into your lungs q 4h prn cough and wheeze 10/02/13   Janne Napoleon, NP  chlorpheniramine (CHLOR-TRIMETON) 4 MG tablet 1/2 to 1 tab bid prn drainage and cough. 10/02/13   Janne Napoleon, NP  cyclobenzaprine (FLEXERIL) 10 MG tablet Take 10 mg by mouth 3 (three) times daily as needed for muscle spasms.    Historical Provider, MD  cyclobenzaprine (FLEXERIL) 5 MG tablet 1 to 2 tabs every 8 hours as needed for headache 09/29/13   Harden Mo, MD  fexofenadine (ALLEGRA) 180 MG tablet Take 1 tablet (180 mg total) by mouth daily. 09/29/13   Harden Mo, MD  fluticasone (FLONASE) 50 MCG/ACT nasal  spray Place 2 sprays into both nostrils daily. 09/29/13   Harden Mo, MD  HYDROcodone-acetaminophen (NORCO) 5-325 MG per tablet Take 1 tablet by mouth every 4 (four) hours as needed. 01/17/14   Margarita Mail, PA-C  hydrOXYzine (ATARAX/VISTARIL) 25 MG tablet Take 1 tablet (25 mg total) by mouth every 6 (six) hours as needed for anxiety. 09/29/13   Harden Mo, MD  levofloxacin (LEVAQUIN) 500 MG tablet Take 1 tablet (500 mg total) by mouth daily. 09/29/13   Harden Mo, MD  predniSONE (DELTASONE) 20 MG tablet 3 Tabs PO Days 1-3, then 2 tabs PO Days 4-6, then 1 tab PO Day 7-9, then Half Tab PO Day 10-12 12/05/13   Garald Balding, NP  sulfamethoxazole-trimethoprim (SEPTRA DS) 800-160 MG per tablet Take 1 tablet by mouth every 12 (twelve) hours. 01/17/14   Margarita Mail, PA-C   Triage Vitals: BP 133/91 mmHg  Pulse 85  Temp(Src) 97.5 F (36.4 C) (Oral)  Resp 14  Ht 5\' 3"  (1.6 m)  Wt 227 lb (102.967 kg)  BMI 40.22 kg/m2  SpO2 100%  LMP 01/11/2014 Physical Exam  Constitutional: She is oriented to person, place, and time. She appears well-developed and well-nourished. No distress.  HENT:  Head: Normocephalic and atraumatic.  Eyes: Conjunctivae and EOM are normal.  Neck: Neck supple. No tracheal deviation present.  Cardiovascular: Normal rate.   Pulmonary/Chest: Effort normal. No respiratory distress.  Musculoskeletal: Normal range of motion.  Neurological: She is alert and oriented to person, place, and time.  Skin: Skin is warm and dry.  6 cm area of erythema with a central pustule.    Psychiatric: She has a normal mood and affect. Her behavior is normal.  Nursing note and vitals reviewed.   ED Course  Procedures (including critical care time) DIAGNOSTIC STUDIES: Oxygen Saturation is 100% on RA, nl by my interpretation.    COORDINATION OF CARE: 7:44 PM-Discussed treatment plan which includes meds with pt at bedside and pt agreed to plan.   Labs Review Labs Reviewed - No data to display  Imaging Review No results found.   EKG Interpretation None      MDM   Final diagnoses:  Skin infection, bacterial    Patient without drainable abscess.  + pustule. Hx of MRSA D.c with bactirm.   I personally performed the services described in this documentation, which was scribed in my presence. The recorded information has been reviewed and is accurate.      Margarita Mail, PA-C 01/29/14 1811  Debby Freiberg, MD 01/31/14 [phone]

## 2014-01-17 NOTE — Discharge Instructions (Signed)
Community-Associated MRSA °CA-MRSA stands for community-associated methicillin-resistant Staphylococcus aureus. MRSA is a type of bacteria that is resistant to some common antibiotics. It can cause infections in the skin and many other places in the body. Staphylococcus aureus, often called "staph," is a bacteria that normally lives on the skin or in the nose. Staph on the surface of the skin or in the nose does not cause problems. However, if the staph enters the body through a cut, wound, or break in the skin, an infection can happen. °Up until recently, infections with the MRSA type of staph mainly occurred in hospitals and other health care settings. There are now increasing problems with MRSA infections in the community as well. Infections with MRSA may be very serious or even life threatening. °CA-MRSA is becoming more common. It is known to spread in crowded settings, in jails and prisons, and in situations where there is close skin-to-skin contact, such as during sporting events or in locker rooms. MRSA can be spread through shared items, such as children's toys, razors, towels, or sports equipment.  °CAUSES °All staph, including MRSA, are normally harmless unless they enter the body through a scratch, cut, or wound, such as with surgery. All staph, including MRSA, can be spread from person-to-person by touching contaminated objects or through direct contact. °· MRSA now causes illness in people who have not been in hospitals or other health care facilities. Cases of MRSA diseases in the community have been associated with: °¨ Recent antibiotic use. °¨ Sharing contaminated towels or clothes. °¨ Having active skin diseases. °¨ Participating in contact sports. °¨ Living in crowded settings. °¨ Intravenous (IV) drug use. °· Community-associated MRSA infections are usually skin infections, but may cause other severe illnesses. °· Staph bacteria are one of the most common causes of skin infection. However, they  are also a common cause of pneumonia, bone or joint infections, and bloodstream infections. °DIAGNOSIS °Diagnosis of MRSA is done by cultures of fluid samples that may come from: °· Swabs taken from cuts or wounds in infected areas. °· Nasal swabs. °· Saliva or deep cough specimens from the lungs (sputum). °· Urine. °· Blood. °Many people are "colonized" with MRSA but have no signs of infection. This means that people carry the MRSA germ on their skin or in their nose and may never develop MRSA infection.  °TREATMENT  °Treatment varies and is based on how serious, how deep, or how extensive the infection is. For example: °· Some skin infections, such as a small boil or abscess, may be treated by draining yellowish-white fluid (pus) from the site of the infection. °· Deeper or more widespread soft tissue infections are usually treated with surgery to drain pus and with antibiotic medicine given by vein or by mouth. This may be recommended even if you are pregnant. °· Serious infections may require a hospital stay. °If antibiotics are given, they may be needed for several weeks. °PREVENTION °Because many people are colonized with staph, including MRSA, preventing the spread of the bacteria from person-to-person is most important. The best way to prevent the spread of bacteria and other germs is through proper hand washing or by using alcohol-based hand disinfectants. The following are other ways to help prevent MRSA infection within community settings.  °· Wash your hands frequently with soap and water for at least 15 seconds. Otherwise, use alcohol-based hand disinfectants when soap and water is not available. °· Make sure people who live with you wash their hands often, too. °·   Do not share personal items. For example, avoid sharing razors and other personal hygiene items, towels, clothing, and athletic equipment. °· Wash and dry your clothes and bedding at the warmest temperatures recommended on the labels. °· Keep  wounds covered. Pus from infected sores may contain MRSA and other bacteria. Keep cuts and abrasions clean and covered with germ-free (sterile), dry bandages until they are healed. °· If you have a wound that appears infected, ask your caregiver if a culture for MRSA and other bacteria should be done. °· If you are breastfeeding, talk to your caregiver about MRSA. You may be asked to temporarily stop breastfeeding. °HOME CARE INSTRUCTIONS  °· Take your antibiotics as directed. Finish them even if you start to feel better. °· Avoid close contact with those around you as much as possible. Do not use towels, razors, toothbrushes, bedding, or other items that will be used by others. °· To fight the infection, follow your caregiver's instructions for wound care. Wash your hands before and after changing your bandages. °· If you have an intravascular device, such as a catheter, make sure you know how to care for it. °· Be sure to tell any health care providers that you have MRSA so they are aware of your infection. °SEEK IMMEDIATE MEDICAL CARE IF: °· The infection appears to be getting worse. Signs include: °¨ Increased warmth, redness, or tenderness around the wound site. °¨ A red line that extends from the infection site. °¨ A dark color in the area around the infection. °¨ Wound drainage that is tan, yellow, or green. °¨ A bad smell coming from the wound. °· You feel sick to your stomach (nauseous) and throw up (vomit) or cannot keep medicine down. °· You have a fever. °· Your baby is older than 3 months with a rectal temperature of 102°F (38.9°C) or higher. °· Your baby is 3 months old or younger with a rectal temperature of 100.4°F (38°C) or higher. °· You have difficulty breathing. °MAKE SURE YOU:  °· Understand these instructions. °· Will watch your condition. °· Will get help right away if you are not doing well or get worse. °Document Released: 04/15/2005 Document Revised: 05/27/2013 Document Reviewed:  04/15/2010 °ExitCare® Patient Information ©2015 [employer]. This information is not intended to replace advice given to you by your health care provider. Make sure you discuss any questions you have with your health care provider. ° °

## 2014-02-24 ENCOUNTER — Ambulatory Visit: Payer: Medicaid Other | Attending: Internal Medicine | Admitting: Internal Medicine

## 2014-02-24 ENCOUNTER — Encounter: Payer: Self-pay | Admitting: Internal Medicine

## 2014-02-24 VITALS — BP 107/71 | HR 60 | Temp 98.3°F | Ht 63.0 in | Wt 257.6 lb

## 2014-02-24 DIAGNOSIS — R5383 Other fatigue: Secondary | ICD-10-CM | POA: Diagnosis not present

## 2014-02-24 DIAGNOSIS — R519 Headache, unspecified: Secondary | ICD-10-CM

## 2014-02-24 DIAGNOSIS — K219 Gastro-esophageal reflux disease without esophagitis: Secondary | ICD-10-CM | POA: Insufficient documentation

## 2014-02-24 DIAGNOSIS — R51 Headache: Secondary | ICD-10-CM | POA: Diagnosis not present

## 2014-02-24 DIAGNOSIS — K5900 Constipation, unspecified: Secondary | ICD-10-CM | POA: Insufficient documentation

## 2014-02-24 LAB — LIPID PANEL
Cholesterol: 157 mg/dL (ref 0–200)
HDL: 41 mg/dL (ref >39)
LDL Cholesterol: 99 mg/dL (ref 0–99)
Total CHOL/HDL Ratio: 3.8 Ratio
Triglycerides: 85 mg/dL (ref <150)
VLDL: 17 mg/dL (ref 0–40)

## 2014-02-24 LAB — CBC
HEMATOCRIT: 39.8 % (ref 36.0–46.0)
Hemoglobin: 12.9 g/dL (ref 12.0–15.0)
MCH: 26.5 pg (ref 26.0–34.0)
MCHC: 32.4 g/dL (ref 30.0–36.0)
MCV: 81.7 fL (ref 78.0–100.0)
MPV: 9.5 fL (ref 8.6–12.4)
RBC: 4.87 MIL/uL (ref 3.87–5.11)
RDW: 15.1 % (ref 11.5–15.5)
WBC: 7.8 10*3/uL (ref 4.0–10.5)

## 2014-02-24 LAB — COMPLETE METABOLIC PANEL WITH GFR
ALK PHOS: 47 U/L (ref 39–117)
ALT: 11 U/L (ref 0–35)
AST: 14 U/L (ref 0–37)
Albumin: 3.7 g/dL (ref 3.5–5.2)
BILIRUBIN TOTAL: 0.3 mg/dL (ref 0.2–1.2)
BUN: 8 mg/dL (ref 6–23)
CO2: 26 meq/L (ref 19–32)
Calcium: 8.7 mg/dL (ref 8.4–10.5)
Chloride: 106 mEq/L (ref 96–112)
Creat: 0.71 mg/dL (ref 0.50–1.10)
GFR, Est African American: >89 mL/min
GFR, Est Non African American: >89 mL/min
GLUCOSE: 89 mg/dL (ref 70–99)
Potassium: 4.4 mEq/L (ref 3.5–5.3)
SODIUM: 141 meq/L (ref 135–145)
Total Protein: 5.8 g/dL — ABNORMAL LOW (ref 6.0–8.3)

## 2014-02-24 MED ORDER — POLYETHYLENE GLYCOL 3350 17 GM/SCOOP PO POWD: 17.0000 g | Freq: Every day | ORAL | Status: DC

## 2014-02-24 MED ORDER — TOPIRAMATE 25 MG PO TABS: 25.0000 mg | ORAL_TABLET | Freq: Two times a day (BID) | ORAL | Status: DC

## 2014-02-24 MED ORDER — PANTOPRAZOLE SODIUM 40 MG PO TBEC: 40.0000 mg | DELAYED_RELEASE_TABLET | Freq: Every day | ORAL | Status: DC

## 2014-02-24 MED ORDER — CYCLOBENZAPRINE HCL 10 MG PO TABS: ORAL_TABLET | ORAL | Status: DC

## 2014-02-24 MED ORDER — DOCUSATE SODIUM 100 MG PO CAPS: 100.0000 mg | ORAL_CAPSULE | Freq: Every day | ORAL | Status: DC

## 2014-02-24 NOTE — Progress Notes (Signed)
Patient presents today to establish care. She states that she has been having some increasing anxiety, constipation, and GERD. She was being [facility], but cannot recall the dosage. She states that she is no longer going there do to an insurance issue.

## 2014-02-24 NOTE — Patient Instructions (Signed)

## 2014-02-24 NOTE — Progress Notes (Signed)
Patient ID: Amanda [name], female   DOB: 06-Jul-1980, 34 y.o.   MRN: [id number]  HUT:[id number]  SFK:[id number]  DOB - 05/04/1980  CC:  Chief Complaint  Patient presents with  . Establish Care  . Gastrophageal Reflux  . Headache       HPI: Amanda [name] is a 34 y.o. female here today to establish medical care.  Patient has a past medical history asthma, GERD, anxiety, migraines, and heart murmur. Patient reports that she recently lost her primary insurance and now only has medicaid. She reports that she is now unable to go to her past medical providers.  She was previously being seen by [facility] in which she was receiving topamax and trigger point injections for migraines. Today she reports headaches 5-6 days per week that are usually 10/10 on the pain scale.  Nausea, dizziness, blurred vision, photophobia, and phonophobia all with headaches.  Patient reports that she has been walking daily, changed her diet (stopped red meat, pork, no caffeine), been uanble to lose weight or notice a difference in headache frequency. She is tearful because she is tired of having headaches   Patient is concerned about persistent constipation and nausea.  She reports that she had a colonoscopy/endo in 2011 due to abdominal pain. She has had chronic constipation since the birth of her last child 2 years ago. Goes twice weekly, with small pellets. She reports that she gets abdominal pain so bad it makes her have anxiety attack whenever she feels the urge to defecate. She notices acid reflux daily with increased belching.  She has only tried tums for symptom management. She did not do well with Nexium and Prilosec in the past.      Allergies  Allergen Reactions  . Doxycycline Hyclate Hives, Itching and Swelling    REACTION: swelling of face  . Ketorolac Tromethamine Other (See Comments)    Patient stated that it makes her extremely hyper  . Amoxicillin Swelling and Rash   REACTION: facial swelling and rash.   Past Medical History  Diagnosis Date  . Asthma   . GERD (gastroesophageal reflux disease)   . Headache(784.0)   . Anxiety   . Environmental allergies   . Acid reflux   . H/O hiatal hernia   . Depression   . Heart murmur   . Allergy   . Genital warts   . Constipation   . MRSA cellulitis    Current Outpatient Prescriptions on File Prior to Visit  Medication Sig Dispense Refill  . albuterol (PROVENTIL HFA;VENTOLIN HFA) 108 (90 BASE) MCG/ACT inhaler Inhale 2 puffs into your lungs q 4h prn cough and wheeze 1 Inhaler 0  . cyclobenzaprine (FLEXERIL) 5 MG tablet 1 to 2 tabs every 8 hours as needed for headache 30 tablet 2  . fexofenadine (ALLEGRA) 180 MG tablet Take 1 tablet (180 mg total) by mouth daily. 30 tablet 2  . fluticasone (FLONASE) 50 MCG/ACT nasal spray Place 2 sprays into both nostrils daily. 16 g 3  . hydrOXYzine (ATARAX/VISTARIL) 25 MG tablet Take 1 tablet (25 mg total) by mouth every 6 (six) hours as needed for anxiety. 30 tablet 2   No current facility-administered medications on file prior to visit.   Family History  Problem Relation Age of Onset  . Diabetes Mother   . Hypertension Mother   . Depression Mother   . Depression Father   . Anesthesia problems Neg Hx   . Breast cancer Maternal Aunt  [employer]  . Heart disease Maternal Grandmother   . Diabetes Maternal Grandfather   . Heart disease Maternal Grandfather    History   Social History  . Marital Status: Single    Spouse Name: N/A    Number of Children: 1  . Years of Education: 12+   Occupational History  . Not on file.   Social History Main Topics  . Smoking status: Never Smoker   . Smokeless tobacco: Never Used  . Alcohol Use: No  . Drug Use: No  . Sexual Activity: Yes    Birth Control/ Protection: None     Comment: last intercourse last night   Other Topics Concern  . Not on file   Social History Narrative   Regular exercise-no   Caffeine  Use-yes    Review of Systems  Constitutional: Negative.   Eyes: Positive for blurred vision.  Gastrointestinal: Positive for heartburn, nausea, abdominal pain and constipation. Negative for vomiting, diarrhea and blood in stool.  Neurological: Positive for dizziness and headaches.  Psychiatric/Behavioral: Negative for depression, suicidal ideas and substance abuse. The patient is nervous/anxious.   All other systems reviewed and are negative.     Objective:   Filed Vitals:   02/24/14 0955  BP: 107/71  Pulse: 60  Temp: 98.3 F (36.8 C)    Physical Exam: Constitutional: Patient appears well-developed and well-nourished. No distress. HENT: Normocephalic, atraumatic, External right and left ear normal. Oropharynx is clear and moist.  Eyes: Conjunctivae and EOM are normal. PERRLA, no scleral icterus. Neck: Normal ROM. Neck supple. No JVD. No tracheal deviation. No thyromegaly. CVS: RRR, S1/S2 +, no murmurs, no gallops, no carotid bruit.  Pulmonary: Effort and breath sounds normal, no stridor, rhonchi, wheezes, rales.  Abdominal: Soft. BS +, no distension, tenderness, rebound or guarding.  Musculoskeletal: Normal range of motion. No edema and no tenderness.  Neuro: Alert. Normal reflexes Skin: Skin is warm and dry. No rash noted. Not diaphoretic. No erythema. No pallor. Psychiatric: Normal mood and affect. Behavior, judgment, thought content normal.  Lab Results  Component Value Date   WBC 10.7* 07/12/2012   HGB 12.2 07/12/2012   HCT 38.7 07/12/2012   MCV 85.2 07/12/2012   PLT 193.0 06/20/2012   Lab Results  Component Value Date   CREATININE 0.7 06/20/2012   BUN 11 06/20/2012   NA 138 06/20/2012   K 3.9 06/20/2012   CL 106 06/20/2012   CO2 29 06/20/2012    Lab Results  Component Value Date   HGBA1C 5.1 06/20/2012   Lipid Panel     Component Value Date/Time   CHOL 166 06/20/2012 1526   TRIG 118.0 06/20/2012 1526   HDL 46.50 06/20/2012 1526   CHOLHDL 4  06/20/2012 1526   VLDL 23.6 06/20/2012 1526   LDLCALC 96 06/20/2012 1526       Assessment and plan:   [name] was seen today for establish care, gastrophageal reflux and headache.  Diagnoses and associated orders for this visit:  Frequent headaches - TSH - Ambulatory referral to Neurology---with hopes to get patient back in with [facility] - Begin topiramate (TOPAMAX) 25 MG tablet; Take 1 tablet (25 mg total) by mouth 2 (two) times daily. Week 1: take 1 tablet at bedtime, Week 2: Take 1 tablet twice per day - Begin cyclobenzaprine (FLEXERIL) 10 MG tablet; May use every 8 hours for headache Patient will need to keep a headache diary to see if she has noticed any improvement in frequency of headaches  Gastroesophageal  reflux disease, esophagitis presence not specified - CBC - COMPLETE METABOLIC PANEL WITH GFR - pantoprazole (PROTONIX) 40 MG tablet; Take 1 tablet (40 mg total) by mouth daily. Take as soon as you wake up, 30 minutes before eating Discussed diet and weight with patient relating to acid reflux.  Went over things that may exacerbate acid reflux such as tomatoes, spicy foods, coffee, carbonated beverages, chocolates, etc.  Advised patient to avoid laying down at least two hours after meals and sleep with HOB elevated.   Other fatigue - Vitamin D, 25-hydroxy  Morbid obesity - Lipid panel Weight loss discussed at length and its complications to health.  Patient will loss 10 months by next visit in 3 months.  Diet and exercise discussed as well as calorie intake.  Constipation, unspecified constipation type - polyethylene glycol powder (GLYCOLAX/MIRALAX) powder; Take 17 g by mouth daily. - docusate sodium (COLACE) 100 MG capsule; Take 1 capsule (100 mg total) by mouth at bedtime. Explained that the daily recommended amount of fiber is 25 g, went over high fiber foods, encourage increased water intake, miralax use, and increased physical activity.  Patient given constipation  handout.     Return in about 4 weeks (around 03/24/2014) for headaches.       [name], NP-C [facility] [phone] 02/24/2014, 10:14 AM

## 2014-02-25 LAB — VITAMIN D 25 HYDROXY (VIT D DEFICIENCY, FRACTURES): Vit D, 25-Hydroxy: 8 ng/mL — ABNORMAL LOW (ref 30–100)

## 2014-02-25 LAB — TSH: TSH: 0.598 u[IU]/mL (ref 0.350–4.500)

## 2014-02-26 ENCOUNTER — Telehealth: Payer: Self-pay | Admitting: *Deleted

## 2014-02-26 MED ORDER — VITAMIN D (ERGOCALCIFEROL) 1.25 MG (50000 UNIT) PO CAPS: 50000.0000 [IU] | ORAL_CAPSULE | ORAL | Status: DC

## 2014-02-26 NOTE — Telephone Encounter (Signed)
-----   Message from Lance Bosch, NP sent at 02/25/2014  8:54 PM EST ----- Labs are within normal limits with the exception of   Vitamin D is low. Please send drisdol 50,000 IU to take once weekly for 12 weeks. 12 tablets no refills.

## 2014-02-26 NOTE — Telephone Encounter (Signed)
Rx send to [facility] Pt aware of lab results

## 2014-04-14 ENCOUNTER — Encounter: Payer: Self-pay | Admitting: Neurology

## 2014-04-14 ENCOUNTER — Ambulatory Visit (INDEPENDENT_AMBULATORY_CARE_PROVIDER_SITE_OTHER): Payer: Medicaid Other | Admitting: Neurology

## 2014-04-14 VITALS — BP 122/90 | HR 68 | Temp 98.0°F | Resp 20 | Ht 64.0 in | Wt 259.0 lb

## 2014-04-14 DIAGNOSIS — G43719 Chronic migraine without aura, intractable, without status migrainosus: Secondary | ICD-10-CM

## 2014-04-14 MED ORDER — NORTRIPTYLINE HCL 25 MG PO CAPS: 25.0000 mg | ORAL_CAPSULE | Freq: Every day | ORAL | Status: DC

## 2014-04-14 MED ORDER — SUMATRIPTAN SUCCINATE 100 MG PO TABS: 100.0000 mg | ORAL_TABLET | Freq: Once | ORAL | Status: DC | PRN

## 2014-04-14 NOTE — Patient Instructions (Signed)
1.  Start nortriptyline 25mg  at bedtime.  Call in 4 weeks with update and we can adjust dose if needed. 2.  At earliest onset of headache, take sumatriptan 100mg .  May repeat once in 2 hours if needed. 3.  Follow sleep exercises 4.  Weight loss 5.  Follow up in 3 months.

## 2014-04-14 NOTE — Progress Notes (Signed)
NEUROLOGY CONSULTATION NOTE  [name] MRN: [id number] DOB: 10-May-1980  Referring provider: [name] Primary care provider: [name]  Reason for consult:  headache  HISTORY OF PRESENT ILLNESS: Amanda [name] is a 34 year old right-handed woman with asthma, GERD, depression and morbid obesity who presents for headaches.  Records and labs reviewed.  Onset:  adolescence Location:  Holocephalic from neck and shoulders to behind the eyes Quality:  Throbbing, pressure Intensity:  10/10 Aura:  no Prodrome:  no Associated symptoms:  Dizziness, nausea, photophobia, phonophobia Duration:  constant Frequency:  Daily (16-20 days severe) Triggers/exacerbating factors:  Stress, getting upset Relieving factors:  none Activity:  Difficult to function when severe  Past abortive therapy:  BC, Advil, Tylenol, Excedrin, Aleve Past preventative therapy:  Trigger point injections, Zoloft (for depression), topamax 100mg  (ineffective)  Current abortive therapy:  cyclobenzaprine 10mg , ice packs, massage Current preventative therapy:  none  TSH last month was 0.598.  Caffeine:  no Alcohol:  no Smoker:  no Diet:  Cut out pork, cheese, chocolate.  Increased water intake Exercise:  walking Depression/stress:  stress Sleep hygiene:  poor Family history of headache:  no  PAST MEDICAL HISTORY: Past Medical History  Diagnosis Date  . Asthma   . GERD (gastroesophageal reflux disease)   . Headache(784.0)   . Anxiety   . Environmental allergies   . Acid reflux   . H/O hiatal hernia   . Depression   . Heart murmur   . Allergy   . Genital warts   . Constipation   . MRSA cellulitis     PAST SURGICAL HISTORY: Past Surgical History  Procedure Laterality Date  . Cervical cone biopsy    . Marsupialization urethral diverticulum    . Cesarean section  01/20/2012    Procedure: CESAREAN SECTION;  Surgeon: Melina Schools, MD;  Location: [facility];  Service:  Obstetrics;  Laterality: N/A;  . Upper gi endoscopy      MEDICATIONS: Current Outpatient Prescriptions on File Prior to Visit  Medication Sig Dispense Refill  . hydrOXYzine (ATARAX/VISTARIL) 25 MG tablet Take 1 tablet (25 mg total) by mouth every 6 (six) hours as needed for anxiety. 30 tablet 2  . albuterol (PROVENTIL HFA;VENTOLIN HFA) 108 (90 BASE) MCG/ACT inhaler Inhale 2 puffs into your lungs q 4h prn cough and wheeze 1 Inhaler 0  . cyclobenzaprine (FLEXERIL) 10 MG tablet May use every 8 hours for headache 60 tablet 4  . docusate sodium (COLACE) 100 MG capsule Take 1 capsule (100 mg total) by mouth at bedtime. (Patient not taking: Reported on 04/14/2014) 30 capsule 5  . fexofenadine (ALLEGRA) 180 MG tablet Take 1 tablet (180 mg total) by mouth daily. (Patient not taking: Reported on 04/14/2014) 30 tablet 2  . fluticasone (FLONASE) 50 MCG/ACT nasal spray Place 2 sprays into both nostrils daily. (Patient not taking: Reported on 04/14/2014) 16 g 3  . pantoprazole (PROTONIX) 40 MG tablet Take 1 tablet (40 mg total) by mouth daily. Take as soon as you wake up, 30 minutes before eating (Patient not taking: Reported on 04/14/2014) 30 tablet 3  . polyethylene glycol powder (GLYCOLAX/MIRALAX) powder Take 17 g by mouth daily. (Patient not taking: Reported on 04/14/2014) 850 g 7  . topiramate (TOPAMAX) 25 MG tablet Take 1 tablet (25 mg total) by mouth 2 (two) times daily. Week 1: take 1 tablet at bedtime, Week 2: Take 1 tablet twice per day 60 tablet 1  . Vitamin D, Ergocalciferol, (DRISDOL) 50000 UNITS  CAPS capsule Take 1 capsule (50,000 Units total) by mouth every 7 (seven) days. 12 capsule 0   No current facility-administered medications on file prior to visit.    ALLERGIES: Allergies  Allergen Reactions  . Doxycycline Hyclate Hives, Itching and Swelling    REACTION: swelling of face  . Ketorolac Tromethamine Other (See Comments)    Patient stated that it makes her extremely hyper  . Amoxicillin  Swelling and Rash    REACTION: facial swelling and rash.    FAMILY HISTORY: Family History  Problem Relation Age of Onset  . Diabetes Mother   . Hypertension Mother   . Depression Mother   . Depression Father   . Anesthesia problems Neg Hx   . Breast cancer Maternal Aunt     [employer]  . Heart disease Maternal Grandmother   . Diabetes Maternal Grandfather   . Heart disease Maternal Grandfather   . Hypertension Maternal Grandfather     SOCIAL HISTORY: History   Social History  . Marital Status: Single    Spouse Name: N/A  . Number of Children: 1  . Years of Education: 12+   Occupational History  . Not on file.   Social History Main Topics  . Smoking status: Never Smoker   . Smokeless tobacco: Never Used  . Alcohol Use: No  . Drug Use: No  . Sexual Activity:    Partners: Male    Birth Control/ Protection: None     Comment: last intercourse last night   Other Topics Concern  . Not on file   Social History Narrative   Regular exercise-no   Caffeine Use-yes    REVIEW OF SYSTEMS: Constitutional: No fevers, chills, or sweats, no generalized fatigue, change in appetite Eyes: No visual changes, double vision, eye pain Ear, nose and throat: No hearing loss, ear pain, nasal congestion, sore throat Cardiovascular: No chest pain, palpitations Respiratory:  No shortness of breath at rest or with exertion, wheezes GastrointestinaI: No nausea, vomiting, diarrhea, abdominal pain, fecal incontinence Genitourinary:  No dysuria, urinary retention or frequency Musculoskeletal:  No neck pain, back pain Integumentary: No rash, pruritus, skin lesions Neurological: as above Psychiatric: No depression, insomnia, anxiety Endocrine: No palpitations, fatigue, diaphoresis, mood swings, change in appetite, change in weight, increased thirst Hematologic/Lymphatic:  No anemia, purpura, petechiae. Allergic/Immunologic: no itchy/runny eyes, nasal congestion, recent allergic reactions,  rashes  PHYSICAL EXAM: Filed Vitals:   04/14/14 1449  BP: 122/90  Pulse: 68  Temp: 98 F (36.7 C)  Resp: 20   General: No acute distress Head:  Normocephalic/atraumatic Eyes:  fundi unremarkable, without vessel changes, exudates, hemorrhages or papilledema. Neck: supple, no paraspinal tenderness, full range of motion Back: No paraspinal tenderness Heart: regular rate and rhythm Lungs: Clear to auscultation bilaterally. Vascular: No carotid bruits. Neurological Exam: Mental status: alert and oriented to person, place, and time, recent and remote memory intact, fund of knowledge intact, attention and concentration intact, speech fluent and not dysarthric, language intact. Cranial nerves: CN I: not tested CN II: pupils equal, round and reactive to light, visual fields intact, fundi unremarkable, without vessel changes, exudates, hemorrhages or papilledema. CN III, IV, VI:  full range of motion, no nystagmus, no ptosis CN V: facial sensation intact CN VII: upper and lower face symmetric CN VIII: hearing intact CN IX, X: gag intact, uvula midline CN XI: sternocleidomastoid and trapezius muscles intact CN XII: tongue midline Bulk & Tone: normal, no fasciculations. Motor:  5/5 throughout Sensation:  Temperature and vibration intact  Deep Tendon Reflexes:  2+ throughout, toes downgoing Finger to nose testing:  No dysmetria Heel to shin:  No dysmetria Gait:  Normal station and stride.  Able to turn and walk in tandem. Romberg negative.  IMPRESSION: Chronic migraine without aura, intractable  PLAN: 1.  Start nortriptyline 25mg   2.  Sumatriptan 100mg  for abortive therapy 3.  Weight loss 4.  Sleep hygiene 5.  Call in 4 weeks with update.  Follow up in 3 months.  Thank you for allowing me to take part in the care of this patient.  Metta Clines, DO  CC:  [name], NP

## 2014-04-15 ENCOUNTER — Emergency Department (HOSPITAL_COMMUNITY)
Admission: EM | Admit: 2014-04-15 | Discharge: 2014-04-15 | Disposition: A | Discharge status: Home / Self Care | Payer: Medicaid Other | Attending: Emergency Medicine | Admitting: Emergency Medicine

## 2014-04-15 ENCOUNTER — Encounter (HOSPITAL_COMMUNITY): Payer: Self-pay | Admitting: Neurology

## 2014-04-15 DIAGNOSIS — Z79899 Other long term (current) drug therapy: Secondary | ICD-10-CM | POA: Diagnosis not present

## 2014-04-15 DIAGNOSIS — K5900 Constipation, unspecified: Secondary | ICD-10-CM | POA: Insufficient documentation

## 2014-04-15 DIAGNOSIS — Z872 Personal history of diseases of the skin and subcutaneous tissue: Secondary | ICD-10-CM | POA: Diagnosis not present

## 2014-04-15 DIAGNOSIS — Z8619 Personal history of other infectious and parasitic diseases: Secondary | ICD-10-CM | POA: Diagnosis not present

## 2014-04-15 DIAGNOSIS — F419 Anxiety disorder, unspecified: Secondary | ICD-10-CM | POA: Insufficient documentation

## 2014-04-15 DIAGNOSIS — J45909 Unspecified asthma, uncomplicated: Secondary | ICD-10-CM | POA: Diagnosis not present

## 2014-04-15 DIAGNOSIS — R51 Headache: Secondary | ICD-10-CM

## 2014-04-15 DIAGNOSIS — K219 Gastro-esophageal reflux disease without esophagitis: Secondary | ICD-10-CM | POA: Diagnosis not present

## 2014-04-15 DIAGNOSIS — Z880 Allergy status to penicillin: Secondary | ICD-10-CM | POA: Diagnosis not present

## 2014-04-15 DIAGNOSIS — G8929 Other chronic pain: Secondary | ICD-10-CM | POA: Insufficient documentation

## 2014-04-15 DIAGNOSIS — Z7951 Long term (current) use of inhaled steroids: Secondary | ICD-10-CM | POA: Insufficient documentation

## 2014-04-15 DIAGNOSIS — G43909 Migraine, unspecified, not intractable, without status migrainosus: Secondary | ICD-10-CM | POA: Insufficient documentation

## 2014-04-15 DIAGNOSIS — R011 Cardiac murmur, unspecified: Secondary | ICD-10-CM | POA: Insufficient documentation

## 2014-04-15 MED ORDER — HALOPERIDOL LACTATE 5 MG/ML IJ SOLN
5.0000 mg | Freq: Once | INTRAMUSCULAR | Status: AC
  Administered 2014-04-15: 5 mg via INTRAMUSCULAR
  Filled 2014-04-15: qty 1

## 2014-04-15 NOTE — ED Provider Notes (Signed)
CSN: [id number]     Arrival date & time 04/15/14  1243 History   First MD Initiated Contact with Patient 04/15/14 1503     Chief Complaint  Patient presents with  . Migraine     (Consider location/radiation/quality/duration/timing/severity/associated sxs/prior Treatment) HPI Comments: Patient is a 34 year old female past medical history significant for asthma, GERD, chronic headaches, anxiety, depression presenting to the emergency department for 4 days of generalized headaches. Patient states she has had headaches for the last 10 years and is frustrated that no one "has done anything for me." Patient states she has seen several neurologists, current headache clinic, seen chiropractors with no improvement. She states she stopped going. She states she is having no relief from her Flexeril, [name], Topamax. She states she took these today. Denies any fevers, trauma, nausea, vomiting, visual disturbance, phonophobia. No changes in the patient's headaches.    Past Medical History  Diagnosis Date  . Asthma   . GERD (gastroesophageal reflux disease)   . Headache(784.0)   . Anxiety   . Environmental allergies   . Acid reflux   . H/O hiatal hernia   . Depression   . Heart murmur   . Allergy   . Genital warts   . Constipation   . MRSA cellulitis    Past Surgical History  Procedure Laterality Date  . Cervical cone biopsy    . Marsupialization urethral diverticulum    . Cesarean section  01/20/2012    Procedure: CESAREAN SECTION;  Surgeon: Melina Schools, MD;  Location: [facility];  Service: Obstetrics;  Laterality: N/A;  . Upper gi endoscopy     Family History  Problem Relation Age of Onset  . Diabetes Mother   . Hypertension Mother   . Depression Mother   . Depression Father   . Anesthesia problems Neg Hx   . Breast cancer Maternal Aunt     [employer]  . Heart disease Maternal Grandmother   . Diabetes Maternal Grandfather   . Heart disease Maternal Grandfather   . Hypertension  Maternal Grandfather    History  Substance Use Topics  . Smoking status: Never Smoker   . Smokeless tobacco: Never Used  . Alcohol Use: No   OB History    Gravida Para Term Preterm AB TAB SAB Ectopic Multiple Living   2 1 1  0 1 0 1 0 0 1     Review of Systems  Eyes: Positive for photophobia.  Neurological: Positive for headaches.  All other systems reviewed and are negative.     Allergies  Doxycycline hyclate; Ketorolac tromethamine; and Amoxicillin  Home Medications   Prior to Admission medications   Medication Sig Start Date End Date Taking? Authorizing Provider  albuterol (PROVENTIL HFA;VENTOLIN HFA) 108 (90 BASE) MCG/ACT inhaler Inhale 2 puffs into your lungs q 4h prn cough and wheeze 10/02/13  Yes Janne Napoleon, NP  cyclobenzaprine (FLEXERIL) 10 MG tablet May use every 8 hours for headache 02/24/14  Yes Lance Bosch, NP  docusate sodium (COLACE) 100 MG capsule Take 1 capsule (100 mg total) by mouth at bedtime. 02/24/14  Yes Lance Bosch, NP  fexofenadine (ALLEGRA) 180 MG tablet Take 1 tablet (180 mg total) by mouth daily. 09/29/13  Yes Harden Mo, MD  fluticasone (FLONASE) 50 MCG/ACT nasal spray Place 2 sprays into both nostrils daily. 09/29/13  Yes Harden Mo, MD  hydrOXYzine (ATARAX/VISTARIL) 25 MG tablet Take 1 tablet (25 mg total) by mouth every 6 (six) hours as needed  for anxiety. 09/29/13  Yes Harden Mo, MD  nortriptyline (PAMELOR) 25 MG capsule Take 1 capsule (25 mg total) by mouth at bedtime. 04/14/14  Yes Adam Telford Nab, DO  pantoprazole (PROTONIX) 40 MG tablet Take 1 tablet (40 mg total) by mouth daily. Take as soon as you wake up, 30 minutes before eating 02/24/14  Yes Lance Bosch, NP  polyethylene glycol powder (GLYCOLAX/MIRALAX) powder Take 17 g by mouth daily. 02/24/14  Yes Lance Bosch, NP  SUMAtriptan ([name]) 100 MG tablet Take 1 tablet (100 mg total) by mouth once as needed for migraine. May repeat in 2 hours if headache persists or recurs. 04/14/14  Yes  Pieter Partridge, DO  Vitamin D, Ergocalciferol, (DRISDOL) 50000 UNITS CAPS capsule Take 1 capsule (50,000 Units total) by mouth every 7 (seven) days. 02/26/14  Yes Lance Bosch, NP  topiramate (TOPAMAX) 25 MG tablet Take 1 tablet (25 mg total) by mouth 2 (two) times daily. Week 1: take 1 tablet at bedtime, Week 2: Take 1 tablet twice per day Patient not taking: Reported on 04/15/2014 02/24/14   Lance Bosch, NP   BP 113/76 mmHg  Pulse 64  Temp(Src) 98.3 F (36.8 C) (Oral)  Resp 16  Ht 5\' 3"  (1.6 m)  Wt 259 lb (117.482 kg)  BMI 45.89 kg/m2  SpO2 100%  LMP 04/13/2014 Physical Exam  Constitutional: She is oriented to person, place, and time. She appears well-developed and well-nourished. No distress.  HENT:  Head: Normocephalic and atraumatic.  Right Ear: External ear normal.  Left Ear: External ear normal.  Nose: Nose normal.  Mouth/Throat: Oropharynx is clear and moist. No oropharyngeal exudate.  Eyes: Conjunctivae and EOM are normal. Pupils are equal, round, and reactive to light.  Neck: Normal range of motion. Neck supple.  Cardiovascular: Normal rate, regular rhythm, normal heart sounds and intact distal pulses.   Pulmonary/Chest: Effort normal and breath sounds normal. No respiratory distress.  Abdominal: Soft. There is no tenderness.  Neurological: She is alert and oriented to person, place, and time. She has normal strength. No cranial nerve deficit. Gait normal. GCS eye subscore is 4. GCS verbal subscore is 5. GCS motor subscore is 6.  Sensation grossly intact.  No pronator drift.  Bilateral heel-knee-shin intact.  Skin: Skin is warm and dry. She is not diaphoretic.  Nursing note and vitals reviewed.   ED Course  Procedures (including critical care time) Medications  haloperidol lactate (HALDOL) injection 5 mg (5 mg Intramuscular Given 04/15/14 1729)    Labs Review Labs Reviewed - No data to display  Imaging Review No results found.   EKG Interpretation None       MDM   Final diagnoses:  None    Filed Vitals:   04/15/14 1454  BP: 113/76  Pulse: 64  Temp: 98.3 F (36.8 C)  Resp: 16   Afebrile, NAD, non-toxic appearing, AAOx4.  Pt HA treated and improved while in ED.  Presentation is like pts typical HA and non concerning for [facility], ICH, Meningitis, or temporal arteritis. Pt is afebrile with no focal neuro deficits, nuchal rigidity, or change in vision. Advised patient given chronicity of headaches and will likely take follow-up with other subspecialties for further evaluation. No indication for further imaging at this time. Return precautions discussed. Patient is stable at time of discharge    Baron Sane, PA-C 04/16/14 [place], MD 04/17/14 [phone]

## 2014-04-15 NOTE — Discharge Instructions (Signed)
Please follow up with your primary care physician in 1-2 days. If you do not have one please call the [facility] number listed above.Please follow up with a new neurologist to schedule a follow up appointment.  Please read all discharge instructions and return precautions.   General Headache Without Cause A headache is pain or discomfort felt around the head or neck area. The specific cause of a headache may not be found. There are many causes and types of headaches. A few common ones are:  Tension headaches.  Migraine headaches.  Cluster headaches.  Chronic daily headaches. HOME CARE INSTRUCTIONS   Keep all follow-up appointments with your caregiver or any specialist referral.  Only take over-the-counter or prescription medicines for pain or discomfort as directed by your caregiver.  Lie down in a dark, quiet room when you have a headache.  Keep a headache journal to find out what may trigger your migraine headaches. For example, write down:  What you eat and drink.  How much sleep you get.  Any change to your diet or medicines.  Try massage or other relaxation techniques.  Put ice packs or heat on the head and neck. Use these 3 to 4 times per day for 15 to 20 minutes each time, or as needed.  Limit stress.  Sit up straight, and do not tense your muscles.  Quit smoking if you smoke.  Limit alcohol use.  Decrease the amount of caffeine you drink, or stop drinking caffeine.  Eat and sleep on a regular schedule.  Get 7 to 9 hours of sleep, or as recommended by your caregiver.  Keep lights dim if bright lights bother you and make your headaches worse. SEEK MEDICAL CARE IF:   You have problems with the medicines you were prescribed.  Your medicines are not working.  You have a change from the usual headache.  You have nausea or vomiting. SEEK IMMEDIATE MEDICAL CARE IF:   Your headache becomes severe.  You have a fever.  You have a stiff  neck.  You have loss of vision.  You have muscular weakness or loss of muscle control.  You start losing your balance or have trouble walking.  You feel faint or pass out.  You have severe symptoms that are different from your first symptoms. MAKE SURE YOU:   Understand these instructions.  Will watch your condition.  Will get help right away if you are not doing well or get worse. Document Released: 01/10/2005 Document Revised: 04/04/2011 Document Reviewed: 01/26/2011 [facility] Patient Information 2015 [place], [place]. This information is not intended to replace advice given to you by your health care provider. Make sure you discuss any questions you have with your health care provider.

## 2014-04-15 NOTE — ED Notes (Signed)
Pt reports migraines since this morning; has hx of such. Is crying because she went to neurologist yesterday and only gave meds and no MRI.

## 2014-04-15 NOTE — ED Notes (Signed)
Pt st's she has had a migraine headache x's 4 days.  No nausea or vomiting.  Pt st's she has been going to migraine clinic without any help so she stopped going.

## 2014-04-15 NOTE — ED Notes (Signed)
Pt called for room with no response. Family states that she went to eat.

## 2014-05-21 ENCOUNTER — Other Ambulatory Visit: Payer: Self-pay | Admitting: Neurology

## 2014-07-21 ENCOUNTER — Encounter (HOSPITAL_COMMUNITY): Payer: Self-pay | Admitting: Emergency Medicine

## 2014-07-21 ENCOUNTER — Emergency Department (HOSPITAL_COMMUNITY)
Admission: EM | Admit: 2014-07-21 | Discharge: 2014-07-21 | Disposition: A | Discharge status: Home / Self Care | Payer: Medicaid Other | Source: Home / Self Care | Attending: Family Medicine | Admitting: Family Medicine

## 2014-07-21 ENCOUNTER — Other Ambulatory Visit (HOSPITAL_COMMUNITY)
Admission: RE | Admit: 2014-07-21 | Discharge: 2014-07-21 | Disposition: A | Discharge status: Home / Self Care | Payer: Medicaid Other | Source: Ambulatory Visit | Attending: Family Medicine | Admitting: Family Medicine

## 2014-07-21 DIAGNOSIS — Z113 Encounter for screening for infections with a predominantly sexual mode of transmission: Secondary | ICD-10-CM | POA: Insufficient documentation

## 2014-07-21 DIAGNOSIS — N760 Acute vaginitis: Secondary | ICD-10-CM | POA: Diagnosis not present

## 2014-07-21 LAB — POCT PREGNANCY, URINE: Preg Test, Ur: NEGATIVE

## 2014-07-21 MED ORDER — LIDOCAINE HCL (PF) 1 % IJ SOLN
INTRAMUSCULAR | Status: AC
  Filled 2014-07-21: qty 5

## 2014-07-21 MED ORDER — AZITHROMYCIN 250 MG PO TABS
ORAL_TABLET | ORAL | Status: AC
  Filled 2014-07-21: qty 4

## 2014-07-21 MED ORDER — CEFTRIAXONE SODIUM 250 MG IJ SOLR
250.0000 mg | Freq: Once | INTRAMUSCULAR | Status: AC
  Administered 2014-07-21: 250 mg via INTRAMUSCULAR

## 2014-07-21 MED ORDER — AZITHROMYCIN 250 MG PO TABS
1000.0000 mg | ORAL_TABLET | Freq: Once | ORAL | Status: AC
  Administered 2014-07-21: 1000 mg via ORAL

## 2014-07-21 MED ORDER — CEFTRIAXONE SODIUM 250 MG IJ SOLR
INTRAMUSCULAR | Status: AC
  Filled 2014-07-21: qty 250

## 2014-07-21 MED ORDER — METRONIDAZOLE 500 MG PO TABS: 500.0000 mg | ORAL_TABLET | Freq: Two times a day (BID) | ORAL | Status: DC

## 2014-07-21 NOTE — ED Notes (Signed)
C/o vag d/c onset 3-4 days associated w/abd/back pain, feeling nauseas and freq urination Denies fevers, chills Alert, no signs of acute distress.

## 2014-07-21 NOTE — ED Provider Notes (Signed)
Amanda [name] is a 34 y.o. female who presents to Urgent Care today for vaginal discharge with back pain and abdominal cramping and nausea. Symptoms present for about 4 days. Symptoms occurred after patient had sex with a condom fell off. She was able to retrieve the condom. No fevers or chills chest pains or palpitations. No diarrhea or urinary urgency or dysuria.   Past Medical History  Diagnosis Date  . Asthma   . GERD (gastroesophageal reflux disease)   . Headache(784.0)   . Anxiety   . Environmental allergies   . Acid reflux   . H/O hiatal hernia   . Depression   . Heart murmur   . Allergy   . Genital warts   . Constipation   . MRSA cellulitis    Past Surgical History  Procedure Laterality Date  . Cervical cone biopsy    . Marsupialization urethral diverticulum    . Cesarean section  01/20/2012    Procedure: CESAREAN SECTION;  Surgeon: Melina Schools, MD;  Location: [facility];  Service: Obstetrics;  Laterality: N/A;  . Upper gi endoscopy     History  Substance Use Topics  . Smoking status: Never Smoker   . Smokeless tobacco: Never Used  . Alcohol Use: No   ROS as above Medications: No current facility-administered medications for this encounter.   Current Outpatient Prescriptions  Medication Sig Dispense Refill  . albuterol (PROVENTIL HFA;VENTOLIN HFA) 108 (90 BASE) MCG/ACT inhaler Inhale 2 puffs into your lungs q 4h prn cough and wheeze 1 Inhaler 0  . cyclobenzaprine (FLEXERIL) 10 MG tablet May use every 8 hours for headache 60 tablet 4  . docusate sodium (COLACE) 100 MG capsule Take 1 capsule (100 mg total) by mouth at bedtime. 30 capsule 5  . fexofenadine (ALLEGRA) 180 MG tablet Take 1 tablet (180 mg total) by mouth daily. 30 tablet 2  . fluticasone (FLONASE) 50 MCG/ACT nasal spray Place 2 sprays into both nostrils daily. 16 g 3  . hydrOXYzine (ATARAX/VISTARIL) 25 MG tablet Take 1 tablet (25 mg total) by mouth every 6 (six) hours as needed for anxiety. 30  tablet 2  . metroNIDAZOLE (FLAGYL) 500 MG tablet Take 1 tablet (500 mg total) by mouth 2 (two) times daily. 14 tablet 0  . nortriptyline (PAMELOR) 25 MG capsule Take 1 capsule (25 mg total) by mouth at bedtime. 30 capsule 0  . pantoprazole (PROTONIX) 40 MG tablet Take 1 tablet (40 mg total) by mouth daily. Take as soon as you wake up, 30 minutes before eating 30 tablet 3  . polyethylene glycol powder (GLYCOLAX/MIRALAX) powder Take 17 g by mouth daily. 850 g 7  . SUMAtriptan ([name]) 100 MG tablet TAKE 1 TABLET BY MOUTH ONCE AS NEEDED FOR MIGRAINE MAY REPEAT IN 2 HOURS IF HEADACHE PERSISTS OR REC 9 tablet 0  . topiramate (TOPAMAX) 25 MG tablet Take 1 tablet (25 mg total) by mouth 2 (two) times daily. Week 1: take 1 tablet at bedtime, Week 2: Take 1 tablet twice per day (Patient not taking: Reported on 04/15/2014) 60 tablet 1  . Vitamin D, Ergocalciferol, (DRISDOL) 50000 UNITS CAPS capsule Take 1 capsule (50,000 Units total) by mouth every 7 (seven) days. 12 capsule 0   Allergies  Allergen Reactions  . Doxycycline Hyclate Hives, Itching and Swelling    REACTION: swelling of face  . Ketorolac Tromethamine Other (See Comments)    Patient stated that it makes her extremely hyper  . Amoxicillin Swelling and Rash  REACTION: facial swelling and rash.     Exam:  BP 122/67 mmHg  Pulse 86  Temp(Src) 98.7 F (37.1 C) (Oral)  Resp 20  SpO2 100%  LMP 07/11/2014 Gen: Well NAD obese HEENT: EOMI,  MMM Lungs: Normal work of breathing. CTABL Heart: RRR no MRG Abd: NABS, Soft. Nondistended, Nontender Exts: Brisk capillary refill, warm and well perfused.  GYN: Normal external genitalia. Vaginal canal with white discharge. Cervix is normal-appearing with clear discharge. Nontender. Bimanual exam reveals no cervical motion tenderness. Mild tenderness in the left adnexa without obvious mass. Right adnexa is nontender.  Patient was given a 1 g by mouth azithromycin dose as well as 250 mg IM  ceftriaxone.  Results for orders placed or performed during the hospital encounter of 07/21/14 (from the past 24 hour(s))  Pregnancy, urine POC     Status: None   Collection Time: 07/21/14  1:37 PM  Result Value Ref Range   Preg Test, Ur NEGATIVE NEGATIVE   No results found.  Assessment and Plan: 35 y.o. female with vaginitis. Cytology for gonorrhea Chlamydia trichomonas BV and yeast pending. Serology for HIV and syphilis pending. Medications as above. Prescribed Flagyl. Urine culture pending.  Discussed warning signs or symptoms. Please see discharge instructions. Patient expresses understanding.     Gregor Hams, MD 07/21/14 1438

## 2014-07-21 NOTE — Discharge Instructions (Signed)
Thank you for coming in today.  I will call if anything comes up with your labs.  If your belly pain worsens, or you have high fever, bad vomiting, blood in your stool or black tarry stool go to the Emergency Room.    Vaginitis Vaginitis is an inflammation of the vagina. It is most often caused by a change in the normal balance of the bacteria and yeast that live in the vagina. This change in balance causes an overgrowth of certain bacteria or yeast, which causes the inflammation. There are different types of vaginitis, but the most common types are:  Bacterial vaginosis.  Yeast infection (candidiasis).  Trichomoniasis vaginitis. This is a sexually transmitted infection (STI).  Viral vaginitis.  Atropic vaginitis.  Allergic vaginitis. CAUSES  The cause depends on the type of vaginitis. Vaginitis can be caused by:  Bacteria (bacterial vaginosis).  Yeast (yeast infection).  A parasite (trichomoniasis vaginitis)  A virus (viral vaginitis).  Low hormone levels (atrophic vaginitis). Low hormone levels can occur during pregnancy, breastfeeding, or after menopause.  Irritants, such as bubble baths, scented tampons, and feminine sprays (allergic vaginitis). Other factors can change the normal balance of the yeast and bacteria that live in the vagina. These include:  Antibiotic medicines.  Poor hygiene.  Diaphragms, vaginal sponges, spermicides, birth control pills, and intrauterine devices (IUD).  Sexual intercourse.  Infection.  Uncontrolled diabetes.  A weakened immune system. SYMPTOMS  Symptoms can vary depending on the cause of the vaginitis. Common symptoms include:  Abnormal vaginal discharge.  The discharge is white, gray, or yellow with bacterial vaginosis.  The discharge is thick, white, and cheesy with a yeast infection.  The discharge is frothy and yellow or greenish with trichomoniasis.  A bad vaginal odor.  The odor is fishy with bacterial  vaginosis.  Vaginal itching, pain, or swelling.  Painful intercourse.  Pain or burning when urinating. Sometimes, there are no symptoms. TREATMENT  Treatment will vary depending on the type of infection.   Bacterial vaginosis and trichomoniasis are often treated with antibiotic creams or pills.  Yeast infections are often treated with antifungal medicines, such as vaginal creams or suppositories.  Viral vaginitis has no cure, but symptoms can be treated with medicines that relieve discomfort. Your sexual partner should be treated as well.  Atrophic vaginitis may be treated with an estrogen cream, pill, suppository, or vaginal ring. If vaginal dryness occurs, lubricants and moisturizing creams may help. You may be told to avoid scented soaps, sprays, or douches.  Allergic vaginitis treatment involves quitting the use of the product that is causing the problem. Vaginal creams can be used to treat the symptoms. HOME CARE INSTRUCTIONS   Take all medicines as directed by your caregiver.  Keep your genital area clean and dry. Avoid soap and only rinse the area with water.  Avoid douching. It can remove the healthy bacteria in the vagina.  Do not use tampons or have sexual intercourse until your vaginitis has been treated. Use sanitary pads while you have vaginitis.  Wipe from front to back. This avoids the spread of bacteria from the rectum to the vagina.  Let air reach your genital area.  Wear cotton underwear to decrease moisture buildup.  Avoid wearing underwear while you sleep until your vaginitis is gone.  Avoid tight pants and underwear or nylons without a cotton panel.  Take off wet clothing (especially bathing suits) as soon as possible.  Use mild, non-scented products. Avoid using irritants, such as:  Scented  feminine sprays.  Fabric softeners.  Scented detergents.  Scented tampons.  Scented soaps or bubble baths.  Practice safe sex and use condoms. Condoms  may prevent the spread of trichomoniasis and viral vaginitis. SEEK MEDICAL CARE IF:   You have abdominal pain.  You have a fever or persistent symptoms for more than 2-3 days.  You have a fever and your symptoms suddenly get worse. Document Released: 11/07/2006 Document Revised: 10/05/2011 Document Reviewed: 06/23/2011 [facility] Patient Information 2015 [place], [place]. This information is not intended to replace advice given to you by your health care provider. Make sure you discuss any questions you have with your health care provider.   Pelvic Inflammatory Disease Pelvic inflammatory disease (PID) refers to an infection in some or all of the female organs. The infection can be in the uterus, ovaries, fallopian tubes, or the surrounding tissues in the pelvis. PID can cause abdominal or pelvic pain that comes on suddenly (acute pelvic pain). PID is a serious infection because it can lead to lasting (chronic) pelvic pain or the inability to have children (infertile).  CAUSES  The infection is often caused by the normal bacteria found in the vaginal tissues. PID may also be caused by an infection that is spread during sexual contact. PID can also occur following:   The birth of a baby.   A miscarriage.   An abortion.   Major pelvic surgery.   The use of an intrauterine device (IUD).   A sexual assault.  RISK FACTORS Certain factors can put a person at higher risk for PID, such as:  Being younger than 25 years.  Being sexually active at [place] age.  Usingnonbarrier contraception.  Havingmultiple sexual partners.  Having sex with someone who has symptoms of a genital infection.  Using oral contraception. Other times, certain behaviors can increase the possibility of getting PID, such as:  Having sex during your period.  Using a vaginal douche.  Having an intrauterine device (IUD) in place. SYMPTOMS   Abdominal or pelvic pain.   Fever.   Chills.    Abnormal vaginal discharge.  Abnormal uterine bleeding.   Unusual pain shortly after finishing your period. DIAGNOSIS  Your caregiver will choose some of the following methods to make a diagnosis, such as:   Performinga physical exam and history. A pelvic exam typically reveals a very tender uterus and surrounding pelvis.   Ordering laboratory tests including a pregnancy test, blood tests, and urine test.  Orderingcultures of the vagina and cervix to check for a sexually transmitted infection (STI).  Performing an ultrasound.   Performing a laparoscopic procedure to look inside the pelvis.  TREATMENT   Antibiotic medicines may be prescribed and taken by mouth.   Sexual partners may be treated when the infection is caused by a sexually transmitted disease (STD).   Hospitalization may be needed to give antibiotics intravenously.  Surgery may be needed, but this is rare. It may take weeks until you are completely well. If you are diagnosed with PID, you should also be checked for human immunodeficiency virus (HIV). HOME CARE INSTRUCTIONS   If given, take your antibiotics as directed. Finish the medicine even if you start to feel better.   Only take over-the-counter or prescription medicines for pain, discomfort, or fever as directed by your caregiver.   Do not have sexual intercourse until treatment is completed or as directed by your caregiver. If PID is confirmed, your recent sexual partner(s) will need treatment.   Keep your follow-up  appointments. SEEK MEDICAL CARE IF:   You have increased or abnormal vaginal discharge.   You need prescription medicine for your pain.   You vomit.   You cannot take your medicines.   Your partner has an STD.  SEEK IMMEDIATE MEDICAL CARE IF:   You have a fever.   You have increased abdominal or pelvic pain.   You have chills.   You have pain when you urinate.   You are not better after 72 hours  following treatment.  MAKE SURE YOU:   Understand these instructions.  Will watch your condition.  Will get help right away if you are not doing well or get worse. Document Released: 01/10/2005 Document Revised: 05/07/2012 Document Reviewed: 01/06/2011 [facility] Patient Information 2015 [place], [place]. This information is not intended to replace advice given to you by your health care provider. Make sure you discuss any questions you have with your health care provider.

## 2014-07-22 LAB — HIV ANTIBODY (ROUTINE TESTING W REFLEX): HIV Screen 4th Generation wRfx: NONREACTIVE

## 2014-07-22 LAB — CERVICOVAGINAL ANCILLARY ONLY
Chlamydia: NEGATIVE
NEISSERIA GONORRHEA: NEGATIVE
WET PREP (BD AFFIRM): NEGATIVE

## 2014-07-22 LAB — RPR: RPR: NONREACTIVE

## 2014-07-23 LAB — URINE CULTURE: Special Requests: NORMAL

## 2014-07-28 NOTE — ED Notes (Signed)
Final report of STD testing negative, no further actions required

## 2014-08-07 ENCOUNTER — Ambulatory Visit: Payer: Medicaid Other | Admitting: Internal Medicine

## 2014-08-14 ENCOUNTER — Ambulatory Visit: Payer: Medicaid Other | Attending: Internal Medicine | Admitting: Internal Medicine

## 2014-08-14 ENCOUNTER — Encounter: Payer: Self-pay | Admitting: Internal Medicine

## 2014-08-14 VITALS — BP 113/76 | HR 89 | Temp 98.3°F | Resp 18 | Ht 63.0 in | Wt 268.2 lb

## 2014-08-14 DIAGNOSIS — R51 Headache: Secondary | ICD-10-CM | POA: Diagnosis not present

## 2014-08-14 DIAGNOSIS — F419 Anxiety disorder, unspecified: Secondary | ICD-10-CM

## 2014-08-14 DIAGNOSIS — R519 Headache, unspecified: Secondary | ICD-10-CM

## 2014-08-14 MED ORDER — CYCLOBENZAPRINE HCL 10 MG PO TABS: ORAL_TABLET | ORAL | Status: DC

## 2014-08-14 MED ORDER — HYDROXYZINE HCL 25 MG PO TABS: 25.0000 mg | ORAL_TABLET | Freq: Four times a day (QID) | ORAL | Status: DC | PRN

## 2014-08-14 NOTE — Progress Notes (Signed)
Patient ID: Amanda [name], female   DOB: 08/10/1980, 34 y.o.   MRN: [id number]  CC: anxiety  HPI: Amanda [name] is a 34 y.o. female here today for a follow up visit.  Patient has past medical history of migraine headaches and asthma. Patient reports that she was seen in the urgent care last month for symptoms of vaginitis but never received her results. She is requesting results today.  She reports that she was seen by the neurologist in March for headaches but has not been back for a follow up appointment. She has continued to have headaches without relief. She has been using Flexeril for abortive therapy.  Patient reports that she is due to fly to [place] and she is concerned that she will become anxious. She has not been to [facility] in several months for a follow up visit as well. She is requesting Klonopin.   Patient has No chest pain, No abdominal pain - No Nausea, No new weakness tingling or numbness, No Cough - SOB.  Allergies  Allergen Reactions  . Doxycycline Hyclate Hives, Itching and Swelling    REACTION: swelling of face  . Ketorolac Tromethamine Other (See Comments)    Patient stated that it makes her extremely hyper  . Amoxicillin Swelling and Rash    REACTION: facial swelling and rash.   Past Medical History  Diagnosis Date  . Asthma   . GERD (gastroesophageal reflux disease)   . Headache(784.0)   . Anxiety   . Environmental allergies   . Acid reflux   . H/O hiatal hernia   . Depression   . Heart murmur   . Allergy   . Genital warts   . Constipation   . MRSA cellulitis    Current Outpatient Prescriptions on File Prior to Visit  Medication Sig Dispense Refill  . albuterol (PROVENTIL HFA;VENTOLIN HFA) 108 (90 BASE) MCG/ACT inhaler Inhale 2 puffs into your lungs q 4h prn cough and wheeze 1 Inhaler 0  . cyclobenzaprine (FLEXERIL) 10 MG tablet May use every 8 hours for headache 60 tablet 4  . fexofenadine (ALLEGRA) 180 MG tablet Take 1 tablet (180  mg total) by mouth daily. 30 tablet 2  . fluticasone (FLONASE) 50 MCG/ACT nasal spray Place 2 sprays into both nostrils daily. 16 g 3  . pantoprazole (PROTONIX) 40 MG tablet Take 1 tablet (40 mg total) by mouth daily. Take as soon as you wake up, 30 minutes before eating 30 tablet 3  . polyethylene glycol powder (GLYCOLAX/MIRALAX) powder Take 17 g by mouth daily. 850 g 7  . SUMAtriptan ([name]) 100 MG tablet TAKE 1 TABLET BY MOUTH ONCE AS NEEDED FOR MIGRAINE MAY REPEAT IN 2 HOURS IF HEADACHE PERSISTS OR REC 9 tablet 0  . docusate sodium (COLACE) 100 MG capsule Take 1 capsule (100 mg total) by mouth at bedtime. (Patient not taking: Reported on 08/14/2014) 30 capsule 5  . hydrOXYzine (ATARAX/VISTARIL) 25 MG tablet Take 1 tablet (25 mg total) by mouth every 6 (six) hours as needed for anxiety. (Patient not taking: Reported on 08/14/2014) 30 tablet 2  . metroNIDAZOLE (FLAGYL) 500 MG tablet Take 1 tablet (500 mg total) by mouth 2 (two) times daily. (Patient not taking: Reported on 08/14/2014) 14 tablet 0  . nortriptyline (PAMELOR) 25 MG capsule Take 1 capsule (25 mg total) by mouth at bedtime. (Patient not taking: Reported on 08/14/2014) 30 capsule 0  . Vitamin D, Ergocalciferol, (DRISDOL) 50000 UNITS CAPS capsule Take 1 capsule (50,000 Units total) by mouth  every 7 (seven) days. (Patient not taking: Reported on 08/14/2014) 12 capsule 0   No current facility-administered medications on file prior to visit.   Family History  Problem Relation Age of Onset  . Diabetes Mother   . Hypertension Mother   . Depression Mother   . Depression Father   . Anesthesia problems Neg Hx   . Breast cancer Maternal Aunt     [employer]  . Heart disease Maternal Grandmother   . Diabetes Maternal Grandfather   . Heart disease Maternal Grandfather   . Hypertension Maternal Grandfather    History   Social History  . Marital Status: Single    Spouse Name: N/A  . Number of Children: 1  . Years of Education: 12+    Occupational History  . Not on file.   Social History Main Topics  . Smoking status: Never Smoker   . Smokeless tobacco: Never Used  . Alcohol Use: No  . Drug Use: No  . Sexual Activity:    Partners: Male    Birth Control/ Protection: None     Comment: last intercourse last night   Other Topics Concern  . Not on file   Social History Narrative   Regular exercise-no   Caffeine Use-yes    Review of Systems: See HPI   Objective:   Filed Vitals:   08/14/14 1430  BP: 113/76  Pulse: 89  Temp: 98.3 F (36.8 C)  Resp: 18    Physical Exam  Constitutional: She is oriented to person, place, and time.  Cardiovascular: Normal rate, regular rhythm and normal heart sounds.   Pulmonary/Chest: Effort normal and breath sounds normal.  Neurological: She is alert and oriented to person, place, and time. No cranial nerve deficit.  Skin: Skin is warm and dry.  Psychiatric: She has a normal mood and affect.   Lab Results  Component Value Date   WBC 7.8 02/24/2014   HGB 12.9 02/24/2014   HCT 39.8 02/24/2014   MCV 81.7 02/24/2014   PLT SEE NOTE 02/24/2014   Lab Results  Component Value Date   CREATININE 0.71 02/24/2014   BUN 8 02/24/2014   NA 141 02/24/2014   K 4.4 02/24/2014   CL 106 02/24/2014   CO2 26 02/24/2014    Lab Results  Component Value Date   HGBA1C 5.1 06/20/2012   Lipid Panel     Component Value Date/Time   CHOL 157 02/24/2014 1057   TRIG 85 02/24/2014 1057   HDL 41 02/24/2014 1057   CHOLHDL 3.8 02/24/2014 1057   VLDL 17 02/24/2014 1057   LDLCALC 99 02/24/2014 1057       Assessment and plan:   Amanda [name] was seen today for follow-up.  Diagnoses and all orders for this visit:  Frequent headaches Orders: -     Refill cyclobenzaprine (FLEXERIL) 10 MG tablet; May use every 8 hours for headache Advised patient to make f/u appt with neurologist  Anxiety Orders: -     Refill hydrOXYzine (ATARAX/VISTARIL) 25 MG tablet; Take 1 tablet (25 mg  total) by mouth every 6 (six) hours as needed for anxiety. Explained that I will not prescribe benzo's for her but she is welcome to make f/u with [facility] if she feels she needs them again.   Return if symptoms worsen or fail to improve.       Lance Bosch, [facility] [phone] 08/14/2014, 2:41 PM'

## 2014-08-14 NOTE — Patient Instructions (Signed)
Call and see if you can switch your neurologist doctor.

## 2014-08-14 NOTE — Progress Notes (Signed)
Patient here for follow up and review of results from Urgent Care. Patient denies any pain today. Patient requests anxiety medications because she is taking a trip to [place] soon. Patient needs refill on inhaler and flexeril. Patient has taken her medications for today.

## 2014-10-16 ENCOUNTER — Other Ambulatory Visit (HOSPITAL_COMMUNITY)
Admission: RE | Admit: 2014-10-16 | Discharge: 2014-10-16 | Disposition: A | Discharge status: Home / Self Care | Payer: Medicaid Other | Source: Ambulatory Visit | Attending: Family Medicine | Admitting: Family Medicine

## 2014-10-16 ENCOUNTER — Encounter (HOSPITAL_COMMUNITY): Payer: Self-pay | Admitting: Emergency Medicine

## 2014-10-16 ENCOUNTER — Emergency Department (INDEPENDENT_AMBULATORY_CARE_PROVIDER_SITE_OTHER)
Admission: EM | Admit: 2014-10-16 | Discharge: 2014-10-16 | Disposition: A | Discharge status: Home / Self Care | Payer: Medicaid Other | Source: Home / Self Care

## 2014-10-16 DIAGNOSIS — G4700 Insomnia, unspecified: Secondary | ICD-10-CM

## 2014-10-16 DIAGNOSIS — Z113 Encounter for screening for infections with a predominantly sexual mode of transmission: Secondary | ICD-10-CM | POA: Insufficient documentation

## 2014-10-16 DIAGNOSIS — N760 Acute vaginitis: Secondary | ICD-10-CM | POA: Diagnosis present

## 2014-10-16 DIAGNOSIS — F430 Acute stress reaction: Secondary | ICD-10-CM

## 2014-10-16 LAB — POCT URINALYSIS DIP (DEVICE)
Bilirubin Urine: NEGATIVE
GLUCOSE, UA: NEGATIVE mg/dL
Hgb urine dipstick: NEGATIVE
Ketones, ur: NEGATIVE mg/dL
Leukocytes, UA: NEGATIVE
Nitrite: NEGATIVE
PH: 7.5 (ref 5.0–8.0)
Protein, ur: NEGATIVE mg/dL
SPECIFIC GRAVITY, URINE: 1.02 (ref 1.005–1.030)
Urobilinogen, UA: 0.2 mg/dL (ref 0.0–1.0)

## 2014-10-16 LAB — POCT PREGNANCY, URINE: Preg Test, Ur: NEGATIVE

## 2014-10-16 MED ORDER — METRONIDAZOLE 500 MG PO TABS: 500.0000 mg | ORAL_TABLET | Freq: Two times a day (BID) | ORAL | Status: DC

## 2014-10-16 MED ORDER — TRAZODONE HCL 50 MG PO TABS: 50.0000 mg | ORAL_TABLET | Freq: Every day | ORAL | Status: DC

## 2014-10-16 NOTE — ED Notes (Signed)
C/o vaginitis  States she is nausea and has stomach aches States she got a condom stuck but did pull it out States she has been using tampons

## 2014-10-16 NOTE — ED Provider Notes (Signed)
CSN: [id number]     Arrival date & time 10/16/14  1717 History   None    Chief Complaint  Patient presents with  . Vaginitis   (Consider location/radiation/quality/duration/timing/severity/associated sxs/prior Treatment) HPI    Insomnia: lost brother tragically 2-3 mo ago. Difficulty sleeping since them. Waiting for appt w/ [facility] to discuss further. No HI/SI.    Vaginal discharge. Last mo dx w/ BV. 4 days. Constant. No change since onset. Itchy. No recent abx outside of those for BV 1 mo ago. Sexually active w/ condoms though the condom has gotten stuck in pt on multiple occasions. Occasional baths in place of showers. LMP 10/03/14.       Past Medical History  Diagnosis Date  . Asthma   . GERD (gastroesophageal reflux disease)   . Headache(784.0)   . Anxiety   . Environmental allergies   . Acid reflux   . H/O hiatal hernia   . Depression   . Heart murmur   . Allergy   . Genital warts   . Constipation   . MRSA cellulitis    Past Surgical History  Procedure Laterality Date  . Cervical cone biopsy    . Marsupialization urethral diverticulum    . Cesarean section  01/20/2012    Procedure: CESAREAN SECTION;  Surgeon: Melina Schools, MD;  Location: [facility];  Service: Obstetrics;  Laterality: N/A;  . Upper gi endoscopy     Family History  Problem Relation Age of Onset  . Diabetes Mother   . Hypertension Mother   . Depression Mother   . Depression Father   . Anesthesia problems Neg Hx   . Breast cancer Maternal Aunt     [employer]  . Heart disease Maternal Grandmother   . Diabetes Maternal Grandfather   . Heart disease Maternal Grandfather   . Hypertension Maternal Grandfather    Social History  Substance Use Topics  . Smoking status: Never Smoker   . Smokeless tobacco: Never Used  . Alcohol Use: No   OB History    Gravida Para Term Preterm AB TAB SAB Ectopic Multiple Living   2 1 1  0 1 0 1 0 0 1     Review of Systems Per HPI with all other pertinent  systems negative.   Allergies  Doxycycline hyclate; Ketorolac tromethamine; and Amoxicillin  Home Medications   Prior to Admission medications   Medication Sig Start Date End Date Taking? Authorizing Provider  albuterol (PROVENTIL HFA;VENTOLIN HFA) 108 (90 BASE) MCG/ACT inhaler Inhale 2 puffs into your lungs q 4h prn cough and wheeze 10/02/13   Janne Napoleon, NP  cyclobenzaprine (FLEXERIL) 10 MG tablet May use every 8 hours for headache 08/14/14   Lance Bosch, NP  docusate sodium (COLACE) 100 MG capsule Take 1 capsule (100 mg total) by mouth at bedtime. Patient not taking: Reported on 08/14/2014 02/24/14   Lance Bosch, NP  fexofenadine (ALLEGRA) 180 MG tablet Take 1 tablet (180 mg total) by mouth daily. 09/29/13   Harden Mo, MD  fluticasone (FLONASE) 50 MCG/ACT nasal spray Place 2 sprays into both nostrils daily. 09/29/13   Harden Mo, MD  hydrOXYzine (ATARAX/VISTARIL) 25 MG tablet Take 1 tablet (25 mg total) by mouth every 6 (six) hours as needed for anxiety. 08/14/14   Lance Bosch, NP  metroNIDAZOLE (FLAGYL) 500 MG tablet Take 1 tablet (500 mg total) by mouth 2 (two) times daily. 10/16/14   Waldemar Dickens, MD  metroNIDAZOLE (FLAGYL) 500 MG  tablet Take 1 tablet (500 mg total) by mouth 2 (two) times daily. 10/16/14   Waldemar Dickens, MD  nortriptyline (PAMELOR) 25 MG capsule Take 1 capsule (25 mg total) by mouth at bedtime. Patient not taking: Reported on 08/14/2014 04/14/14   Pieter Partridge, DO  pantoprazole (PROTONIX) 40 MG tablet Take 1 tablet (40 mg total) by mouth daily. Take as soon as you wake up, 30 minutes before eating 02/24/14   Lance Bosch, NP  polyethylene glycol powder (GLYCOLAX/MIRALAX) powder Take 17 g by mouth daily. 02/24/14   Lance Bosch, NP  SUMAtriptan ([name]) 100 MG tablet TAKE 1 TABLET BY MOUTH ONCE AS NEEDED FOR MIGRAINE MAY REPEAT IN 2 HOURS IF HEADACHE PERSISTS OR REC 05/21/14   Pieter Partridge, DO  traZODone (DESYREL) 50 MG tablet Take 1-2 tablets (50-100 mg total) by  mouth at bedtime. 10/16/14   Waldemar Dickens, MD  Vitamin D, Ergocalciferol, (DRISDOL) 50000 UNITS CAPS capsule Take 1 capsule (50,000 Units total) by mouth every 7 (seven) days. Patient not taking: Reported on 08/14/2014 02/26/14   Lance Bosch, NP   Meds Ordered and Administered this Visit  Medications - No data to display  BP 130/82 mmHg  Pulse 64  Temp(Src) 98.3 F (36.8 C) (Oral)  Resp 16  SpO2 100% No data found.   Physical Exam Physical Exam  Constitutional: oriented to person, place, and time. appears well-developed and well-nourished. No distress.  HENT:  Head: Normocephalic and atraumatic.  Eyes: EOMI. PERRL.  Neck: Normal range of motion.  Cardiovascular: RRR, no m/r/g, 2+ distal pulses,  Pulmonary/Chest: Effort normal and breath sounds normal. No respiratory distress.  Abdominal: Soft. Bowel sounds are normal. NonTTP, no distension.  Musculoskeletal: Normal range of motion. Non ttp, no effusion.  Neurological: alert and oriented to person, place, and time.  Skin: Skin is warm. No rash noted. non diaphoretic.  Psychiatric: normal mood and affect. behavior is normal. Judgment and thought content normal.  Vaginal walls well rugated, thick white discharge, no cervical motion tenderness. ED Course  Procedures (including critical care time)  Labs Review Labs Reviewed  POCT URINALYSIS DIP (DEVICE)  POCT PREGNANCY, URINE  CERVICOVAGINAL ANCILLARY ONLY    Imaging Review No results found.   Visual Acuity Review  Right Eye Distance:   Left Eye Distance:   Bilateral Distance:    Right Eye Near:   Left Eye Near:    Bilateral Near:         MDM   1. Vaginitis   2. Insomnia   3. Stress reaction    Suspect recurrent BV. Temperature is all. Discussed need for, chronic treatment via [facility] by PCP. Patient will follow-up with PCP. Start trazodone for insomnia.    Waldemar Dickens, MD 10/16/14 [phone]

## 2014-10-16 NOTE — Discharge Instructions (Signed)
I am sorry to hear that your brother's passing. I can personally relate to which are going to. Please use the trazodone as needed for sleep. Please follow-up with your [facility] physicians sometime in the next month. Please start the metronidazole as it appears he have another BV infection. We'll call you if you need additional therapy. Please discuss chronic BV treatment with your primary care physician.

## 2014-10-17 LAB — CERVICOVAGINAL ANCILLARY ONLY
Chlamydia: NEGATIVE
Neisseria Gonorrhea: NEGATIVE

## 2014-10-20 LAB — CERVICOVAGINAL ANCILLARY ONLY: Wet Prep (BD Affirm): POSITIVE — AB

## 2014-10-20 NOTE — ED Notes (Signed)
Final report of vaginal swabs negative for GC , chlamydia, positive for gardnerella. treatment adequate w Rx provided for Flagyl on day of visit to [facility]

## 2015-01-21 ENCOUNTER — Ambulatory Visit: Payer: Medicaid Other | Attending: Internal Medicine | Admitting: Internal Medicine

## 2015-01-21 ENCOUNTER — Encounter: Payer: Self-pay | Admitting: Internal Medicine

## 2015-01-21 VITALS — BP 103/71 | HR 81 | Temp 98.0°F | Resp 16 | Ht 63.0 in | Wt 271.8 lb

## 2015-01-21 DIAGNOSIS — M722 Plantar fascial fibromatosis: Secondary | ICD-10-CM | POA: Diagnosis not present

## 2015-01-21 DIAGNOSIS — R1033 Periumbilical pain: Secondary | ICD-10-CM | POA: Insufficient documentation

## 2015-01-21 DIAGNOSIS — K219 Gastro-esophageal reflux disease without esophagitis: Secondary | ICD-10-CM | POA: Diagnosis not present

## 2015-01-21 DIAGNOSIS — Z880 Allergy status to penicillin: Secondary | ICD-10-CM | POA: Diagnosis not present

## 2015-01-21 DIAGNOSIS — F419 Anxiety disorder, unspecified: Secondary | ICD-10-CM | POA: Diagnosis not present

## 2015-01-21 DIAGNOSIS — Z888 Allergy status to other drugs, medicaments and biological substances status: Secondary | ICD-10-CM | POA: Diagnosis not present

## 2015-01-21 DIAGNOSIS — F329 Major depressive disorder, single episode, unspecified: Secondary | ICD-10-CM | POA: Insufficient documentation

## 2015-01-21 DIAGNOSIS — J45909 Unspecified asthma, uncomplicated: Secondary | ICD-10-CM | POA: Insufficient documentation

## 2015-01-21 DIAGNOSIS — Z79899 Other long term (current) drug therapy: Secondary | ICD-10-CM | POA: Insufficient documentation

## 2015-01-21 LAB — POCT URINALYSIS DIPSTICK
Bilirubin, UA: NEGATIVE
Glucose, UA: NEGATIVE
KETONES UA: NEGATIVE
LEUKOCYTES UA: NEGATIVE
Nitrite, UA: NEGATIVE
PH UA: 7
PROTEIN UA: NEGATIVE
RBC UA: NEGATIVE
SPEC GRAV UA: 1.02
Urobilinogen, UA: 0.2

## 2015-01-21 LAB — POCT URINE PREGNANCY: Preg Test, Ur: NEGATIVE

## 2015-01-21 NOTE — Progress Notes (Signed)
Patient ID: Amanda [name], female   DOB: 04/21/1980, 34 y.o.   MRN: [id number]  CC: umbilical area, feet pain  HPI: Amanda [name] is a 34 y.o. female here today for a follow up visit.  Patient has past medical history of asthma, GERD, anxiety, and depression. Patient states that she has been having pain around her umbilicus area for the past 1 month. She notes that the area bulges out when she presses down to use the restroom. Pain is also aggravated by lifting her toddler child. She notes that she was diagnosed with a hiatal hernia 5 years ago. She has never followed up since that time which was 01/2011 years ago. Review of [place] records do not show a hiatal hernia.  She complains of bilateral feet pain--feels like she has "needles" in the bottom of her foot. Has had normal x-rays. States that pain is in her sleep and when she gets out of bed she feel as if she cannot walk. Right foot is worse than left. Has been evaluated by podiatrist last year and has had this pain since then.   Allergies  Allergen Reactions  . Doxycycline Hyclate Hives, Itching and Swelling    REACTION: swelling of face  . Ketorolac Tromethamine Other (See Comments)    Patient stated that it makes her extremely hyper  . Amoxicillin Swelling and Rash    REACTION: facial swelling and rash.   Past Medical History  Diagnosis Date  . Asthma   . GERD (gastroesophageal reflux disease)   . Headache(784.0)   . Anxiety   . Environmental allergies   . Acid reflux   . H/O hiatal hernia   . Depression   . Heart murmur   . Allergy   . Genital warts   . Constipation   . MRSA cellulitis    Current Outpatient Prescriptions on File Prior to Visit  Medication Sig Dispense Refill  . albuterol (PROVENTIL HFA;VENTOLIN HFA) 108 (90 BASE) MCG/ACT inhaler Inhale 2 puffs into your lungs q 4h prn cough and wheeze 1 Inhaler 0  . cyclobenzaprine (FLEXERIL) 10 MG tablet May use every 8 hours for headache 60 tablet 4  .  docusate sodium (COLACE) 100 MG capsule Take 1 capsule (100 mg total) by mouth at bedtime. (Patient not taking: Reported on 08/14/2014) 30 capsule 5  . fexofenadine (ALLEGRA) 180 MG tablet Take 1 tablet (180 mg total) by mouth daily. 30 tablet 2  . fluticasone (FLONASE) 50 MCG/ACT nasal spray Place 2 sprays into both nostrils daily. 16 g 3  . hydrOXYzine (ATARAX/VISTARIL) 25 MG tablet Take 1 tablet (25 mg total) by mouth every 6 (six) hours as needed for anxiety. 60 tablet 1  . metroNIDAZOLE (FLAGYL) 500 MG tablet Take 1 tablet (500 mg total) by mouth 2 (two) times daily. 14 tablet 0  . metroNIDAZOLE (FLAGYL) 500 MG tablet Take 1 tablet (500 mg total) by mouth 2 (two) times daily. 14 tablet 0  . nortriptyline (PAMELOR) 25 MG capsule Take 1 capsule (25 mg total) by mouth at bedtime. (Patient not taking: Reported on 08/14/2014) 30 capsule 0  . pantoprazole (PROTONIX) 40 MG tablet Take 1 tablet (40 mg total) by mouth daily. Take as soon as you wake up, 30 minutes before eating 30 tablet 3  . polyethylene glycol powder (GLYCOLAX/MIRALAX) powder Take 17 g by mouth daily. 850 g 7  . SUMAtriptan ([name]) 100 MG tablet TAKE 1 TABLET BY MOUTH ONCE AS NEEDED FOR MIGRAINE MAY REPEAT IN 2 HOURS IF  HEADACHE PERSISTS OR REC 9 tablet 0  . traZODone (DESYREL) 50 MG tablet Take 1-2 tablets (50-100 mg total) by mouth at bedtime. 30 tablet 0  . Vitamin D, Ergocalciferol, (DRISDOL) 50000 UNITS CAPS capsule Take 1 capsule (50,000 Units total) by mouth every 7 (seven) days. (Patient not taking: Reported on 08/14/2014) 12 capsule 0   No current facility-administered medications on file prior to visit.   Family History  Problem Relation Age of Onset  . Diabetes Mother   . Hypertension Mother   . Depression Mother   . Depression Father   . Anesthesia problems Neg Hx   . Breast cancer Maternal Aunt     [employer]  . Heart disease Maternal Grandmother   . Diabetes Maternal Grandfather   . Heart disease Maternal  Grandfather   . Hypertension Maternal Grandfather    Social History   Social History  . Marital Status: Single    Spouse Name: N/A  . Number of Children: 1  . Years of Education: 12+   Occupational History  . Not on file.   Social History Main Topics  . Smoking status: Never Smoker   . Smokeless tobacco: Never Used  . Alcohol Use: No  . Drug Use: No  . Sexual Activity:    Partners: Male    Birth Control/ Protection: None     Comment: last intercourse last night   Other Topics Concern  . Not on file   Social History Narrative   Regular exercise-no   Caffeine Use-yes    Review of Systems: Other than what is stated in HPI, all other systems are negative.   Objective:   Filed Vitals:   01/21/15 1121  BP: 103/71  Pulse: 81  Temp: 98 F (36.7 C)  Resp: 16    Physical Exam  Constitutional: She is oriented to person, place, and time.  Cardiovascular: Normal rate, regular rhythm and normal heart sounds.   Pulmonary/Chest: Effort normal and breath sounds normal.  Abdominal: Soft. Bowel sounds are normal. There is tenderness (periumbilical ).  Musculoskeletal: She exhibits no tenderness (foot).  Neurological: She is alert and oriented to person, place, and time.     Lab Results  Component Value Date   WBC 7.8 02/24/2014   HGB 12.9 02/24/2014   HCT 39.8 02/24/2014   MCV 81.7 02/24/2014   PLT SEE NOTE 02/24/2014   Lab Results  Component Value Date   CREATININE 0.71 02/24/2014   BUN 8 02/24/2014   NA 141 02/24/2014   K 4.4 02/24/2014   CL 106 02/24/2014   CO2 26 02/24/2014    Lab Results  Component Value Date   HGBA1C 5.1 06/20/2012   Lipid Panel     Component Value Date/Time   CHOL 157 02/24/2014 1057   TRIG 85 02/24/2014 1057   HDL 41 02/24/2014 1057   CHOLHDL 3.8 02/24/2014 1057   VLDL 17 02/24/2014 1057   LDLCALC 99 02/24/2014 1057       Assessment and plan:   Amanda [name] was seen today for umbilical pain.  Diagnoses and all orders for  this visit:  Umbilical pain -     Urinalysis Dipstick -     POCT urine pregnancy -     US Abdomen Complete; Future  Plantar fasciitis, bilateral -     Ambulatory referral to Podiatry Foot exercises discussed.    Return if symptoms worsen or fail to improve.   Lance Bosch, [facility] [phone] 01/21/2015, 11:46  AM

## 2015-01-21 NOTE — Progress Notes (Signed)
Patient complains of having some pain around her umbilical area Patient states sometimes when she voids the area bulges out And when you press on the area it is uncomfortable Also have some pain to bilateral feet that at times feels like a stabbing pain

## 2015-01-30 ENCOUNTER — Emergency Department (HOSPITAL_COMMUNITY): Payer: Medicaid Other

## 2015-01-30 ENCOUNTER — Emergency Department (HOSPITAL_COMMUNITY)
Admission: EM | Admit: 2015-01-30 | Discharge: 2015-01-30 | Disposition: A | Discharge status: Home / Self Care | Payer: Medicaid Other | Attending: Emergency Medicine | Admitting: Emergency Medicine

## 2015-01-30 ENCOUNTER — Encounter (HOSPITAL_COMMUNITY): Payer: Self-pay | Admitting: Emergency Medicine

## 2015-01-30 DIAGNOSIS — Y999 Unspecified external cause status: Secondary | ICD-10-CM | POA: Insufficient documentation

## 2015-01-30 DIAGNOSIS — K219 Gastro-esophageal reflux disease without esophagitis: Secondary | ICD-10-CM | POA: Diagnosis not present

## 2015-01-30 DIAGNOSIS — Y9289 Other specified places as the place of occurrence of the external cause: Secondary | ICD-10-CM | POA: Diagnosis not present

## 2015-01-30 DIAGNOSIS — X509XXA Other and unspecified overexertion or strenuous movements or postures, initial encounter: Secondary | ICD-10-CM | POA: Insufficient documentation

## 2015-01-30 DIAGNOSIS — Z880 Allergy status to penicillin: Secondary | ICD-10-CM | POA: Insufficient documentation

## 2015-01-30 DIAGNOSIS — F419 Anxiety disorder, unspecified: Secondary | ICD-10-CM | POA: Diagnosis not present

## 2015-01-30 DIAGNOSIS — S93602A Unspecified sprain of left foot, initial encounter: Secondary | ICD-10-CM | POA: Diagnosis not present

## 2015-01-30 DIAGNOSIS — Z8619 Personal history of other infectious and parasitic diseases: Secondary | ICD-10-CM | POA: Diagnosis not present

## 2015-01-30 DIAGNOSIS — J45909 Unspecified asthma, uncomplicated: Secondary | ICD-10-CM | POA: Diagnosis not present

## 2015-01-30 DIAGNOSIS — R011 Cardiac murmur, unspecified: Secondary | ICD-10-CM | POA: Diagnosis not present

## 2015-01-30 DIAGNOSIS — T1490XA Injury, unspecified, initial encounter: Secondary | ICD-10-CM

## 2015-01-30 DIAGNOSIS — Z872 Personal history of diseases of the skin and subcutaneous tissue: Secondary | ICD-10-CM | POA: Diagnosis not present

## 2015-01-30 DIAGNOSIS — Z8614 Personal history of Methicillin resistant Staphylococcus aureus infection: Secondary | ICD-10-CM | POA: Diagnosis not present

## 2015-01-30 DIAGNOSIS — S99922A Unspecified injury of left foot, initial encounter: Secondary | ICD-10-CM | POA: Diagnosis present

## 2015-01-30 DIAGNOSIS — Y9389 Activity, other specified: Secondary | ICD-10-CM | POA: Insufficient documentation

## 2015-01-30 DIAGNOSIS — Z79899 Other long term (current) drug therapy: Secondary | ICD-10-CM | POA: Insufficient documentation

## 2015-01-30 DIAGNOSIS — Z792 Long term (current) use of antibiotics: Secondary | ICD-10-CM | POA: Diagnosis not present

## 2015-01-30 DIAGNOSIS — K5900 Constipation, unspecified: Secondary | ICD-10-CM | POA: Insufficient documentation

## 2015-01-30 DIAGNOSIS — Z7951 Long term (current) use of inhaled steroids: Secondary | ICD-10-CM | POA: Diagnosis not present

## 2015-01-30 MED ORDER — ACETAMINOPHEN-CODEINE #3 300-30 MG PO TABS: 1.0000 | ORAL_TABLET | Freq: Four times a day (QID) | ORAL | Status: DC | PRN

## 2015-01-30 NOTE — ED Notes (Signed)
Patient coming from home with c/o of left foot pain ongoing x 1 week.  Foot has mild swelling with mild numbness and pain is 8/10.

## 2015-01-30 NOTE — Discharge Instructions (Signed)
Elastic Bandage and RICE °WHAT DOES AN ELASTIC BANDAGE DO? °Elastic bandages come in different shapes and sizes. They generally provide support to your injury and reduce swelling while you are healing, but they can perform different functions. Your health care provider will help you to decide what is best for your protection, recovery, or rehabilitation following an injury. °WHAT ARE SOME GENERAL TIPS FOR USING AN ELASTIC BANDAGE? °· Use the bandage as directed by the maker of the bandage that you are using. °· Do not wrap the bandage too tightly. This may cut off the circulation in the arm or leg in the area below the bandage. °· If part of your body beyond the bandage becomes blue, numb, cold, swollen, or is more painful, your bandage is most likely too tight. If this occurs, remove your bandage and reapply it more loosely. °· See your health care provider if the bandage seems to be making your problems worse rather than better. °· An elastic bandage should be removed and reapplied every 3-4 hours or as directed by your health care provider. °WHAT IS RICE? °The routine care of many injuries includes rest, ice, compression, and elevation (RICE therapy).  °Rest °Rest is required to allow your body to heal. Generally, you can resume your routine activities when you are comfortable and have been given permission by your health care provider. °Ice °Icing your injury helps to keep the swelling down and it reduces pain. Do not apply ice directly to your skin. °· Put ice in a plastic bag. °· Place a towel between your skin and the bag. °· Leave the ice on for 20 minutes, 2-3 times per day. °Do this for as long as you are directed by your health care provider. °Compression °Compression helps to keep swelling down, gives support, and helps with discomfort. Compression may be done with an elastic bandage. °Elevation °Elevation helps to reduce swelling and it decreases pain. If possible, your injured area should be placed at  or above the level of your heart or the center of your chest. °WHEN SHOULD I SEEK MEDICAL CARE? °You should seek medical care if: °· You have persistent pain and swelling. °· Your symptoms are getting worse rather than improving. °These symptoms may indicate that further evaluation or further X-rays are needed. Sometimes, X-rays may not show a small broken bone (fracture) until a number of days later. Make a follow-up appointment with your health care provider. Ask when your X-ray results will be ready. Make sure that you get your X-ray results. °WHEN SHOULD I SEEK IMMEDIATE MEDICAL CARE? °You should seek immediate medical care if: °· You have a sudden onset of severe pain at or below the area of your injury. °· You develop redness or increased swelling around your injury. °· You have tingling or numbness at or below the area of your injury that does not improve after you remove the elastic bandage. °  °This information is not intended to replace advice given to you by your health care provider. Make sure you discuss any questions you have with your health care provider. °  °Document Released: 07/02/2001 Document Revised: 10/01/2014 Document Reviewed: 08/26/2013 °Elsevier Interactive Patient Education ©2016 [employer] ° °Foot Sprain °A foot sprain is an injury to one of the strong bands of tissue (ligaments) that connect and support the many bones in your feet. The ligament can be stretched too much or it can tear. A tear can be either partial or complete. The severity of the sprain   depends on how much of the ligament was damaged or torn. °CAUSES °A foot sprain is usually caused by suddenly twisting or pivoting your foot. °RISK FACTORS °This injury is more likely to occur in people who: °· Play a sport, such as basketball or football. °· Exercise or play a sport without warming up. °· Start a new workout or sport. °· Suddenly increase how long or hard they exercise or play a sport. °SYMPTOMS °Symptoms of this  condition start soon after an injury and include: °· Pain, especially in the arch of the foot. °· Bruising. °· Swelling. °· Inability to walk or use the foot to support body weight. °DIAGNOSIS °This condition is diagnosed with a medical history and physical exam. You may also have imaging tests, such as: °· X-rays to make sure there are no broken bones (fractures). °· MRI to see if the ligament has torn. °TREATMENT °Treatment varies depending on the severity of your sprain. Mild sprains can be treated with rest, ice, compression, and elevation (RICE). If your ligament is overstretched or partially torn, treatment usually involves keeping your foot in a fixed position (immobilization) for a period of time. To help you do this, your health care provider will apply a bandage, splint, or walking boot to keep your foot from moving until it heals. You may also be advised to use crutches or a scooter for a few weeks to avoid bearing weight on your foot while it is healing. °If your ligament is fully torn, you may need surgery to reconnect the ligament to the bone. After surgery, a cast or splint will be applied and will need to stay on your foot while it heals. °Your health care provider may also suggest exercises or physical therapy to strengthen your foot. °HOME CARE INSTRUCTIONS °If You Have a Bandage, Splint, or Walking Boot: °· Wear it as directed by your health care provider. Remove it only as directed by your health care provider. °· Loosen the bandage, splint, or walking boot if your toes become numb and tingle, or if they turn cold and blue. °Bathing °· If your health care provider approves bathing and showering, cover the bandage or splint with a watertight plastic bag to protect it from water. Do not let the bandage or splint get wet. °Managing Pain, Stiffness, and Swelling  °· If directed, apply ice to the injured area: °¨ Put ice in a plastic bag. °¨ Place a towel between your skin and the bag. °¨ Leave the  ice on for 20 minutes, 2-3 times per day. °· Move your toes often to avoid stiffness and to lessen swelling. °· Raise (elevate) the injured area above the level of your heart while you are sitting or lying down. °Driving °· Do not drive or operate heavy machinery while taking pain medicine. °· Do not drive while wearing a bandage, splint, or walking boot on a foot that you use for driving. °Activity °· Rest as directed by your health care provider. °· Do not use the injured foot to support your body weight until your health care provider says that you can. Use crutches or other supportive devices as directed by your health care provider. °· Ask your health care provider what activities are safe for you. Gradually increase how much and how far you walk until your health care provider says it is safe to return to full activity. °· Do any exercise or physical therapy as directed by your health care provider. °General Instructions °· If a splint   was applied, do not put pressure on any part of it until it is fully hardened. This may take several hours. °· Take medicines only as directed by your health care provider. These include over-the-counter medicines and prescription medicines. °· Keep all follow-up visits as directed by your health care provider. This is important. °· When you can walk without pain, wear supportive shoes that have stiff soles. Do not wear flip-flops, and do not walk barefoot. °SEEK MEDICAL CARE IF: °· Your pain is not controlled with medicine. °· Your bruising or swelling gets worse or does not get better with treatment. °· Your splint or walking boot is damaged. °SEEK IMMEDIATE MEDICAL CARE IF: °· Your foot is numb or blue. °· Your foot feels colder than normal. °  °This information is not intended to replace advice given to you by your health care provider. Make sure you discuss any questions you have with your health care provider. °  °Document Released: 07/02/2001 Document Revised: 05/27/2014  Document Reviewed: 11/13/2013 °Elsevier Interactive Patient Education ©2016 [employer] ° °

## 2015-01-30 NOTE — ED Provider Notes (Signed)
History  By signing my name below, I, Marlowe Kays, attest that this documentation has been prepared under the direction and in the presence of Domenic Moras, PA-C. Electronically Signed: Marlowe Kays, ED Scribe. 01/30/2015. 5:35 PM.  Chief Complaint  Patient presents with  . Foot Pain   The history is provided by the patient and medical records. No language interpreter was used.    HPI Comments:  Amanda [name] is a 35 y.o. female morbidly obese who presents to the Emergency Department complaining of severe, throbbing left foot pain that began about one week ago secondary to twisting it from stepping on uneven ground. She reports associated numbness and tingling. She states she has been soaking the foot in warm water with minimal relief. Moving the foot and stepping down on it increases the pain. She denies alleviating factors. She denies bruising, wounds, fever, chills, nausea or vomiting.   Past Medical History  Diagnosis Date  . Asthma   . GERD (gastroesophageal reflux disease)   . Headache(784.0)   . Anxiety   . Environmental allergies   . Acid reflux   . H/O hiatal hernia   . Depression   . Heart murmur   . Allergy   . Genital warts   . Constipation   . MRSA cellulitis    Past Surgical History  Procedure Laterality Date  . Cervical cone biopsy    . Marsupialization urethral diverticulum    . Cesarean section  01/20/2012    Procedure: CESAREAN SECTION;  Surgeon: Melina Schools, MD;  Location: [facility];  Service: Obstetrics;  Laterality: N/A;  . Upper gi endoscopy     Family History  Problem Relation Age of Onset  . Diabetes Mother   . Hypertension Mother   . Depression Mother   . Depression Father   . Anesthesia problems Neg Hx   . Breast cancer Maternal Aunt     [employer]  . Heart disease Maternal Grandmother   . Diabetes Maternal Grandfather   . Heart disease Maternal Grandfather   . Hypertension Maternal Grandfather    Social History   Substance Use Topics  . Smoking status: Never Smoker   . Smokeless tobacco: Never Used  . Alcohol Use: No   OB History    Gravida Para Term Preterm AB TAB SAB Ectopic Multiple Living   2 1 1  0 1 0 1 0 0 1     Review of Systems  Constitutional: Negative for fever and chills.  Gastrointestinal: Negative for nausea and vomiting.  Musculoskeletal: Positive for arthralgias.  Skin: Negative for color change and wound.  Neurological: Positive for numbness.   Allergies  Doxycycline hyclate; Ketorolac tromethamine; and Amoxicillin  Home Medications   Prior to Admission medications   Medication Sig Start Date End Date Taking? Authorizing Provider  albuterol (PROVENTIL HFA;VENTOLIN HFA) 108 (90 BASE) MCG/ACT inhaler Inhale 2 puffs into your lungs q 4h prn cough and wheeze 10/02/13   Janne Napoleon, NP  cyclobenzaprine (FLEXERIL) 10 MG tablet May use every 8 hours for headache 08/14/14   Lance Bosch, NP  docusate sodium (COLACE) 100 MG capsule Take 1 capsule (100 mg total) by mouth at bedtime. Patient not taking: Reported on 08/14/2014 02/24/14   Lance Bosch, NP  fexofenadine (ALLEGRA) 180 MG tablet Take 1 tablet (180 mg total) by mouth daily. 09/29/13   Harden Mo, MD  fluticasone (FLONASE) 50 MCG/ACT nasal spray Place 2 sprays into both nostrils daily. 09/29/13   Monia Sabal  Jake Michaelis, MD  hydrOXYzine (ATARAX/VISTARIL) 25 MG tablet Take 1 tablet (25 mg total) by mouth every 6 (six) hours as needed for anxiety. 08/14/14   Lance Bosch, NP  metroNIDAZOLE (FLAGYL) 500 MG tablet Take 1 tablet (500 mg total) by mouth 2 (two) times daily. 10/16/14   Waldemar Dickens, MD  metroNIDAZOLE (FLAGYL) 500 MG tablet Take 1 tablet (500 mg total) by mouth 2 (two) times daily. 10/16/14   Waldemar Dickens, MD  nortriptyline (PAMELOR) 25 MG capsule Take 1 capsule (25 mg total) by mouth at bedtime. Patient not taking: Reported on 08/14/2014 04/14/14   Pieter Partridge, DO  pantoprazole (PROTONIX) 40 MG tablet Take 1 tablet (40 mg  total) by mouth daily. Take as soon as you wake up, 30 minutes before eating 02/24/14   Lance Bosch, NP  polyethylene glycol powder (GLYCOLAX/MIRALAX) powder Take 17 g by mouth daily. 02/24/14   Lance Bosch, NP  SUMAtriptan ([name]) 100 MG tablet TAKE 1 TABLET BY MOUTH ONCE AS NEEDED FOR MIGRAINE MAY REPEAT IN 2 HOURS IF HEADACHE PERSISTS OR REC 05/21/14   Pieter Partridge, DO  traZODone (DESYREL) 50 MG tablet Take 1-2 tablets (50-100 mg total) by mouth at bedtime. 10/16/14   Waldemar Dickens, MD  Vitamin D, Ergocalciferol, (DRISDOL) 50000 UNITS CAPS capsule Take 1 capsule (50,000 Units total) by mouth every 7 (seven) days. Patient not taking: Reported on 08/14/2014 02/26/14   Lance Bosch, NP   Triage Vitals: BP 136/78 mmHg  Pulse 104  Temp(Src) 97.9 F (36.6 C) (Oral)  Resp 18  SpO2 97% Physical Exam  Constitutional: She is oriented to person, place, and time. She appears well-developed and well-nourished.  HENT:  Head: Normocephalic and atraumatic.  Eyes: EOM are normal.  Neck: Normal range of motion.  Cardiovascular: Normal rate.   Left DP pulses intact.  Pulmonary/Chest: Effort normal.  Musculoskeletal: Normal range of motion. She exhibits tenderness.  Left foot tenderness noted to dorsum of left foot upon palpation, without obvious deformity or overlying skin changes. Left ankle with full ROM.  Neurological: She is alert and oriented to person, place, and time.  Skin: Skin is warm and dry.  Psychiatric: She has a normal mood and affect. Her behavior is normal.  Nursing note and vitals reviewed.   ED Course  Procedures (including critical care time) DIAGNOSTIC STUDIES: Oxygen Saturation is 97% on RA, normal by my interpretation.   COORDINATION OF CARE: 5:34 PM- Will X-Ray left foot. Pt verbalizes understanding and agrees to plan.  Medications - No data to display  Labs Review Labs Reviewed - No data to display  Imaging Review No results found. I have personally reviewed  and evaluated these images and lab results as part of my medical decision-making.   EKG Interpretation None      MDM   Final diagnoses:  Foot sprain, left, initial encounter    BP 136/78 mmHg  Pulse 104  Temp(Src) 97.9 F (36.6 C) (Oral)  Resp 18  SpO2 97%  LMP 01/11/2015   I personally performed the services described in this documentation, which was scribed in my presence. The recorded information has been reviewed and is accurate.     7:02 PM L foot sprain, xray neg for acute fx.  Pt able to ambulate.  She is NVI.  Ace wrap and RICE therapy discussed.   Domenic Moras, PA-C 01/30/15 1902  Leo Grosser, MD 02/01/15 [phone]

## 2015-01-30 NOTE — ED Notes (Signed)
See PA note secondary assessment.

## 2015-02-02 ENCOUNTER — Ambulatory Visit (HOSPITAL_COMMUNITY)
Admission: RE | Admit: 2015-02-02 | Discharge: 2015-02-02 | Disposition: A | Discharge status: Home / Self Care | Payer: Medicaid Other | Source: Ambulatory Visit | Attending: Internal Medicine | Admitting: Internal Medicine

## 2015-02-02 DIAGNOSIS — R1033 Periumbilical pain: Secondary | ICD-10-CM | POA: Insufficient documentation

## 2015-02-09 ENCOUNTER — Ambulatory Visit (INDEPENDENT_AMBULATORY_CARE_PROVIDER_SITE_OTHER): Payer: Medicaid Other

## 2015-02-09 ENCOUNTER — Ambulatory Visit (INDEPENDENT_AMBULATORY_CARE_PROVIDER_SITE_OTHER): Payer: Medicaid Other | Admitting: Sports Medicine

## 2015-02-09 DIAGNOSIS — M2141 Flat foot [pes planus] (acquired), right foot: Secondary | ICD-10-CM | POA: Diagnosis not present

## 2015-02-09 DIAGNOSIS — M79673 Pain in unspecified foot: Secondary | ICD-10-CM

## 2015-02-09 DIAGNOSIS — M2142 Flat foot [pes planus] (acquired), left foot: Secondary | ICD-10-CM | POA: Diagnosis not present

## 2015-02-09 DIAGNOSIS — M2140 Flat foot [pes planus] (acquired), unspecified foot: Secondary | ICD-10-CM | POA: Diagnosis not present

## 2015-02-09 DIAGNOSIS — M729 Fibroblastic disorder, unspecified: Secondary | ICD-10-CM

## 2015-02-09 MED ORDER — METHYLPREDNISOLONE 4 MG PO TBPK: ORAL_TABLET | ORAL | Status: DC

## 2015-02-09 MED ORDER — DICLOFENAC SODIUM 75 MG PO TBEC: 75.0000 mg | DELAYED_RELEASE_TABLET | Freq: Two times a day (BID) | ORAL | Status: DC

## 2015-02-09 NOTE — Progress Notes (Signed)
Patient ID: Amanda [name], female   DOB: 1980/04/12, 35 y.o.   MRN: [id number] Subjective: Amanda [name] is a 35 y.o. female patient who presents to office for evaluation of Right> Left foot pain. Patient complains of progressive pain especially over the last 2 years in the Right>Left foot at the arch and heel; reports that on her right foot it feels like a stabbing pain and on her left foot feels like a throbbing pain in the arch that sometimes radiates to the top of the foot. Patient has tried heat, ice, pain meds, gel inserts with no relief in symptoms. Admits that hot showers help some.Patient admits that her left foot was hurting bad >1 week ago she went to urgent care where she got xray and was put in boot and given meds. Patient denies any other pedal complaints. Denies injury/trip/fall/sprain/any causative factors.   Patient Active Problem List   Diagnosis Date Noted  . Murmur 06/20/2012  . INSOMNIA 08/14/2009  . DYSMENORRHEA 12/09/2008  . SINUSITIS, ACUTE 05/30/2008  . BREAST PAIN, BILATERAL 03/25/2008  . FATIGUE 03/25/2008  . CANDIDIASIS 01/02/2008  . DEPRESSION/ANXIETY 09/11/2007  . HEADACHE 09/11/2007  . PALPITATIONS 02/23/2007  . CHEST PAIN 02/23/2007  . ABDOMINAL PAIN, EPIGASTRIC 02/23/2007  . PAP SMEAR, ABNORMAL 10/09/2006  . GONORRHEA, ACUTE 10/06/2006  . OBESITY 10/06/2006  . ALLERGIC RHINITIS 10/06/2006  . ASTHMA 10/06/2006  . GERD 10/06/2006   Current Outpatient Prescriptions on File Prior to Visit  Medication Sig Dispense Refill  . acetaminophen-codeine (TYLENOL #3) 300-30 MG tablet Take 1-2 tablets by mouth every 6 (six) hours as needed for moderate pain. 15 tablet 0  . albuterol (PROVENTIL HFA;VENTOLIN HFA) 108 (90 BASE) MCG/ACT inhaler Inhale 2 puffs into your lungs q 4h prn cough and wheeze 1 Inhaler 0  . cyclobenzaprine (FLEXERIL) 10 MG tablet May use every 8 hours for headache 60 tablet 4  . docusate sodium (COLACE) 100 MG capsule Take 1  capsule (100 mg total) by mouth at bedtime. (Patient not taking: Reported on 08/14/2014) 30 capsule 5  . fexofenadine (ALLEGRA) 180 MG tablet Take 1 tablet (180 mg total) by mouth daily. 30 tablet 2  . fluticasone (FLONASE) 50 MCG/ACT nasal spray Place 2 sprays into both nostrils daily. 16 g 3  . hydrOXYzine (ATARAX/VISTARIL) 25 MG tablet Take 1 tablet (25 mg total) by mouth every 6 (six) hours as needed for anxiety. 60 tablet 1  . metroNIDAZOLE (FLAGYL) 500 MG tablet Take 1 tablet (500 mg total) by mouth 2 (two) times daily. 14 tablet 0  . metroNIDAZOLE (FLAGYL) 500 MG tablet Take 1 tablet (500 mg total) by mouth 2 (two) times daily. 14 tablet 0  . nortriptyline (PAMELOR) 25 MG capsule Take 1 capsule (25 mg total) by mouth at bedtime. (Patient not taking: Reported on 08/14/2014) 30 capsule 0  . pantoprazole (PROTONIX) 40 MG tablet Take 1 tablet (40 mg total) by mouth daily. Take as soon as you wake up, 30 minutes before eating 30 tablet 3  . polyethylene glycol powder (GLYCOLAX/MIRALAX) powder Take 17 g by mouth daily. 850 g 7  . SUMAtriptan ([name]) 100 MG tablet TAKE 1 TABLET BY MOUTH ONCE AS NEEDED FOR MIGRAINE MAY REPEAT IN 2 HOURS IF HEADACHE PERSISTS OR REC 9 tablet 0  . traZODone (DESYREL) 50 MG tablet Take 1-2 tablets (50-100 mg total) by mouth at bedtime. 30 tablet 0  . Vitamin D, Ergocalciferol, (DRISDOL) 50000 UNITS CAPS capsule Take 1 capsule (50,000 Units total) by mouth  every 7 (seven) days. (Patient not taking: Reported on 08/14/2014) 12 capsule 0   No current facility-administered medications on file prior to visit.   Allergies  Allergen Reactions  . Doxycycline Hyclate Hives, Itching and Swelling    REACTION: swelling of face  . Ketorolac Other (See Comments)  . Ketorolac Tromethamine Other (See Comments)    Patient stated that it makes her extremely hyper  . Amoxicillin Swelling and Rash    REACTION: facial swelling and rash.  . Doxycycline Rash     Objective:  General:  Alert and oriented x3 in no acute distress, Obese   Dermatology: No open lesions bilateral lower extremities, no webspace macerations, no ecchymosis bilateral, all nails x 10 are well manicured.  Vascular: Dorsalis Pedis and Posterior Tibial pedal pulses 2/4, Capillary Fill Time 3 seconds,(+) pedal hair growth bilateral, no edema bilateral lower extremities, Temperature gradient within normal limits.  Neurology: Gross sensation intact via light touch bilateral,  Deep tendon reflexes within normal limits bilateral, No babinski sign present bilateral. (- )Tinels sign.   Musculoskeletal: Mild tenderness with palpation at distal mid arch and plantar medial heel on Right>Left,No pain with calf compression bilateral. Ankle and pedal joint range of motion is within normal limits, there is no 1st ray hypermobility noted bilateral, decreased 1st MPJ rom Right>Left with functional limitus and moderate Pes planus noted on weightbearing exam.Strength within normal limits in all groups bilateral.   Xrays  LEFT FOOT - COMPLETE 3+ VIEW  COMPARISON: None.  FINDINGS: Normal anatomic alignment. No evidence for acute fracture dislocation. Regional soft tissues are unremarkable. Plantar and posterior calcaneal spurring.  IMPRESSION: No acute osseous abnormality.   Right Foot    Impression: Normal osseous mineralization, heel spur present, dorsal 1st MTPJ spur present, Moderate pes planus deformity, no fracture/dislocation, soft tissues within normal limits, no foreign body.   Assessment and Plan: Problem List Items Addressed This Visit    None    Visit Diagnoses    Foot pain, unspecified laterality    -  Primary    Relevant Medications    methylPREDNISolone (MEDROL DOSEPAK) 4 MG TBPK tablet    diclofenac (VOLTAREN) 75 MG EC tablet    Other Relevant Orders    DG Foot Complete Left    DG Foot Complete Right    Pes planus of both feet        Relevant Medications    diclofenac (VOLTAREN) 75 MG  EC tablet    Fasciitis        Relevant Medications    methylPREDNISolone (MEDROL DOSEPAK) 4 MG TBPK tablet    diclofenac (VOLTAREN) 75 MG EC tablet       -Complete examination performed -Xrays reviewed; Previous left foot and new right foot xray obtained in office -Discussed treatement options for fasciitis and foot pain secondary to foot type/structure/pes planus -Patient declined injection therapy at this visit; advised patient may re-visit this with plantar fascial taping if symptoms persists  -Rx Medrol dosepack and diclofenac to start once dose pack is completed -Recommend daily icing and stretching exercises -Recommend good supportive shoes daily -Recommend consider use of OTC inserts if fails to improve condition recommend lab dispensing of orthotics -Patient to return to office in 6 weeks or sooner if condition worsens.  Landis Martins, DPM

## 2015-02-09 NOTE — Patient Instructions (Signed)
Flat Feet Having flat feet is a common condition. One foot or both might be affected. People of any age can have flat feet. In fact, everyone is born with them. But most of the time, the foot gradually develops an arch. That is the curve on the bottom of the foot that creates a gap between the foot and the ground. An arch usually develops in childhood. Sometimes, though, an arch never develops and the foot stays flat on the bottom. Other times, an arch develops but later collapses (caves in). That is what gives the condition its nickname, "fallen arches." The medical term for flat feet is pes planus. Some people have flat feet their whole life and have no problems. For others, the condition causes pain and needs to be corrected.  CAUSES   A problem with the foot's soft tissue; tendons and ligaments could be loose.  This can cause what is called flexible flat feet. That means the shape of the foot changes with pressure. When standing on the toes, a curved arch can be seen. When standing on the ground, the foot is flat.  Wear and tear. Sometimes arches simply flatten over time.  Damage to the posterior tibial tendon. This is the tendon that goes from the inside of the ankle to the bones in the middle of the foot. It is the main support for the arch. If the tendon is injured, stretched or torn, the arch might flatten.  Tarsal coalition. With this condition, two or more bones in the foot are joined together (fused ) during development in the womb. This limits movement and can lead to a flat foot. SYMPTOMS   The foot is even with the ground from toe to heel. Your caregiver will look closely at the inside of the foot while you are standing.  Pain along the bottom of the foot. Some people describe the pain as tightness.  Swelling on the inside of the foot or ankle.  Changes in the way you walk (gait).  The feet lean inward, starting at the ankle (pronation). DIAGNOSIS  To decide if a child or  adult has flat feet, a healthcare provider will probably:  Do a physical examination. This might include having the person stand on his or her toes and then stand normally. The caregiver will also hold the foot and put pressure on the foot in different directions.  Check the person's shoes. The pattern of wear on the soles can offer clues.  Order images (pictures) of the foot. They can help identify the cause of any pain. They also will show injuries to bones or tendons that could be causing the condition. The images can come from:  X-rays.  Computed tomography (CT) scan. This combines X-ray and a computer.  Magnetic resonance imaging (MRI). This uses magnets, radio waves and a computer to take a picture of the foot. It is the best technique to evaluate tendons, ligaments and muscles. TREATMENT   Flexible flat feet usually are painless. Most of the time, gait is not affected. Most children grow out of the condition. Often no treatment is needed. If there is pain, treatment options include:  Orthotics. These are inserts that go in the shoes. They add support and shape to the feet. An orthotic is custom-made from a mold of the foot.  Shoes. Not all shoes are the same. People with flat feet need arch support. However, too much can be painful. It is important to find shoes that offer the right amount  of support. Athletes, especially runners, may need to try shoes made just for people with flatter feet.  Medication. For pain, only take over-the-counter medicine for pain, discomfort, as directed by your caregiver.  Rest. If the feet start to hurt, cut back on the exercise which increases the pain. Use common sense.  For damage to the posterior tibial tendon, options include:  Orthotics. Also adding a wedge on the inside edge may help. This can relieve pressure on the tendon.  Ankle brace, boot or cast. These supports can ease the load on the tendon while it heals.  Surgery. If the tendon is  torn, it might need to be repaired.  For tarsal coalition, similar options apply:  Pain medication.  Orthotics.  A cast and crutches. This keeps weight off the foot.  Physical therapy.  Surgery to remove the bone bridge joining the two bones together. PROGNOSIS  In most people, flat feet do not cause pain or problems. People can go about their normal activities. However, if flat feet are painful, they can and should be treated. Treatment usually relieves the pain. HOME CARE INSTRUCTIONS   Take any medications prescribed by the healthcare provider. Follow the directions carefully.  Wear, or make sure a child wears, orthotics or special shoes if this was suggested. Be sure to ask how often and for how long they should be worn.  Do any exercises or therapy treatments that were suggested.  Take notes on when the pain occurs. This will help healthcare providers decide how to treat the condition.  If surgery is needed, be sure to find out if there is anything that should or should not be done before the operation. SEEK MEDICAL CARE IF:   Pain worsens in the foot or lower leg.  Pain disappears after treatment, but then returns.  Walking or simple exercise becomes difficult or causes foot pain.  Orthotics or special shoes are uncomfortable or painful.   This information is not intended to replace advice given to you by your health care provider. Make sure you discuss any questions you have with your health care provider.   Document Released: 11/07/2008 Document Revised: 04/04/2011 Document Reviewed: 07/09/2014 Elsevier Interactive Patient Education 2016 [employer]   Plantar Fasciitis With Rehab The plantar fascia is a fibrous, ligament-like, soft-tissue structure that spans the bottom of the foot. Plantar fasciitis, also called heel spur syndrome, is a condition that causes pain in the foot due to inflammation of the tissue. SYMPTOMS   Pain and tenderness on the underneath  side of the foot.  Pain that worsens with standing or walking. CAUSES  Plantar fasciitis is caused by irritation and injury to the plantar fascia on the underneath side of the foot. Common mechanisms of injury include:  Direct trauma to bottom of the foot.  Damage to a small nerve that runs under the foot where the main fascia attaches to the heel bone.  Stress placed on the plantar fascia due to bone spurs. RISK INCREASES WITH:   Activities that place stress on the plantar fascia (running, jumping, pivoting, or cutting).  Poor strength and flexibility.  Improperly fitted shoes.  Tight calf muscles.  Flat feet.  Failure to warm-up properly before activity.  Obesity. PREVENTION  Warm up and stretch properly before activity.  Allow for adequate recovery between workouts.  Maintain physical fitness:  Strength, flexibility, and endurance.  Cardiovascular fitness.  Maintain a health body weight.  Avoid stress on the plantar fascia.  Wear properly fitted shoes,  including arch supports for individuals who have flat feet. PROGNOSIS  If treated properly, then the symptoms of plantar fasciitis usually resolve without surgery. However, occasionally surgery is necessary. RELATED COMPLICATIONS   Recurrent symptoms that may result in a chronic condition.  Problems of the lower back that are caused by compensating for the injury, such as limping.  Pain or weakness of the foot during push-off following surgery.  Chronic inflammation, scarring, and partial or complete fascia tear, occurring more often from repeated injections. TREATMENT  Treatment initially involves the use of ice and medication to help reduce pain and inflammation. The use of strengthening and stretching exercises may help reduce pain with activity, especially stretches of the Achilles tendon. These exercises may be performed at home or with a therapist. Your caregiver may recommend that you use heel cups of  arch supports to help reduce stress on the plantar fascia. Occasionally, corticosteroid injections are given to reduce inflammation. If symptoms persist for greater than 6 months despite non-surgical (conservative), then surgery may be recommended.  MEDICATION   If pain medication is necessary, then nonsteroidal anti-inflammatory medications, such as aspirin and ibuprofen, or other minor pain relievers, such as acetaminophen, are often recommended.  Do not take pain medication within 7 days before surgery.  Prescription pain relievers may be given if deemed necessary by your caregiver. Use only as directed and only as much as you need.  Corticosteroid injections may be given by your caregiver. These injections should be reserved for the most serious cases, because they may only be given a certain number of times. HEAT AND COLD  Cold treatment (icing) relieves pain and reduces inflammation. Cold treatment should be applied for 10 to 15 minutes every 2 to 3 hours for inflammation and pain and immediately after any activity that aggravates your symptoms. Use ice packs or massage the area with a piece of ice (ice massage).  Heat treatment may be used prior to performing the stretching and strengthening activities prescribed by your caregiver, physical therapist, or athletic trainer. Use a heat pack or soak the injury in warm water. SEEK IMMEDIATE MEDICAL CARE IF:  Treatment seems to offer no benefit, or the condition worsens.  Any medications produce adverse side effects. EXERCISES RANGE OF MOTION (ROM) AND STRETCHING EXERCISES - Plantar Fasciitis (Heel Spur Syndrome) These exercises may help you when beginning to rehabilitate your injury. Your symptoms may resolve with or without further involvement from your physician, physical therapist or athletic trainer. While completing these exercises, remember:   Restoring tissue flexibility helps normal motion to return to the joints. This allows  healthier, less painful movement and activity.  An effective stretch should be held for at least 30 seconds.  A stretch should never be painful. You should only feel a gentle lengthening or release in the stretched tissue. RANGE OF MOTION - Toe Extension, Flexion  Sit with your right / left leg crossed over your opposite knee.  Grasp your toes and gently pull them back toward the top of your foot. You should feel a stretch on the bottom of your toes and/or foot.  Hold this stretch for __________ seconds.  Now, gently pull your toes toward the bottom of your foot. You should feel a stretch on the top of your toes and or foot.  Hold this stretch for __________ seconds. Repeat __________ times. Complete this stretch __________ times per day.  RANGE OF MOTION - Ankle Dorsiflexion, Active Assisted  Remove shoes and sit on a chair that  is preferably not on a carpeted surface.  Place right / left foot under knee. Extend your opposite leg for support.  Keeping your heel down, slide your right / left foot back toward the chair until you feel a stretch at your ankle or calf. If you do not feel a stretch, slide your bottom forward to the edge of the chair, while still keeping your heel down.  Hold this stretch for __________ seconds. Repeat __________ times. Complete this stretch __________ times per day.  STRETCH - Gastroc, Standing  Place hands on wall.  Extend right / left leg, keeping the front knee somewhat bent.  Slightly point your toes inward on your back foot.  Keeping your right / left heel on the floor and your knee straight, shift your weight toward the wall, not allowing your back to arch.  You should feel a gentle stretch in the right / left calf. Hold this position for __________ seconds. Repeat __________ times. Complete this stretch __________ times per day. STRETCH - Soleus, Standing  Place hands on wall.  Extend right / left leg, keeping the other knee somewhat  bent.  Slightly point your toes inward on your back foot.  Keep your right / left heel on the floor, bend your back knee, and slightly shift your weight over the back leg so that you feel a gentle stretch deep in your back calf.  Hold this position for __________ seconds. Repeat __________ times. Complete this stretch __________ times per day. STRETCH - Gastrocsoleus, Standing  Note: This exercise can place a lot of stress on your foot and ankle. Please complete this exercise only if specifically instructed by your caregiver.   Place the ball of your right / left foot on a step, keeping your other foot firmly on the same step.  Hold on to the wall or a rail for balance.  Slowly lift your other foot, allowing your body weight to press your heel down over the edge of the step.  You should feel a stretch in your right / left calf.  Hold this position for __________ seconds.  Repeat this exercise with a slight bend in your right / left knee. Repeat __________ times. Complete this stretch __________ times per day.  STRENGTHENING EXERCISES - Plantar Fasciitis (Heel Spur Syndrome)  These exercises may help you when beginning to rehabilitate your injury. They may resolve your symptoms with or without further involvement from your physician, physical therapist or athletic trainer. While completing these exercises, remember:   Muscles can gain both the endurance and the strength needed for everyday activities through controlled exercises.  Complete these exercises as instructed by your physician, physical therapist or athletic trainer. Progress the resistance and repetitions only as guided. STRENGTH - Towel Curls  Sit in a chair positioned on a non-carpeted surface.  Place your foot on a towel, keeping your heel on the floor.  Pull the towel toward your heel by only curling your toes. Keep your heel on the floor.  If instructed by your physician, physical therapist or athletic trainer, add  ____________________ at the end of the towel. Repeat __________ times. Complete this exercise __________ times per day. STRENGTH - Ankle Inversion  Secure one end of a rubber exercise band/tubing to a fixed object (table, pole). Loop the other end around your foot just before your toes.  Place your fists between your knees. This will focus your strengthening at your ankle.  Slowly, pull your big toe up and in, making sure  the band/tubing is positioned to resist the entire motion.  Hold this position for __________ seconds.  Have your muscles resist the band/tubing as it slowly pulls your foot back to the starting position. Repeat __________ times. Complete this exercises __________ times per day.    This information is not intended to replace advice given to you by your health care provider. Make sure you discuss any questions you have with your health care provider.   Document Released: 01/10/2005 Document Revised: 05/27/2014 Document Reviewed: 04/24/2008 Elsevier Interactive Patient Education [employer].

## 2015-02-10 ENCOUNTER — Telehealth: Payer: Self-pay

## 2015-02-10 ENCOUNTER — Other Ambulatory Visit: Payer: Self-pay | Admitting: Internal Medicine

## 2015-02-10 DIAGNOSIS — R1033 Periumbilical pain: Secondary | ICD-10-CM

## 2015-02-10 NOTE — Telephone Encounter (Signed)
-----   Message from Lance Bosch, NP sent at 02/10/2015 12:08 PM EST ----- No hernia was found

## 2015-02-10 NOTE — Telephone Encounter (Signed)
Returned call to patient and she is aware the US showed no hernia Patient wants to know it you can order a CT scan

## 2015-02-10 NOTE — Telephone Encounter (Signed)
Order placed. Please schedule

## 2015-02-10 NOTE — Telephone Encounter (Signed)
Pt. Returned call. Please f/u with pt. °

## 2015-02-10 NOTE — Telephone Encounter (Signed)
Tried to contact patient this am Patient not available  Message left on voice mail to return our call 

## 2015-02-11 ENCOUNTER — Telehealth: Payer: Self-pay

## 2015-02-11 NOTE — Telephone Encounter (Signed)
Spoke with patient this am and she is aware her CT scan has  Been scheduled for 02/17/2015 at 2pm at [facility] [place]

## 2015-02-16 ENCOUNTER — Telehealth: Payer: Self-pay | Admitting: *Deleted

## 2015-02-16 DIAGNOSIS — M79673 Pain in unspecified foot: Secondary | ICD-10-CM

## 2015-02-16 DIAGNOSIS — M729 Fibroblastic disorder, unspecified: Secondary | ICD-10-CM

## 2015-02-16 DIAGNOSIS — M2142 Flat foot [pes planus] (acquired), left foot: Secondary | ICD-10-CM

## 2015-02-16 DIAGNOSIS — M2141 Flat foot [pes planus] (acquired), right foot: Secondary | ICD-10-CM

## 2015-02-16 MED ORDER — DICLOFENAC SODIUM 75 MG PO TBEC: 75.0000 mg | DELAYED_RELEASE_TABLET | Freq: Two times a day (BID) | ORAL | Status: DC

## 2015-02-16 NOTE — Telephone Encounter (Signed)
Medicaid approval has not come in, informed pt could get on $4.00 formulary at [facility].  Pt changed to [facility] and I sent the rx for Diclofenac there.

## 2015-02-17 ENCOUNTER — Ambulatory Visit (HOSPITAL_COMMUNITY)
Admission: RE | Admit: 2015-02-17 | Discharge: 2015-02-17 | Disposition: A | Discharge status: Home / Self Care | Payer: Medicaid Other | Source: Ambulatory Visit | Attending: Internal Medicine | Admitting: Internal Medicine

## 2015-02-17 DIAGNOSIS — R1033 Periumbilical pain: Secondary | ICD-10-CM | POA: Insufficient documentation

## 2015-02-17 DIAGNOSIS — K429 Umbilical hernia without obstruction or gangrene: Secondary | ICD-10-CM | POA: Diagnosis not present

## 2015-02-18 ENCOUNTER — Telehealth: Payer: Self-pay

## 2015-02-18 DIAGNOSIS — K469 Unspecified abdominal hernia without obstruction or gangrene: Secondary | ICD-10-CM

## 2015-02-18 NOTE — Telephone Encounter (Signed)
-----   Message from Lance Bosch, NP sent at 02/18/2015  5:23 PM EST ----- Hernia noted. If it is bothering her we can send a referral to General surgery.

## 2015-02-18 NOTE — Telephone Encounter (Signed)
Spoke with patient and she is aware of her hernia Referral to general surgery placed in epic

## 2015-03-12 ENCOUNTER — Ambulatory Visit: Payer: Self-pay | Admitting: General Surgery

## 2015-03-12 NOTE — H&P (Signed)
History of Present Illness Amanda Ok MD; 03/10/2015 3:57 PM) The patient is a 35 year old female who presents with an umbilical hernia. Patient is a 35 year old female who is referred by Amanda Manning, NP for evaluation of an umbilical hernia. Patient states that she is at this hernia there for multiple years. She states that she's had more and more pain this time is gone. Patient has undergone a CT scan which reveals a umbilical hernia.     Other Problems Amanda [name], CMA; 03/10/2015 3:27 PM) Anxiety Disorder Asthma Back Pain Gastroesophageal Reflux Disease Heart murmur Migraine Headache  Past Surgical History Amanda [name], CMA; 03/10/2015 3:27 PM) Cesarean Section - 1 Colon Polyp Removal - Colonoscopy  Diagnostic Studies History Amanda [name], CMA; 03/10/2015 3:27 PM) Colonoscopy 5-10 years ago Mammogram within last year Pap Smear 1-5 years ago  Allergies Amanda [name], CMA; 03/10/2015 3:28 PM) Doxycycline Hyclate *Tetracyclines** Ketorolac *ANALGESICS - ANTI-INFLAMMATORY* Amoxicillin ER *PENICILLINS*  Medication History Amanda [name], CMA; 03/10/2015 3:28 PM) [name] (180MG  Tablet, Oral) Active. [name] (100MG  Tablet, Oral) Active. Cyclobenzaprine HCl (10MG  Tablet, Oral) Active. Medications Reconciled  Social History Amanda [name], [place]; 03/10/2015 3:27 PM) No alcohol use No caffeine use No drug use Tobacco use Never smoker.  Family History Amanda [name], [place]; 03/10/2015 3:27 PM) Alcohol Abuse Father. Depression Father. Heart disease in female family member before age 40 Hypertension Mother.  Pregnancy / Birth History Amanda [name], [place]; 03/10/2015 3:27 PM) Age at menarche 24 years. Gravida 2 Maternal age 70-25 Para 1 Regular periods    Review of Systems Amanda [name] CMA; 03/10/2015 3:27 PM) General Not Present- Appetite Loss, Chills, Fatigue, Fever, Night Sweats, Weight Gain and Weight Loss. Skin Not Present- Change in Wart/Mole,  Dryness, Hives, Jaundice, New Lesions, Non-Healing Wounds, Rash and Ulcer. HEENT Not Present- Earache, Hearing Loss, Hoarseness, Nose Bleed, Oral Ulcers, Ringing in the Ears, Seasonal Allergies, Sinus Pain, Sore Throat, Visual Disturbances, Wears glasses/contact lenses and Yellow Eyes. Respiratory Not Present- Bloody sputum, Chronic Cough, Difficulty Breathing, Snoring and Wheezing. Breast Not Present- Breast Mass, Breast Pain, Nipple Discharge and Skin Changes. Cardiovascular Not Present- Chest Pain, Difficulty Breathing Lying Down, Leg Cramps, Palpitations, Rapid Heart Rate, Shortness of Breath and Swelling of Extremities. Gastrointestinal Present- Abdominal Pain and Nausea. Not Present- Bloating, Bloody Stool, Change in Bowel Habits, Chronic diarrhea, Constipation, Difficulty Swallowing, Excessive gas, Gets full quickly at meals, Hemorrhoids, Indigestion, Rectal Pain and Vomiting. Female Genitourinary Not Present- Frequency, Nocturia, Painful Urination, Pelvic Pain and Urgency. Neurological Present- Headaches. Not Present- Decreased Memory, Fainting, Numbness, Seizures, Tingling, Tremor, Trouble walking and Weakness. Psychiatric Not Present- Anxiety, Bipolar, Change in Sleep Pattern, Depression, Fearful and Frequent crying. Endocrine Not Present- Cold Intolerance, Excessive Hunger, Hair Changes, Heat Intolerance, Hot flashes and New Diabetes. Hematology Not Present- Easy Bruising, Excessive bleeding, Gland problems, HIV and Persistent Infections.  Vitals Amanda [name] CMA; 03/10/2015 3:29 PM) 03/10/2015 3:28 PM Weight: 271 lb Height: 63in Body Surface Area: 2.2 m Body Mass Index: 48.01 kg/m  Temp.: 97.27F(Tympanic)  Pulse: 77 (Regular)  BP: 130/82 (Sitting, Left Arm, Standard)       Physical Exam Amanda Ok MD; 03/10/2015 3:54 PM) General Mental Status-Alert. General Appearance-Consistent with stated age. Hydration-Well hydrated. Voice-Normal.  Head and  Neck Head-normocephalic, atraumatic with no lesions or palpable masses. Trachea-midline. Thyroid Gland Characteristics - normal size and consistency.  Chest and Lung Exam Chest and lung exam reveals -quiet, even and easy respiratory effort with no use of accessory muscles and on auscultation, normal breath sounds, no  adventitious sounds and normal vocal resonance. Inspection Chest Wall - Normal. Back - normal.  Cardiovascular Cardiovascular examination reveals -normal heart sounds, regular rate and rhythm with no murmurs and normal pedal pulses bilaterally.  Abdomen Inspection Skin - Scar - no surgical scars. Hernias - Umbilical hernia - Incarcerated(2 cm). Palpation/Percussion Normal exam - Soft, Non Tender, No Rebound tenderness, No Rigidity (guarding) and No hepatosplenomegaly. Auscultation Normal exam - Bowel sounds normal.    Assessment & Plan Amanda Ok MD; [id number] PM) UMBILICAL HERNIA WITHOUT OBSTRUCTION AND WITHOUT GANGRENE (K42.9) Impression: 35 year old female with umbilical hernia.  1. The patient will like to proceed to the operating room for laparoscopic umbilical hernia repair with mesh.  2. I discussed with the patient the signs and symptoms of incarceration and strangulation and the need to proceed to the ER should they occur.  3. I discussed with the patient the risks and benefits of the procedure to include but not limited to: Infection, bleeding, damage to surrounding structures, possible need for further surgery, possible nerve pain, and possible recurrence. The patient was understanding and wishes to proceed.

## 2015-03-16 ENCOUNTER — Encounter: Payer: Self-pay | Admitting: Sports Medicine

## 2015-03-16 ENCOUNTER — Ambulatory Visit (INDEPENDENT_AMBULATORY_CARE_PROVIDER_SITE_OTHER): Payer: Medicaid Other | Admitting: Sports Medicine

## 2015-03-16 DIAGNOSIS — M2140 Flat foot [pes planus] (acquired), unspecified foot: Secondary | ICD-10-CM

## 2015-03-16 DIAGNOSIS — M2141 Flat foot [pes planus] (acquired), right foot: Secondary | ICD-10-CM

## 2015-03-16 DIAGNOSIS — M79673 Pain in unspecified foot: Secondary | ICD-10-CM

## 2015-03-16 DIAGNOSIS — M2142 Flat foot [pes planus] (acquired), left foot: Secondary | ICD-10-CM

## 2015-03-16 DIAGNOSIS — M729 Fibroblastic disorder, unspecified: Secondary | ICD-10-CM

## 2015-03-16 NOTE — Progress Notes (Signed)
Patient ID: Amanda [name], female   DOB: 02/23/1980, 35 y.o.   MRN: [id number] Subjective: Amanda [name] is a 35 y.o. female patient who returns to office for evaluation of Right> Left foot pain. Patient states that she continues to have pain in Right>Left foot at arch that is ocassional in nature; states that she did not notice any improvement while on medication; states that she has been stretching and icing with a little improvement but still have pain when she has done a lot of walking; has not purchased OTC inserts yet. Patient denies any other pedal complaints.   Patient Active Problem List   Diagnosis Date Noted  . Murmur 06/20/2012  . INSOMNIA 08/14/2009  . DYSMENORRHEA 12/09/2008  . SINUSITIS, ACUTE 05/30/2008  . BREAST PAIN, BILATERAL 03/25/2008  . FATIGUE 03/25/2008  . CANDIDIASIS 01/02/2008  . DEPRESSION/ANXIETY 09/11/2007  . HEADACHE 09/11/2007  . PALPITATIONS 02/23/2007  . CHEST PAIN 02/23/2007  . ABDOMINAL PAIN, EPIGASTRIC 02/23/2007  . PAP SMEAR, ABNORMAL 10/09/2006  . GONORRHEA, ACUTE 10/06/2006  . OBESITY 10/06/2006  . ALLERGIC RHINITIS 10/06/2006  . ASTHMA 10/06/2006  . GERD 10/06/2006   Current Outpatient Prescriptions on File Prior to Visit  Medication Sig Dispense Refill  . acetaminophen-codeine (TYLENOL #3) 300-30 MG tablet Take 1-2 tablets by mouth every 6 (six) hours as needed for moderate pain. 15 tablet 0  . albuterol (PROVENTIL HFA;VENTOLIN HFA) 108 (90 BASE) MCG/ACT inhaler Inhale 2 puffs into your lungs q 4h prn cough and wheeze 1 Inhaler 0  . cyclobenzaprine (FLEXERIL) 10 MG tablet May use every 8 hours for headache (Patient taking differently: Take 10 mg by mouth every 8 (eight) hours as needed (Headache). May use every 8 hours for headache) 60 tablet 4  . diclofenac (VOLTAREN) 75 MG EC tablet Take 1 tablet (75 mg total) by mouth 2 (two) times daily. 60 tablet 0  . docusate sodium (COLACE) 100 MG capsule Take 1 capsule (100 mg total) by  mouth at bedtime. 30 capsule 5  . fexofenadine (ALLEGRA) 180 MG tablet Take 1 tablet (180 mg total) by mouth daily. 30 tablet 2  . fluticasone (FLONASE) 50 MCG/ACT nasal spray Place 2 sprays into both nostrils daily. 16 g 3  . hydrOXYzine (ATARAX/VISTARIL) 25 MG tablet Take 1 tablet (25 mg total) by mouth every 6 (six) hours as needed for anxiety. 60 tablet 1  . ibuprofen (ADVIL,MOTRIN) 200 MG tablet Take 400 mg by mouth every 6 (six) hours as needed (Pain).    . methylPREDNISolone (MEDROL DOSEPAK) 4 MG TBPK tablet Take as instructed 21 tablet 0  . metroNIDAZOLE (FLAGYL) 500 MG tablet Take 1 tablet (500 mg total) by mouth 2 (two) times daily. 14 tablet 0  . metroNIDAZOLE (FLAGYL) 500 MG tablet Take 1 tablet (500 mg total) by mouth 2 (two) times daily. 14 tablet 0  . nortriptyline (PAMELOR) 25 MG capsule Take 1 capsule (25 mg total) by mouth at bedtime. 30 capsule 0  . pantoprazole (PROTONIX) 40 MG tablet Take 1 tablet (40 mg total) by mouth daily. Take as soon as you wake up, 30 minutes before eating 30 tablet 3  . polyethylene glycol powder (GLYCOLAX/MIRALAX) powder Take 17 g by mouth daily. 850 g 7  . SUMAtriptan ([name]) 100 MG tablet TAKE 1 TABLET BY MOUTH ONCE AS NEEDED FOR MIGRAINE MAY REPEAT IN 2 HOURS IF HEADACHE PERSISTS OR REC 9 tablet 0  . traZODone (DESYREL) 50 MG tablet Take 1-2 tablets (50-100 mg total) by mouth at  bedtime. 30 tablet 0  . Vitamin D, Ergocalciferol, (DRISDOL) 50000 UNITS CAPS capsule Take 1 capsule (50,000 Units total) by mouth every 7 (seven) days. 12 capsule 0   No current facility-administered medications on file prior to visit.   Allergies  Allergen Reactions  . Doxycycline Hyclate Hives, Itching and Swelling    REACTION: swelling of face  . Ketorolac Other (See Comments)  . Ketorolac Tromethamine Other (See Comments)    Patient stated that it makes her extremely hyper  . Amoxicillin Swelling and Rash    Has patient had a PCN reaction causing immediate  rash, facial/tongue/throat swelling, SOB or lightheadedness with hypotension: Unsure Has patient had a PCN reaction causing severe rash involving mucus membranes or skin necrosis: Unsure Has patient had a PCN reaction that required hospitalization No Has patient had a PCN reaction occurring within the last 10 years: No If all of the above answers are "NO", then may proceed with Cephalosporin use.REACTION: facial swelling and rash.  . Doxycycline Rash     Objective:  General: Alert and oriented x3 in no acute distress, Obese   Dermatology: No open lesions bilateral lower extremities, no webspace macerations, no ecchymosis bilateral, all nails x 10 are well manicured.  Vascular: Dorsalis Pedis and Posterior Tibial pedal pulses 2/4, Capillary Fill Time 3 seconds,(+) pedal hair growth bilateral, no edema bilateral lower extremities, Temperature gradient within normal limits.  Neurology: Gross sensation intact via light touch bilateral,  Deep tendon reflexes within normal limits bilateral, No babinski sign present bilateral. (- )Tinels sign.   Musculoskeletal: Minimal tenderness with palpation at distal mid arch and plantar medial heel on Right>Left,No pain with calf compression bilateral. Ankle and pedal joint range of motion is within normal limits, there is no 1st ray hypermobility noted bilateral, decreased 1st MPJ rom Right>Left with functional limitus and moderate Pes planus noted on weightbearing exam.Strength within normal limits in all groups bilateral.    Assessment and Plan: Problem List Items Addressed This Visit    None    Visit Diagnoses    Foot pain, unspecified laterality    -  Primary    Pes planus of both feet        Fasciitis           -Complete examination performed -Discussed treatement options for fasciitis and occassional foot pain secondary to foot type/structure/pes planus -Patient declined injection therapy at this visit; advised patient may re-visit this with  plantar fascial taping if symptoms persists  -Gave patient offloading padding for arches and instructed on use -Recommend daily icing and stretching exercises -Recommend good supportive shoes daily -Recommend patient to get OTC inserts if fails to improve condition recommend lab dispensing of orthotics -Patient to return to office as needed or sooner if condition worsens.  Landis Martins, DPM

## 2015-03-18 ENCOUNTER — Inpatient Hospital Stay (HOSPITAL_COMMUNITY): Admission: RE | Admit: 2015-03-18 | Payer: Medicaid Other | Source: Ambulatory Visit

## 2015-03-19 ENCOUNTER — Ambulatory Visit (INDEPENDENT_AMBULATORY_CARE_PROVIDER_SITE_OTHER): Payer: Medicaid Other | Admitting: Neurology

## 2015-03-19 ENCOUNTER — Encounter: Payer: Self-pay | Admitting: Neurology

## 2015-03-19 VITALS — BP 112/68 | HR 105 | Ht 63.0 in | Wt 268.0 lb

## 2015-03-19 DIAGNOSIS — G43719 Chronic migraine without aura, intractable, without status migrainosus: Secondary | ICD-10-CM | POA: Diagnosis not present

## 2015-03-19 MED ORDER — ATENOLOL 25 MG PO TABS: ORAL_TABLET | ORAL | Status: DC

## 2015-03-19 MED ORDER — RIZATRIPTAN BENZOATE 10 MG PO TBDP: ORAL_TABLET | ORAL | Status: DC

## 2015-03-19 NOTE — Patient Instructions (Signed)
Migraine Recommendations: 1.  Start atenolol 25mg  tablets.  Take 1 tablet daily for 7 days, then increase to 2 tablets daily.  Call in 4 weeks with update and we can adjust dose if needed. 2.  Take rizatriptan 10mg  at earliest onset of headache.  May repeat dose once in 2 hours if needed.  Do not exceed two tablets in 24 hours. 3.  Limit use of pain relievers to no more than 2 days out of the week.  These medications include acetaminophen, ibuprofen, triptans and narcotics.  This will help reduce risk of rebound headaches. 4.  Be aware of common food triggers such as processed sweets, processed foods with nitrites (such as deli meat, hot dogs, sausages), foods with MSG, alcohol (such as wine), chocolate, certain cheeses, certain fruits (dried fruits, some citrus fruit), vinegar, diet soda. 4.  Avoid caffeine 5.  Routine exercise 6.  Proper sleep hygiene 7.  Stay adequately hydrated with water 8.  Keep a headache diary. 9.  Maintain proper stress management. 10.  Do not skip meals.  Proper diet.  Weight loss 11.  Consider supplements:  Magnesium oxide 400mg  to 600mg  daily, riboflavin 400mg , Coenzyme Q 10 100mg  three times daily 12.  Follow up in 3 months.

## 2015-03-19 NOTE — Progress Notes (Signed)
NEUROLOGY FOLLOW UP OFFICE NOTE  [name] [id number]  HISTORY OF PRESENT ILLNESS: Amanda [name] is a 35 year old right-handed woman with asthma, GERD, depression and morbid obesity who follows up for chronic migraine.  UPDATE: Intensity:  6-10/10 Duration: all day Frequency:  20 headache days per month Current medication:  Flexeril as needed.  Also ice packs Sumatriptan was ineffective and made her feel ill Nortriptyline 25mg  made her feel drained.  HISTORY: Onset:  adolescence Location:  Holocephalic from neck and shoulders to behind the eyes Quality:  Throbbing, pressure Intensity:  10/10 Aura:  no Prodrome:  no Associated symptoms:  Dizziness, nausea, photophobia, phonophobia Duration:  constant Frequency:  Daily (16-20 days severe) Triggers/exacerbating factors:  Stress, getting upset Relieving factors:  none Activity:  Difficult to function when severe  Past abortive therapy:  BC, Advil, Tylenol, Excedrin, Aleve, Sumatriptan 100mg  Past preventative therapy:  Trigger point injections, Zoloft (for depression), topamax 100mg  (ineffective), nortriptyline 25mg  (side effects)  TSH last month was 0.598.  Caffeine:  no Alcohol:  no Smoker:  no Diet:  Cut out pork, cheese, chocolate.  Increased water intake Exercise:  walking Depression/stress:  stress Sleep hygiene:  poor Family history of headache:  no  PAST MEDICAL HISTORY: Past Medical History  Diagnosis Date  . Asthma   . GERD (gastroesophageal reflux disease)   . Headache(784.0)   . Anxiety   . Environmental allergies   . Acid reflux   . H/O hiatal hernia   . Depression   . Heart murmur   . Allergy   . Genital warts   . Constipation   . MRSA cellulitis     MEDICATIONS: Current Outpatient Prescriptions on File Prior to Visit  Medication Sig Dispense Refill  . albuterol (PROVENTIL HFA;VENTOLIN HFA) 108 (90 BASE) MCG/ACT inhaler Inhale 2 puffs into your lungs q 4h prn cough and  wheeze 1 Inhaler 0  . cyclobenzaprine (FLEXERIL) 10 MG tablet May use every 8 hours for headache (Patient taking differently: Take 10 mg by mouth every 8 (eight) hours as needed (Headache). May use every 8 hours for headache) 60 tablet 4  . acetaminophen-codeine (TYLENOL #3) 300-30 MG tablet Take 1-2 tablets by mouth every 6 (six) hours as needed for moderate pain. (Patient not taking: Reported on 03/19/2015) 15 tablet 0  . diclofenac (VOLTAREN) 75 MG EC tablet Take 1 tablet (75 mg total) by mouth 2 (two) times daily. (Patient not taking: Reported on 03/19/2015) 60 tablet 0  . docusate sodium (COLACE) 100 MG capsule Take 1 capsule (100 mg total) by mouth at bedtime. (Patient not taking: Reported on 03/19/2015) 30 capsule 5  . fexofenadine (ALLEGRA) 180 MG tablet Take 1 tablet (180 mg total) by mouth daily. (Patient not taking: Reported on 03/19/2015) 30 tablet 2  . fluticasone (FLONASE) 50 MCG/ACT nasal spray Place 2 sprays into both nostrils daily. (Patient not taking: Reported on 03/19/2015) 16 g 3  . hydrOXYzine (ATARAX/VISTARIL) 25 MG tablet Take 1 tablet (25 mg total) by mouth every 6 (six) hours as needed for anxiety. (Patient not taking: Reported on 03/19/2015) 60 tablet 1  . ibuprofen (ADVIL,MOTRIN) 200 MG tablet Take 400 mg by mouth every 6 (six) hours as needed (Pain). Reported on 03/19/2015    . methylPREDNISolone (MEDROL DOSEPAK) 4 MG TBPK tablet Take as instructed (Patient not taking: Reported on 03/19/2015) 21 tablet 0  . metroNIDAZOLE (FLAGYL) 500 MG tablet Take 1 tablet (500 mg total) by mouth 2 (two) times daily. (Patient not  taking: Reported on 03/19/2015) 14 tablet 0  . metroNIDAZOLE (FLAGYL) 500 MG tablet Take 1 tablet (500 mg total) by mouth 2 (two) times daily. (Patient not taking: Reported on 03/19/2015) 14 tablet 0  . nortriptyline (PAMELOR) 25 MG capsule Take 1 capsule (25 mg total) by mouth at bedtime. (Patient not taking: Reported on 03/19/2015) 30 capsule 0  . pantoprazole (PROTONIX) 40  MG tablet Take 1 tablet (40 mg total) by mouth daily. Take as soon as you wake up, 30 minutes before eating (Patient not taking: Reported on 03/19/2015) 30 tablet 3  . polyethylene glycol powder (GLYCOLAX/MIRALAX) powder Take 17 g by mouth daily. (Patient not taking: Reported on 03/19/2015) 850 g 7  . SUMAtriptan ([name]) 100 MG tablet TAKE 1 TABLET BY MOUTH ONCE AS NEEDED FOR MIGRAINE MAY REPEAT IN 2 HOURS IF HEADACHE PERSISTS OR REC (Patient not taking: Reported on 03/19/2015) 9 tablet 0  . traZODone (DESYREL) 50 MG tablet Take 1-2 tablets (50-100 mg total) by mouth at bedtime. (Patient not taking: Reported on 03/19/2015) 30 tablet 0  . Vitamin D, Ergocalciferol, (DRISDOL) 50000 UNITS CAPS capsule Take 1 capsule (50,000 Units total) by mouth every 7 (seven) days. (Patient not taking: Reported on 03/19/2015) 12 capsule 0   No current facility-administered medications on file prior to visit.    ALLERGIES: Allergies  Allergen Reactions  . Doxycycline Hyclate Hives, Itching and Swelling    REACTION: swelling of face  . Ketorolac Other (See Comments)  . Ketorolac Tromethamine Other (See Comments)    Patient stated that it makes her extremely hyper  . Amoxicillin Swelling and Rash    Has patient had a PCN reaction causing immediate rash, facial/tongue/throat swelling, SOB or lightheadedness with hypotension: Unsure Has patient had a PCN reaction causing severe rash involving mucus membranes or skin necrosis: Unsure Has patient had a PCN reaction that required hospitalization No Has patient had a PCN reaction occurring within the last 10 years: No If all of the above answers are "NO", then may proceed with Cephalosporin use.REACTION: facial swelling and rash.  . Doxycycline Rash    FAMILY HISTORY: Family History  Problem Relation Age of Onset  . Diabetes Mother   . Hypertension Mother   . Depression Mother   . Depression Father   . Anesthesia problems Neg Hx   . Breast cancer Maternal Aunt       [employer]  . Heart disease Maternal Grandmother   . Diabetes Maternal Grandfather   . Heart disease Maternal Grandfather   . Hypertension Maternal Grandfather     SOCIAL HISTORY: Social History   Social History  . Marital Status: Single    Spouse Name: N/A  . Number of Children: 1  . Years of Education: 12+   Occupational History  . Not on file.   Social History Main Topics  . Smoking status: Never Smoker   . Smokeless tobacco: Never Used  . Alcohol Use: No  . Drug Use: No  . Sexual Activity:    Partners: Male    Birth Control/ Protection: None     Comment: last intercourse last night   Other Topics Concern  . Not on file   Social History Narrative   Regular exercise-no   Caffeine Use-yes    REVIEW OF SYSTEMS: Constitutional: No fevers, chills, or sweats, no generalized fatigue, change in appetite Eyes: No visual changes, double vision, eye pain Ear, nose and throat: No hearing loss, ear pain, nasal congestion, sore throat Cardiovascular: No chest  pain, palpitations Respiratory:  No shortness of breath at rest or with exertion, wheezes GastrointestinaI: No nausea, vomiting, diarrhea, abdominal pain, fecal incontinence Genitourinary:  No dysuria, urinary retention or frequency Musculoskeletal:  No neck pain, back pain Integumentary: No rash, pruritus, skin lesions Neurological: as above Psychiatric: No depression, insomnia, anxiety Endocrine: No palpitations, fatigue, diaphoresis, mood swings, change in appetite, change in weight, increased thirst Hematologic/Lymphatic:  No anemia, purpura, petechiae. Allergic/Immunologic: no itchy/runny eyes, nasal congestion, recent allergic reactions, rashes  PHYSICAL EXAM: Filed Vitals:   03/19/15 1554  BP: 112/68  Pulse: 105   General: No acute distress.  Patient appears well-groomed.  Head:  Normocephalic/atraumatic Eyes:  Fundoscopic exam unremarkable without vessel changes, exudates, hemorrhages or  papilledema. Neck: supple, no paraspinal tenderness, full range of motion Heart:  Regular rate and rhythm Lungs:  Clear to auscultation bilaterally Back: No paraspinal tenderness Neurological Exam: alert and oriented to person, place, and time. Attention span and concentration intact, recent and remote memory intact, fund of knowledge intact.  Speech fluent and not dysarthric, language intact.  CN II-XII intact. Fundoscopic exam unremarkable without vessel changes, exudates, hemorrhages or papilledema.  Bulk and tone normal, muscle strength 5/5 throughout.  Sensation to light touch, temperature and vibration intact.  Deep tendon reflexes 2+ throughout, toes downgoing.  Finger to nose and heel to shin testing intact.  Gait normal, Romberg negative.  IMPRESSION: Chronic migraine Morbid obesity  PLAN: 1.  Start atenolol 25mg  daily for 7 days and increase to 50mg  daily 2.  maxalt 10mg  for abortive therapy 3.  Flexeril as needed 4.  Weight loss 5.  Follow up in 3 months but call in 4 weeks with update.  15 minutes spent face to face with patient, over 50% spent discussing management.  Metta Clines, DO  CC:  [name]

## 2015-04-28 NOTE — Progress Notes (Signed)
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  She stated she will be on vacation next week and cant come, and then has to have a 200 appt!  Denies any open sores (previous mrsa)

## 2015-05-11 NOTE — Patient Instructions (Addendum)
Amanda [name]  05/11/2015   Your procedure is scheduled on: 05-15-15  Report to [facility]  Entrance take [facility]  elevators to 3rd floor to  [place] at 5:30 AM.  Call this number if you have problems the morning of surgery [phone]   Remember: ONLY 1 PERSON MAY GO WITH YOU TO SHORT STAY TO GET  READY MORNING OF [place].  Do not eat food or drink liquids :After Midnight.     Take these medicines the morning of surgery with A SIP OF WATER:  Cyclobenzaprine (flexeril) DO NOT TAKE ANY DIABETIC MEDICATIONS DAY OF YOUR SURGERY                               You may not have any metal on your body including hair pins and              piercings  Do not wear jewelry, make-up, lotions, powders or perfumes, deodorant             Do not wear nail polish.  Do not shave  48 hours prior to surgery.               Do not bring valuables to the hospital. [place].  Contacts, dentures or bridgework may not be worn into surgery.      Patients discharged the day of surgery will not be allowed to drive home.  Name and phone number of your driver:  Special Instructions: coughing and deep breathing exercises, leg exercises              Please read over the following fact sheets you were given: _____________________________________________________________________             [facility] - Preparing for Surgery Before surgery, you can play an important role.  Because skin is not sterile, your skin needs to be as free of germs as possible.  You can reduce the number of germs on your skin by washing with CHG (chlorahexidine gluconate) soap before surgery.  CHG is an antiseptic cleaner which kills germs and bonds with the skin to continue killing germs even after washing. Please DO NOT use if you have an allergy to CHG or antibacterial soaps.  If your skin becomes reddened/irritated stop using the CHG  and inform your nurse when you arrive at Short Stay. Do not shave (including legs and underarms) for at least 48 hours prior to the first CHG shower.  You may shave your face/neck. Please follow these instructions carefully:  1.  Shower with CHG Soap the night before surgery and the  morning of Surgery.  2.  If you choose to wash your hair, wash your hair first as usual with your  normal  shampoo.  3.  After you shampoo, rinse your hair and body thoroughly to remove the  shampoo.                           4.  Use CHG as you would any other liquid soap.  You can apply chg directly  to the skin and wash  Gently with a scrungie or clean washcloth.  5.  Apply the CHG Soap to your body ONLY FROM THE NECK DOWN.   Do not use on face/ open                           Wound or open sores. Avoid contact with eyes, ears mouth and genitals (private parts).                       Wash face,  Genitals (private parts) with your normal soap.             6.  Wash thoroughly, paying special attention to the area where your surgery  will be performed.  7.  Thoroughly rinse your body with warm water from the neck down.  8.  DO NOT shower/wash with your normal soap after using and rinsing off  the CHG Soap.                9.  Pat yourself dry with a clean towel.            10.  Wear clean pajamas.            11.  Place clean sheets on your bed the night of your first shower and do not  sleep with pets. Day of Surgery : Do not apply any lotions/deodorants the morning of surgery.  Please wear clean clothes to the hospital/surgery center.  FAILURE TO FOLLOW THESE INSTRUCTIONS MAY RESULT IN THE CANCELLATION OF YOUR SURGERY PATIENT SIGNATURE_________________________________  NURSE SIGNATURE__________________________________  ________________________________________________________________________

## 2015-05-12 ENCOUNTER — Encounter (HOSPITAL_COMMUNITY)
Admission: RE | Admit: 2015-05-12 | Discharge: 2015-05-12 | Disposition: A | Discharge status: Home / Self Care | Payer: Medicaid Other | Source: Ambulatory Visit | Attending: General Surgery | Admitting: General Surgery

## 2015-05-12 ENCOUNTER — Encounter (HOSPITAL_COMMUNITY): Payer: Self-pay

## 2015-05-12 DIAGNOSIS — Z01818 Encounter for other preprocedural examination: Secondary | ICD-10-CM | POA: Insufficient documentation

## 2015-05-12 HISTORY — DX: Angina pectoris, unspecified: I20.9

## 2015-05-12 HISTORY — DX: Cardiac arrhythmia, unspecified: I49.9

## 2015-05-12 LAB — BASIC METABOLIC PANEL
Anion gap: 8 (ref 5–15)
BUN: 13 mg/dL (ref 6–20)
CALCIUM: 9.3 mg/dL (ref 8.9–10.3)
CHLORIDE: 109 mmol/L (ref 101–111)
CO2: 26 mmol/L (ref 22–32)
CREATININE: 0.76 mg/dL (ref 0.44–1.00)
GFR calc non Af Amer: >60 mL/min (ref >60)
GLUCOSE: 87 mg/dL (ref 65–99)
Potassium: 4.3 mmol/L (ref 3.5–5.1)
Sodium: 143 mmol/L (ref 135–145)

## 2015-05-12 LAB — CBC
HCT: 35.3 % — ABNORMAL LOW (ref 36.0–46.0)
Hemoglobin: 11.7 g/dL — ABNORMAL LOW (ref 12.0–15.0)
MCH: 25.8 pg — AB (ref 26.0–34.0)
MCHC: 33.1 g/dL (ref 30.0–36.0)
MCV: 77.8 fL — AB (ref 78.0–100.0)
PLATELETS: 238 10*3/uL (ref 150–400)
RBC: 4.54 MIL/uL (ref 3.87–5.11)
RDW: 15.2 % (ref 11.5–15.5)
WBC: 8.9 10*3/uL (ref 4.0–10.5)

## 2015-05-12 LAB — HCG, SERUM, QUALITATIVE: PREG SERUM: NEGATIVE

## 2015-05-12 NOTE — Progress Notes (Signed)
03-19-15 - LOV - Dr. Tomi Likens (neuro) - EPIC 02-17-15 - CT ABD - EPIC 01-21-15 - LOV Roney Jaffe, NP (int.med.) - EPIC 07-02-12 - Ech0 - EPIC

## 2015-05-15 ENCOUNTER — Encounter (HOSPITAL_COMMUNITY): Payer: Self-pay | Admitting: Certified Registered Nurse Anesthetist

## 2015-05-15 ENCOUNTER — Ambulatory Visit (HOSPITAL_COMMUNITY)
Admission: RE | Admit: 2015-05-15 | Discharge: 2015-05-15 | Disposition: A | Discharge status: Home / Self Care | Payer: Medicaid Other | Source: Ambulatory Visit | Attending: General Surgery | Admitting: General Surgery

## 2015-05-15 ENCOUNTER — Ambulatory Visit (HOSPITAL_COMMUNITY): Payer: Medicaid Other | Admitting: Certified Registered Nurse Anesthetist

## 2015-05-15 ENCOUNTER — Encounter (HOSPITAL_COMMUNITY)
Admission: RE | Disposition: A | Discharge status: Home / Self Care | Payer: Self-pay | Source: Ambulatory Visit | Attending: General Surgery

## 2015-05-15 DIAGNOSIS — K420 Umbilical hernia with obstruction, without gangrene: Secondary | ICD-10-CM | POA: Diagnosis not present

## 2015-05-15 DIAGNOSIS — G43909 Migraine, unspecified, not intractable, without status migrainosus: Secondary | ICD-10-CM | POA: Insufficient documentation

## 2015-05-15 DIAGNOSIS — K429 Umbilical hernia without obstruction or gangrene: Secondary | ICD-10-CM | POA: Diagnosis present

## 2015-05-15 DIAGNOSIS — Z6841 Body Mass Index (BMI) 40.0 and over, adult: Secondary | ICD-10-CM | POA: Diagnosis not present

## 2015-05-15 DIAGNOSIS — Z79899 Other long term (current) drug therapy: Secondary | ICD-10-CM | POA: Insufficient documentation

## 2015-05-15 HISTORY — PX: UMBILICAL HERNIA REPAIR: SHX196

## 2015-05-15 HISTORY — PX: INSERTION OF MESH: SHX5868

## 2015-05-15 SURGERY — REPAIR, HERNIA, UMBILICAL, LAPAROSCOPIC
Anesthesia: General | Site: Abdomen

## 2015-05-15 MED ORDER — PROPOFOL 10 MG/ML IV BOLUS
INTRAVENOUS | Status: AC
  Filled 2015-05-15: qty 20

## 2015-05-15 MED ORDER — LIP MEDEX EX OINT
TOPICAL_OINTMENT | CUTANEOUS | Status: AC
  Filled 2015-05-15: qty 7

## 2015-05-15 MED ORDER — DEXTROSE 5 % IV SOLN
2.0000 g | INTRAVENOUS | Status: AC
  Administered 2015-05-15: 1 g via INTRAVENOUS
  Administered 2015-05-15: 2 g via INTRAVENOUS

## 2015-05-15 MED ORDER — ACETAMINOPHEN 10 MG/ML IV SOLN
1000.0000 mg | Freq: Once | INTRAVENOUS | Status: AC
  Administered 2015-05-15: 1000 mg via INTRAVENOUS

## 2015-05-15 MED ORDER — BUPIVACAINE-EPINEPHRINE 0.25% -1:200000 IJ SOLN
INTRAMUSCULAR | Status: AC
  Filled 2015-05-15: qty 1

## 2015-05-15 MED ORDER — GLYCOPYRROLATE 0.2 MG/ML IJ SOLN
INTRAMUSCULAR | Status: DC | PRN
  Administered 2015-05-15: .8 mg via INTRAVENOUS

## 2015-05-15 MED ORDER — SODIUM CHLORIDE 0.9 % IJ SOLN
INTRAMUSCULAR | Status: AC
  Filled 2015-05-15: qty 10

## 2015-05-15 MED ORDER — DEXAMETHASONE SODIUM PHOSPHATE 10 MG/ML IJ SOLN
INTRAMUSCULAR | Status: AC
  Filled 2015-05-15: qty 1

## 2015-05-15 MED ORDER — SODIUM CHLORIDE 0.9% FLUSH: 3.0000 mL | Freq: Two times a day (BID) | INTRAVENOUS | Status: DC

## 2015-05-15 MED ORDER — MORPHINE SULFATE (PF) 10 MG/ML IV SOLN: 2.0000 mg | INTRAVENOUS | Status: DC | PRN

## 2015-05-15 MED ORDER — NEOSTIGMINE METHYLSULFATE 10 MG/10ML IV SOLN
INTRAVENOUS | Status: AC
  Filled 2015-05-15: qty 1

## 2015-05-15 MED ORDER — PROPOFOL 10 MG/ML IV BOLUS
INTRAVENOUS | Status: DC | PRN
  Administered 2015-05-15: 200 mg via INTRAVENOUS

## 2015-05-15 MED ORDER — ACETAMINOPHEN 325 MG PO TABS: 650.0000 mg | ORAL_TABLET | ORAL | Status: DC | PRN

## 2015-05-15 MED ORDER — MIDAZOLAM HCL 5 MG/5ML IJ SOLN
INTRAMUSCULAR | Status: DC | PRN
  Administered 2015-05-15: 2 mg via INTRAVENOUS

## 2015-05-15 MED ORDER — CEFAZOLIN SODIUM-DEXTROSE 2-4 GM/100ML-% IV SOLN
INTRAVENOUS | Status: AC
  Filled 2015-05-15: qty 100

## 2015-05-15 MED ORDER — SODIUM CHLORIDE 0.9 % IV SOLN: 250.0000 mL | INTRAVENOUS | Status: DC | PRN

## 2015-05-15 MED ORDER — SUCCINYLCHOLINE CHLORIDE 20 MG/ML IJ SOLN
INTRAMUSCULAR | Status: DC | PRN
  Administered 2015-05-15: 100 mg via INTRAVENOUS

## 2015-05-15 MED ORDER — OXYCODONE-ACETAMINOPHEN 5-325 MG PO TABS: 1.0000 | ORAL_TABLET | ORAL | Status: DC | PRN

## 2015-05-15 MED ORDER — LACTATED RINGERS IV SOLN
INTRAVENOUS | Status: DC | PRN
  Administered 2015-05-15 (×2): via INTRAVENOUS

## 2015-05-15 MED ORDER — HYDROMORPHONE HCL 1 MG/ML IJ SOLN
INTRAMUSCULAR | Status: DC | PRN
  Administered 2015-05-15: 1.2 mg via INTRAVENOUS
  Administered 2015-05-15: .8 mg via INTRAVENOUS

## 2015-05-15 MED ORDER — ONDANSETRON HCL 4 MG/2ML IJ SOLN
INTRAMUSCULAR | Status: DC | PRN
  Administered 2015-05-15: 4 mg via INTRAVENOUS

## 2015-05-15 MED ORDER — LIDOCAINE HCL (CARDIAC) 20 MG/ML IV SOLN
INTRAVENOUS | Status: AC
Start: 2015-05-15 — End: 2015-05-15
  Filled 2015-05-15: qty 5

## 2015-05-15 MED ORDER — ESMOLOL HCL 100 MG/10ML IV SOLN
INTRAVENOUS | Status: AC
  Filled 2015-05-15: qty 10

## 2015-05-15 MED ORDER — HYDROMORPHONE HCL 1 MG/ML IJ SOLN
INTRAMUSCULAR | Status: DC
Start: 2015-05-15 — End: 2015-05-15
  Filled 2015-05-15: qty 1

## 2015-05-15 MED ORDER — DEXAMETHASONE SODIUM PHOSPHATE 4 MG/ML IJ SOLN
INTRAMUSCULAR | Status: DC | PRN
  Administered 2015-05-15: 10 mg via INTRAVENOUS

## 2015-05-15 MED ORDER — GLYCOPYRROLATE 0.2 MG/ML IJ SOLN
INTRAMUSCULAR | Status: AC
  Filled 2015-05-15: qty 4

## 2015-05-15 MED ORDER — ONDANSETRON HCL 4 MG/2ML IJ SOLN
INTRAMUSCULAR | Status: AC
  Filled 2015-05-15: qty 2

## 2015-05-15 MED ORDER — LIDOCAINE HCL (CARDIAC) 20 MG/ML IV SOLN
INTRAVENOUS | Status: DC | PRN
  Administered 2015-05-15: 60 mg via INTRAVENOUS

## 2015-05-15 MED ORDER — ESMOLOL HCL 100 MG/10ML IV SOLN
INTRAVENOUS | Status: DC | PRN
  Administered 2015-05-15 (×2): 10 mg via INTRAVENOUS

## 2015-05-15 MED ORDER — CEFAZOLIN SODIUM 1-5 GM-% IV SOLN
1.0000 g | INTRAVENOUS | Status: DC
  Filled 2015-05-15: qty 50

## 2015-05-15 MED ORDER — ACETAMINOPHEN 650 MG RE SUPP: 650.0000 mg | RECTAL | Status: DC | PRN

## 2015-05-15 MED ORDER — MIDAZOLAM HCL 2 MG/2ML IJ SOLN
INTRAMUSCULAR | Status: AC
Start: 2015-05-15 — End: 2015-05-15
  Filled 2015-05-15: qty 2

## 2015-05-15 MED ORDER — OXYCODONE HCL 5 MG PO TABS
5.0000 mg | ORAL_TABLET | ORAL | Status: DC | PRN
  Filled 2015-05-15: qty 2

## 2015-05-15 MED ORDER — SODIUM CHLORIDE 0.9% FLUSH: 3.0000 mL | INTRAVENOUS | Status: DC | PRN

## 2015-05-15 MED ORDER — BUPIVACAINE-EPINEPHRINE 0.25% -1:200000 IJ SOLN
INTRAMUSCULAR | Status: DC | PRN
  Administered 2015-05-15: 13 mL

## 2015-05-15 MED ORDER — FENTANYL CITRATE (PF) 250 MCG/5ML IJ SOLN
INTRAMUSCULAR | Status: AC
  Filled 2015-05-15: qty 5

## 2015-05-15 MED ORDER — FENTANYL CITRATE (PF) 100 MCG/2ML IJ SOLN
INTRAMUSCULAR | Status: DC | PRN
  Administered 2015-05-15 (×2): 100 ug via INTRAVENOUS
  Administered 2015-05-15: 50 ug via INTRAVENOUS

## 2015-05-15 MED ORDER — HYDROMORPHONE HCL 2 MG/ML IJ SOLN
INTRAMUSCULAR | Status: AC
  Filled 2015-05-15: qty 1

## 2015-05-15 MED ORDER — ROCURONIUM BROMIDE 100 MG/10ML IV SOLN
INTRAVENOUS | Status: DC | PRN
  Administered 2015-05-15: 20 mg via INTRAVENOUS

## 2015-05-15 MED ORDER — NEOSTIGMINE METHYLSULFATE 10 MG/10ML IV SOLN
INTRAVENOUS | Status: DC | PRN
  Administered 2015-05-15: 5 mg via INTRAVENOUS

## 2015-05-15 MED ORDER — ROCURONIUM BROMIDE 100 MG/10ML IV SOLN
INTRAVENOUS | Status: AC
  Filled 2015-05-15: qty 1

## 2015-05-15 MED ORDER — CEFAZOLIN (ANCEF) 1 G IV SOLR
1.0000 g | INTRAVENOUS | Status: DC
  Filled 2015-05-15: qty 1

## 2015-05-15 MED ORDER — CHLORHEXIDINE GLUCONATE 4 % EX LIQD: 1.0000 "application " | Freq: Once | CUTANEOUS | Status: DC

## 2015-05-15 MED ORDER — HYDROMORPHONE HCL 1 MG/ML IJ SOLN
0.2500 mg | INTRAMUSCULAR | Status: DC | PRN
  Administered 2015-05-15 (×2): 0.5 mg via INTRAVENOUS

## 2015-05-15 MED ORDER — ACETAMINOPHEN 10 MG/ML IV SOLN
INTRAVENOUS | Status: AC
  Filled 2015-05-15: qty 100

## 2015-05-15 SURGICAL SUPPLY — 36 items
BENZOIN TINCTURE PRP APPL 2/3 (GAUZE/BANDAGES/DRESSINGS) ×3 IMPLANT
CHLORAPREP W/TINT 26ML (MISCELLANEOUS) ×3 IMPLANT
CLOSURE WOUND 1/2 X4 (GAUZE/BANDAGES/DRESSINGS) ×1
COVER SURGICAL LIGHT HANDLE (MISCELLANEOUS) ×3 IMPLANT
DECANTER SPIKE VIAL GLASS SM (MISCELLANEOUS) ×3 IMPLANT
DEVICE RELIATACK FIXATION (MISCELLANEOUS) IMPLANT
DEVICE SECURE STRAP 25 ABSORB (INSTRUMENTS) ×3 IMPLANT
DEVICE TROCAR PUNCTURE CLOSURE (ENDOMECHANICALS) ×3 IMPLANT
DRAPE LAPAROSCOPIC ABDOMINAL (DRAPES) ×3 IMPLANT
ELECT REM PT RETURN 9FT ADLT (ELECTROSURGICAL) ×3
ELECTRODE REM PT RTRN 9FT ADLT (ELECTROSURGICAL) ×1 IMPLANT
GAUZE SPONGE 2X2 8PLY STRL LF (GAUZE/BANDAGES/DRESSINGS) ×1 IMPLANT
GLOVE BIO SURGEON STRL SZ7.5 (GLOVE) ×3 IMPLANT
GOWN STRL REUS W/TWL XL LVL3 (GOWN DISPOSABLE) ×9 IMPLANT
KIT BASIN OR (CUSTOM PROCEDURE TRAY) ×3 IMPLANT
MESH VENTRALIGHT ST 4.5IN (Mesh General) ×3 IMPLANT
NEEDLE INSUFFLATION 14GA 120MM (NEEDLE) ×3 IMPLANT
PAD POSITIONING PINK XL (MISCELLANEOUS) IMPLANT
POSITIONER SURGICAL ARM (MISCELLANEOUS) IMPLANT
RELOAD RELIATACK 10 (MISCELLANEOUS) IMPLANT
RELOAD RELIATACK 5 (MISCELLANEOUS) IMPLANT
SCISSORS LAP 5X35 DISP (ENDOMECHANICALS) ×3 IMPLANT
SET IRRIG TUBING LAPAROSCOPIC (IRRIGATION / IRRIGATOR) IMPLANT
SHEARS HARMONIC ACE PLUS 36CM (ENDOMECHANICALS) IMPLANT
SLEEVE XCEL OPT CAN 5 100 (ENDOMECHANICALS) ×3 IMPLANT
SPONGE GAUZE 2X2 STER 10/PKG (GAUZE/BANDAGES/DRESSINGS) ×2
STRIP CLOSURE SKIN 1/2X4 (GAUZE/BANDAGES/DRESSINGS) ×2 IMPLANT
SUT CHROMIC 2 0 SH (SUTURE) ×3 IMPLANT
SUT MNCRL AB 4-0 PS2 18 (SUTURE) ×3 IMPLANT
TAPE CLOTH 4X10 WHT NS (GAUZE/BANDAGES/DRESSINGS) IMPLANT
TOWEL OR 17X26 10 PK STRL BLUE (TOWEL DISPOSABLE) ×3 IMPLANT
TRAY FOLEY W/METER SILVER 14FR (SET/KITS/TRAYS/PACK) IMPLANT
TRAY FOLEY W/METER SILVER 16FR (SET/KITS/TRAYS/PACK) IMPLANT
TRAY LAPAROSCOPIC (CUSTOM PROCEDURE TRAY) ×3 IMPLANT
TROCAR BLADELESS OPT 5 100 (ENDOMECHANICALS) ×3 IMPLANT
TROCAR XCEL NON-BLD 11X100MML (ENDOMECHANICALS) IMPLANT

## 2015-05-15 NOTE — Anesthesia Preprocedure Evaluation (Signed)
Anesthesia Evaluation  Patient identified by MRN, date of birth, ID band Patient awake    Reviewed: Allergy & Precautions, NPO status , Patient's Chart, lab work & pertinent test results  Airway Mallampati: II  TM Distance: <3 FB Neck ROM: Full    Dental no notable dental hx.    Pulmonary asthma ,    Pulmonary exam normal breath sounds clear to auscultation       Cardiovascular negative cardio ROS Normal cardiovascular exam Rhythm:Regular Rate:Normal     Neuro/Psych negative neurological ROS  negative psych ROS   GI/Hepatic Neg liver ROS, GERD  ,  Endo/Other  Morbid obesity  Renal/GU negative Renal ROS  negative genitourinary   Musculoskeletal negative musculoskeletal ROS (+)   Abdominal   Peds negative pediatric ROS (+)  Hematology negative hematology ROS (+)   Anesthesia Other Findings   Reproductive/Obstetrics negative OB ROS                             Anesthesia Physical Anesthesia Plan  ASA: II  Anesthesia Plan: General   Post-op Pain Management:    Induction: Intravenous  Airway Management Planned: Oral ETT  Additional Equipment:   Intra-op Plan:   Post-operative Plan: Extubation in OR  Informed Consent: I have reviewed the patients History and Physical, chart, labs and discussed the procedure including the risks, benefits and alternatives for the proposed anesthesia with the patient or authorized representative who has indicated his/her understanding and acceptance.   Dental advisory given  Plan Discussed with: CRNA and Surgeon  Anesthesia Plan Comments:         Anesthesia Quick Evaluation

## 2015-05-15 NOTE — Anesthesia Procedure Notes (Signed)
Procedure Name: Intubation Date/Time: 05/15/2015 7:28 AM Performed by: Raenette Rover Pre-anesthesia Checklist: Patient identified, Emergency Drugs available, Suction available and Patient being monitored Patient Re-evaluated:Patient Re-evaluated prior to inductionOxygen Delivery Method: Circle system utilized Preoxygenation: Pre-oxygenation with 100% oxygen Intubation Type: IV induction Ventilation: Mask ventilation without difficulty Laryngoscope Size: Mac and 3 Grade View: Grade I Tube type: Oral Tube size: 7.0 mm Number of attempts: 1 Airway Equipment and Method: Stylet Placement Confirmation: breath sounds checked- equal and bilateral,  ETT inserted through vocal cords under direct vision,  positive ETCO2 and CO2 detector Secured at: 21 cm Tube secured with: Tape Dental Injury: Teeth and Oropharynx as per pre-operative assessment

## 2015-05-15 NOTE — Op Note (Signed)
05/15/2015  8:15 AM  PATIENT:  Amanda [name]  35 y.o. female  PRE-OPERATIVE DIAGNOSIS:  Umbilical hernia  POST-OPERATIVE DIAGNOSIS:  Incarcerated Umbilical hernia  PROCEDURE:  Procedure(s): LAPAROSCOPIC UMBILICAL HERNIA (N/A) INSERTION OF MESH (N/A)  SURGEON:  Surgeon(s) and Role:    * Ralene Ok, MD - Primary  ANESTHESIA:   local and general  EBL:  Total I/O In: -  Out: 5 [Blood:5]  BLOOD ADMINISTERED:none  DRAINS: none   LOCAL MEDICATIONS USED:  BUPIVICAINE   SPECIMEN:  No Specimen  DISPOSITION OF SPECIMEN:  N/A  COUNTS:  YES  TOURNIQUET:  * No tourniquets in log *  DICTATION: .Dragon Dictation  Details of the procedure:   After the patient was consented patient was taken back to the operating room patient was then placed in supine position bilateral SCDs in place.  The patient was prepped and draped in the usual sterile fashion. After antibiotics were confirmed a timeout was called and all facts were verified. The Veress needle technique was used to insuflate the abdomen at Palmer's point. The abdomen was insufflated to 14 mm mercury. Subsequently a 5 mm trocar was placed a camera inserted there was no injury to any intra-abdominal organs.    There was seen to be an incarcerated  1cm umbilical hernia.  A second camera port was in placed into the left lower quadrant.   At this the Falicform ligament was taken down with Bovie cautery maintaining hemostasis. A 21mm port was placed in the epigastrium .   I proceeded to reduce the omental incarceration.  Once the hernia was cleared away, a Bard Ventralight 11.4cm  mesh was inserted into the abdomen.  The mesh was secured circumferentially with am Securestrap tacker in a double crown fashion.    The omentum was brought over the area of the mesh. The pneumoperitoneum was evacuated  & all trocars  were removed. The skin was reapproximated with 4-0  Monocryl sutures in a subcuticular fashion. The skin was dressed  with Steri-Strips tape and gauze.  The patient was taken to the recovery room in stable condition.   PLAN OF CARE: Discharge to home after PACU  PATIENT DISPOSITION:  PACU - hemodynamically stable.   Delay start of Pharmacological VTE agent (>24hrs) due to surgical blood loss or risk of bleeding: not applicable

## 2015-05-15 NOTE — Transfer of Care (Signed)
Immediate Anesthesia Transfer of Care Note  Patient: Amanda [name]  Procedure(s) Performed: Procedure(s): LAPAROSCOPIC UMBILICAL HERNIA (N/A) INSERTION OF MESH (N/A)  Patient Location: PACU  Anesthesia Type:General  Level of Consciousness: awake, alert , oriented and patient cooperative  Airway & Oxygen Therapy: Patient Spontanous Breathing and Patient connected to face mask oxygen  Post-op Assessment: Report given to RN and Post -op Vital signs reviewed and stable  Post vital signs: Reviewed and stable  Last Vitals:  Filed Vitals:   05/15/15 0627  BP: 127/90  Pulse: 72  Temp: 36.7 C  Resp: 18    Complications: No apparent anesthesia complications

## 2015-05-15 NOTE — Discharge Instructions (Signed)
[facility] _______[facility] ° °UMBILICAL HERNIA REPAIR: POST OP INSTRUCTIONS ° °Always review your discharge instruction sheet given to you by the facility where your surgery was performed. °IF YOU HAVE DISABILITY OR FAMILY LEAVE FORMS, YOU MUST BRING THEM TO THE OFFICE FOR PROCESSING.   °DO NOT GIVE THEM TO YOUR DOCTOR. ° °1. A  prescription for pain medication may be given to you upon discharge.  Take your pain medication as prescribed, if needed.  If narcotic pain medicine is not needed, then you may take acetaminophen (Tylenol) or ibuprofen (Advil) as needed. °2. Take your usually prescribed medications unless otherwise directed. °3. If you need a refill on your pain medication, please contact your pharmacy.  They will contact our office to request authorization. Prescriptions will not be filled after 5 pm or on week-ends. °4. You should follow a light diet the first 24 hours after arrival home, such as soup and crackers, etc.  Be sure to include lots of fluids daily.  Resume your normal diet the day after surgery. °5. Most patients will experience some swelling and bruising around the umbilicus or in the groin and scrotum.  Ice packs and reclining will help.  Swelling and bruising can take several days to resolve.  °6. It is common to experience some constipation if taking pain medication after surgery.  Increasing fluid intake and taking a stool softener (such as Colace) will usually help or prevent this problem from occurring.  A mild laxative (Milk of Magnesia or Miralax) should be taken according to package directions if there are no bowel movements after 48 hours. °7. Unless discharge instructions indicate otherwise, you may remove your bandages 24-48 hours after surgery, and you may shower at that time.  You may have steri-strips (small skin tapes) in place directly over the incision.  These strips should be left on the skin for 7-10 days.  If your surgeon used skin glue on the incision, you  may shower in 24 hours.  The glue will flake off over the next 2-3 weeks.  Any sutures or staples will be removed at the office during your follow-up visit. °8. ACTIVITIES:  You may resume regular (light) daily activities beginning the next day--such as daily self-care, walking, climbing stairs--gradually increasing activities as tolerated.  You may have sexual intercourse when it is comfortable.  Refrain from any heavy lifting or straining until approved by your doctor. °a. You may drive when you are no longer taking prescription pain medication, you can comfortably wear a seatbelt, and you can safely maneuver your car and apply brakes. °b. RETURN TO WORK:  __________________________________________________________ °9. You should see your doctor in the office for a follow-up appointment approximately 2-3 weeks after your surgery.  Make sure that you call for this appointment within a day or two after you arrive home to insure a convenient appointment time. °10. OTHER INSTRUCTIONS:  __________________________________________________________________________________________________________________________________________________________________________________________  °WHEN TO CALL YOUR DOCTOR: °1. Fever over 101.0 °2. Inability to urinate °3. Nausea and/or vomiting °4. Extreme swelling or bruising °5. Continued bleeding from incision. °6. Increased pain, redness, or drainage from the incision ° °The clinic staff is available to answer your questions during regular business hours.  Please don’t hesitate to call and ask to speak to one of the nurses for clinical concerns.  If you have a medical emergency, go to the nearest emergency room or call 911.  A surgeon from [facility] is always on call at the hospital ° ° °[address], [address], [place], [place]  [address] ? ° [address] [address], [place], [place]   [address] °[phone] ? [phone] ? FAX [phone] °Web site:  [email or web address] ° °

## 2015-05-15 NOTE — H&P (Signed)
History of Present Illness Ralene Ok MD; 03/10/2015 3:57 PM) The patient is a 35 year old female who presents with an umbilical hernia. Patient is a 35 year old female who is referred by [name], NP for evaluation of an umbilical hernia. Patient states that she is at this hernia there for multiple years. She states that she's had more and more pain this time is gone. Patient has undergone a CT scan which reveals a umbilical hernia.     Other Problems [name], CMA; 03/10/2015 3:27 PM) Anxiety Disorder Asthma Back Pain Gastroesophageal Reflux Disease Heart murmur Migraine Headache  Past Surgical History [name], CMA; 03/10/2015 3:27 PM) Cesarean Section - 1 Colon Polyp Removal - Colonoscopy  Diagnostic Studies History [name], CMA; 03/10/2015 3:27 PM) Colonoscopy 5-10 years ago Mammogram within last year Pap Smear 1-5 years ago  Allergies [name], CMA; 03/10/2015 3:28 PM) Doxycycline Hyclate *Tetracyclines** Ketorolac *ANALGESICS - ANTI-INFLAMMATORY* Amoxicillin ER *PENICILLINS*  Medication History [name], CMA; 03/10/2015 3:28 PM) [name] (180MG  Tablet, Oral) Active. [name] (100MG  Tablet, Oral) Active. Cyclobenzaprine HCl (10MG  Tablet, Oral) Active. Medications Reconciled  Social History [name], [place]; 03/10/2015 3:27 PM) No alcohol use No caffeine use No drug use Tobacco use Never smoker.  Family History [name], [place]; 03/10/2015 3:27 PM) Alcohol Abuse Father. Depression Father. Heart disease in female family member before age 46 Hypertension Mother.  Pregnancy / Birth History [name], [place]; 03/10/2015 3:27 PM) Age at menarche 76 years. Gravida 2 Maternal age 88-25 Para 1 Regular periods    Review of Systems [name] CMA; 03/10/2015 3:27 PM) General Not Present- Appetite Loss, Chills, Fatigue, Fever, Night Sweats, Weight Gain and Weight Loss. Skin Not Present- Change in Wart/Mole,  Dryness, Hives, Jaundice, New Lesions, Non-Healing Wounds, Rash and Ulcer. HEENT Not Present- Earache, Hearing Loss, Hoarseness, Nose Bleed, Oral Ulcers, Ringing in the Ears, Seasonal Allergies, Sinus Pain, Sore Throat, Visual Disturbances, Wears glasses/contact lenses and Yellow Eyes. Respiratory Not Present- Bloody sputum, Chronic Cough, Difficulty Breathing, Snoring and Wheezing. Breast Not Present- Breast Mass, Breast Pain, Nipple Discharge and Skin Changes. Cardiovascular Not Present- Chest Pain, Difficulty Breathing Lying Down, Leg Cramps, Palpitations, Rapid Heart Rate, Shortness of Breath and Swelling of Extremities. Gastrointestinal Present- Abdominal Pain and Nausea. Not Present- Bloating, Bloody Stool, Change in Bowel Habits, Chronic diarrhea, Constipation, Difficulty Swallowing, Excessive gas, Gets full quickly at meals, Hemorrhoids, Indigestion, Rectal Pain and Vomiting. Female Genitourinary Not Present- Frequency, Nocturia, Painful Urination, Pelvic Pain and Urgency. Neurological Present- Headaches. Not Present- Decreased Memory, Fainting, Numbness, Seizures, Tingling, Tremor, Trouble walking and Weakness. Psychiatric Not Present- Anxiety, Bipolar, Change in Sleep Pattern, Depression, Fearful and Frequent crying. Endocrine Not Present- Cold Intolerance, Excessive Hunger, Hair Changes, Heat Intolerance, Hot flashes and New Diabetes. Hematology Not Present- Easy Bruising, Excessive bleeding, Gland problems, HIV and Persistent Infections.   BP 127/90 mmHg  Pulse 72  Temp(Src) 98 F (36.7 C) (Oral)  Resp 18  Ht 5\' 3"  (1.6 m)  Wt 122.925 kg (271 lb)  BMI 48.02 kg/m2   Physical Exam Ralene Ok MD; 03/10/2015 3:54 PM) General Mental Status-Alert. General Appearance-Consistent with stated age. Hydration-Well hydrated. Voice-Normal.  Head and Neck Head-normocephalic, atraumatic with no lesions or palpable masses. Trachea-midline. Thyroid Gland Characteristics -  normal size and consistency.  Chest and Lung Exam Chest and lung exam reveals -quiet, even and easy respiratory effort with no use of accessory muscles and on auscultation, normal breath sounds, no adventitious sounds and normal vocal resonance. Inspection Chest Wall -  Normal. Back - normal.  Cardiovascular Cardiovascular examination reveals -normal heart sounds, regular rate and rhythm with no murmurs and normal pedal pulses bilaterally.  Abdomen Inspection Skin - Scar - no surgical scars. Hernias - Umbilical hernia - Incarcerated(2 cm). Palpation/Percussion Normal exam - Soft, Non Tender, No Rebound tenderness, No Rigidity (guarding) and No hepatosplenomegaly. Auscultation Normal exam - Bowel sounds normal.    Assessment & Plan Ralene Ok MD; [id number] PM) UMBILICAL HERNIA WITHOUT OBSTRUCTION AND WITHOUT GANGRENE (K42.9) Impression: 35 year old female with umbilical hernia.  1. The patient will like to proceed to the operating room for laparoscopic umbilical hernia repair with mesh.  2.  I discussed with the patient the risks and benefits of the procedure to include but not limited to: Infection, bleeding, damage to surrounding structures, possible need for further surgery, possible nerve pain, and possible recurrence. The patient was understanding and wishes to proceed.

## 2015-05-15 NOTE — Anesthesia Postprocedure Evaluation (Signed)
Anesthesia Post Note  Patient: Amanda [name]  Procedure(s) Performed: Procedure(s) (LRB): LAPAROSCOPIC UMBILICAL HERNIA (N/A) INSERTION OF MESH (N/A)  Patient location during evaluation: PACU Anesthesia Type: General Level of consciousness: awake and alert Pain management: pain level controlled Vital Signs Assessment: post-procedure vital signs reviewed and stable Respiratory status: spontaneous breathing, nonlabored ventilation, respiratory function stable and patient connected to nasal cannula oxygen Cardiovascular status: blood pressure returned to baseline and stable Postop Assessment: no signs of nausea or vomiting Anesthetic complications: no    Last Vitals:  Filed Vitals:   05/15/15 0841 05/15/15 0845  BP:  138/90  Pulse: 63 72  Temp:    Resp: 29 16    Last Pain:  Filed Vitals:   05/15/15 0856  PainSc: 10-Worst pain ever                 Leighanna Kirn S

## 2015-05-16 ENCOUNTER — Other Ambulatory Visit: Payer: Self-pay | Admitting: Neurology

## 2015-06-06 ENCOUNTER — Emergency Department (HOSPITAL_COMMUNITY): Payer: Medicaid Other

## 2015-06-06 ENCOUNTER — Encounter (HOSPITAL_COMMUNITY): Payer: Self-pay | Admitting: Emergency Medicine

## 2015-06-06 ENCOUNTER — Emergency Department (HOSPITAL_COMMUNITY)
Admission: EM | Admit: 2015-06-06 | Discharge: 2015-06-06 | Disposition: A | Discharge status: Home / Self Care | Payer: Medicaid Other | Attending: Emergency Medicine | Admitting: Emergency Medicine

## 2015-06-06 DIAGNOSIS — Z3202 Encounter for pregnancy test, result negative: Secondary | ICD-10-CM | POA: Diagnosis not present

## 2015-06-06 DIAGNOSIS — J45909 Unspecified asthma, uncomplicated: Secondary | ICD-10-CM | POA: Insufficient documentation

## 2015-06-06 DIAGNOSIS — R109 Unspecified abdominal pain: Secondary | ICD-10-CM | POA: Insufficient documentation

## 2015-06-06 DIAGNOSIS — Z8659 Personal history of other mental and behavioral disorders: Secondary | ICD-10-CM | POA: Insufficient documentation

## 2015-06-06 DIAGNOSIS — R011 Cardiac murmur, unspecified: Secondary | ICD-10-CM | POA: Diagnosis not present

## 2015-06-06 DIAGNOSIS — R05 Cough: Secondary | ICD-10-CM | POA: Insufficient documentation

## 2015-06-06 DIAGNOSIS — R0981 Nasal congestion: Secondary | ICD-10-CM | POA: Insufficient documentation

## 2015-06-06 DIAGNOSIS — I209 Angina pectoris, unspecified: Secondary | ICD-10-CM | POA: Diagnosis not present

## 2015-06-06 DIAGNOSIS — Z880 Allergy status to penicillin: Secondary | ICD-10-CM | POA: Insufficient documentation

## 2015-06-06 DIAGNOSIS — R059 Cough, unspecified: Secondary | ICD-10-CM

## 2015-06-06 DIAGNOSIS — Z79899 Other long term (current) drug therapy: Secondary | ICD-10-CM | POA: Insufficient documentation

## 2015-06-06 DIAGNOSIS — Z8719 Personal history of other diseases of the digestive system: Secondary | ICD-10-CM | POA: Insufficient documentation

## 2015-06-06 DIAGNOSIS — Z8614 Personal history of Methicillin resistant Staphylococcus aureus infection: Secondary | ICD-10-CM | POA: Insufficient documentation

## 2015-06-06 LAB — I-STAT BETA HCG BLOOD, ED (MC, WL, AP ONLY): I-stat hCG, quantitative: <5 m[IU]/mL (ref <5)

## 2015-06-06 MED ORDER — NAPROXEN 250 MG PO TABS
500.0000 mg | ORAL_TABLET | Freq: Once | ORAL | Status: AC
  Administered 2015-06-06: 500 mg via ORAL
  Filled 2015-06-06: qty 2

## 2015-06-06 MED ORDER — LEVOFLOXACIN 500 MG PO TABS: 500.0000 mg | ORAL_TABLET | Freq: Every day | ORAL | Status: DC

## 2015-06-06 MED ORDER — ALBUTEROL SULFATE HFA 108 (90 BASE) MCG/ACT IN AERS
2.0000 | INHALATION_SPRAY | Freq: Once | RESPIRATORY_TRACT | Status: AC
  Administered 2015-06-06: 2 via RESPIRATORY_TRACT
  Filled 2015-06-06: qty 6.7

## 2015-06-06 MED ORDER — OXYCODONE-ACETAMINOPHEN 5-325 MG PO TABS: 1.0000 | ORAL_TABLET | Freq: Four times a day (QID) | ORAL | Status: DC | PRN

## 2015-06-06 MED ORDER — ALBUTEROL SULFATE HFA 108 (90 BASE) MCG/ACT IN AERS: 2.0000 | INHALATION_SPRAY | RESPIRATORY_TRACT | Status: DC | PRN

## 2015-06-06 MED ORDER — OXYCODONE-ACETAMINOPHEN 5-325 MG PO TABS
1.0000 | ORAL_TABLET | Freq: Once | ORAL | Status: AC
  Administered 2015-06-06: 1 via ORAL
  Filled 2015-06-06: qty 1

## 2015-06-06 MED ORDER — PREDNISONE 20 MG PO TABS: 40.0000 mg | ORAL_TABLET | Freq: Every day | ORAL | Status: DC

## 2015-06-06 MED ORDER — LEVOFLOXACIN 500 MG PO TABS
500.0000 mg | ORAL_TABLET | Freq: Once | ORAL | Status: AC
  Administered 2015-06-06: 500 mg via ORAL
  Filled 2015-06-06 (×2): qty 1

## 2015-06-06 NOTE — ED Notes (Signed)
Called xray to inform preg test negative. En route to get pt

## 2015-06-06 NOTE — Progress Notes (Signed)
Orthopedic Tech Progress Note Patient Details:  Amanda [name] 1980-08-21 [id number]  Ortho Devices Type of Ortho Device: Abdominal binder Ortho Device/Splint Interventions: Ordered, Application   Karolee Stamps 06/06/2015, 6:10 AM

## 2015-06-06 NOTE — ED Provider Notes (Signed)
8:05 AM Assumed care at change of shift with imaging pending. "Mild opacification over the right base which may be due to atelectasis versus early infection." May be atelectasis, particularly with recent surgery. Will cover for possible infectious etiology though. Dr Dina Rich previously ordered incentive spirometer. Will add abx to other meds. Quinolone to cover for possible HCAP.  I feel she is appropriate for outpt treatment at this time.      Virgel Manifold, MD 06/06/15 [phone]

## 2015-06-06 NOTE — Discharge Instructions (Signed)
He was seen today for cough and pain. Pain is likely related to your recent surgery. Workup is reassuring. Given your history of asthma he will be given prednisone and an inhaler. You'll be given a short course of pain medication.  Continue the abdominal binder for support.  Cough, Adult Coughing is a reflex that clears your throat and your airways. Coughing helps to heal and protect your lungs. It is normal to cough occasionally, but a cough that happens with other symptoms or lasts a long time may be a sign of a condition that needs treatment. A cough may last only 2-3 weeks (acute), or it may last longer than 8 weeks (chronic). CAUSES Coughing is commonly caused by:  Breathing in substances that irritate your lungs.  A viral or bacterial respiratory infection.  Allergies.  Asthma.  Postnasal drip.  Smoking.  Acid backing up from the stomach into the esophagus (gastroesophageal reflux).  Certain medicines.  Chronic lung problems, including COPD (or rarely, lung cancer).  Other medical conditions such as heart failure. HOME CARE INSTRUCTIONS  Pay attention to any changes in your symptoms. Take these actions to help with your discomfort:  Take medicines only as told by your health care provider.  If you were prescribed an antibiotic medicine, take it as told by your health care provider. Do not stop taking the antibiotic even if you start to feel better.  Talk with your health care provider before you take a cough suppressant medicine.  Drink enough fluid to keep your urine clear or pale yellow.  If the air is dry, use a cold steam vaporizer or humidifier in your bedroom or your home to help loosen secretions.  Avoid anything that causes you to cough at work or at home.  If your cough is worse at night, try sleeping in a semi-upright position.  Avoid cigarette smoke. If you smoke, quit smoking. If you need help quitting, ask your health care provider.  Avoid  caffeine.  Avoid alcohol.  Rest as needed. SEEK MEDICAL CARE IF:   You have new symptoms.  You cough up pus.  Your cough does not get better after 2-3 weeks, or your cough gets worse.  You cannot control your cough with suppressant medicines and you are losing sleep.  You develop pain that is getting worse or pain that is not controlled with pain medicines.  You have a fever.  You have unexplained weight loss.  You have night sweats. SEEK IMMEDIATE MEDICAL CARE IF:  You cough up blood.  You have difficulty breathing.  Your heartbeat is very fast.   This information is not intended to replace advice given to you by your health care provider. Make sure you discuss any questions you have with your health care provider.   Document Released: 07/09/2010 Document Revised: 10/01/2014 Document Reviewed: 03/19/2014 Elsevier Interactive Patient Education [employer].

## 2015-06-06 NOTE — ED Provider Notes (Signed)
CSN: [id number]     Arrival date & time 06/06/15  0508 History   First MD Initiated Contact with Patient 06/06/15 [phone]     Chief Complaint  Patient presents with  . Abdominal Pain  . Nasal Congestion  . Cough     (Consider location/radiation/quality/duration/timing/severity/associated sxs/prior Treatment) HPI  This is a 35 year old female with a history of asthma, morbid obesity, recent incarcerated umbilical hernia status post laparoscopic mesh repair on April 21 presents with cough and congestion. Patient reports 5 day history of cough productive of green sputum. Denies any fever. Reports chest and abdominal pain with coughing. She states that the pain is over "where I had surgery." She only has pain when she coughs. She has taken Delsym and Alka-Seltzer with minimal relief. She has not taken anything for pain as she ran out of her Percocet. Denies any leg swelling.  Past Medical History  Diagnosis Date  . Asthma   . GERD (gastroesophageal reflux disease)   . Headache(784.0)   . Anxiety   . Environmental allergies   . Acid reflux   . H/O hiatal hernia   . Depression   . Heart murmur   . Allergy   . Genital warts   . Constipation   . MRSA cellulitis   . Anginal pain ([place])   . Dysrhythmia     Palpatations   Past Surgical History  Procedure Laterality Date  . Cervical cone biopsy    . Marsupialization urethral diverticulum    . Cesarean section  01/20/2012    Procedure: CESAREAN SECTION;  Surgeon: Melina Schools, MD;  Location: [facility];  Service: Obstetrics;  Laterality: N/A;  . Upper gi endoscopy    . Colonoscopy    . Umbilical hernia repair N/A 05/15/2015    Procedure: LAPAROSCOPIC UMBILICAL HERNIA;  Surgeon: Ralene Ok, MD;  Location: [facility];  Service: General;  Laterality: N/A;  . Insertion of mesh N/A 05/15/2015    Procedure: INSERTION OF MESH;  Surgeon: Ralene Ok, MD;  Location: [facility];  Service: General;  Laterality: N/A;   Family History  Problem  Relation Age of Onset  . Diabetes Mother   . Hypertension Mother   . Depression Mother   . Depression Father   . Anesthesia problems Neg Hx   . Breast cancer Maternal Aunt     [employer]  . Heart disease Maternal Grandmother   . Diabetes Maternal Grandfather   . Heart disease Maternal Grandfather   . Hypertension Maternal Grandfather    Social History  Substance Use Topics  . Smoking status: Never Smoker   . Smokeless tobacco: Never Used  . Alcohol Use: No   OB History    Gravida Para Term Preterm AB TAB SAB Ectopic Multiple Living   2 1 1  0 1 0 1 0 0 1     Review of Systems  Constitutional: Negative for fever.  Respiratory: Positive for cough. Negative for shortness of breath.   Cardiovascular: Positive for chest pain. Negative for leg swelling.  Gastrointestinal: Positive for abdominal pain.      Allergies  Doxycycline hyclate; Ketorolac tromethamine; Amoxicillin; and Doxycycline  Home Medications   Prior to Admission medications   Medication Sig Start Date End Date Taking? Authorizing Provider  cyclobenzaprine (FLEXERIL) 10 MG tablet May use every 8 hours for headache Patient taking differently: Take 10 mg by mouth every 8 (eight) hours as needed (Headache). May use every 8 hours for headache 08/14/14  Yes Lance Bosch, NP  albuterol (PROVENTIL HFA;VENTOLIN HFA) 108 (90 Base) MCG/ACT inhaler Inhale 2 puffs into the lungs every 4 (four) hours as needed for wheezing or shortness of breath. 06/06/15   Merryl Hacker, MD  oxyCODONE-acetaminophen (PERCOCET/ROXICET) 5-325 MG tablet Take 1 tablet by mouth every 6 (six) hours as needed for severe pain. 06/06/15   Merryl Hacker, MD  predniSONE (DELTASONE) 20 MG tablet Take 2 tablets (40 mg total) by mouth daily with breakfast. 06/06/15   Merryl Hacker, MD   BP 118/57 mmHg  Pulse 73  Temp(Src) 98.3 F (36.8 C) (Oral)  Resp 16  Ht 5\' 3"  (1.6 m)  Wt 266 lb (120.657 kg)  BMI 47.13 kg/m2  SpO2 100%  LMP 05/10/2015  (Exact Date) Physical Exam  Constitutional: She is oriented to person, place, and time. She appears well-developed and well-nourished.  Morbidly obese  HENT:  Head: Normocephalic and atraumatic.  Cardiovascular: Normal rate, regular rhythm and normal heart sounds.   No murmur heard. Pulmonary/Chest: Effort normal and breath sounds normal. No respiratory distress. She has no wheezes.  Abdominal: Soft. Bowel sounds are normal. There is tenderness. There is no rebound and no guarding.  Multiple well-healing laparoscopic scars, minimal tenderness to palpation  Neurological: She is alert and oriented to person, place, and time.  Skin: Skin is warm and dry.  Psychiatric: She has a normal mood and affect.  Nursing note and vitals reviewed.   ED Course  Procedures (including critical care time) Labs Review Labs Reviewed  I-STAT BETA HCG BLOOD, ED ([facility], [facility], [facility] ONLY)    Imaging Review No results found. I have personally reviewed and evaluated these images and lab results as part of my medical decision-making.   EKG Interpretation None      MDM   Final diagnoses:  Cough    Patient presents with cough, congestion, chest, and abdominal pain. Symptoms are related to cough. She is nontoxic on exam. Vital signs are reassuring. Afebrile. Minimal tenderness on exam. Considerations for cough include viral URI, pneumonia, atelectasis given recent surgery. Low suspicion at this time for PE. Regarding her abdominal pain, this appears secondary to cough. Her abdominal exam is fairly benign. Patient was given pain medication. Abdominal binder ordered. She was also given an inhaler given her history of asthma.  Chest x-ray ordered to evaluate for pneumonia.  Signed out to Dr. Wilson Singer pending Odette Horns.  Patient also given incentive spirometry.    Merryl Hacker, MD 06/06/15 2300

## 2015-06-06 NOTE — ED Notes (Signed)
Pt in reporting cough and congestion present for past week. Pt had hernia repair approx 2 weeks ago and has been having surgical site pain from all the coughing.

## 2015-06-06 NOTE — ED Notes (Signed)
Paged ortho tech for abd binder. En route

## 2015-07-08 ENCOUNTER — Ambulatory Visit: Payer: Medicaid Other | Admitting: Neurology

## 2015-07-10 ENCOUNTER — Ambulatory Visit: Payer: Medicaid Other | Admitting: Neurology

## 2015-08-19 ENCOUNTER — Telehealth: Payer: Self-pay

## 2015-08-19 ENCOUNTER — Ambulatory Visit (INDEPENDENT_AMBULATORY_CARE_PROVIDER_SITE_OTHER): Payer: Medicaid Other | Admitting: Neurology

## 2015-08-19 ENCOUNTER — Encounter: Payer: Self-pay | Admitting: Neurology

## 2015-08-19 VITALS — BP 110/66 | HR 86

## 2015-08-19 DIAGNOSIS — G43719 Chronic migraine without aura, intractable, without status migrainosus: Secondary | ICD-10-CM

## 2015-08-19 MED ORDER — ELETRIPTAN HYDROBROMIDE 40 MG PO TABS: ORAL_TABLET | ORAL | 3 refills | Status: DC

## 2015-08-19 NOTE — Progress Notes (Signed)
NEUROLOGY FOLLOW UP OFFICE NOTE  Amanda [name] [id number]  HISTORY OF PRESENT ILLNESS: Amanda [name] is a 35 year old right-handed woman with asthma, GERD, depression and morbid obesity who follows up for chronic migraine.  UPDATE: No change but she isn't on a preventative as atenolol caused side effects.  Maxalt was ineffective. Intensity:  6-10/10 Duration: constant Frequency:  daily Current medication:  Flexeril as needed.  Also ice packs Current preventative medication:  none  HISTORY: Onset:  adolescence Location:  Holocephalic from neck and shoulders to behind the eyes Quality:  Throbbing, pressure Initial intensity:  10/10; February 6-10/10 Aura:  no Prodrome:  no Associated symptoms:  Dizziness, nausea, photophobia, phonophobia Initial Duration:  constant; February all day Initial Frequency:  Daily (16-20 days severe); February 20 headache days per month Triggers/exacerbating factors:  Stress, getting upset Relieving factors:  none Activity:  Difficult to function when severe  Past abortive therapy:  BC, Advil, Tylenol, Excedrin, Aleve, Sumatriptan 100mg  Past preventative therapy:  Trigger point injections, Zoloft (for depression), topamax 100mg  (ineffective), nortriptyline 25mg  (side effects)  TSH last month was 0.598.  Caffeine:  no Alcohol:  no Smoker:  no Diet:  Cut out pork, cheese, chocolate.  Increased water intake Exercise:  walking Depression/stress:  stress Sleep hygiene:  poor Family history of headache:  no  PAST MEDICAL HISTORY: Past Medical History:  Diagnosis Date  . Acid reflux   . Allergy   . Anginal pain ([place])   . Anxiety   . Asthma   . Constipation   . Depression   . Dysrhythmia    Palpatations  . Environmental allergies   . Genital warts   . GERD (gastroesophageal reflux disease)   . H/O hiatal hernia   . Headache(784.0)   . Heart murmur   . MRSA cellulitis     MEDICATIONS: Current Outpatient  Prescriptions on File Prior to Visit  Medication Sig Dispense Refill  . albuterol (PROVENTIL HFA;VENTOLIN HFA) 108 (90 Base) MCG/ACT inhaler Inhale 2 puffs into the lungs every 4 (four) hours as needed for wheezing or shortness of breath. 1 Inhaler 0  . cyclobenzaprine (FLEXERIL) 10 MG tablet May use every 8 hours for headache (Patient taking differently: Take 10 mg by mouth every 8 (eight) hours as needed (Headache). May use every 8 hours for headache) 60 tablet 4   No current facility-administered medications on file prior to visit.     ALLERGIES: Allergies  Allergen Reactions  . Doxycycline Hyclate Hives, Itching and Swelling    REACTION: swelling of face  . Ketorolac Tromethamine Other (See Comments)    Patient stated that it makes her extremely hyper  . Amoxicillin Swelling and Rash    Has patient had a PCN reaction causing immediate rash, facial/tongue/throat swelling, SOB or lightheadedness with hypotension: Unsure Has patient had a PCN reaction causing severe rash involving mucus membranes or skin necrosis: Unsure Has patient had a PCN reaction that required hospitalization No Has patient had a PCN reaction occurring within the last 10 years: No If all of the above answers are "NO", then may proceed with Cephalosporin use.REACTION: facial swelling and rash.  . Doxycycline Rash    FAMILY HISTORY: Family History  Problem Relation Age of Onset  . Diabetes Mother   . Hypertension Mother   . Depression Mother   . Depression Father   . Anesthesia problems Neg Hx   . Breast cancer Maternal Aunt     [employer]  . Heart disease Maternal Grandmother   .  Diabetes Maternal Grandfather   . Heart disease Maternal Grandfather   . Hypertension Maternal Grandfather     SOCIAL HISTORY: Social History   Social History  . Marital status: Single    Spouse name: N/A  . Number of children: 1  . Years of education: 12+   Occupational History  . Not on file.   Social History Main  Topics  . Smoking status: Never Smoker  . Smokeless tobacco: Never Used  . Alcohol use No  . Drug use: No  . Sexual activity: Yes    Partners: Male    Birth control/ protection: None     Comment: last intercourse last night   Other Topics Concern  . Not on file   Social History Narrative   Regular exercise-no   Caffeine Use-yes    REVIEW OF SYSTEMS: Constitutional: No fevers, chills, or sweats, no generalized fatigue, change in appetite Eyes: No visual changes, double vision, eye pain Ear, nose and throat: No hearing loss, ear pain, nasal congestion, sore throat Cardiovascular: No chest pain, palpitations Respiratory:  No shortness of breath at rest or with exertion, wheezes GastrointestinaI: No nausea, vomiting, diarrhea, abdominal pain, fecal incontinence Genitourinary:  No dysuria, urinary retention or frequency Musculoskeletal:  No neck pain, back pain Integumentary: No rash, pruritus, skin lesions Neurological: as above Psychiatric: No depression, insomnia, anxiety Endocrine: No palpitations, fatigue, diaphoresis, mood swings, change in appetite, change in weight, increased thirst Hematologic/Lymphatic:  No purpura, petechiae. Allergic/Immunologic: no itchy/runny eyes, nasal congestion, recent allergic reactions, rashes  PHYSICAL EXAM: Vitals:   08/19/15 1351  BP: 110/66  Pulse: 86   General: No acute distress.  Patient appears well-groomed.   Head:  Normocephalic/atraumatic Eyes:  Fundi examined but not visualized Neck: supple, no paraspinal tenderness, full range of motion Heart:  Regular rate and rhythm Lungs:  Clear to auscultation bilaterally Back: No paraspinal tenderness Neurological Exam: alert and oriented to person, place, and time. Attention span and concentration intact, recent and remote memory intact, fund of knowledge intact.  Speech fluent and not dysarthric, language intact.  CN II-XII intact. Bulk and tone normal, muscle strength 5/5 throughout.   Sensation to light touch intact.  Deep tendon reflexes 2+ throughout, toes downgoing.  Finger to nose testing intact.  Gait normal, Romberg negative.  IMPRESSION: Chronic migraine.   Morbid obesity  PLAN: 1.  She has daily headaches for many years and has failed over 3 preventative medications.  She is candidate for Botox.  2.  Relpax 40mg  for abortive therapy 3.  Weight loss 4.  Follow up for first round of Botox 5.  Recommend that she receive a formal eye evaluation.  15 minutes spent face to face with patient, over 50% spent counseling.   Metta Clines, DO

## 2015-08-19 NOTE — Progress Notes (Signed)
Chart forwarded to Junius Finner.

## 2015-08-19 NOTE — Patient Instructions (Signed)
Migraine Recommendations: 1.  We will send in a request to Medicaid for pre-approval for Botox 2.  Take Relapax (eletriptan) 40mg  at earliest onset of headache.  May repeat dose once in 2 hours if needed.  Do not exceed two tablets in 24 hours. 3.  Limit use of pain relievers to no more than 2 days out of the week.  These medications include acetaminophen, ibuprofen, triptans and narcotics.  This will help reduce risk of rebound headaches. 4.  Be aware of common food triggers such as processed sweets, processed foods with nitrites (such as deli meat, hot dogs, sausages), foods with MSG, alcohol (such as wine), chocolate, certain cheeses, certain fruits (dried fruits, some citrus fruit), vinegar, diet soda. 4.  Avoid caffeine 5.  Routine exercise 6.  Proper sleep hygiene 7.  Stay adequately hydrated with water 8.  Keep a headache diary. 9.  Maintain proper stress management. 10.  Do not skip meals. 11.  Consider supplements:  Magnesium oxide 400mg  to 600mg  daily, riboflavin 400mg , Coenzyme Q 10 100mg  three times daily 12.  RECOMMEND GETTING AN EYE EXAM 13.  Follow up for Botox

## 2015-08-24 ENCOUNTER — Encounter (HOSPITAL_COMMUNITY): Payer: Self-pay | Admitting: *Deleted

## 2015-08-24 ENCOUNTER — Inpatient Hospital Stay (HOSPITAL_COMMUNITY)
Admission: AD | Admit: 2015-08-24 | Discharge: 2015-08-24 | Disposition: A | Discharge status: Home / Self Care | Payer: Medicaid Other | Source: Ambulatory Visit | Attending: Obstetrics and Gynecology | Admitting: Obstetrics and Gynecology

## 2015-08-24 DIAGNOSIS — R197 Diarrhea, unspecified: Secondary | ICD-10-CM | POA: Diagnosis present

## 2015-08-24 DIAGNOSIS — R5383 Other fatigue: Secondary | ICD-10-CM | POA: Diagnosis not present

## 2015-08-24 DIAGNOSIS — O9962 Diseases of the digestive system complicating childbirth: Secondary | ICD-10-CM | POA: Diagnosis not present

## 2015-08-24 DIAGNOSIS — K219 Gastro-esophageal reflux disease without esophagitis: Secondary | ICD-10-CM | POA: Insufficient documentation

## 2015-08-24 DIAGNOSIS — Z9889 Other specified postprocedural states: Secondary | ICD-10-CM | POA: Insufficient documentation

## 2015-08-24 DIAGNOSIS — Z880 Allergy status to penicillin: Secondary | ICD-10-CM | POA: Diagnosis not present

## 2015-08-24 DIAGNOSIS — R51 Headache: Secondary | ICD-10-CM | POA: Diagnosis not present

## 2015-08-24 DIAGNOSIS — R112 Nausea with vomiting, unspecified: Secondary | ICD-10-CM | POA: Diagnosis present

## 2015-08-24 LAB — COMPREHENSIVE METABOLIC PANEL
ALBUMIN: 4.7 g/dL (ref 3.5–5.0)
ALK PHOS: 61 U/L (ref 38–126)
ALT: 18 U/L (ref 14–54)
AST: 19 U/L (ref 15–41)
Anion gap: 6 (ref 5–15)
BILIRUBIN TOTAL: 0.6 mg/dL (ref 0.3–1.2)
BUN: 9 mg/dL (ref 6–20)
CALCIUM: 9.6 mg/dL (ref 8.9–10.3)
CO2: 26 mmol/L (ref 22–32)
Chloride: 105 mmol/L (ref 101–111)
Creatinine, Ser: 0.66 mg/dL (ref 0.44–1.00)
GFR calc Af Amer: >60 mL/min (ref >60)
GFR calc non Af Amer: >60 mL/min (ref >60)
GLUCOSE: 76 mg/dL (ref 65–99)
POTASSIUM: 4 mmol/L (ref 3.5–5.1)
SODIUM: 137 mmol/L (ref 135–145)
TOTAL PROTEIN: 8.1 g/dL (ref 6.5–8.1)

## 2015-08-24 LAB — URINALYSIS, ROUTINE W REFLEX MICROSCOPIC
Bilirubin Urine: NEGATIVE
Glucose, UA: NEGATIVE mg/dL
Hgb urine dipstick: NEGATIVE
Ketones, ur: NEGATIVE mg/dL
LEUKOCYTES UA: NEGATIVE
NITRITE: NEGATIVE
PROTEIN: NEGATIVE mg/dL
pH: 5.5 (ref 5.0–8.0)

## 2015-08-24 LAB — CBC
HEMATOCRIT: 38.5 % (ref 36.0–46.0)
HEMOGLOBIN: 12.7 g/dL (ref 12.0–15.0)
MCH: 25.3 pg — ABNORMAL LOW (ref 26.0–34.0)
MCHC: 33 g/dL (ref 30.0–36.0)
MCV: 76.7 fL — ABNORMAL LOW (ref 78.0–100.0)
Platelets: 126 10*3/uL — ABNORMAL LOW (ref 150–400)
RBC: 5.02 MIL/uL (ref 3.87–5.11)
RDW: 16.5 % — ABNORMAL HIGH (ref 11.5–15.5)
WBC: 11.1 10*3/uL — AB (ref 4.0–10.5)

## 2015-08-24 LAB — POCT PREGNANCY, URINE: PREG TEST UR: NEGATIVE

## 2015-08-24 MED ORDER — METOCLOPRAMIDE HCL 5 MG/ML IJ SOLN
10.0000 mg | Freq: Once | INTRAMUSCULAR | Status: AC
  Administered 2015-08-24: 10 mg via INTRAVENOUS
  Filled 2015-08-24: qty 2

## 2015-08-24 MED ORDER — DEXAMETHASONE SODIUM PHOSPHATE 10 MG/ML IJ SOLN
10.0000 mg | Freq: Once | INTRAMUSCULAR | Status: AC
  Administered 2015-08-24: 10 mg via INTRAVENOUS
  Filled 2015-08-24: qty 1

## 2015-08-24 MED ORDER — DIPHENHYDRAMINE HCL 50 MG/ML IJ SOLN
25.0000 mg | Freq: Once | INTRAMUSCULAR | Status: AC
  Administered 2015-08-24: 25 mg via INTRAVENOUS
  Filled 2015-08-24: qty 1

## 2015-08-24 MED ORDER — SODIUM CHLORIDE 0.9 % IV BOLUS (SEPSIS)
1000.0000 mL | Freq: Once | INTRAVENOUS | Status: AC
  Administered 2015-08-24: 1000 mL via INTRAVENOUS

## 2015-08-24 NOTE — MAU Note (Signed)
Feels awful.  Been feeling awful for about a wk. Can't really eat or drink anything, nauseated.  Has headache(not unusual).  Having diarrhea all weekend.  (loose and chunky) No one else at home is sick.

## 2015-08-24 NOTE — MAU Provider Note (Signed)
Chief Complaint: Emesis; Diarrhea; Nausea; Headache; and Fatigue   None     SUBJECTIVE HPI: Amanda [name] is a 35 y.o. G2P1011 who presents to maternity admissions reporting onset of nausea, vomiting occasionally, fatigue, and moderate h/a x 1 week. She reports hx of chronic migraine and reports this h/a is milder than usual for her migraines, rating it 5/10 on pain scale but the nausea is different and has been difficult to manage.  She reports trying to eat bland foods and drink plenty of fluids but constantly feels nauseous.  Her symptoms have been constant and unchanged since onset.  She denies abdominal pain or gyn symtpoms. She denies vaginal bleeding, vaginal itching/burning, urinary symptoms, dizziness, n/v, or fever/chills.     HPI  Past Medical History:  Diagnosis Date  . Acid reflux   . Allergy   . Anginal pain ([place])   . Anxiety   . Asthma   . Constipation   . Depression   . Dysrhythmia    Palpatations  . Environmental allergies   . Genital warts   . GERD (gastroesophageal reflux disease)   . H/O hiatal hernia   . Headache(784.0)   . Heart murmur   . MRSA cellulitis    Past Surgical History:  Procedure Laterality Date  . CERVICAL CONE BIOPSY    . CESAREAN SECTION  01/20/2012   Procedure: CESAREAN SECTION;  Surgeon: Melina Schools, MD;  Location: [facility];  Service: Obstetrics;  Laterality: N/A;  . COLONOSCOPY    . INSERTION OF MESH N/A 05/15/2015   Procedure: INSERTION OF MESH;  Surgeon: Ralene Ok, MD;  Location: [facility];  Service: General;  Laterality: N/A;  . MARSUPIALIZATION URETHRAL DIVERTICULUM    . UMBILICAL HERNIA REPAIR N/A 05/15/2015   Procedure: LAPAROSCOPIC UMBILICAL HERNIA;  Surgeon: Ralene Ok, MD;  Location: [facility];  Service: General;  Laterality: N/A;  . UPPER GI ENDOSCOPY     Social History   Social History  . Marital status: Single    Spouse name: N/A  . Number of children: 1  . Years of education: 12+   Occupational  History  . Not on file.   Social History Main Topics  . Smoking status: Never Smoker  . Smokeless tobacco: Never Used  . Alcohol use No  . Drug use: No  . Sexual activity: Yes    Partners: Male    Birth control/ protection: None     Comment: last intercourse last night   Other Topics Concern  . Not on file   Social History Narrative   Regular exercise-no   Caffeine Use-yes   No current facility-administered medications on file prior to encounter.    Current Outpatient Prescriptions on File Prior to Encounter  Medication Sig Dispense Refill  . albuterol (PROVENTIL HFA;VENTOLIN HFA) 108 (90 Base) MCG/ACT inhaler Inhale 2 puffs into the lungs every 4 (four) hours as needed for wheezing or shortness of breath. 1 Inhaler 0  . cyclobenzaprine (FLEXERIL) 10 MG tablet May use every 8 hours for headache (Patient taking differently: Take 10 mg by mouth every 8 (eight) hours as needed (Headache). May use every 8 hours for headache) 60 tablet 4  . eletriptan (RELPAX) 40 MG tablet Take 1tab at earliest onset of headache.  May repeat x1 in 2 hours if headache persists or recurs. 10 tablet 3   Allergies  Allergen Reactions  . Doxycycline Hyclate Hives, Itching and Swelling    REACTION: swelling of face  . Ketorolac Tromethamine Other (  See Comments)    Patient stated that it makes her extremely hyper  . Amoxicillin Swelling and Rash    Has patient had a PCN reaction causing immediate rash, facial/tongue/throat swelling, SOB or lightheadedness with hypotension: Unsure Has patient had a PCN reaction causing severe rash involving mucus membranes or skin necrosis: Unsure Has patient had a PCN reaction that required hospitalization No Has patient had a PCN reaction occurring within the last 10 years: No If all of the above answers are "NO", then may proceed with Cephalosporin use.REACTION: facial swelling and rash.  . Doxycycline Rash    ROS:  Review of Systems  Constitutional: Positive for  appetite change and fatigue. Negative for chills and fever.  Respiratory: Negative for shortness of breath.   Cardiovascular: Negative for chest pain.  Gastrointestinal: Positive for nausea and vomiting.  Genitourinary: Negative for difficulty urinating, dysuria, flank pain, pelvic pain, vaginal bleeding, vaginal discharge and vaginal pain.  Neurological: Positive for headaches. Negative for dizziness.  Psychiatric/Behavioral: Negative.      I have reviewed patient's Past Medical Hx, Surgical Hx, Family Hx, Social Hx, medications and allergies.   Physical Exam  Patient Vitals for the past 24 hrs:  BP Temp Temp src Pulse Resp Height Weight  08/24/15 1921 138/91 - - - - - -  08/24/15 1802 141/92 98.7 F (37.1 C) Oral 62 18 5' 1.5" (1.562 m) 269 lb 6.4 oz (122.2 kg)   Constitutional: Well-developed, well-nourished female in no acute distress.  Cardiovascular: normal rate Respiratory: normal effort GI: Abd soft, non-tender. Pos BS x 4 MS: Extremities nontender, no edema, normal ROM Neurologic: Alert and oriented x 4.  GU: Neg CVAT.  PELVIC EXAM: Deferred  LAB RESULTS Results for orders placed or performed during the hospital encounter of 08/24/15 (from the past 24 hour(s))  Urinalysis, Routine w reflex microscopic (not at [facility])     Status: Abnormal   Collection Time: 08/24/15  6:05 PM  Result Value Ref Range   Color, Urine YELLOW YELLOW   APPearance CLEAR CLEAR   Specific Gravity, Urine <1.005 (L) 1.005 - 1.030   pH 5.5 5.0 - 8.0   Glucose, UA NEGATIVE NEGATIVE mg/dL   Hgb urine dipstick NEGATIVE NEGATIVE   Bilirubin Urine NEGATIVE NEGATIVE   Ketones, ur NEGATIVE NEGATIVE mg/dL   Protein, ur NEGATIVE NEGATIVE mg/dL   Nitrite NEGATIVE NEGATIVE   Leukocytes, UA NEGATIVE NEGATIVE  Pregnancy, urine POC     Status: None   Collection Time: 08/24/15  6:12 PM  Result Value Ref Range   Preg Test, Ur NEGATIVE NEGATIVE       IMAGING No results found.  MAU  Management/MDM: Ordered CBC, CMP, headache cocktail (NS x 1000 ml, Reglan 10 mg IV, Benadryl 25 mg IV, and decadron 10 mg IV). No Gyn concerns today.  Likely migraine vs mild gastroenteritis.    Report to Marcille Buffy, CNM   Fatima Blank Certified Nurse-Midwife 08/24/2015  7:49 PM

## 2015-09-02 NOTE — Telephone Encounter (Signed)
Opened in error

## 2015-09-16 ENCOUNTER — Other Ambulatory Visit: Payer: Self-pay | Admitting: Internal Medicine

## 2015-09-16 DIAGNOSIS — R519 Headache, unspecified: Secondary | ICD-10-CM

## 2015-09-16 DIAGNOSIS — R51 Headache: Principal | ICD-10-CM

## 2015-09-21 ENCOUNTER — Telehealth: Payer: Self-pay | Admitting: Neurology

## 2015-09-21 NOTE — Telephone Encounter (Signed)
[facility] called for patient's Botox that you have to use form [phone]. She would not fax form to me - you have to get it off the [facility] website. Please fill out and resubmit.

## 2015-09-21 NOTE — Telephone Encounter (Signed)
Patient needs a Amanda [name] done for her refill on her medication please call patient at [phone]

## 2015-09-22 NOTE — Telephone Encounter (Signed)
Working on [employer].  Patient notified.

## 2015-09-29 NOTE — Telephone Encounter (Signed)
Resubmitted as requested.

## 2015-10-01 ENCOUNTER — Ambulatory Visit: Payer: Medicaid Other | Admitting: Neurology

## 2015-10-02 ENCOUNTER — Ambulatory Visit: Payer: Medicaid Other | Admitting: Neurology

## 2015-10-15 ENCOUNTER — Ambulatory Visit: Payer: Medicaid Other | Admitting: Neurology

## 2015-10-19 ENCOUNTER — Other Ambulatory Visit: Payer: Self-pay | Admitting: Internal Medicine

## 2015-10-19 DIAGNOSIS — R519 Headache, unspecified: Secondary | ICD-10-CM

## 2015-10-19 DIAGNOSIS — R51 Headache: Principal | ICD-10-CM

## 2015-10-20 ENCOUNTER — Telehealth: Payer: Self-pay | Admitting: Internal Medicine

## 2015-10-20 NOTE — Telephone Encounter (Signed)
Migraine medications has been managed by Dr. Tomi Likens. Will forward request to Neurology to determine if refill is appropriate until patient is established with new provider here.

## 2015-10-20 NOTE — Telephone Encounter (Signed)
Patient called the office to request medication refill for cyclobenzaprine (FLEXERIL) 10 MG tablet. Pt schedule an appt for 10/6 with new provider. Pt cannot wait until then to get medication. Please follow up.   Thank you.

## 2015-10-21 ENCOUNTER — Other Ambulatory Visit: Payer: Self-pay | Admitting: *Deleted

## 2015-10-21 DIAGNOSIS — R519 Headache, unspecified: Secondary | ICD-10-CM

## 2015-10-21 DIAGNOSIS — R51 Headache: Principal | ICD-10-CM

## 2015-10-21 MED ORDER — CYCLOBENZAPRINE HCL 10 MG PO TABS: ORAL_TABLET | ORAL | 4 refills | Status: DC

## 2015-10-21 NOTE — Telephone Encounter (Signed)
Rx sent 

## 2015-10-21 NOTE — Telephone Encounter (Signed)
Amanda [name], please refill cyclobenzaprine for [name].  Thank you.

## 2015-10-30 ENCOUNTER — Ambulatory Visit: Payer: Medicaid Other | Attending: Family Medicine | Admitting: Family Medicine

## 2015-10-30 ENCOUNTER — Encounter: Payer: Self-pay | Admitting: Family Medicine

## 2015-10-30 VITALS — BP 113/74 | HR 82 | Temp 98.5°F | Ht 63.0 in | Wt 278.4 lb

## 2015-10-30 DIAGNOSIS — J45909 Unspecified asthma, uncomplicated: Secondary | ICD-10-CM | POA: Diagnosis not present

## 2015-10-30 DIAGNOSIS — R1013 Epigastric pain: Secondary | ICD-10-CM

## 2015-10-30 DIAGNOSIS — K219 Gastro-esophageal reflux disease without esophagitis: Secondary | ICD-10-CM

## 2015-10-30 DIAGNOSIS — G43719 Chronic migraine without aura, intractable, without status migrainosus: Secondary | ICD-10-CM

## 2015-10-30 DIAGNOSIS — Z6841 Body Mass Index (BMI) 40.0 and over, adult: Secondary | ICD-10-CM | POA: Insufficient documentation

## 2015-10-30 DIAGNOSIS — K210 Gastro-esophageal reflux disease with esophagitis: Secondary | ICD-10-CM | POA: Insufficient documentation

## 2015-10-30 DIAGNOSIS — A048 Other specified bacterial intestinal infections: Secondary | ICD-10-CM | POA: Diagnosis not present

## 2015-10-30 DIAGNOSIS — Z0000 Encounter for general adult medical examination without abnormal findings: Secondary | ICD-10-CM | POA: Insufficient documentation

## 2015-10-30 DIAGNOSIS — G43709 Chronic migraine without aura, not intractable, without status migrainosus: Secondary | ICD-10-CM | POA: Diagnosis not present

## 2015-10-30 MED ORDER — OMEPRAZOLE 20 MG PO CPDR: 40.0000 mg | DELAYED_RELEASE_CAPSULE | Freq: Two times a day (BID) | ORAL | 2 refills | Status: DC

## 2015-10-30 NOTE — Assessment & Plan Note (Signed)
Chronic migraines Awaiting botox Advised patient f/u with neurology

## 2015-10-30 NOTE — Progress Notes (Signed)
Subjective:  Patient ID: Amanda [name], female    DOB: 11/08/1980  Age: 35 y.o. MRN: [id number]  CC: Establish Care   HPI ZOBIA MATTERS has asthma, morbid obesity, chronic migraines followed by neurology she presents for   1.  Stomach pain: x 2 weeks. stabbing pain in stomach when she eats anything other than soups or tuna. No nausea or emesis. No fever or chills. Still has gallbladder. No hx of gallstones. She has hx of chronic abdominal pain and has been diagnosed with GERD, hiatal hernia and possible H. Pylori which she does not believe was treated.   2. Migraines: chronic, followed by neurology. Requesting flexeril refill. Awaiting approval for botox injections. Has never has recommended dilated eye exam.   Past Surgical History:  Procedure Laterality Date  . CERVICAL CONE BIOPSY    . CESAREAN SECTION  01/20/2012   Procedure: CESAREAN SECTION;  Surgeon: Melina Schools, MD;  Location: [facility];  Service: Obstetrics;  Laterality: N/A;  . COLONOSCOPY    . INSERTION OF MESH N/A 05/15/2015   Procedure: INSERTION OF MESH;  Surgeon: Ralene Ok, MD;  Location: [facility];  Service: General;  Laterality: N/A;  . MARSUPIALIZATION URETHRAL DIVERTICULUM    . UMBILICAL HERNIA REPAIR N/A 05/15/2015   Procedure: LAPAROSCOPIC UMBILICAL HERNIA;  Surgeon: Ralene Ok, MD;  Location: [facility];  Service: General;  Laterality: N/A;  . UPPER GI ENDOSCOPY      Social History  Substance Use Topics  . Smoking status: Never Smoker  . Smokeless tobacco: Never Used  . Alcohol use No    Outpatient Medications Prior to Visit  Medication Sig Dispense Refill  . albuterol (PROVENTIL HFA;VENTOLIN HFA) 108 (90 Base) MCG/ACT inhaler Inhale 2 puffs into the lungs every 4 (four) hours as needed for wheezing or shortness of breath. 1 Inhaler 0  . cyclobenzaprine (FLEXERIL) 10 MG tablet May use every 8 hours for headache 60 tablet 4  . eletriptan (RELPAX) 40 MG tablet Take 1tab at earliest  onset of headache.  May repeat x1 in 2 hours if headache persists or recurs. (Patient not taking: Reported on 10/30/2015) 10 tablet 3   No facility-administered medications prior to visit.     ROS Review of Systems  Constitutional: Negative for chills and fever.  Eyes: Negative for visual disturbance.  Respiratory: Negative for shortness of breath.   Cardiovascular: Negative for chest pain.  Gastrointestinal: Positive for abdominal pain. Negative for blood in stool.  Musculoskeletal: Negative for arthralgias and back pain.  Skin: Negative for rash.  Allergic/Immunologic: Negative for immunocompromised state.  Neurological: Positive for headaches.  Hematological: Negative for adenopathy. Does not bruise/bleed easily.  Psychiatric/Behavioral: Negative for dysphoric mood and suicidal ideas.    Objective:  BP 113/74 (BP Location: Left Arm, Patient Position: Sitting, Cuff Size: Large)   Pulse 82   Temp 98.5 F (36.9 C) (Oral)   Ht 5\' 3"  (1.6 m)   Wt 278 lb 6.4 oz (126.3 kg)   SpO2 100%   BMI 49.32 kg/m   BP/Weight 10/30/2015 08/24/2015 [id number]  Systolic BP 123456 0000000 [id number]  Diastolic BP 74 91 66  Wt. (Lbs) 278.4 269.4 -  BMI 49.32 50.08 -    Physical Exam  Constitutional: She is oriented to person, place, and time. She appears well-developed and well-nourished. No distress.  Obese   HENT:  Head: Normocephalic and atraumatic.  Cardiovascular: Normal rate, regular rhythm, normal heart sounds and intact distal pulses.   Pulmonary/Chest: Effort normal and  breath sounds normal.  Abdominal: Soft. Bowel sounds are normal. She exhibits no distension and no mass. There is no tenderness. There is no rebound and no guarding.  Obese   Musculoskeletal: She exhibits no edema.  Neurological: She is alert and oriented to person, place, and time.  Skin: Skin is warm and dry. No rash noted.  Psychiatric: She has a normal mood and affect.     Assessment & Plan:  Sedina was seen today for  establish care.  Diagnoses and all orders for this visit:  Intractable chronic migraine without aura and without status migrainosus -     Ambulatory referral to Ophthalmology  Abdominal pain, epigastric -     US Abdomen Complete; Future -     H. pylori breath test  Gastroesophageal reflux disease, esophagitis presence not specified -     omeprazole (PRILOSEC) 20 MG capsule; Take 2 capsules (40 mg total) by mouth 2 (two) times daily.   There are no diagnoses linked to this encounter.  No orders of the defined types were placed in this encounter.   Follow-up: Return in about 4 weeks (around 11/27/2015) for pap smear .   Boykin Nearing MD

## 2015-10-30 NOTE — Assessment & Plan Note (Signed)
A: abdominal pain x 2 weeks in setting of hx of chronic abdominal pain P: PPI for known GERD H pylori breath test Abdominal ultrasound to r/o gallstones

## 2015-10-30 NOTE — Patient Instructions (Signed)
Elder was seen today for establish care.  Diagnoses and all orders for this visit:  Intractable chronic migraine without aura and without status migrainosus -     Ambulatory referral to Ophthalmology  Abdominal pain, epigastric -     US Abdomen Complete; Future -     H. pylori breath test  Gastroesophageal reflux disease, esophagitis presence not specified -     omeprazole (PRILOSEC) 20 MG capsule; Take 2 capsules (40 mg total) by mouth 2 (two) times daily.    You are due for screening pap smear  F/u in 3-4 weeks for pap smear   Dr. Adrian Blackwater

## 2015-10-30 NOTE — Progress Notes (Signed)
Pt declined flu shot °

## 2015-11-02 DIAGNOSIS — A048 Other specified bacterial intestinal infections: Secondary | ICD-10-CM | POA: Insufficient documentation

## 2015-11-02 LAB — H. PYLORI BREATH TEST: H. PYLORI BREATH TEST: DETECTED — AB

## 2015-11-02 MED ORDER — METRONIDAZOLE 500 MG PO TABS: 500.0000 mg | ORAL_TABLET | Freq: Two times a day (BID) | ORAL | 0 refills | Status: DC

## 2015-11-02 MED ORDER — CLARITHROMYCIN 500 MG PO TABS: 500.0000 mg | ORAL_TABLET | Freq: Two times a day (BID) | ORAL | 0 refills | Status: DC

## 2015-11-02 NOTE — Addendum Note (Signed)
Addended by: Boykin Nearing on: 11/02/2015 06:14 PM   Modules accepted: Orders

## 2015-11-03 ENCOUNTER — Telehealth: Payer: Self-pay | Admitting: *Deleted

## 2015-11-03 ENCOUNTER — Telehealth: Payer: Self-pay | Admitting: Family Medicine

## 2015-11-03 NOTE — Telephone Encounter (Signed)
Pt called to schedule 2 week f/u for hpylori breath test, pt would like PCP to contact her has some questions, pt is scheduled 10/27.

## 2015-11-03 NOTE — Telephone Encounter (Signed)
MA left a voice message informing the patient of H Pylori being positive and patient needing to obtain Flagyl and Clarithromycin from the [facility] pharmacy. Patient provided the call back number for any questions or concerns.

## 2015-11-03 NOTE — Telephone Encounter (Signed)
-----   Message from Boykin Nearing, MD sent at 11/02/2015  6:11 PM EDT ----- H pylori breath test was positive  Add flagyl and clarithromycin to treatment regimen

## 2015-11-04 NOTE — Telephone Encounter (Signed)
Will route to PCP 

## 2015-11-04 NOTE — Telephone Encounter (Signed)
Please ask patient what her questions are if they cannot wait until her f/u appt.

## 2015-11-05 NOTE — Telephone Encounter (Signed)
Called patient Left HIPPA compliant VM stating that I called her regarding her questions.  Requested she do the following regarding her questions 1. Send [employer], preferred 2. Call back 3. Hold non-urgent questions to her f/u appt

## 2015-11-16 ENCOUNTER — Ambulatory Visit (HOSPITAL_COMMUNITY): Payer: Medicaid Other

## 2015-11-17 ENCOUNTER — Telehealth: Payer: Self-pay | Admitting: Family Medicine

## 2015-11-17 ENCOUNTER — Encounter: Payer: Self-pay | Admitting: Family Medicine

## 2015-11-17 NOTE — Telephone Encounter (Signed)
Amanda [name] from [facility] called the office to speak with nurse regarding getting office notes that the health care manger is requesting for patient's visit on 10/6. Please fax notes to [phone]. Please follow up.

## 2015-11-18 NOTE — Telephone Encounter (Signed)
Paperwork will be faxed over on 10/25.

## 2015-11-20 ENCOUNTER — Ambulatory Visit: Payer: Medicaid Other | Admitting: Family Medicine

## 2015-12-15 ENCOUNTER — Telehealth: Payer: Self-pay | Admitting: Neurology

## 2015-12-15 ENCOUNTER — Encounter: Payer: Self-pay | Admitting: Family Medicine

## 2015-12-15 ENCOUNTER — Ambulatory Visit: Payer: Medicaid Other | Attending: Family Medicine | Admitting: Family Medicine

## 2015-12-15 VITALS — BP 112/79 | HR 88 | Temp 98.9°F | Ht 63.0 in | Wt 276.8 lb

## 2015-12-15 DIAGNOSIS — M545 Low back pain, unspecified: Secondary | ICD-10-CM

## 2015-12-15 DIAGNOSIS — G43709 Chronic migraine without aura, not intractable, without status migrainosus: Secondary | ICD-10-CM | POA: Insufficient documentation

## 2015-12-15 DIAGNOSIS — G43719 Chronic migraine without aura, intractable, without status migrainosus: Secondary | ICD-10-CM

## 2015-12-15 DIAGNOSIS — A048 Other specified bacterial intestinal infections: Secondary | ICD-10-CM | POA: Diagnosis not present

## 2015-12-15 DIAGNOSIS — K219 Gastro-esophageal reflux disease without esophagitis: Secondary | ICD-10-CM | POA: Diagnosis not present

## 2015-12-15 DIAGNOSIS — B9681 Helicobacter pylori [H. pylori] as the cause of diseases classified elsewhere: Secondary | ICD-10-CM | POA: Diagnosis not present

## 2015-12-15 MED ORDER — RIZATRIPTAN BENZOATE 10 MG PO TABS: 10.0000 mg | ORAL_TABLET | ORAL | 2 refills | Status: DC | PRN

## 2015-12-15 MED ORDER — METHOCARBAMOL 500 MG PO TABS: 500.0000 mg | ORAL_TABLET | Freq: Three times a day (TID) | ORAL | 0 refills | Status: DC | PRN

## 2015-12-15 MED ORDER — OMEPRAZOLE 20 MG PO CPDR: 20.0000 mg | DELAYED_RELEASE_CAPSULE | Freq: Every day | ORAL | 5 refills | Status: DC | PRN

## 2015-12-15 NOTE — Progress Notes (Signed)
Subjective:  Patient ID: Amanda [name], female    DOB: 05/05/1980  Age: 35 y.o. MRN: [id number]  CC: Gastroesophageal Reflux   HPI Amanda [name] presents for    1. F/u GERD/ H.pylor: she completed triple therapy. She has no nausea or emesis. Has heartburn at times. Overall her GI upset has improved.   2. Chronic migraines: awaiting approval for botox. Reports flexeril is not helping with headache pain at the moment. Also has chronic low back pain.   Social History  Substance Use Topics  . Smoking status: Never Smoker  . Smokeless tobacco: Never Used  . Alcohol use No    Outpatient Medications Prior to Visit  Medication Sig Dispense Refill  . albuterol (PROVENTIL HFA;VENTOLIN HFA) 108 (90 Base) MCG/ACT inhaler Inhale 2 puffs into the lungs every 4 (four) hours as needed for wheezing or shortness of breath. 1 Inhaler 0  . cyclobenzaprine (FLEXERIL) 10 MG tablet May use every 8 hours for headache 60 tablet 4  . omeprazole (PRILOSEC) 20 MG capsule Take 2 capsules (40 mg total) by mouth 2 (two) times daily. 60 capsule 2  . clarithromycin (BIAXIN) 500 MG tablet Take 1 tablet (500 mg total) by mouth 2 (two) times daily. (Patient not taking: Reported on 12/15/2015) 28 tablet 0  . metroNIDAZOLE (FLAGYL) 500 MG tablet Take 1 tablet (500 mg total) by mouth 2 (two) times daily. (Patient not taking: Reported on 12/15/2015) 28 tablet 0   No facility-administered medications prior to visit.     ROS Review of Systems  Constitutional: Negative for chills and fever.  Eyes: Negative for visual disturbance.  Respiratory: Negative for shortness of breath.   Cardiovascular: Negative for chest pain.  Gastrointestinal: Negative for abdominal pain and blood in stool.  Musculoskeletal: Positive for back pain. Negative for arthralgias.  Skin: Negative for rash.  Allergic/Immunologic: Negative for immunocompromised state.  Neurological: Positive for headaches.  Hematological:  Negative for adenopathy. Does not bruise/bleed easily.  Psychiatric/Behavioral: Negative for dysphoric mood and suicidal ideas.    Objective:  BP 112/79 (BP Location: Left Arm, Patient Position: Sitting, Cuff Size: Large)   Pulse 88   Temp 98.9 F (37.2 C) (Oral)   Ht 5\' 3"  (1.6 m)   Wt 276 lb 12.8 oz (125.6 kg)   LMP 12/09/2015   SpO2 100%   BMI 49.03 kg/m   BP/Weight 12/15/2015 10/30/2015 99991111  Systolic BP [id number] 0000000  Diastolic BP 79 74 91  Wt. (Lbs) 276.8 278.4 269.4  BMI 49.03 49.32 50.08   Physical Exam  Constitutional: She is oriented to person, place, and time. She appears well-developed and well-nourished. No distress.  Obese   HENT:  Head: Normocephalic and atraumatic.  Cardiovascular: Normal rate, regular rhythm, normal heart sounds and intact distal pulses.   Pulmonary/Chest: Effort normal and breath sounds normal.  Abdominal: Soft. Bowel sounds are normal. She exhibits no distension and no mass. There is no tenderness. There is no rebound and no guarding.  Musculoskeletal: She exhibits no edema.  Neurological: She is alert and oriented to person, place, and time.  Skin: Skin is warm and dry. No rash noted.  Psychiatric: She has a normal mood and affect.   Depression screen [facility] 2/9 12/15/2015 10/30/2015 01/21/2015  Decreased Interest 0 0 1  Down, Depressed, Hopeless 1 0 1  PHQ - 2 Score 1 0 2  Altered sleeping 0 2 3  Tired, decreased energy 1 0 2  Change in appetite 1 0 1  Feeling bad or failure about yourself  1 0 1  Trouble concentrating 1 0 1  Moving slowly or fidgety/restless 0 0 0  Suicidal thoughts 0 0 0  PHQ-9 Score 5 2 10    GAD 7 : Generalized Anxiety Score 12/15/2015 10/30/2015 01/21/2015  Nervous, Anxious, on Edge 1 2 2   Control/stop worrying 1 1 2   Worry too much - different things 1 2 3   Trouble relaxing 1 2 3   Restless 0 2 1  Easily annoyed or irritable 2 2 3   Afraid - awful might happen 0 0 1  Total GAD 7 Score 6 11 15       Assessment & Plan:  Amanda [name] was seen today for gastroesophageal reflux.  Diagnoses and all orders for this visit:  H. pylori infection -     H. pylori breath test  Gastroesophageal reflux disease, esophagitis presence not specified -     omeprazole (PRILOSEC) 20 MG capsule; Take 1 capsule (20 mg total) by mouth daily as needed.  Intractable chronic migraine without aura and without status migrainosus -     methocarbamol (ROBAXIN) 500 MG tablet; Take 1 tablet (500 mg total) by mouth every 8 (eight) hours as needed for muscle spasms.   There are no diagnoses linked to this encounter.  No orders of the defined types were placed in this encounter.   Follow-up: Return in about 3 months (around 03/16/2016) for GERD, tetanus shot .   Boykin Nearing MD

## 2015-12-15 NOTE — Telephone Encounter (Signed)
Answered all pt questions about Botox. Pt would like a different abortive therapy called in to pharmacy. Pt states Relpax is not helping relieve pain. Please advise.

## 2015-12-15 NOTE — Telephone Encounter (Signed)
Okay to try maxalt 5mg  - take 1 tab at headache onset, okay to repeat in 2 hours, #9, 0 refills.  Anita Laguna K. Posey Pronto, DO

## 2015-12-15 NOTE — Progress Notes (Signed)
Pt is here to follow up on hpylor breath test.

## 2015-12-15 NOTE — Telephone Encounter (Signed)
Has questions about Botox injections. Please call.

## 2015-12-15 NOTE — Patient Instructions (Addendum)
Diagnoses and all orders for this visit:  H. pylori infection -     H. pylori breath test  Gastroesophageal reflux disease, esophagitis presence not specified -     omeprazole (PRILOSEC) 20 MG capsule; Take 1 capsule (20 mg total) by mouth daily as needed.  Intractable chronic migraine without aura and without status migrainosus -     methocarbamol (ROBAXIN) 500 MG tablet; Take 1 tablet (500 mg total) by mouth every 8 (eight) hours as needed for muscle spasms.   Changed flexeril to robaxin  F/u in 3 months for GERD and tetanus shot   Dr. Adrian Blackwater

## 2015-12-16 DIAGNOSIS — M545 Low back pain, unspecified: Secondary | ICD-10-CM | POA: Insufficient documentation

## 2015-12-16 LAB — H. PYLORI BREATH TEST: H. PYLORI BREATH TEST: DETECTED — AB

## 2015-12-16 NOTE — Assessment & Plan Note (Signed)
Treated f/u breath test for test of cure

## 2015-12-16 NOTE — Assessment & Plan Note (Signed)
Chronic Changed muscle relaxer to robaxin

## 2015-12-16 NOTE — Assessment & Plan Note (Signed)
Persistent  Avoid triggers PPI

## 2015-12-23 ENCOUNTER — Other Ambulatory Visit: Payer: Self-pay | Admitting: Family Medicine

## 2015-12-23 DIAGNOSIS — K219 Gastro-esophageal reflux disease without esophagitis: Secondary | ICD-10-CM

## 2015-12-23 MED ORDER — METRONIDAZOLE 500 MG PO TABS: 500.0000 mg | ORAL_TABLET | Freq: Three times a day (TID) | ORAL | 0 refills | Status: DC

## 2015-12-23 MED ORDER — OMEPRAZOLE 20 MG PO CPDR: 20.0000 mg | DELAYED_RELEASE_CAPSULE | Freq: Two times a day (BID) | ORAL | 0 refills | Status: DC

## 2015-12-23 MED ORDER — CLINDAMYCIN HCL 300 MG PO CAPS: 300.0000 mg | ORAL_CAPSULE | Freq: Two times a day (BID) | ORAL | 0 refills | Status: DC

## 2015-12-29 ENCOUNTER — Encounter (HOSPITAL_COMMUNITY): Payer: Self-pay | Admitting: Family Medicine

## 2015-12-29 ENCOUNTER — Ambulatory Visit (HOSPITAL_COMMUNITY)
Admission: EM | Admit: 2015-12-29 | Discharge: 2015-12-29 | Disposition: A | Discharge status: Home / Self Care | Payer: Medicaid Other | Attending: Family Medicine | Admitting: Family Medicine

## 2015-12-29 DIAGNOSIS — J069 Acute upper respiratory infection, unspecified: Secondary | ICD-10-CM | POA: Diagnosis not present

## 2015-12-29 MED ORDER — IPRATROPIUM BROMIDE 0.06 % NA SOLN: 2.0000 | Freq: Four times a day (QID) | NASAL | 1 refills | Status: DC

## 2015-12-29 MED ORDER — HYDROCOD POLST-CPM POLST ER 10-8 MG/5ML PO SUER: 5.0000 mL | Freq: Two times a day (BID) | ORAL | 0 refills | Status: DC | PRN

## 2015-12-29 NOTE — Discharge Instructions (Signed)
Drink plenty of fluids as discussed, use medicine as prescribed, and mucinex or delsym for cough. Return or see your doctor if further problems °

## 2015-12-29 NOTE — ED Triage Notes (Signed)
Pt here for non productive cough and sts affecting her asthma. Using inhaler at home.

## 2015-12-29 NOTE — ED Provider Notes (Signed)
[facility]    CSN: [id number] Arrival date & time: 12/29/15  1847     History   Chief Complaint Chief Complaint  Patient presents with  . Cough    HPI Amanda [name] is a 35 y.o. female.   The history is provided by the patient.  Cough  Cough characteristics:  Non-productive, dry and hacking Severity:  Moderate Onset quality:  Sudden Duration:  12 hours Progression:  Unchanged Chronicity:  New Smoker: no   Context: upper respiratory infection   Relieved by:  None tried Worsened by:  Nothing Ineffective treatments:  None tried Associated symptoms: rhinorrhea   Associated symptoms: no chills, no fever, no shortness of breath, no sinus congestion and no sore throat     Past Medical History:  Diagnosis Date  . Acid reflux   . Allergy   . Anginal pain ([place])   . Anxiety   . Asthma   . Constipation   . Depression   . Dysrhythmia    Palpatations  . Environmental allergies   . Genital warts   . GERD (gastroesophageal reflux disease)   . H/O hiatal hernia   . Headache(784.0)   . Heart murmur   . MRSA cellulitis     Patient Active Problem List   Diagnosis Date Noted  . Low back pain 12/16/2015  . H. pylori infection 11/02/2015  . Intractable chronic migraine without aura and without status migrainosus 03/19/2015  . Morbid obesity ([place]) 03/19/2015  . Murmur 06/20/2012  . INSOMNIA 08/14/2009  . DYSMENORRHEA 12/09/2008  . FATIGUE 03/25/2008  . DEPRESSION/ANXIETY 09/11/2007  . HEADACHE 09/11/2007  . PALPITATIONS 02/23/2007  . CHEST PAIN 02/23/2007  . ALLERGIC RHINITIS 10/06/2006  . ASTHMA 10/06/2006  . GERD 10/06/2006    Past Surgical History:  Procedure Laterality Date  . CERVICAL CONE BIOPSY    . CESAREAN SECTION  01/20/2012   Procedure: CESAREAN SECTION;  Surgeon: Melina Schools, MD;  Location: [facility];  Service: Obstetrics;  Laterality: N/A;  . COLONOSCOPY    . INSERTION OF MESH N/A 05/15/2015   Procedure: INSERTION OF MESH;   Surgeon: Ralene Ok, MD;  Location: [facility];  Service: General;  Laterality: N/A;  . MARSUPIALIZATION URETHRAL DIVERTICULUM    . UMBILICAL HERNIA REPAIR N/A 05/15/2015   Procedure: LAPAROSCOPIC UMBILICAL HERNIA;  Surgeon: Ralene Ok, MD;  Location: [facility];  Service: General;  Laterality: N/A;  . UPPER GI ENDOSCOPY      OB History    Gravida Para Term Preterm AB Living   2 1 1  0 1 1   SAB TAB Ectopic Multiple Live Births   1 0 0 0 1       Home Medications    Prior to Admission medications   Medication Sig Start Date End Date Taking? Authorizing Provider  albuterol (PROVENTIL HFA;VENTOLIN HFA) 108 (90 Base) MCG/ACT inhaler Inhale 2 puffs into the lungs every 4 (four) hours as needed for wheezing or shortness of breath. 06/06/15   Merryl Hacker, MD  clindamycin (CLEOCIN) 300 MG capsule Take 1 capsule (300 mg total) by mouth 2 (two) times daily. 12/23/15   Arnoldo Morale, MD  cyclobenzaprine (FLEXERIL) 10 MG tablet May use every 8 hours for headache 10/21/15   Pieter Partridge, DO  methocarbamol (ROBAXIN) 500 MG tablet Take 1 tablet (500 mg total) by mouth every 8 (eight) hours as needed for muscle spasms. 12/15/15   Josalyn Funches, MD  metroNIDAZOLE (FLAGYL) 500 MG tablet Take 1 tablet (  500 mg total) by mouth 3 (three) times daily. 12/23/15   Arnoldo Morale, MD  omeprazole (PRILOSEC) 20 MG capsule Take 1 capsule (20 mg total) by mouth 2 (two) times daily before a meal. 12/23/15   Arnoldo Morale, MD  rizatriptan (MAXALT) 10 MG tablet Take 1 tablet (10 mg total) by mouth as needed for migraine. May repeat in 2 hours if needed 12/15/15   Pieter Partridge, DO    Family History Family History  Problem Relation Age of Onset  . Diabetes Mother   . Hypertension Mother   . Depression Mother   . Depression Father   . Heart disease Maternal Grandmother   . Breast cancer Maternal Aunt     [employer]  . Diabetes Maternal Grandfather   . Heart disease Maternal Grandfather   . Hypertension  Maternal Grandfather   . Anesthesia problems Neg Hx     Social History Social History  Substance Use Topics  . Smoking status: Never Smoker  . Smokeless tobacco: Never Used  . Alcohol use No     Allergies   Doxycycline hyclate; Ketorolac tromethamine; Amoxicillin; and Doxycycline   Review of Systems Review of Systems  Constitutional: Negative.  Negative for chills and fever.  HENT: Positive for congestion, postnasal drip and rhinorrhea. Negative for sore throat.   Respiratory: Positive for cough. Negative for shortness of breath.   Cardiovascular: Negative.   Gastrointestinal: Negative.   All other systems reviewed and are negative.    Physical Exam Triage Vital Signs ED Triage Vitals [12/29/15 1924]  Enc Vitals Group     BP 126/83     Pulse Rate 81     Resp 18     Temp 98.1 F (36.7 C)     Temp src      SpO2 100 %     Weight      Height      Head Circumference      Peak Flow      Pain Score      Pain Loc      Pain Edu?      Excl. in [place]?    No data found.   Updated Vital Signs BP 126/83   Pulse 81   Temp 98.1 F (36.7 C)   Resp 18   LMP 12/09/2015   SpO2 100%   Visual Acuity Right Eye Distance:   Left Eye Distance:   Bilateral Distance:    Right Eye Near:   Left Eye Near:    Bilateral Near:     Physical Exam  Constitutional: She is oriented to person, place, and time. She appears well-developed and well-nourished. No distress.  HENT:  Right Ear: External ear normal.  Left Ear: External ear normal.  Nose: Nose normal.  Mouth/Throat: Oropharynx is clear and moist.  Eyes: Pupils are equal, round, and reactive to light.  Neck: Normal range of motion. Neck supple.  Cardiovascular: Normal rate, regular rhythm, normal heart sounds and intact distal pulses.   Pulmonary/Chest: Effort normal and breath sounds normal.  Lymphadenopathy:    She has no cervical adenopathy.  Neurological: She is alert and oriented to person, place, and time.  Skin:  Skin is warm and dry.  Nursing note and vitals reviewed.    UC Treatments / Results  Labs (all labs ordered are listed, but only abnormal results are displayed) Labs Reviewed - No data to display  EKG  EKG Interpretation None       Radiology No results  found.  Procedures Procedures (including critical care time)  Medications Ordered in UC Medications - No data to display   Initial Impression / Assessment and Plan / UC Course  I have reviewed the triage vital signs and the nursing notes.  Pertinent labs & imaging results that were available during my care of the patient were reviewed by me and considered in my medical decision making (see chart for details).  Clinical Course       Final Clinical Impressions(s) / UC Diagnoses   Final diagnoses:  None    New Prescriptions New Prescriptions   No medications on file     Billy Fischer, MD 12/29/15 1949

## 2016-02-04 ENCOUNTER — Encounter (HOSPITAL_COMMUNITY): Payer: Self-pay

## 2016-02-04 ENCOUNTER — Emergency Department (HOSPITAL_COMMUNITY)
Admission: EM | Admit: 2016-02-04 | Discharge: 2016-02-04 | Disposition: A | Discharge status: Home / Self Care | Payer: Medicaid Other | Attending: Emergency Medicine | Admitting: Emergency Medicine

## 2016-02-04 DIAGNOSIS — J45909 Unspecified asthma, uncomplicated: Secondary | ICD-10-CM | POA: Insufficient documentation

## 2016-02-04 DIAGNOSIS — M79602 Pain in left arm: Secondary | ICD-10-CM

## 2016-02-04 DIAGNOSIS — M79622 Pain in left upper arm: Secondary | ICD-10-CM | POA: Insufficient documentation

## 2016-02-04 DIAGNOSIS — Z79899 Other long term (current) drug therapy: Secondary | ICD-10-CM | POA: Diagnosis not present

## 2016-02-04 MED ORDER — NAPROXEN 500 MG PO TABS: 500.0000 mg | ORAL_TABLET | Freq: Two times a day (BID) | ORAL | 0 refills | Status: DC

## 2016-02-04 NOTE — Discharge Instructions (Signed)
Please read and follow all provided instructions.  Your diagnoses today include:  1. Left arm pain     Tests performed today include:  Vital signs. See below for your results today.   Medications prescribed:   Naproxen - anti-inflammatory pain medication  Do not exceed 500mg  naproxen every 12 hours, take with food  You have been prescribed an anti-inflammatory medication or NSAID. Take with food. Take smallest effective dose for the shortest duration needed for your pain. Stop taking if you experience stomach pain or vomiting.   Take any prescribed medications only as directed.  Home care instructions:   Follow any educational materials contained in this packet  Follow R.I.C.E. Protocol:  R - rest your injury   I  - use ice on injury without applying directly to skin  C - compress injury with bandage or splint  E - elevate the injury as much as possible  Follow-up instructions: Please follow-up with your primary care provider if you continue to have significant pain in 2 weeks.   Return instructions:   Please return if your fingers are numb or tingling, appear gray or blue, or you have severe pain (also elevate the arm and loosen splint or wrap if you were given one)  Please return to the Emergency Department if you experience worsening symptoms.   Please return if you have any other emergent concerns.  Additional Information:  Your vital signs today were: BP 127/68 (BP Location: Right Arm)    Pulse 109    Temp 98.1 F (36.7 C) (Oral)    Resp 16    LMP 01/08/2016 (Exact Date)    SpO2 100%  If your blood pressure (BP) was elevated above 135/85 this visit, please have this repeated by your doctor within one month. --------------

## 2016-02-04 NOTE — ED Provider Notes (Signed)
[facility] DEPT Provider Note   CSN: [id number] Arrival date & time: 02/04/16  [id number]  By signing my name below, I, Higinio Plan, attest that this documentation has been prepared under the direction and in the presence of Josh Ednah Hammock PA-C . Electronically Signed: Higinio Plan, Scribe. 02/04/2016. 10:24 AM.  History   Chief Complaint Chief Complaint  Patient presents with  . Arm Pain   The history is provided by the patient. No language interpreter was used.   HPI Comments: Amanda [name] is a 36 y.o. female with PMHx of asthma, who presents to the Emergency Department complaining of gradually worsening, left arm pain that began ~1 month ago. Pt reports she initially experienced pain in her left elbow that has now resolved but states she is now experiencing "soreness" in her left bicep. She describes her pain as a "stretched out rubber band." Pt notes she has applied heat to her arm and taken Ibuprofen with mild relief. She denies any injury to her left arm, repetitive movements or recent heavy lifting, and numbness or weakness in her extremities.   Past Medical History:  Diagnosis Date  . Acid reflux   . Allergy   . Anginal pain ([place])   . Anxiety   . Asthma   . Constipation   . Depression   . Dysrhythmia    Palpatations  . Environmental allergies   . Genital warts   . GERD (gastroesophageal reflux disease)   . H/O hiatal hernia   . Headache(784.0)   . Heart murmur   . MRSA cellulitis     Patient Active Problem List   Diagnosis Date Noted  . Low back pain 12/16/2015  . H. pylori infection 11/02/2015  . Intractable chronic migraine without aura and without status migrainosus 03/19/2015  . Morbid obesity ([place]) 03/19/2015  . Murmur 06/20/2012  . INSOMNIA 08/14/2009  . DYSMENORRHEA 12/09/2008  . FATIGUE 03/25/2008  . DEPRESSION/ANXIETY 09/11/2007  . HEADACHE 09/11/2007  . PALPITATIONS 02/23/2007  . CHEST PAIN 02/23/2007  . ALLERGIC RHINITIS 10/06/2006  . ASTHMA  10/06/2006  . GERD 10/06/2006    Past Surgical History:  Procedure Laterality Date  . CERVICAL CONE BIOPSY    . CESAREAN SECTION  01/20/2012   Procedure: CESAREAN SECTION;  Surgeon: Melina Schools, MD;  Location: [facility];  Service: Obstetrics;  Laterality: N/A;  . COLONOSCOPY    . INSERTION OF MESH N/A 05/15/2015   Procedure: INSERTION OF MESH;  Surgeon: Ralene Ok, MD;  Location: [facility];  Service: General;  Laterality: N/A;  . MARSUPIALIZATION URETHRAL DIVERTICULUM    . UMBILICAL HERNIA REPAIR N/A 05/15/2015   Procedure: LAPAROSCOPIC UMBILICAL HERNIA;  Surgeon: Ralene Ok, MD;  Location: [facility];  Service: General;  Laterality: N/A;  . UPPER GI ENDOSCOPY      OB History    Gravida Para Term Preterm AB Living   2 1 1  0 1 1   SAB TAB Ectopic Multiple Live Births   1 0 0 0 1      Home Medications    Prior to Admission medications   Medication Sig Start Date End Date Taking? Authorizing Provider  albuterol (PROVENTIL HFA;VENTOLIN HFA) 108 (90 Base) MCG/ACT inhaler Inhale 2 puffs into the lungs every 4 (four) hours as needed for wheezing or shortness of breath. 06/06/15   Merryl Hacker, MD  chlorpheniramine-HYDROcodone (TUSSIONEX PENNKINETIC ER) 10-8 MG/5ML SUER Take 5 mLs by mouth every 12 (twelve) hours as needed for cough. 12/29/15   Nelda Severe  Kindl, MD  clindamycin (CLEOCIN) 300 MG capsule Take 1 capsule (300 mg total) by mouth 2 (two) times daily. 12/23/15   Arnoldo Morale, MD  cyclobenzaprine (FLEXERIL) 10 MG tablet May use every 8 hours for headache 10/21/15   Pieter Partridge, DO  ipratropium (ATROVENT) 0.06 % nasal spray Place 2 sprays into both nostrils 4 (four) times daily. 12/29/15   Billy Fischer, MD  methocarbamol (ROBAXIN) 500 MG tablet Take 1 tablet (500 mg total) by mouth every 8 (eight) hours as needed for muscle spasms. 12/15/15   Josalyn Funches, MD  metroNIDAZOLE (FLAGYL) 500 MG tablet Take 1 tablet (500 mg total) by mouth 3 (three) times daily. 12/23/15   Arnoldo Morale, MD  omeprazole (PRILOSEC) 20 MG capsule Take 1 capsule (20 mg total) by mouth 2 (two) times daily before a meal. 12/23/15   Arnoldo Morale, MD  rizatriptan (MAXALT) 10 MG tablet Take 1 tablet (10 mg total) by mouth as needed for migraine. May repeat in 2 hours if needed 12/15/15   Pieter Partridge, DO    Family History Family History  Problem Relation Age of Onset  . Diabetes Mother   . Hypertension Mother   . Depression Mother   . Depression Father   . Heart disease Maternal Grandmother   . Breast cancer Maternal Aunt     [employer]  . Diabetes Maternal Grandfather   . Heart disease Maternal Grandfather   . Hypertension Maternal Grandfather   . Anesthesia problems Neg Hx     Social History Social History  Substance Use Topics  . Smoking status: Never Smoker  . Smokeless tobacco: Never Used  . Alcohol use No     Allergies   Doxycycline hyclate; Ketorolac tromethamine; Amoxicillin; and Doxycycline   Review of Systems Review of Systems  Constitutional: Negative for activity change.  Musculoskeletal: Positive for arthralgias and myalgias. Negative for back pain, joint swelling and neck pain.  Skin: Negative for wound.  Neurological: Negative for weakness and numbness.   Physical Exam Updated Vital Signs BP 127/68 (BP Location: Right Arm)   Pulse 109   Temp 98.1 F (36.7 C) (Oral)   Resp 16   LMP 01/08/2016 (Exact Date)   SpO2 100%   Physical Exam  Constitutional: She appears well-developed and well-nourished.  HENT:  Head: Normocephalic.  Eyes: EOM are normal.  Neck: Normal range of motion.  Cardiovascular:  Pulses:      Radial pulses are 2+ on the right side, and 2+ on the left side.  Pulmonary/Chest: Effort normal.  Abdominal: She exhibits no distension.  Musculoskeletal: Normal range of motion.       Left elbow: She exhibits normal range of motion, no swelling and no effusion. Tenderness found.       Left wrist: Normal.       Left upper arm: She  exhibits tenderness (Distal triceps). She exhibits no bony tenderness and no swelling.       Left forearm: Normal.  Neurological: She is alert.  Patient with preserved distal sensation.  Psychiatric: She has a normal mood and affect.  Nursing note and vitals reviewed.  ED Treatments / Results   Procedures Procedures (including critical care time)  Medications Ordered in ED Medications - No data to display  DIAGNOSTIC STUDIES:  Oxygen Saturation is 100% on RA, normal by my interpretation.    COORDINATION OF CARE:  10:12 AM Discussed treatment plan with pt at bedside which includes Naproxen BID for 2 weeks and  pt agreed to plan. Pt has been advised to rest her arm and abstain from heavy lifting and repetitive movements. Follow-up with PCP at [facility].   Initial Impression / Assessment and Plan / ED Course  I have reviewed the triage vital signs and the nursing notes.  Pertinent labs & imaging results that were available during my care of the patient were reviewed by me and considered in my medical decision making (see chart for details).  Clinical Course    Vital signs reviewed and are as follows: Vitals:   02/04/16 0918  BP: 127/68  Pulse: 109  Resp: 16  Temp: 98.1 F (36.7 C)   Encouraged patient to return with worsening pain, swelling or color change, or other concerns.  I personally performed the services described in this documentation, which was scribed in my presence. The recorded information has been reviewed and is accurate.   Final Clinical Impressions(s) / ED Diagnoses   Final diagnoses:  Left arm pain   Patient with left elbow and upper arm pain. Upper extremity is neurovascularly intact. I suspect patient has an overuse injury. Will continue anti-inflammatories, PCP follow-up in 2 weeks if not improved. No swelling or significant risk factors or history for upper extremity DVT.  New Prescriptions New Prescriptions    NAPROXEN (NAPROSYN) 500 MG TABLET    Take 1 tablet (500 mg total) by mouth 2 (two) times daily.     Carlisle Cater, PA-C 02/04/16 [place], MD 02/09/16 1558

## 2016-02-04 NOTE — ED Triage Notes (Signed)
Pt reports she had left elbow pain, the pain has resolved in the elbow but is now in the bicep area. Pain has been present X3 weeks. Denies injury.

## 2016-02-26 ENCOUNTER — Encounter (HOSPITAL_COMMUNITY): Payer: Self-pay

## 2016-02-26 ENCOUNTER — Ambulatory Visit (HOSPITAL_COMMUNITY)
Admission: EM | Admit: 2016-02-26 | Discharge: 2016-02-26 | Disposition: A | Discharge status: Home / Self Care | Payer: Medicaid Other | Attending: Family Medicine | Admitting: Family Medicine

## 2016-02-26 DIAGNOSIS — M778 Other enthesopathies, not elsewhere classified: Secondary | ICD-10-CM | POA: Diagnosis not present

## 2016-02-26 MED ORDER — DICLOFENAC SODIUM 1 % TD GEL: 1.0000 "application " | Freq: Four times a day (QID) | TRANSDERMAL | 0 refills | Status: DC

## 2016-02-26 NOTE — ED Provider Notes (Signed)
CSN: [id number]     Arrival date & time 02/26/16  1656 History   First MD Initiated Contact with Patient 02/26/16 1858     Chief Complaint  Patient presents with  . Arm Pain   (Consider location/radiation/quality/duration/timing/severity/associated sxs/prior Treatment) 36 year old obese female complaining of left hand swelling for 7 weeks. She initially developed pain in the left upper arm and elbow almost a couple months ago for reasons unknown. She was seen in emergency department and diagnosed with most the skeletal pain and treated with an NSAID. She states now the original arm pain symptoms have abated and now she is concerned about swelling of the left hand. Denies any known injury. No fall or blunt trauma.      Past Medical History:  Diagnosis Date  . Acid reflux   . Allergy   . Anginal pain ([place])   . Anxiety   . Asthma   . Constipation   . Depression   . Dysrhythmia    Palpatations  . Environmental allergies   . Genital warts   . GERD (gastroesophageal reflux disease)   . H/O hiatal hernia   . Headache(784.0)   . Heart murmur   . MRSA cellulitis    Past Surgical History:  Procedure Laterality Date  . CERVICAL CONE BIOPSY    . CESAREAN SECTION  01/20/2012   Procedure: CESAREAN SECTION;  Surgeon: Melina Schools, MD;  Location: [facility];  Service: Obstetrics;  Laterality: N/A;  . COLONOSCOPY    . INSERTION OF MESH N/A 05/15/2015   Procedure: INSERTION OF MESH;  Surgeon: Ralene Ok, MD;  Location: [facility];  Service: General;  Laterality: N/A;  . MARSUPIALIZATION URETHRAL DIVERTICULUM    . UMBILICAL HERNIA REPAIR N/A 05/15/2015   Procedure: LAPAROSCOPIC UMBILICAL HERNIA;  Surgeon: Ralene Ok, MD;  Location: [facility];  Service: General;  Laterality: N/A;  . UPPER GI ENDOSCOPY     Family History  Problem Relation Age of Onset  . Diabetes Mother   . Hypertension Mother   . Depression Mother   . Depression Father   . Heart disease Maternal Grandmother   . Breast  cancer Maternal Aunt     [employer]  . Diabetes Maternal Grandfather   . Heart disease Maternal Grandfather   . Hypertension Maternal Grandfather   . Anesthesia problems Neg Hx    Social History  Substance Use Topics  . Smoking status: Never Smoker  . Smokeless tobacco: Never Used  . Alcohol use No   OB History    Gravida Para Term Preterm AB Living   2 1 1  0 1 1   SAB TAB Ectopic Multiple Live Births   1 0 0 0 1     Review of Systems  Constitutional: Negative for activity change, chills and fever.  HENT: Negative.   Respiratory: Negative.   Cardiovascular: Negative.   Musculoskeletal:       As per HPI  Skin: Negative for color change, pallor and rash.  Neurological: Negative.   All other systems reviewed and are negative.   Allergies  Doxycycline hyclate; Ketorolac tromethamine; Amoxicillin; and Doxycycline  Home Medications   Prior to Admission medications   Medication Sig Start Date End Date Taking? Authorizing Provider  albuterol (PROVENTIL HFA;VENTOLIN HFA) 108 (90 Base) MCG/ACT inhaler Inhale 2 puffs into the lungs every 4 (four) hours as needed for wheezing or shortness of breath. 06/06/15  Yes Merryl Hacker, MD  chlorpheniramine-HYDROcodone (TUSSIONEX PENNKINETIC ER) 10-8 MG/5ML SUER Take 5 mLs  by mouth every 12 (twelve) hours as needed for cough. 12/29/15  Yes Billy Fischer, MD  clindamycin (CLEOCIN) 300 MG capsule Take 1 capsule (300 mg total) by mouth 2 (two) times daily. 12/23/15  Yes Arnoldo Morale, MD  cyclobenzaprine (FLEXERIL) 10 MG tablet May use every 8 hours for headache 10/21/15  Yes Adam Telford Nab, DO  ipratropium (ATROVENT) 0.06 % nasal spray Place 2 sprays into both nostrils 4 (four) times daily. 12/29/15  Yes Billy Fischer, MD  methocarbamol (ROBAXIN) 500 MG tablet Take 1 tablet (500 mg total) by mouth every 8 (eight) hours as needed for muscle spasms. 12/15/15  Yes Josalyn Funches, MD  metroNIDAZOLE (FLAGYL) 500 MG tablet Take 1 tablet (500 mg total)  by mouth 3 (three) times daily. 12/23/15  Yes Arnoldo Morale, MD  naproxen (NAPROSYN) 500 MG tablet Take 1 tablet (500 mg total) by mouth 2 (two) times daily. 02/04/16  Yes Carlisle Cater, PA-C  omeprazole (PRILOSEC) 20 MG capsule Take 1 capsule (20 mg total) by mouth 2 (two) times daily before a meal. 12/23/15  Yes Arnoldo Morale, MD  rizatriptan (MAXALT) 10 MG tablet Take 1 tablet (10 mg total) by mouth as needed for migraine. May repeat in 2 hours if needed 12/15/15  Yes Adam Telford Nab, DO  diclofenac sodium (VOLTAREN) 1 % GEL Apply 1 application topically 4 (four) times daily. 02/26/16   Janne Napoleon, NP   Meds Ordered and Administered this Visit  Medications - No data to display  BP 135/81 (BP Location: Right Arm)   Pulse 82   Temp 98.4 F (36.9 C) (Oral)   Resp 20   LMP 02/03/2016 (Exact Date)   SpO2 100%  No data found.   Physical Exam  Constitutional: She is oriented to person, place, and time. She appears well-developed and well-nourished. No distress.  HENT:  Head: Normocephalic and atraumatic.  Eyes: EOM are normal. Pupils are equal, round, and reactive to light.  Neck: Neck supple.  Musculoskeletal: Normal range of motion. She exhibits edema and tenderness. She exhibits no deformity.  There is mild swelling to the left hand. No discoloration or deformity. Palpation over the back of the hand as well as the wrist and distal forearm produces tenderness. Flexion and extension also increases pain. There is direct tenderness over the individual tendons. Distal neurovascular and motor sensory is grossly intact. Radial pulse 2+. Normal warmth and color.  Lymphadenopathy:    She has no cervical adenopathy.  Neurological: She is alert and oriented to person, place, and time. No cranial nerve deficit.  Skin: Skin is warm and dry.  Psychiatric: She has a normal mood and affect.  Nursing note and vitals reviewed.   Urgent Care Course     Procedures (including critical care time)  Labs  Review Labs Reviewed - No data to display  Imaging Review No results found.   Visual Acuity Review  Right Eye Distance:   Left Eye Distance:   Bilateral Distance:    Right Eye Near:   Left Eye Near:    Bilateral Near:         MDM   1. Left wrist tendinitis    Wear the wrist splint most of the time for the next 7-10 days. You may remove it periodically to move the wrist and fingers around to keep him from getting stiff and weak. Apply ice to the wrist periodically especially for the next few days off and on. Apply the diclofenac gel to the hand  and wrist 4 times a day as this will help with swelling and inflammation of the tendons. If after 7-10 days you continue to have pain after removing the splint reapplied the splint. There is a telephone number on this page to call occupational health which can help you with physical therapy for your hand and wrist. Wrist splint Meds ordered this encounter  Medications  . diclofenac sodium (VOLTAREN) 1 % GEL    Sig: Apply 1 application topically 4 (four) times daily.    Dispense:  100 g    Refill:  0    Order Specific Question:   Supervising Provider    Answer:   Billy Fischer [[id number]]        Janne Napoleon, NP 02/26/16 [phone]

## 2016-02-26 NOTE — Discharge Instructions (Signed)
Wear the wrist splint most of the time for the next 7-10 days. You may remove it periodically to move the wrist and fingers around to keep him from getting stiff and weak. Apply ice to the wrist periodically especially for the next few days off and on. Apply the diclofenac gel to the hand and wrist 4 times a day as this will help with swelling and inflammation of the tendons. If after 7-10 days you continue to have pain after removing the splint reapplied the splint. There is a telephone number on this page to call occupational health which can help you with physical therapy for your hand and wrist.

## 2016-02-26 NOTE — ED Notes (Signed)
Patient was outside moved to room

## 2016-02-26 NOTE — ED Triage Notes (Signed)
Pt said she is having pain in her left arm radiating to her left hand. Has been present for 7 weeks. Was seen in the ER and they said it was a musculoskeletal issue. Have been taking naproxen.

## 2016-03-14 ENCOUNTER — Inpatient Hospital Stay (HOSPITAL_COMMUNITY): Payer: Medicaid Other

## 2016-03-14 ENCOUNTER — Inpatient Hospital Stay (HOSPITAL_COMMUNITY)
Admission: AD | Admit: 2016-03-14 | Discharge: 2016-03-14 | Disposition: A | Discharge status: Home / Self Care | Payer: Medicaid Other | Source: Ambulatory Visit | Attending: Obstetrics & Gynecology | Admitting: Obstetrics & Gynecology

## 2016-03-14 ENCOUNTER — Encounter: Payer: Self-pay | Admitting: Student

## 2016-03-14 DIAGNOSIS — Z880 Allergy status to penicillin: Secondary | ICD-10-CM | POA: Insufficient documentation

## 2016-03-14 DIAGNOSIS — O219 Vomiting of pregnancy, unspecified: Secondary | ICD-10-CM

## 2016-03-14 DIAGNOSIS — O26899 Other specified pregnancy related conditions, unspecified trimester: Secondary | ICD-10-CM

## 2016-03-14 DIAGNOSIS — Z3A01 Less than 8 weeks gestation of pregnancy: Secondary | ICD-10-CM | POA: Diagnosis not present

## 2016-03-14 DIAGNOSIS — O23591 Infection of other part of genital tract in pregnancy, first trimester: Secondary | ICD-10-CM | POA: Diagnosis not present

## 2016-03-14 DIAGNOSIS — R109 Unspecified abdominal pain: Secondary | ICD-10-CM | POA: Insufficient documentation

## 2016-03-14 DIAGNOSIS — O210 Mild hyperemesis gravidarum: Secondary | ICD-10-CM | POA: Insufficient documentation

## 2016-03-14 DIAGNOSIS — R112 Nausea with vomiting, unspecified: Secondary | ICD-10-CM | POA: Diagnosis not present

## 2016-03-14 DIAGNOSIS — N760 Acute vaginitis: Secondary | ICD-10-CM

## 2016-03-14 DIAGNOSIS — B9689 Other specified bacterial agents as the cause of diseases classified elsewhere: Secondary | ICD-10-CM

## 2016-03-14 DIAGNOSIS — O3680X0 Pregnancy with inconclusive fetal viability, not applicable or unspecified: Secondary | ICD-10-CM | POA: Diagnosis not present

## 2016-03-14 LAB — WET PREP, GENITAL
Sperm: NONE SEEN
Trich, Wet Prep: NONE SEEN
Yeast Wet Prep HPF POC: NONE SEEN

## 2016-03-14 LAB — URINALYSIS, ROUTINE W REFLEX MICROSCOPIC
Bilirubin Urine: NEGATIVE
GLUCOSE, UA: NEGATIVE mg/dL
Hgb urine dipstick: NEGATIVE
KETONES UR: NEGATIVE mg/dL
LEUKOCYTES UA: NEGATIVE
Nitrite: NEGATIVE
Protein, ur: NEGATIVE mg/dL
SPECIFIC GRAVITY, URINE: 1.012 (ref 1.005–1.030)
pH: 7 (ref 5.0–8.0)

## 2016-03-14 LAB — CBC
HEMATOCRIT: 36.4 % (ref 36.0–46.0)
HEMOGLOBIN: 12.3 g/dL (ref 12.0–15.0)
MCH: 26.5 pg (ref 26.0–34.0)
MCHC: 33.8 g/dL (ref 30.0–36.0)
MCV: 78.3 fL (ref 78.0–100.0)
PLATELETS: 228 10*3/uL (ref 150–400)
RBC: 4.65 MIL/uL (ref 3.87–5.11)
RDW: 15.6 % — ABNORMAL HIGH (ref 11.5–15.5)
WBC: 10 10*3/uL (ref 4.0–10.5)

## 2016-03-14 LAB — POCT PREGNANCY, URINE: Preg Test, Ur: POSITIVE — AB

## 2016-03-14 LAB — HCG, QUANTITATIVE, PREGNANCY: hCG, Beta Chain, Quant, S: 182 m[IU]/mL — ABNORMAL HIGH (ref <5)

## 2016-03-14 MED ORDER — PROMETHAZINE HCL 25 MG PO TABS: 25.0000 mg | ORAL_TABLET | Freq: Four times a day (QID) | ORAL | 0 refills | Status: DC | PRN

## 2016-03-14 MED ORDER — METRONIDAZOLE 0.75 % VA GEL: 1.0000 | Freq: Two times a day (BID) | VAGINAL | 0 refills | Status: DC

## 2016-03-14 NOTE — MAU Provider Note (Signed)
History     CSN: [id number]  Arrival date and time: 03/14/16 [id number]   First Provider Initiated Contact with Patient 03/14/16 2148      Chief Complaint  Patient presents with  . Pelvic Pain   HPI  Amanda [name] is a 36 y.o. G30P1011 female who presents with abdominal pain & nausea. Symptoms began 2 weeks ago. Reports intermittent lower abdominal/pelvic pain. Rates pain 6/10. Has been taking ibuprofen & muscle relaxer without relief. Nothing makes pain better or worse. Describes as cramp like pain.  Daily nausea x 2 weeks. Has vomited once, 3 days ago; no vomiting since. Some constipation that is "normal" for her. Last BM was today.  Denies fever, dysuria, vaginal bleeding, vaginal discharge, dyspareunia, or postcoital bleeding.  Has not taken pregnancy test at home.   OB History    Gravida Para Term Preterm AB Living   3 1 1  0 1 1   SAB TAB Ectopic Multiple Live Births   1 0 0 0 1      Past Medical History:  Diagnosis Date  . Acid reflux   . Allergy   . Anginal pain ([place])   . Anxiety   . Asthma   . Constipation   . Depression   . Dysrhythmia    Palpatations  . Environmental allergies   . Genital warts   . GERD (gastroesophageal reflux disease)   . H/O hiatal hernia   . Headache(784.0)   . Heart murmur   . MRSA cellulitis     Past Surgical History:  Procedure Laterality Date  . CERVICAL CONE BIOPSY    . CESAREAN SECTION  01/20/2012   Procedure: CESAREAN SECTION;  Surgeon: Melina Schools, MD;  Location: [facility];  Service: Obstetrics;  Laterality: N/A;  . COLONOSCOPY    . INSERTION OF MESH N/A 05/15/2015   Procedure: INSERTION OF MESH;  Surgeon: Ralene Ok, MD;  Location: [facility];  Service: General;  Laterality: N/A;  . MARSUPIALIZATION URETHRAL DIVERTICULUM    . UMBILICAL HERNIA REPAIR N/A 05/15/2015   Procedure: LAPAROSCOPIC UMBILICAL HERNIA;  Surgeon: Ralene Ok, MD;  Location: [facility];  Service: General;  Laterality: N/A;  . UPPER GI ENDOSCOPY       Family History  Problem Relation Age of Onset  . Diabetes Mother   . Hypertension Mother   . Depression Mother   . Depression Father   . Heart disease Maternal Grandmother   . Breast cancer Maternal Aunt     [employer]  . Diabetes Maternal Grandfather   . Heart disease Maternal Grandfather   . Hypertension Maternal Grandfather   . Anesthesia problems Neg Hx     Social History  Substance Use Topics  . Smoking status: Never Smoker  . Smokeless tobacco: Never Used  . Alcohol use No    Allergies:  Allergies  Allergen Reactions  . Doxycycline Hyclate Hives, Itching and Swelling    REACTION: swelling of face  . Ketorolac Tromethamine Other (See Comments)    Patient stated that it makes her extremely hyper  . Amoxicillin Swelling and Rash    Has patient had a PCN reaction causing immediate rash, facial/tongue/throat swelling, SOB or lightheadedness with hypotension: Unsure Has patient had a PCN reaction causing severe rash involving mucus membranes or skin necrosis: Unsure Has patient had a PCN reaction that required hospitalization No Has patient had a PCN reaction occurring within the last 10 years: No If all of the above answers are "NO", then may proceed  with Cephalosporin use.REACTION: facial swelling and rash.  . Doxycycline Rash    Prescriptions Prior to Admission  Medication Sig Dispense Refill Last Dose  . albuterol (PROVENTIL HFA;VENTOLIN HFA) 108 (90 Base) MCG/ACT inhaler Inhale 2 puffs into the lungs every 4 (four) hours as needed for wheezing or shortness of breath. 1 Inhaler 0 02/26/2016  . chlorpheniramine-HYDROcodone (TUSSIONEX PENNKINETIC ER) 10-8 MG/5ML SUER Take 5 mLs by mouth every 12 (twelve) hours as needed for cough. 120 mL 0 02/26/2016  . clindamycin (CLEOCIN) 300 MG capsule Take 1 capsule (300 mg total) by mouth 2 (two) times daily. 28 capsule 0 02/26/2016  . cyclobenzaprine (FLEXERIL) 10 MG tablet May use every 8 hours for headache 60 tablet 4 02/26/2016   . diclofenac sodium (VOLTAREN) 1 % GEL Apply 1 application topically 4 (four) times daily. 100 g 0   . ipratropium (ATROVENT) 0.06 % nasal spray Place 2 sprays into both nostrils 4 (four) times daily. 15 mL 1 02/26/2016  . methocarbamol (ROBAXIN) 500 MG tablet Take 1 tablet (500 mg total) by mouth every 8 (eight) hours as needed for muscle spasms. 60 tablet 0 02/26/2016  . metroNIDAZOLE (FLAGYL) 500 MG tablet Take 1 tablet (500 mg total) by mouth 3 (three) times daily. 28 tablet 0 02/26/2016  . naproxen (NAPROSYN) 500 MG tablet Take 1 tablet (500 mg total) by mouth 2 (two) times daily. 20 tablet 0 02/26/2016  . omeprazole (PRILOSEC) 20 MG capsule Take 1 capsule (20 mg total) by mouth 2 (two) times daily before a meal. 28 capsule 0 02/26/2016  . rizatriptan (MAXALT) 10 MG tablet Take 1 tablet (10 mg total) by mouth as needed for migraine. May repeat in 2 hours if needed 10 tablet 2 02/26/2016    Review of Systems  Constitutional: Negative.   Gastrointestinal: Positive for abdominal pain, constipation and nausea. Negative for abdominal distention, diarrhea and vomiting.  Genitourinary: Negative for dyspareunia, dysuria, vaginal bleeding and vaginal discharge.   Physical Exam   Blood pressure 127/77, pulse 77, temperature 98 F (36.7 C), temperature source Oral, resp. rate 19, height 5\' 5"  (1.651 m), weight 275 lb (124.7 kg), last menstrual period 02/02/2016.  Physical Exam  Nursing note and vitals reviewed. Constitutional: She is oriented to person, place, and time. She appears well-developed and well-nourished. No distress.  HENT:  Head: Normocephalic and atraumatic.  Eyes: Conjunctivae are normal. Right eye exhibits no discharge. Left eye exhibits no discharge. No scleral icterus.  Neck: Normal range of motion.  Cardiovascular: Normal rate, regular rhythm and normal heart sounds.   No murmur heard. Respiratory: Effort normal and breath sounds normal. No respiratory distress. She has no wheezes.   GI: Soft. Bowel sounds are normal. She exhibits no distension. There is no tenderness. There is no rebound and no guarding.  Genitourinary: Uterus is tender. Cervix exhibits no motion tenderness. Right adnexum displays no mass and no tenderness. Left adnexum displays no mass and no tenderness.  Genitourinary Comments: Cervix closed  Neurological: She is alert and oriented to person, place, and time.  Skin: Skin is warm and dry. She is not diaphoretic.  Psychiatric: She has a normal mood and affect. Her behavior is normal. Judgment and thought content normal.    MAU Course  Procedures Results for orders placed or performed during the hospital encounter of 03/14/16 (from the past 24 hour(s))  Urinalysis, Routine w reflex microscopic     Status: None   Collection Time: 03/14/16  7:25 PM  Result Value Ref Range  Color, Urine YELLOW YELLOW   APPearance CLEAR CLEAR   Specific Gravity, Urine 1.012 1.005 - 1.030   pH 7.0 5.0 - 8.0   Glucose, UA NEGATIVE NEGATIVE mg/dL   Hgb urine dipstick NEGATIVE NEGATIVE   Bilirubin Urine NEGATIVE NEGATIVE   Ketones, ur NEGATIVE NEGATIVE mg/dL   Protein, ur NEGATIVE NEGATIVE mg/dL   Nitrite NEGATIVE NEGATIVE   Leukocytes, UA NEGATIVE NEGATIVE  Pregnancy, urine POC     Status: Abnormal   Collection Time: 03/14/16  7:47 PM  Result Value Ref Range   Preg Test, Ur POSITIVE (A) NEGATIVE  CBC     Status: Abnormal   Collection Time: 03/14/16  8:50 PM  Result Value Ref Range   WBC 10.0 4.0 - 10.5 K/uL   RBC 4.65 3.87 - 5.11 MIL/uL   Hemoglobin 12.3 12.0 - 15.0 g/dL   HCT 36.4 36.0 - 46.0 %   MCV 78.3 78.0 - 100.0 fL   MCH 26.5 26.0 - 34.0 pg   MCHC 33.8 30.0 - 36.0 g/dL   RDW 15.6 (H) 11.5 - 15.5 %   Platelets 228 150 - 400 K/uL  hCG, quantitative, pregnancy     Status: Abnormal   Collection Time: 03/14/16  8:50 PM  Result Value Ref Range   hCG, Beta Chain, Quant, S 182 (H) <5 mIU/mL  Wet prep, genital     Status: Abnormal   Collection Time:  03/14/16  8:59 PM  Result Value Ref Range   Yeast Wet Prep HPF POC NONE SEEN NONE SEEN   Trich, Wet Prep NONE SEEN NONE SEEN   Clue Cells Wet Prep HPF POC PRESENT (A) NONE SEEN   WBC, Wet Prep HPF POC FEW (A) NONE SEEN   Sperm NONE SEEN    US Ob Comp Less 14 Wks  Result Date: 03/14/2016 CLINICAL DATA:  36 year old female presenting with pelvic pain. HCG levels pending. LMP: 11/08/2016 corresponding to an estimated gestational age of [redacted] weeks, 6 days. EXAM: OBSTETRIC <14 WK [place] AND TRANSVAGINAL OB US TECHNIQUE: Both transabdominal and transvaginal ultrasound examinations were performed for complete evaluation of the gestation as well as the maternal uterus, adnexal regions, and pelvic cul-de-sac. Transvaginal technique was performed to assess early pregnancy. COMPARISON:  None. FINDINGS: Evaluation is limited due to patient's body habitus. The uterus is anteverted and appears unremarkable. The endometrium measures approximately 6 mm in thickness. No intrauterine pregnancy identified. The possibilities include a pregnancy, too early to see with ultrasound, a recent spontaneous abortion, or an ectopic pregnancy. Correlation with clinical exam and follow-up with serial HCG levels and ultrasound recommended. The right ovary is not visualized. The left ovary appears unremarkable and measures 2.9 x 2.1 x 2.5 cm. No free fluid noted within pelvis. IMPRESSION: 1. No intrauterine pregnancy identified. Findings may represent an early pregnancy, recent spontaneous abortion, or an ectopic pregnancy. Correlation with clinical exam and follow-up with serial HCG levels recommended. 2. Unremarkable left ovary.  The right ovary is not visualized. Electronically Signed   By: Anner Crete M.D.   On: 03/14/2016 21:40   US Ob Transvaginal  Result Date: 03/14/2016 CLINICAL DATA:  36 year old female presenting with pelvic pain. HCG levels pending. LMP: 11/08/2016 corresponding to an estimated gestational age of [redacted] weeks, 6  days. EXAM: OBSTETRIC <14 WK [place] AND TRANSVAGINAL OB US TECHNIQUE: Both transabdominal and transvaginal ultrasound examinations were performed for complete evaluation of the gestation as well as the maternal uterus, adnexal regions, and pelvic cul-de-sac. Transvaginal technique was performed to  assess early pregnancy. COMPARISON:  None. FINDINGS: Evaluation is limited due to patient's body habitus. The uterus is anteverted and appears unremarkable. The endometrium measures approximately 6 mm in thickness. No intrauterine pregnancy identified. The possibilities include a pregnancy, too early to see with ultrasound, a recent spontaneous abortion, or an ectopic pregnancy. Correlation with clinical exam and follow-up with serial HCG levels and ultrasound recommended. The right ovary is not visualized. The left ovary appears unremarkable and measures 2.9 x 2.1 x 2.5 cm. No free fluid noted within pelvis. IMPRESSION: 1. No intrauterine pregnancy identified. Findings may represent an early pregnancy, recent spontaneous abortion, or an ectopic pregnancy. Correlation with clinical exam and follow-up with serial HCG levels recommended. 2. Unremarkable left ovary.  The right ovary is not visualized. Electronically Signed   By: Anner Crete M.D.   On: 03/14/2016 21:40     MDM +UPT UA, wet prep, GC/chlamydia, CBC, ABO/Rh, quant hCG, HIV, and [place] today to rule out ectopic pregnancy O positive BHCG 182 Ultrasound shows no IUP or adnexal mass This abdominal pain could represent a normal pregnancy, spontaneous abortion, or even an ectopic pregnancy which can be life-threatening. Cultures were obtained to rule out pelvic infection.  Assessment and Plan  A:  1. Pregnancy of unknown anatomic location   2. Abdominal pain affecting pregnancy   3. BV (bacterial vaginosis)   4. Nausea and vomiting during pregnancy    P: Discharge home Rx flagyl & phenergan Go to [facility] Thursday morning for BHCG GC/CT  pending Discussed reasons to return to [place] 03/14/2016, 8:43 PM

## 2016-03-14 NOTE — Discharge Instructions (Signed)
Abdominal Pain During Pregnancy Abdominal pain is common in pregnancy. Most of the time, it does not cause harm. There are many causes of abdominal pain. Some causes are more serious than others and sometimes the cause is not known. Abdominal pain can be a sign that something is very wrong with the pregnancy or the pain may have nothing to do with the pregnancy. Always tell your health care provider if you have any abdominal pain. Follow these instructions at home:  Do not have sex or put anything in your vagina until your symptoms go away completely.  Watch your abdominal pain for any changes.  Get plenty of rest until your pain improves.  Drink enough fluid to keep your urine clear or pale yellow.  Take over-the-counter or prescription medicines only as told by your health care provider.  Keep all follow-up visits as told by your health care provider. This is important. Contact a health care provider if:  You have a fever.  Your pain gets worse or you have cramping.  Your pain continues after resting. Get help right away if:  You are bleeding, leaking fluid, or passing tissue from the vagina.  You have vomiting or diarrhea that does not go away.  You have painful or bloody urination.  You notice a decrease in your baby's movements.  You feel very weak or faint.  You have shortness of breath.  You develop a severe headache with abdominal pain.  You have abnormal vaginal discharge with abdominal pain. This information is not intended to replace advice given to you by your health care provider. Make sure you discuss any questions you have with your health care provider. Document Released: 01/10/2005 Document Revised: 10/22/2015 Document Reviewed: 08/09/2012 Elsevier Interactive Patient Education  2017 Else[employer] Inc.     Bacterial Vaginosis Bacterial vaginosis is an infection of the vagina. It happens when too many germs (bacteria) grow in the vagina. This infection puts  you at risk for infections from sex (STIs). Treating this infection can lower your risk for some STIs. You should also treat this if you are pregnant. It can cause your baby to be born early. Follow these instructions at home: Medicines  Take over-the-counter and prescription medicines only as told by your doctor.  Take or use your antibiotic medicine as told by your doctor. Do not stop taking or using it even if you start to feel better. General instructions  If you your sexual partner is a woman, tell her that you have this infection. She needs to get treatment if she has symptoms. If you have a female partner, he does not need to be treated.  During treatment:  Avoid sex.  Do not douche.  Avoid alcohol as told.  Avoid breastfeeding as told.  Drink enough fluid to keep your pee (urine) clear or pale yellow.  Keep your vagina and butt (rectum) clean.  Wash the area with warm water every day.  Wipe from front to back after you use the toilet.  Keep all follow-up visits as told by your doctor. This is important. Preventing this condition  Do not douche.  Use only warm water to wash around your vagina.  Use protection when you have sex. This includes:  Latex condoms.  Dental dams.  Limit how many people you have sex with. It is best to only have sex with the same person (be monogamous).  Get tested for STIs. Have your partner get tested.  Wear underwear that is cotton or lined with  cotton.  Avoid tight pants and pantyhose. This is most important in summer.  Do not use any products that have nicotine or tobacco in them. These include cigarettes and e-cigarettes. If you need help quitting, ask your doctor.  Do not use illegal drugs.  Limit how much alcohol you drink. Contact a doctor if:  Your symptoms do not get better, even after you are treated.  You have more discharge or pain when you pee (urinate).  You have a fever.  You have pain in your belly  (abdomen).  You have pain with sex.  Your bleed from your vagina between periods. Summary  This infection happens when too many germs (bacteria) grow in the vagina.  Treating this condition can lower your risk for some infections from sex (STIs).  You should also treat this if you are pregnant. It can cause early (premature) birth.  Do not stop taking or using your antibiotic medicine even if you start to feel better. This information is not intended to replace advice given to you by your health care provider. Make sure you discuss any questions you have with your health care provider. Document Released: 10/20/2007 Document Revised: 09/26/2015 Document Reviewed: 09/26/2015 Elsevier Interactive Patient Education  2017 [employer].

## 2016-03-14 NOTE — MAU Note (Signed)
Pt presents complaining of pelvic pressure and pain and pain in her stomach as well as nausea for 2 weeks. LMP 02/02/16. Took a pregnancy test at home that was negative at home. Denies vaginal bleeding or discharge.

## 2016-03-15 LAB — GC/CHLAMYDIA PROBE AMP (~~LOC~~) NOT AT ARMC
Chlamydia: NEGATIVE
NEISSERIA GONORRHEA: NEGATIVE

## 2016-03-15 LAB — HIV ANTIBODY (ROUTINE TESTING W REFLEX): HIV SCREEN 4TH GENERATION: NONREACTIVE

## 2016-03-17 ENCOUNTER — Telehealth: Payer: Self-pay

## 2016-03-17 ENCOUNTER — Ambulatory Visit: Payer: Medicaid Other

## 2016-03-17 NOTE — Telephone Encounter (Signed)
Called patient regarding miss appointment this am. Patient stated she is not planning on keeping this baby and will not need any appointments or prenatal care.

## 2016-04-12 ENCOUNTER — Other Ambulatory Visit: Payer: Self-pay | Admitting: Family Medicine

## 2016-05-09 ENCOUNTER — Encounter: Payer: Medicaid Other | Attending: Family Medicine | Admitting: Registered"

## 2016-05-09 ENCOUNTER — Encounter: Payer: Self-pay | Admitting: Registered"

## 2016-05-09 DIAGNOSIS — Z713 Dietary counseling and surveillance: Secondary | ICD-10-CM | POA: Insufficient documentation

## 2016-05-09 NOTE — Progress Notes (Signed)
Medical Nutrition Therapy:  Appt start time: 0815 end time:  0900.  Assessment:  Primary concerns today: Pt states her doctor said she needed to see a nutritionist because she is obese. Pt states before having her daughter 4 years ago, she weighed 30-40 lbs less. Pt states she has stopped exercising and doesn't eat enough. Pt states she feels sluggish, had pain, and low energy. Pt states she has tried [employer] green drink weight loss strategy but couldn't do it because she felt too hungry.  Pt states she lives with her 74 yr old daughter and her mom lives nearby and Pt stays very busy taking care of her child, work and helping other people out. Pt reports she does not have a strong social support system.   Pt states she is not pregnant. Pt states she took medicine for h pylori, but thinks she still has it because pharmacist had several medications but is not taking them because she was not educated on the purpose of the medications.  MEDICATIONS: reviewed  DIETARY INTAKE:  Food avoided: eggs, cabbage, brussels, strawberries. Pt states she likes most vegetables especially spinach raw and cooked.  24-hr recall:  B ( AM): maybe water  Snk ( AM) :none  L (2 PM): on way to work gets fast food: fried chicken, sweet tea or water or juice Snk ( PM): none OR crackers OR PB sandwich D (10-11 pm PM): none OR chips, rice, chicken OR hamburger, gravy, rice, vegetable Snk ( PM): none Beverages: water, sweet tea, juice  Recent physical activity: ADL  Estimated energy needs: 1800 calories 200 g carbohydrates 135 g protein 50 g fat  Progress Towards Goal(s):  No progress.   Nutritional Diagnosis:  Gumlog-3.3 Overweight/obesity As related to disportionate meal distributions and inactivity.  As evidenced by diet recal and pt reports lack of activity.    Intervention:  Nutrition Education. Importance of eating balanced meals throughout the day. Setting realistic goals for change. Reason to avoid  extreme dieting.  Goals: Walk for 30 min in the morning, 15 min to start is fine too Try to get in breakfast, smoothie Consider having smaller amounts of sweet tea Remember to spend time taking care of your needs  Handouts given during visit include:   My Plate Planner   Counselor list  Barriers to learning/adherence to lifestyle change: none  Demonstrated degree of understanding via:  Teach Back   Monitoring/Evaluation:  Dietary intake, exercise, social support, and body weight prn.

## 2016-05-09 NOTE — Patient Instructions (Addendum)
Walk for 30 min in the morning, 15 min to start is fine too Try to get in breakfast, smoothie Consider having smaller amounts of sweet tea Remember to spend time taking care of your needs

## 2016-05-28 IMAGING — CT CT ABDOMEN W/O CM
2 of 4 series · 15 of 46 positions shown, 17 images · non-contrast
Comparison: None.

CLINICAL DATA: Palpable abdominal mass.  Possible hernia.

EXAM:
CT ABDOMEN WITHOUT CONTRAST
TECHNIQUE: Multidetector CT imaging of the abdomen was performed following the
standard protocol without IV contrast.

[Series 2: abd/ pelvis 5.0 i30f 1 · axial · 0.72mm/px · z∈[+772,+1027]mm · 12 of 57 slices shown, 14 images]
[im 3/57  soft-tissue]
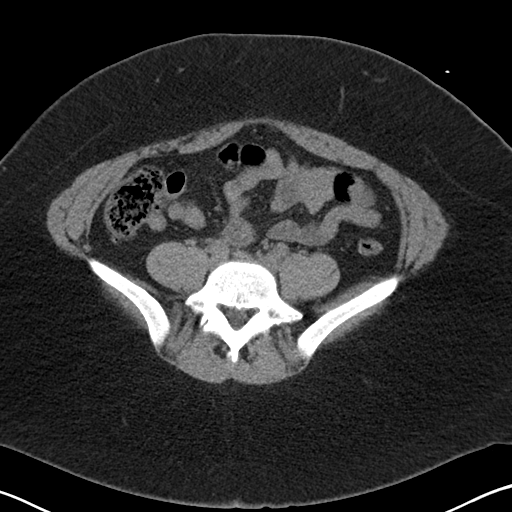
[im 3/57  bone]
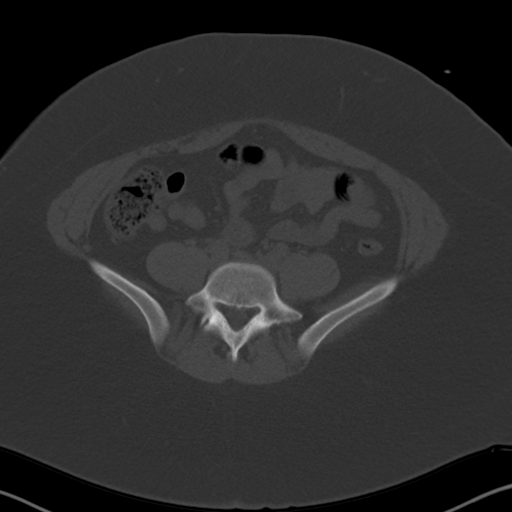
[im 9/57  soft-tissue]
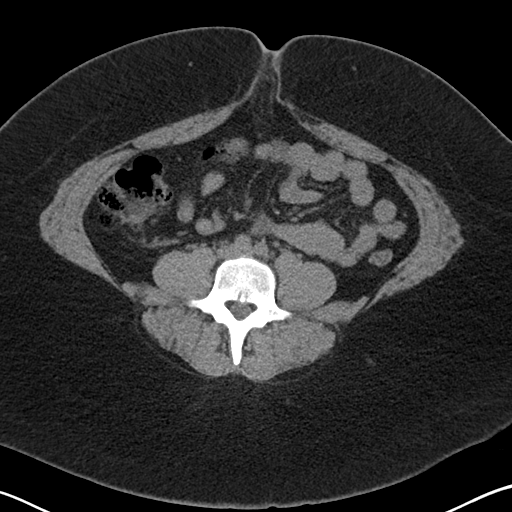
[im 14/57  soft-tissue]
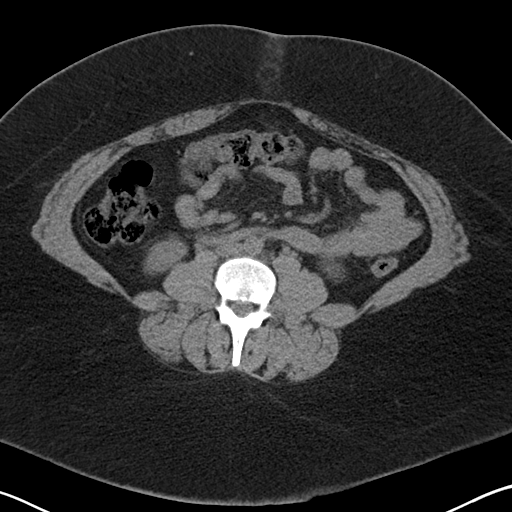
[im 17/57  soft-tissue]
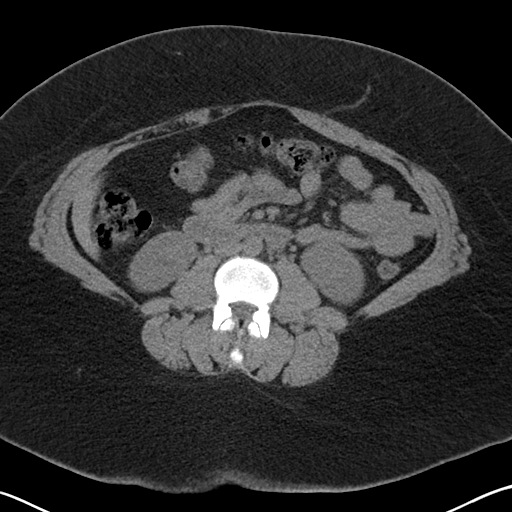
[im 22/57  soft-tissue]
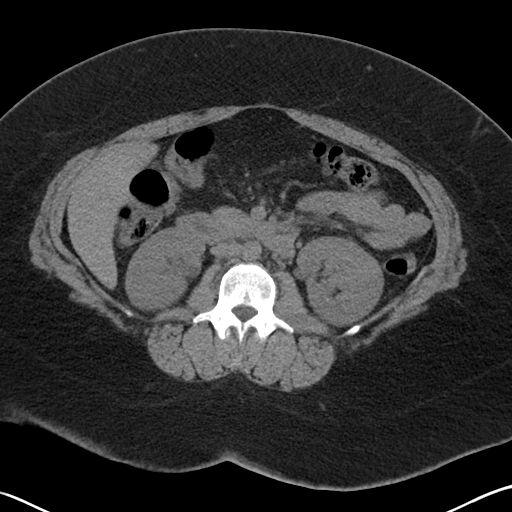
[im 27/57  soft-tissue]
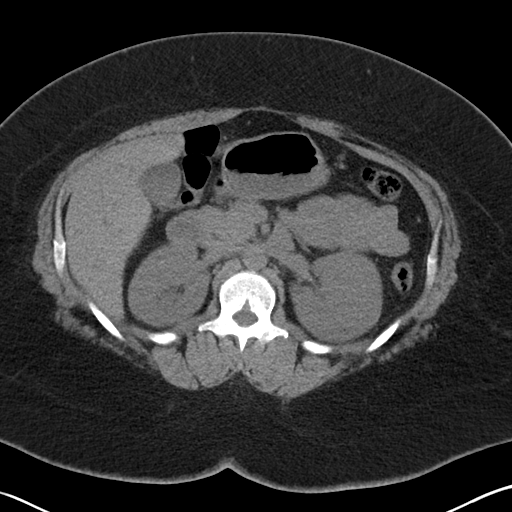
[im 30/57  soft-tissue]
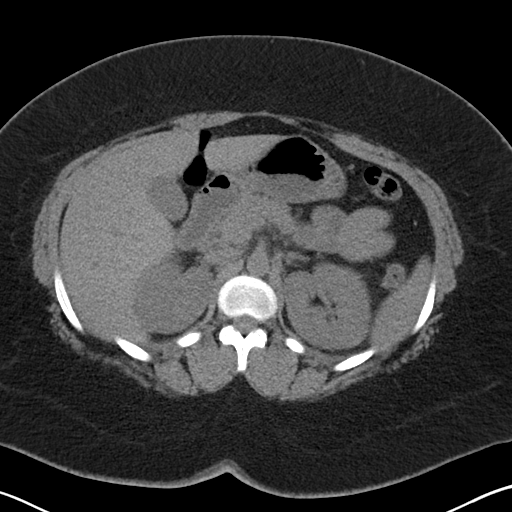
[im 35/57  soft-tissue]
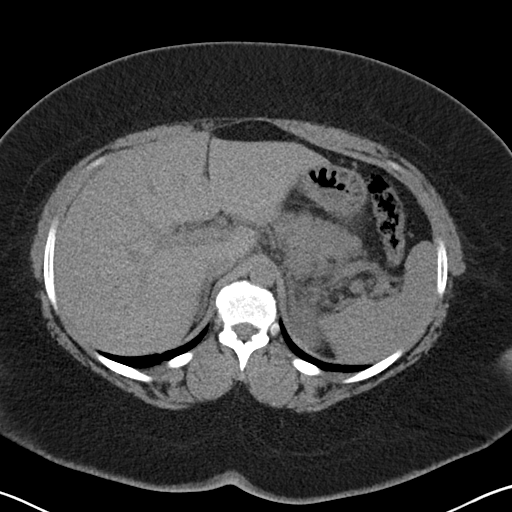
[im 41/57  soft-tissue]
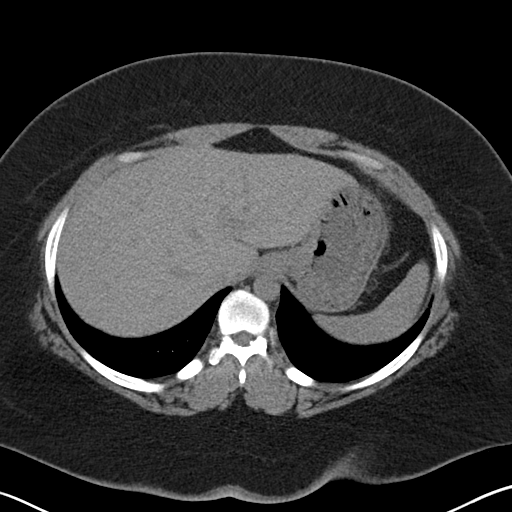
[im 41/57  bone]
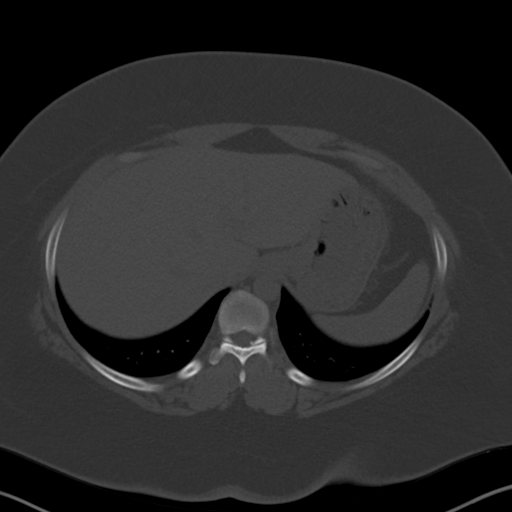
[im 43/57  soft-tissue]
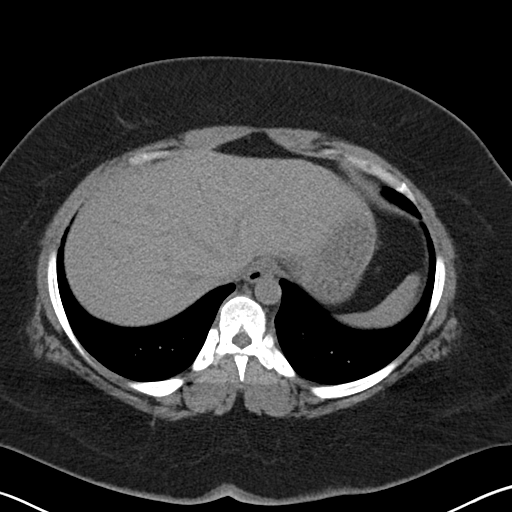
[im 49/57  soft-tissue]
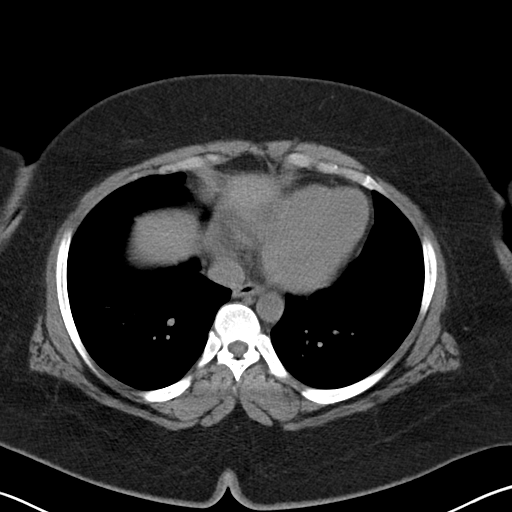
[im 54/57  soft-tissue]
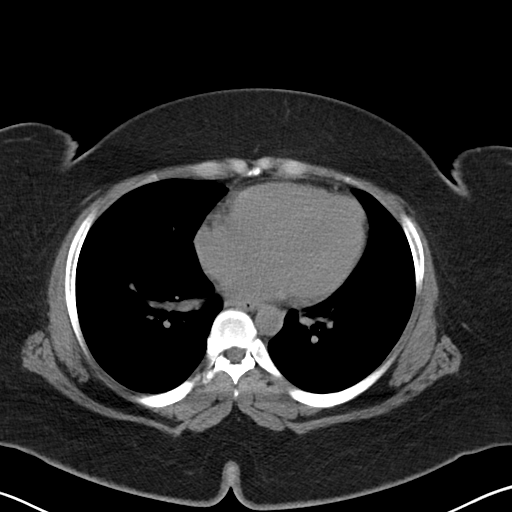

[Series 5: cor st · coronal · 0.55mm/px · 3 of 112 slices shown]
[im 38/112  soft-tissue]
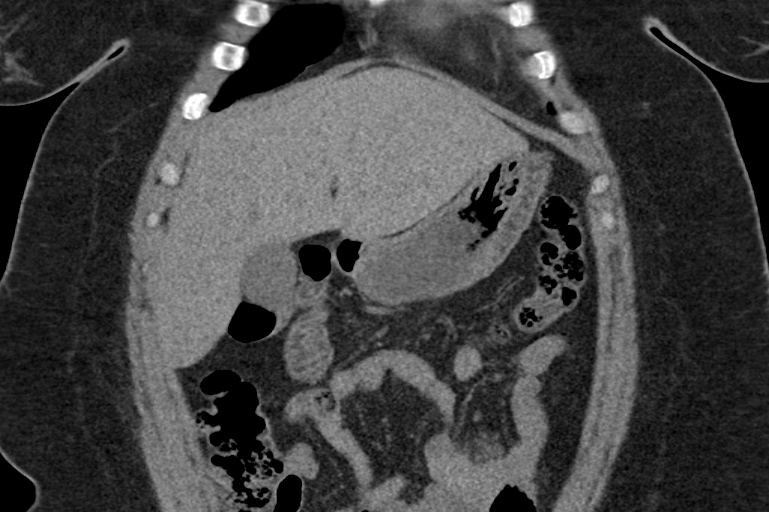
[im 50/112  soft-tissue]
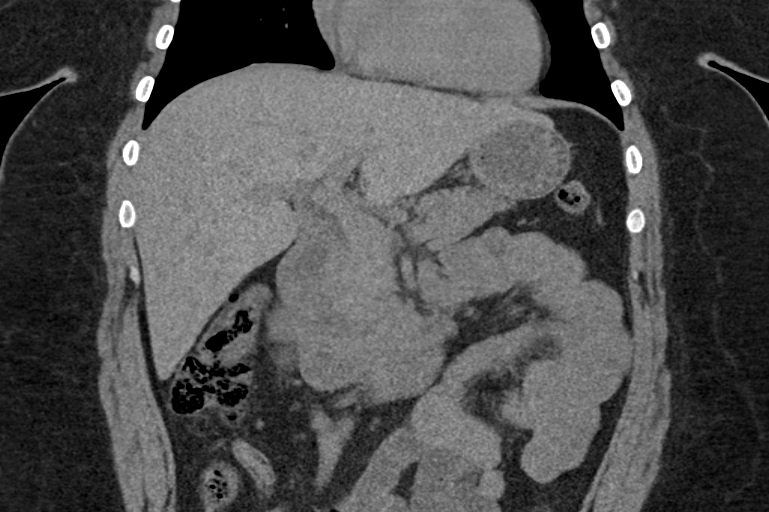
[im 62/112  soft-tissue]
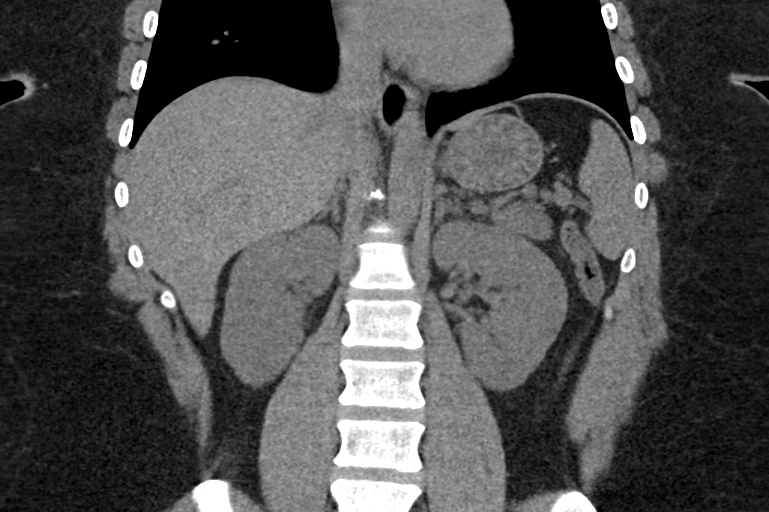

[15 of 46 positions shown; findings below may reference images not displayed]

FINDINGS: Lower chest: The lung bases are clear of acute process. No pleural
effusion or pulmonary lesions. The heart is normal in size. No
pericardial effusion. The distal esophagus and aorta are
unremarkable.

Hepatobiliary: No focal hepatic lesions or intrahepatic biliary
dilatation. The gallbladder is normal. No common bile duct
dilatation.

Pancreas: No mass, inflammation or ductal dilatation

Spleen: Normal size.  No focal lesions.

Adrenals/Urinary Tract: The adrenal glands are unremarkable. No
renal or upper ureteral calculi. No hydronephrosis. No renal mass.

Stomach/Bowel: The stomach, duodenum, visualized small bowel and
visualized colon are unremarkable. No inflammatory changes, mass
lesions or obstructive findings. The terminal ileum is normal. The
appendix is normal.

Vascular/Lymphatic: No mesenteric or retroperitoneal mass or
adenopathy. The aorta is normal in caliber. No atherosclerotic
calcifications.

Other: There is a deep periumbilical abdominal wall hernia. This
contains fat. No findings for incarceration.

Musculoskeletal: No significant bony findings.
IMPRESSION: 1. Small periumbilical abdominal wall hernia containing fat. No
findings for incarceration.
2. No acute abdominal findings are identified without IV or oral
contrast.

## 2016-06-05 ENCOUNTER — Emergency Department (HOSPITAL_COMMUNITY): Payer: Medicaid Other

## 2016-06-05 ENCOUNTER — Emergency Department (HOSPITAL_COMMUNITY)
Admission: EM | Admit: 2016-06-05 | Discharge: 2016-06-05 | Disposition: A | Discharge status: Home / Self Care | Payer: Medicaid Other | Attending: Emergency Medicine | Admitting: Emergency Medicine

## 2016-06-05 ENCOUNTER — Encounter (HOSPITAL_COMMUNITY): Payer: Self-pay | Admitting: Emergency Medicine

## 2016-06-05 DIAGNOSIS — Z79899 Other long term (current) drug therapy: Secondary | ICD-10-CM | POA: Insufficient documentation

## 2016-06-05 DIAGNOSIS — Z9109 Other allergy status, other than to drugs and biological substances: Secondary | ICD-10-CM

## 2016-06-05 DIAGNOSIS — R0789 Other chest pain: Secondary | ICD-10-CM

## 2016-06-05 DIAGNOSIS — F410 Panic disorder [episodic paroxysmal anxiety] without agoraphobia: Secondary | ICD-10-CM | POA: Diagnosis not present

## 2016-06-05 DIAGNOSIS — J45909 Unspecified asthma, uncomplicated: Secondary | ICD-10-CM | POA: Insufficient documentation

## 2016-06-05 LAB — BASIC METABOLIC PANEL
Anion gap: 7 (ref 5–15)
BUN: 9 mg/dL (ref 6–20)
CHLORIDE: 106 mmol/L (ref 101–111)
CO2: 24 mmol/L (ref 22–32)
CREATININE: 0.72 mg/dL (ref 0.44–1.00)
Calcium: 9.1 mg/dL (ref 8.9–10.3)
GFR calc non Af Amer: >60 mL/min (ref >60)
GLUCOSE: 88 mg/dL (ref 65–99)
Potassium: 4 mmol/L (ref 3.5–5.1)
Sodium: 137 mmol/L (ref 135–145)

## 2016-06-05 LAB — CBC
HCT: 37.3 % (ref 36.0–46.0)
Hemoglobin: 12.3 g/dL (ref 12.0–15.0)
MCH: 26 pg (ref 26.0–34.0)
MCHC: 33 g/dL (ref 30.0–36.0)
MCV: 78.9 fL (ref 78.0–100.0)
PLATELETS: 127 10*3/uL — AB (ref 150–400)
RBC: 4.73 MIL/uL (ref 3.87–5.11)
RDW: 15.3 % (ref 11.5–15.5)
WBC: 12.1 10*3/uL — ABNORMAL HIGH (ref 4.0–10.5)

## 2016-06-05 LAB — I-STAT TROPONIN, ED: Troponin i, poc: 0 ng/mL (ref 0.00–0.08)

## 2016-06-05 MED ORDER — LORAZEPAM 1 MG PO TABS
2.0000 mg | ORAL_TABLET | Freq: Once | ORAL | Status: AC
  Administered 2016-06-05: 2 mg via ORAL
  Filled 2016-06-05: qty 2

## 2016-06-05 MED ORDER — LORATADINE 10 MG PO TABS: 10.0000 mg | ORAL_TABLET | Freq: Every day | ORAL | 0 refills | Status: DC

## 2016-06-05 MED ORDER — LORAZEPAM 1 MG PO TABS: 1.0000 mg | ORAL_TABLET | Freq: Three times a day (TID) | ORAL | 0 refills | Status: DC | PRN

## 2016-06-05 NOTE — Discharge Instructions (Signed)
Please read attached information. If you experience any new or worsening signs or symptoms please return to the emergency room for evaluation. Please follow-up with your primary care provider or specialist as discussed. Please use medication prescribed only as directed and discontinue taking if you have any concerning signs or symptoms.   °

## 2016-06-05 NOTE — ED Triage Notes (Signed)
Pt c/o left sided chest pain x 1 week. Pt reports her apartment was recently painted and now she is having coughing, headache, watery eyes and anxiety attacks.

## 2016-06-05 NOTE — ED Notes (Signed)
Pt states the 2 year anniversary of multiple family members passing is coming up and she feels overwhelmed. Pt crying in room.

## 2016-06-05 NOTE — ED Provider Notes (Signed)
[facility] DEPT Provider Note   CSN: [id number] Arrival date & time: 06/05/16  1105    History   Chief Complaint Chief Complaint  Patient presents with  . Cough  . Headache  . Chest Pain  . Anxiety    HPI Amanda [name] is a 36 y.o. female.  HPI  36 year old female presents today with numerous complaints.  Patient notes over the last several weeks she has been under significant stress.  She reports that they are renovating her apartment and is having to live in the renovation.  Proximally 1.5 weeks ago she started having pain under her left breast and left anterior chest.  She notes this feels like tightness and similar to previous panic attacks.  She notes it comes and goes lasts for approximately hour and then resolved without any lingering symptoms.  She denies any associated shortness of breath, but does feel significant anxiety with this.  She notes last night she developed runny nose, itchy watery eyes, and nonproductive cough.  She reports a history of allergies and started taking Benadryl last night that did not improve her symptoms.  Patient denies any history of DVT or PE, she denies any significant risk factors.  She denies any cardiac history, non-smoker, no hypertension, no hyperlipidemia or any significant family history.  Patient notes previous episode of the same in the past that was managed with antianxiety medicine.   Past Medical History:  Diagnosis Date  . Anginal pain ([place])   . Anxiety   . Asthma   . Constipation   . Depression   . Dysrhythmia    Palpatations  . Genital warts   . GERD (gastroesophageal reflux disease)   . H/O hiatal hernia   . Headache(784.0)   . Heart murmur   . MRSA cellulitis     Patient Active Problem List   Diagnosis Date Noted  . Low back pain 12/16/2015  . H. pylori infection 11/02/2015  . Intractable chronic migraine without aura and without status migrainosus 03/19/2015  . Morbid obesity ([place]) 03/19/2015  .  Murmur 06/20/2012  . INSOMNIA 08/14/2009  . DYSMENORRHEA 12/09/2008  . FATIGUE 03/25/2008  . DEPRESSION/ANXIETY 09/11/2007  . HEADACHE 09/11/2007  . PALPITATIONS 02/23/2007  . CHEST PAIN 02/23/2007  . ALLERGIC RHINITIS 10/06/2006  . ASTHMA 10/06/2006  . GERD 10/06/2006    Past Surgical History:  Procedure Laterality Date  . CERVICAL CONE BIOPSY    . CESAREAN SECTION  01/20/2012   Procedure: CESAREAN SECTION;  Surgeon: Melina Schools, MD;  Location: [facility];  Service: Obstetrics;  Laterality: N/A;  . COLONOSCOPY    . HERNIA REPAIR    . INSERTION OF MESH N/A 05/15/2015   Procedure: INSERTION OF MESH;  Surgeon: Ralene Ok, MD;  Location: [facility];  Service: General;  Laterality: N/A;  . MARSUPIALIZATION URETHRAL DIVERTICULUM    . UMBILICAL HERNIA REPAIR N/A 05/15/2015   Procedure: LAPAROSCOPIC UMBILICAL HERNIA;  Surgeon: Ralene Ok, MD;  Location: [facility];  Service: General;  Laterality: N/A;  . UPPER GI ENDOSCOPY      OB History    Gravida Para Term Preterm AB Living   3 1 1  0 1 1   SAB TAB Ectopic Multiple Live Births   1 0 0 0 1       Home Medications    Prior to Admission medications   Medication Sig Start Date End Date Taking? Authorizing Provider  albuterol (PROVENTIL HFA;VENTOLIN HFA) 108 (90 Base) MCG/ACT inhaler Inhale 2 puffs into  the lungs every 4 (four) hours as needed for wheezing or shortness of breath. Patient not taking: Reported on 06/05/2016 06/06/15   Horton, Barbette Hair, MD  cyclobenzaprine (FLEXERIL) 10 MG tablet May use every 8 hours for headache Patient taking differently: Take 10 mg by mouth. May use every 8 hours for headache 10/21/15   Pieter Partridge, DO  loratadine (CLARITIN) 10 MG tablet Take 1 tablet (10 mg total) by mouth daily. 06/05/16   Kloey Cazarez, Dellis Filbert, PA-C  LORazepam (ATIVAN) 1 MG tablet Take 1 tablet (1 mg total) by mouth 3 (three) times daily as needed for anxiety. 06/05/16   Allen Basista, Dellis Filbert, PA-C  metroNIDAZOLE (METROGEL VAGINAL) 0.75 %  vaginal gel Place 1 Applicatorful vaginally 2 (two) times daily. Patient not taking: Reported on 05/09/2016 03/14/16   Jorje Guild, NP  omeprazole (PRILOSEC) 20 MG capsule Take 1 capsule (20 mg total) by mouth 2 (two) times daily before a meal. 12/23/15   Arnoldo Morale, MD  promethazine (PHENERGAN) 25 MG tablet Take 1 tablet (25 mg total) by mouth every 6 (six) hours as needed for nausea or vomiting. Patient not taking: Reported on 06/05/2016 03/14/16   Jorje Guild, NP    Family History Family History  Problem Relation Age of Onset  . Diabetes Mother   . Hypertension Mother   . Depression Mother   . Depression Father   . Heart disease Maternal Grandmother   . Breast cancer Maternal Aunt        [employer]  . Diabetes Maternal Grandfather   . Heart disease Maternal Grandfather   . Hypertension Maternal Grandfather   . Anesthesia problems Neg Hx     Social History Social History  Substance Use Topics  . Smoking status: Never Smoker  . Smokeless tobacco: Never Used  . Alcohol use No     Allergies   Doxycycline hyclate; Ketorolac tromethamine; Amoxicillin; and Doxycycline   Review of Systems Review of Systems  All other systems reviewed and are negative.    Physical Exam Updated Vital Signs BP (!) 154/95   Pulse 82   Temp 99 F (37.2 C) (Oral)   Resp (!) 23   Ht 5\' 2"  (1.575 m)   Wt 117.9 kg   LMP 05/23/2016   SpO2 100%   Breastfeeding? No   BMI 47.55 kg/m   Physical Exam  Constitutional: She is oriented to person, place, and time. She appears well-developed and well-nourished.  HENT:  Head: Normocephalic and atraumatic.  Eyes: Conjunctivae are normal. Pupils are equal, round, and reactive to light. Right eye exhibits no discharge. Left eye exhibits no discharge. No scleral icterus.  Watery eyes, rhinorrhea   Neck: Normal range of motion. No JVD present. No tracheal deviation present.  Cardiovascular: Normal rate, regular rhythm, normal heart sounds and  intact distal pulses.  Exam reveals no gallop and no friction rub.   No murmur heard. Pulmonary/Chest: Effort normal and breath sounds normal. No stridor. No respiratory distress. She has no wheezes. She has no rales. She exhibits tenderness.  TTP of left lower anterior ribs and superior chest wall  Abdominal: Soft. She exhibits no distension and no mass. There is no tenderness. There is no rebound and no guarding. No hernia.  Neurological: She is alert and oriented to person, place, and time. Coordination normal.  Skin: Skin is warm.  Psychiatric: She has a normal mood and affect. Her behavior is normal. Judgment and thought content normal.  Nursing note and vitals reviewed.  ED Treatments / Results  Labs (all labs ordered are listed, but only abnormal results are displayed) Labs Reviewed  CBC - Abnormal; Notable for the following:       Result Value   WBC 12.1 (*)    Platelets 127 (*)    All other components within normal limits  BASIC METABOLIC PANEL  I-STAT TROPOININ, ED    EKG  EKG Interpretation None       Radiology Dg Chest 2 View  Result Date: 06/05/2016 CLINICAL DATA:  Pt reports intermittent dizziness and pain under left breast x 1 week; she reports fever and productive cough w/ clear/green mucus onset last night; she also reports waking up today with facial pain, hoarseness, and watery eyes; history of heart murmur and asthma. EXAM: CHEST  2 VIEW COMPARISON:  Chest x-ray dated 06/06/2015. FINDINGS: Cardiomediastinal silhouette is normal in size and configuration. Lungs are clear. Lung volumes are normal. No evidence of pneumonia. No pleural effusion. No pneumothorax. Osseous and soft tissue structures about the chest are unremarkable. IMPRESSION: No active cardiopulmonary disease. No evidence of pneumonia or pulmonary edema. Electronically Signed   By: Franki Cabot M.D.   On: 06/05/2016 11:41    Procedures Procedures (including critical care time)  Medications  Ordered in ED Medications  LORazepam (ATIVAN) tablet 2 mg (2 mg Oral Given 06/05/16 1225)     Initial Impression / Assessment and Plan / ED Course  I have reviewed the triage vital signs and the nursing notes.  Pertinent labs & imaging results that were available during my care of the patient were reviewed by me and considered in my medical decision making (see chart for details).      Final Clinical Impressions(s) / ED Diagnoses   Final diagnoses:  Chest wall pain  Anxiety attack  Environmental allergies    Labs: I-STAT troponin, BMP, CBC  Imaging: DG Chest 2 View  Consults:  Therapeutics: Ativan  Discharge Meds: Ativan, Claritin  Assessment/Plan: 36 year old female presents today with numerous complaints.  Patient is most likely having panic attacks.  She became very anxious, tearful with chest discomfort in my presence.  She notes this is similar to previous, she also has chest wall pain that is reproducible on my exam.  Patient was given Ativan which completely resolved her symptoms.  Patient also having what appears to be allergies versus viral URI.  Patient does have a history of allergies, she will be started on Claritin.  She has no signs of significant bacterial infection.  She will follow-up with the primary care for ongoing management, I have very low suspicion for PE, dissection, ACS in this patient.  Patient will be discharged home with strict return precautions.  She verbalized understanding and agreement to today's plan had no further questions or concerns     New Prescriptions Discharge Medication List as of 06/05/2016  1:47 PM    START taking these medications   Details  loratadine (CLARITIN) 10 MG tablet Take 1 tablet (10 mg total) by mouth daily., Starting Sun 06/05/2016, Print    LORazepam (ATIVAN) 1 MG tablet Take 1 tablet (1 mg total) by mouth 3 (three) times daily as needed for anxiety., Starting Sun 06/05/2016, Print         Ahyan Kreeger, [place],  PA-C 06/05/16 1454    Dorie Rank, MD 06/06/16 [phone]

## 2016-06-08 ENCOUNTER — Encounter: Payer: Self-pay | Admitting: Family Medicine

## 2016-07-20 ENCOUNTER — Encounter (HOSPITAL_COMMUNITY): Payer: Self-pay | Admitting: *Deleted

## 2016-07-20 ENCOUNTER — Ambulatory Visit (HOSPITAL_COMMUNITY)
Admission: EM | Admit: 2016-07-20 | Discharge: 2016-07-20 | Disposition: A | Discharge status: Home / Self Care | Payer: Medicaid Other | Attending: Family Medicine | Admitting: Family Medicine

## 2016-07-20 DIAGNOSIS — E860 Dehydration: Secondary | ICD-10-CM | POA: Diagnosis not present

## 2016-07-20 DIAGNOSIS — R112 Nausea with vomiting, unspecified: Secondary | ICD-10-CM | POA: Diagnosis not present

## 2016-07-20 LAB — POCT PREGNANCY, URINE: PREG TEST UR: NEGATIVE

## 2016-07-20 LAB — POCT URINALYSIS DIP (DEVICE)
BILIRUBIN URINE: NEGATIVE
Glucose, UA: NEGATIVE mg/dL
Ketones, ur: 40 mg/dL — AB
NITRITE: NEGATIVE
PH: 6 (ref 5.0–8.0)
PROTEIN: NEGATIVE mg/dL
Specific Gravity, Urine: 1.01 (ref 1.005–1.030)
UROBILINOGEN UA: 0.2 mg/dL (ref 0.0–1.0)

## 2016-07-20 MED ORDER — ONDANSETRON 4 MG PO TBDP: 4.0000 mg | ORAL_TABLET | Freq: Three times a day (TID) | ORAL | 0 refills | Status: DC | PRN

## 2016-07-20 NOTE — ED Triage Notes (Signed)
Pt  Reports   Symptoms  Of  fever   Vomited    Yesterday   Chills   And  Body  Aches      Reports   Some  Frequency  Of  Urination  As   Well        ot  Denies  Any diarhea

## 2016-07-22 NOTE — ED Provider Notes (Addendum)
  [facility]   [id number] 07/20/16 Arrival Time: 6269  ASSESSMENT & PLAN:  1. Non-intractable vomiting with nausea, unspecified vomiting type   2. Dehydration     Meds ordered this encounter  Medications  . ondansetron (ZOFRAN-ODT) 4 MG disintegrating tablet    Sig: Take 1 tablet (4 mg total) by mouth every 8 (eight) hours as needed for nausea or vomiting.    Dispense:  15 tablet    Refill:  0    Will do her best to ensure adequate fluid intake. Hopefully symptoms will calm down within the next 24-48 hours given likely viral etiology. No IVF given in office. She is able to tolerate small amounts of PO fluids. Reviewed expectations re: course of current medical issues. Questions answered. Outlined signs and symptoms indicating need for more acute intervention. Follow up here or in the Emergency Department if worsening. Patient verbalized understanding. After Visit Summary given.   SUBJECTIVE:  Amanda [name] is a 36 y.o. female who presents with complaint of non-bloody emesis and body aces for 24 hours. No diarrhea as of yet. Subjective fever and chills. Overall fatigue. No specific urinary symptoms. Decreased PO intake secondary to nausea. No abdominal pain. No skin changes. No recent travel. Ambulatory. No OTC treatment. No sick contacts.  ROS: As per HPI.   OBJECTIVE:  When completing note I do not see vital signs entered. No tactile skin warmth. General appearance: alert; fatigued appearing. Throat: oral mucosa dry Neck: supple Back: no CVA tenderness Lungs: clear to auscultation bilaterally Heart: regular rate and rhythm Abdomen: soft, non-tender; bowel sounds normal; no masses or organomegaly; no guarding or rebound tenderness; "just crampy when you push" Extremities: extremities normal, atraumatic, no cyanosis or edema   Labs Reviewed  POCT URINALYSIS DIP (DEVICE) - Abnormal; Notable for the following:       Result Value   Ketones, ur 40 (*)     Hgb urine dipstick MODERATE (*)    Leukocytes, UA SMALL (*)    All other components within normal limits  POCT PREGNANCY, URINE     Allergies  Allergen Reactions  . Doxycycline Hyclate Hives, Itching and Swelling    REACTION: swelling of face  . Ketorolac Tromethamine Other (See Comments)    Patient stated that it makes her extremely hyper  . Amoxicillin Swelling and Rash    Has patient had a PCN reaction causing immediate rash, facial/tongue/throat swelling, SOB or lightheadedness with hypotension: Unsure Has patient had a PCN reaction causing severe rash involving mucus membranes or skin necrosis: Unsure Has patient had a PCN reaction that required hospitalization No Has patient had a PCN reaction occurring within the last 10 years: No If all of the above answers are "NO", then may proceed with Cephalosporin use.REACTION: facial swelling and rash.  . Doxycycline Rash    PMHx, SurgHx, SocialHx, Medications, and Allergies were reviewed in the Visit Navigator and updated as appropriate.       Vanessa Kick, MD 07/22/16 [id number]    Vanessa Kick, MD 07/22/16 [phone]

## 2016-08-08 ENCOUNTER — Emergency Department (HOSPITAL_COMMUNITY): Payer: Medicaid Other

## 2016-08-08 ENCOUNTER — Emergency Department (HOSPITAL_COMMUNITY)
Admission: EM | Admit: 2016-08-08 | Discharge: 2016-08-09 | Disposition: A | Discharge status: Home / Self Care | Payer: Medicaid Other | Attending: Physician Assistant | Admitting: Physician Assistant

## 2016-08-08 ENCOUNTER — Encounter (HOSPITAL_COMMUNITY): Payer: Self-pay | Admitting: *Deleted

## 2016-08-08 DIAGNOSIS — R1084 Generalized abdominal pain: Secondary | ICD-10-CM | POA: Diagnosis not present

## 2016-08-08 DIAGNOSIS — N12 Tubulo-interstitial nephritis, not specified as acute or chronic: Secondary | ICD-10-CM | POA: Diagnosis not present

## 2016-08-08 DIAGNOSIS — R350 Frequency of micturition: Secondary | ICD-10-CM | POA: Insufficient documentation

## 2016-08-08 DIAGNOSIS — Z79899 Other long term (current) drug therapy: Secondary | ICD-10-CM | POA: Diagnosis not present

## 2016-08-08 DIAGNOSIS — Z7983 Long term (current) use of bisphosphonates: Secondary | ICD-10-CM | POA: Insufficient documentation

## 2016-08-08 DIAGNOSIS — J45909 Unspecified asthma, uncomplicated: Secondary | ICD-10-CM | POA: Diagnosis not present

## 2016-08-08 DIAGNOSIS — R079 Chest pain, unspecified: Secondary | ICD-10-CM | POA: Diagnosis present

## 2016-08-08 LAB — BASIC METABOLIC PANEL
ANION GAP: 10 (ref 5–15)
BUN: 6 mg/dL (ref 6–20)
CALCIUM: 9.5 mg/dL (ref 8.9–10.3)
CO2: 24 mmol/L (ref 22–32)
Chloride: 102 mmol/L (ref 101–111)
Creatinine, Ser: 0.76 mg/dL (ref 0.44–1.00)
GFR calc Af Amer: >60 mL/min (ref >60)
GFR calc non Af Amer: >60 mL/min (ref >60)
GLUCOSE: 77 mg/dL (ref 65–99)
Potassium: 3.8 mmol/L (ref 3.5–5.1)
Sodium: 136 mmol/L (ref 135–145)

## 2016-08-08 LAB — URINALYSIS, ROUTINE W REFLEX MICROSCOPIC
Bacteria, UA: NONE SEEN
Bilirubin Urine: NEGATIVE
GLUCOSE, UA: NEGATIVE mg/dL
KETONES UR: NEGATIVE mg/dL
NITRITE: NEGATIVE
PH: 7 (ref 5.0–8.0)
Protein, ur: 30 mg/dL — AB
Specific Gravity, Urine: 1.002 — ABNORMAL LOW (ref 1.005–1.030)

## 2016-08-08 LAB — CBC
HEMATOCRIT: 38.5 % (ref 36.0–46.0)
HEMOGLOBIN: 12.7 g/dL (ref 12.0–15.0)
MCH: 26.2 pg (ref 26.0–34.0)
MCHC: 33 g/dL (ref 30.0–36.0)
MCV: 79.4 fL (ref 78.0–100.0)
Platelets: DECREASED 10*3/uL (ref 150–400)
RBC: 4.85 MIL/uL (ref 3.87–5.11)
RDW: 15.6 % — AB (ref 11.5–15.5)
WBC: 14.2 10*3/uL — ABNORMAL HIGH (ref 4.0–10.5)

## 2016-08-08 LAB — POC URINE PREG, ED: Preg Test, Ur: NEGATIVE

## 2016-08-08 LAB — I-STAT TROPONIN, ED: Troponin i, poc: 0 ng/mL (ref 0.00–0.08)

## 2016-08-08 MED ORDER — OXYCODONE-ACETAMINOPHEN 5-325 MG PO TABS
ORAL_TABLET | ORAL | Status: AC
  Administered 2016-08-09: 1
  Filled 2016-08-08: qty 1

## 2016-08-08 MED ORDER — ONDANSETRON 4 MG PO TBDP
ORAL_TABLET | ORAL | Status: AC
  Filled 2016-08-08: qty 1

## 2016-08-08 MED ORDER — ONDANSETRON 4 MG PO TBDP
4.0000 mg | ORAL_TABLET | Freq: Once | ORAL | Status: AC | PRN
  Administered 2016-08-08: 4 mg via ORAL

## 2016-08-08 MED ORDER — OXYCODONE-ACETAMINOPHEN 5-325 MG PO TABS
1.0000 | ORAL_TABLET | ORAL | Status: DC | PRN
  Administered 2016-08-08: 1 via ORAL
  Filled 2016-08-08: qty 1

## 2016-08-08 NOTE — ED Notes (Addendum)
Patient transported to CT 

## 2016-08-08 NOTE — ED Triage Notes (Signed)
To ED for eval of flank pain, lower abd pain, and cp that started last night. Nausea but no vomiting. Very tearful in triage.

## 2016-08-08 NOTE — ED Provider Notes (Signed)
[facility] DEPT Provider Note   CSN: [id number] Arrival date & time: 08/08/16  1818     History   Chief Complaint Chief Complaint  Patient presents with  . Chest Pain  . Flank Pain  . Urinary Frequency    HPI Amanda [name] is a 36 y.o. female history of panic attacks, anxiety, migraines, GERD, hiatal hernia presenting with chest pain under her left breast. She reports that this all started yesterday when also in her nipples got really hard and painful bilaterally contracting. She subsequently experienced chest pain which she associated with her anxiety. She also notes back pain on urination which began yesterday and urinary frequency. She endorses subjective fevers and chills. She experienced some nausea but no vomiting, denies diarrhea she states that she no longer has nausea after being given Zofran.  HPI  Past Medical History:  Diagnosis Date  . Anginal pain ([place])   . Anxiety   . Asthma   . Constipation   . Depression   . Dysrhythmia    Palpatations  . Genital warts   . GERD (gastroesophageal reflux disease)   . H/O hiatal hernia   . Headache(784.0)   . Heart murmur   . MRSA cellulitis     Patient Active Problem List   Diagnosis Date Noted  . Low back pain 12/16/2015  . H. pylori infection 11/02/2015  . Intractable chronic migraine without aura and without status migrainosus 03/19/2015  . Morbid obesity ([place]) 03/19/2015  . Murmur 06/20/2012  . INSOMNIA 08/14/2009  . DYSMENORRHEA 12/09/2008  . FATIGUE 03/25/2008  . DEPRESSION/ANXIETY 09/11/2007  . HEADACHE 09/11/2007  . PALPITATIONS 02/23/2007  . CHEST PAIN 02/23/2007  . ALLERGIC RHINITIS 10/06/2006  . ASTHMA 10/06/2006  . GERD 10/06/2006    Past Surgical History:  Procedure Laterality Date  . CERVICAL CONE BIOPSY    . CESAREAN SECTION  01/20/2012   Procedure: CESAREAN SECTION;  Surgeon: Melina Schools, MD;  Location: [facility];  Service: Obstetrics;  Laterality: N/A;  . COLONOSCOPY    .  HERNIA REPAIR    . INSERTION OF MESH N/A 05/15/2015   Procedure: INSERTION OF MESH;  Surgeon: Ralene Ok, MD;  Location: [facility];  Service: General;  Laterality: N/A;  . MARSUPIALIZATION URETHRAL DIVERTICULUM    . UMBILICAL HERNIA REPAIR N/A 05/15/2015   Procedure: LAPAROSCOPIC UMBILICAL HERNIA;  Surgeon: Ralene Ok, MD;  Location: [facility];  Service: General;  Laterality: N/A;  . UPPER GI ENDOSCOPY      OB History    Gravida Para Term Preterm AB Living   3 1 1  0 1 1   SAB TAB Ectopic Multiple Live Births   1 0 0 0 1       Home Medications    Prior to Admission medications   Medication Sig Start Date End Date Taking? Authorizing Provider  calcium carbonate (TUMS - DOSED IN MG ELEMENTAL CALCIUM) 500 MG chewable tablet Chew 1 tablet by mouth as needed for indigestion or heartburn.   Yes [provider]  cyclobenzaprine (FLEXERIL) 10 MG tablet May use every 8 hours for headache Patient taking differently: Take 10 mg by mouth. May use every 8 hours for headache 10/21/15  Yes Jaffe, Adam R, DO  Ibuprofen (MIDOL) 200 MG CAPS Take 200 mg by mouth every 6 (six) hours as needed (for cramps).   Yes [provider]  loratadine (CLARITIN) 10 MG tablet Take 1 tablet (10 mg total) by mouth daily. Patient taking differently: Take 10 mg  by mouth daily as needed for allergies.  06/05/16  Yes Hedges, Dellis Filbert, PA-C  acetaminophen (TYLENOL) 500 MG tablet Take 1 tablet (500 mg total) by mouth every 6 (six) hours as needed for mild pain or moderate pain. 08/09/16   Avie Echevaria B, PA-C  albuterol (PROVENTIL HFA;VENTOLIN HFA) 108 (90 Base) MCG/ACT inhaler Inhale 2 puffs into the lungs every 4 (four) hours as needed for wheezing or shortness of breath. Patient not taking: Reported on 06/05/2016 06/06/15   Horton, Barbette Hair, MD  LORazepam (ATIVAN) 1 MG tablet Take 1 tablet (1 mg total) by mouth 3 (three) times daily as needed for anxiety. Patient not taking: Reported on 08/08/2016  06/05/16   Hedges, Dellis Filbert, PA-C  metroNIDAZOLE (METROGEL VAGINAL) 0.75 % vaginal gel Place 1 Applicatorful vaginally 2 (two) times daily. Patient not taking: Reported on 05/09/2016 03/14/16   Jorje Guild, NP  omeprazole (PRILOSEC) 20 MG capsule Take 1 capsule (20 mg total) by mouth 2 (two) times daily before a meal. Patient not taking: Reported on 08/08/2016 12/23/15   Arnoldo Morale, MD  ondansetron (ZOFRAN ODT) 4 MG disintegrating tablet Take 1 tablet (4 mg total) by mouth every 8 (eight) hours as needed for nausea or vomiting. 08/09/16   Emeline General, PA-C  promethazine (PHENERGAN) 25 MG tablet Take 1 tablet (25 mg total) by mouth every 6 (six) hours as needed for nausea or vomiting. Patient not taking: Reported on 06/05/2016 03/14/16   Jorje Guild, NP  sulfamethoxazole-trimethoprim (BACTRIM DS,SEPTRA DS) 800-160 MG tablet Take 1 tablet by mouth 2 (two) times daily. 08/09/16 08/16/16  Emeline General, PA-C    Family History Family History  Problem Relation Age of Onset  . Diabetes Mother   . Hypertension Mother   . Depression Mother   . Depression Father   . Heart disease Maternal Grandmother   . Breast cancer Maternal Aunt        [employer]  . Diabetes Maternal Grandfather   . Heart disease Maternal Grandfather   . Hypertension Maternal Grandfather   . Anesthesia problems Neg Hx     Social History Social History  Substance Use Topics  . Smoking status: Never Smoker  . Smokeless tobacco: Never Used  . Alcohol use No     Allergies   Doxycycline hyclate; Ketorolac tromethamine; Amoxicillin; and Doxycycline   Review of Systems Review of Systems  Constitutional: Positive for chills and fever.  HENT: Negative for ear pain and sore throat.   Eyes: Negative for pain and visual disturbance.  Respiratory: Negative for cough and shortness of breath.   Cardiovascular: Positive for chest pain. Negative for palpitations and leg swelling.  Gastrointestinal: Positive for  nausea. Negative for abdominal distention, abdominal pain, blood in stool, diarrhea and vomiting.  Genitourinary: Positive for frequency and urgency. Negative for difficulty urinating, dysuria and hematuria.  Musculoskeletal: Positive for back pain. Negative for arthralgias, myalgias, neck pain and neck stiffness.  Skin: Negative for color change, pallor and rash.  Neurological: Negative for seizures, syncope and weakness.     Physical Exam Updated Vital Signs BP (!) 111/51   Pulse 92   Temp 99.5 F (37.5 C) (Oral)   Resp (!) 26   LMP 07/21/2016   SpO2 100%   Physical Exam  Constitutional: She appears well-developed and well-nourished. No distress.  Afebrile, nontoxic-appearing, tearful and uncomfortable appearing, no acute distress.  HENT:  Head: Normocephalic and atraumatic.  Eyes: Conjunctivae are normal.  Neck: Neck supple.  Cardiovascular: Normal rate, regular  rhythm and normal heart sounds.   No murmur heard. Pulmonary/Chest: Effort normal and breath sounds normal. No respiratory distress.  Abdominal: Soft. There is no tenderness.  Musculoskeletal: Normal range of motion. She exhibits no edema.  Neurological: She is alert.  Skin: Skin is warm and dry. She is not diaphoretic.  Psychiatric: She has a normal mood and affect.  Nursing note and vitals reviewed.    ED Treatments / Results  Labs (all labs ordered are listed, but only abnormal results are displayed) Labs Reviewed  URINALYSIS, ROUTINE W REFLEX MICROSCOPIC - Abnormal; Notable for the following:       Result Value   Color, Urine STRAW (*)    Specific Gravity, Urine 1.002 (*)    Hgb urine dipstick MODERATE (*)    Protein, ur 30 (*)    Leukocytes, UA MODERATE (*)    Squamous Epithelial / LPF 0-5 (*)    All other components within normal limits  CBC - Abnormal; Notable for the following:    WBC 14.2 (*)    RDW 15.6 (*)    All other components within normal limits  URINE CULTURE  BASIC METABOLIC PANEL    POC URINE PREG, ED  I-STAT TROPOININ, ED    EKG  EKG Interpretation None       Radiology Ct Renal Stone Study  Result Date: 08/09/2016 CLINICAL DATA:  Initial evaluation for acute back pain, hematuria. EXAM: CT ABDOMEN AND PELVIS WITHOUT CONTRAST TECHNIQUE: Multidetector CT imaging of the abdomen and pelvis was performed following the standard protocol without IV contrast. COMPARISON:  Prior CT from 02/17/2015. FINDINGS: Lower chest: Visualized lung bases are clear. Hepatobiliary: Liver demonstrates a normal unenhanced appearance. Gallbladder within normal limits. No biliary dilatation. Pancreas: Pancreas within normal limits. Spleen: Spleen within normal limits. Adrenals/Urinary Tract: Adrenal glands are normal. Kidneys equal in size without evidence for nephrolithiasis or hydronephrosis. No radiopaque calculi seen along the course of either renal collecting system. No hydroureter. Bladder partially distended without acute abnormality. No layering stones within the bladder lumen. Stomach/Bowel: Small hiatal hernia noted. Stomach otherwise unremarkable. No evidence for bowel obstruction. Appendix within normal limits. No acute inflammatory changes seen about the bowels. Vascular/Lymphatic: Intra-abdominal aorta of normal caliber. No adenopathy. Reproductive: Uterus and ovaries within normal limits for age. Other: No free air or fluid. Musculoskeletal: No acute osseous abnormality. No worrisome lytic or blastic osseous lesions. IMPRESSION: 1. No CT evidence for nephrolithiasis or obstructive uropathy. 2. No other acute intra-abdominal or pelvic process identified. Electronically Signed   By: Jeannine Boga M.D.   On: 08/09/2016 00:30    Procedures Procedures (including critical care time)  Medications Ordered in ED Medications  oxyCODONE-acetaminophen (PERCOCET/ROXICET) 5-325 MG per tablet 1 tablet (1 tablet Oral Given 08/08/16 1854)  ondansetron (ZOFRAN-ODT) 4 MG disintegrating tablet  (not administered)  ondansetron (ZOFRAN-ODT) disintegrating tablet 4 mg (4 mg Oral Given 08/08/16 1854)  oxyCODONE-acetaminophen (PERCOCET/ROXICET) 5-325 MG per tablet (1 tablet  Given 08/09/16 0008)     Initial Impression / Assessment and Plan / ED Course  I have reviewed the triage vital signs and the nursing notes.  Pertinent labs & imaging results that were available during my care of the patient were reviewed by me and considered in my medical decision making (see chart for details).    Patient presents with multiple complaints including frequency, back pain, subjective fevers and chills, chest discomfort under her left breast, nipple cramping hardening.  Urine shows signs of infection and microscopic hematuria Ordered a  CT renal to evaluate for stone  Slightly elevated white count Suspect uncomplicated pyelo  CT renal without evidence of nephrolithiasis. Heart score: 1, Very low suspicion for ACS in this patient, her pain is under her breast and reproducible with palpation she reports having similar pain in the past and having a mammogram that was negative. I suspect that anxiety is contributing to patient's symptoms. She describes pains that are moving around and multiple complaints that are inconsistent.  Urged patient to follow up with primary care. She was well-appearing nontoxic afebrile and stable for discharge.  Discharge home with antibiotics and symptomatic relief. Advised patient to stay well-hydrated.  Discussed strict return precautions and advised to return to the emergency department if experiencing any new or worsening symptoms. Instructions were understood and patient agreed with discharge plan.  Final Clinical Impressions(s) / ED Diagnoses   Final diagnoses:  Pyelonephritis    New Prescriptions New Prescriptions   ACETAMINOPHEN (TYLENOL) 500 MG TABLET    Take 1 tablet (500 mg total) by mouth every 6 (six) hours as needed for mild pain or moderate pain.    ONDANSETRON (ZOFRAN ODT) 4 MG DISINTEGRATING TABLET    Take 1 tablet (4 mg total) by mouth every 8 (eight) hours as needed for nausea or vomiting.   SULFAMETHOXAZOLE-TRIMETHOPRIM (BACTRIM DS,SEPTRA DS) 800-160 MG TABLET    Take 1 tablet by mouth 2 (two) times daily.     Emeline General, PA-C 08/09/16 0108    Macarthur Critchley, MD 08/10/16 [phone]

## 2016-08-08 NOTE — ED Notes (Signed)
[facility] at [facility] requesting update, wants to know why she isnt going back yet even though she is having CP. Apologized for delay and explained acuity to pt.

## 2016-08-09 MED ORDER — ACETAMINOPHEN 500 MG PO TABS: 500.0000 mg | ORAL_TABLET | Freq: Four times a day (QID) | ORAL | 0 refills | Status: DC | PRN

## 2016-08-09 MED ORDER — ONDANSETRON 4 MG PO TBDP: 4.0000 mg | ORAL_TABLET | Freq: Three times a day (TID) | ORAL | 0 refills | Status: DC | PRN

## 2016-08-09 MED ORDER — SULFAMETHOXAZOLE-TRIMETHOPRIM 800-160 MG PO TABS: 1.0000 | ORAL_TABLET | Freq: Two times a day (BID) | ORAL | 0 refills | Status: AC

## 2016-08-09 NOTE — Discharge Instructions (Signed)
As discussed, take her entire course of antibiotics even if you feel better. Drink plenty of fluids and keep your urine clear. Zofran for nausea as needed and Tylenol for pain as needed. You can try applying heat to your back for comfort.  Your EKG and heart enzyme were reassuring today. Your labs are normal. Your CT scan did not show a kidney stone or any intra-abdominal pelvic abnormalities.  Follow-up with your primary care provider. Return if symptoms worsen or you experience new concerning symptoms in the meantime.

## 2016-08-11 LAB — URINE CULTURE

## 2016-08-12 ENCOUNTER — Telehealth: Payer: Self-pay

## 2016-08-12 NOTE — Telephone Encounter (Signed)
Post ED Visit - Positive Culture Follow-up  Culture report reviewed by antimicrobial stewardship pharmacist:  []  Elenor Quinones, Pharm.D. []  Heide Guile, Pharm.D., BCPS AQ-ID []  Parks Neptune, Pharm.D., BCPS []  Alycia Rossetti, Pharm.D., BCPS []  [place], Pharm.D., BCPS, AAHIVP [x]  Legrand Como, Pharm.D., BCPS, AAHIVP []  Salome Arnt, PharmD, BCPS []  Dimitri Ped, PharmD, BCPS []  Vincenza Hews, PharmD, BCPS  Positive urine culture Treated with Sulfamethoxazole Trimethoprim, organism sensitive to the same and no further patient follow-up is required at this time.  Genia Del 08/12/2016, 12:50 PM

## 2016-08-16 ENCOUNTER — Emergency Department (HOSPITAL_COMMUNITY)
Admission: EM | Admit: 2016-08-16 | Discharge: 2016-08-16 | Disposition: A | Discharge status: Home / Self Care | Payer: Medicaid Other | Attending: Emergency Medicine | Admitting: Emergency Medicine

## 2016-08-16 ENCOUNTER — Encounter (HOSPITAL_COMMUNITY): Payer: Self-pay | Admitting: Emergency Medicine

## 2016-08-16 DIAGNOSIS — F419 Anxiety disorder, unspecified: Secondary | ICD-10-CM | POA: Diagnosis not present

## 2016-08-16 DIAGNOSIS — F410 Panic disorder [episodic paroxysmal anxiety] without agoraphobia: Secondary | ICD-10-CM

## 2016-08-16 DIAGNOSIS — Z79899 Other long term (current) drug therapy: Secondary | ICD-10-CM | POA: Insufficient documentation

## 2016-08-16 DIAGNOSIS — J45909 Unspecified asthma, uncomplicated: Secondary | ICD-10-CM | POA: Diagnosis not present

## 2016-08-16 LAB — URINALYSIS, ROUTINE W REFLEX MICROSCOPIC
Bilirubin Urine: NEGATIVE
Glucose, UA: NEGATIVE mg/dL
KETONES UR: 20 mg/dL — AB
Nitrite: NEGATIVE
PROTEIN: 100 mg/dL — AB
Specific Gravity, Urine: 1.017 (ref 1.005–1.030)
pH: 5 (ref 5.0–8.0)

## 2016-08-16 LAB — I-STAT TROPONIN, ED: Troponin i, poc: 0 ng/mL (ref 0.00–0.08)

## 2016-08-16 LAB — POC URINE PREG, ED: PREG TEST UR: NEGATIVE

## 2016-08-16 MED ORDER — LORAZEPAM 1 MG PO TABS
1.0000 mg | ORAL_TABLET | Freq: Once | ORAL | Status: AC
  Administered 2016-08-16: 1 mg via ORAL
  Filled 2016-08-16: qty 1

## 2016-08-16 MED ORDER — LORAZEPAM 1 MG PO TABS
0.5000 mg | ORAL_TABLET | Freq: Once | ORAL | Status: AC
  Administered 2016-08-16: 0.5 mg via ORAL
  Filled 2016-08-16: qty 1

## 2016-08-16 NOTE — Discharge Instructions (Signed)
As we discussed, he need to follow up with one of the outpatient resources listed in the papers.  Return the emergency Department for any chest pain, difficulty breathing, fever, vomiting, abdominal pain thoughts of wanting to hurt yourself, or any other worsening or concerning symptoms.

## 2016-08-16 NOTE — ED Triage Notes (Signed)
Pt. Continues to cry in triage.

## 2016-08-16 NOTE — ED Triage Notes (Signed)
Pt. Stated, I've had anxiety, with panic attacks, Im thinking something is wrong all the time.  I just can't get a grip.

## 2016-08-16 NOTE — ED Provider Notes (Signed)
[facility] DEPT Provider Note   CSN: [id number] Arrival date & time: 08/16/16  0935     History   Chief Complaint Chief Complaint  Patient presents with  . Panic Attack  . Anxiety    HPI Amanda [name] is a 36 y.o. female with past medical history of anxiety who presents with 3 weeks of progressively worsening anxiety and panic attacks. Patient states that she has a history of anxiety was previously on anxiety medication prior to the birth of her daughter. Patient reports that for the last 3 weeks she has had intermittent panic attacks that have become more frequent and more severe. Patient states that she is still able to carry on her daily activities but has multiple panic attacks throughout the day which often required a stop when she is doing. Patient states that when she has a panic attack she gets very short of breath, tight in her chest and back and gets very worried and tearful. Patient states that her last panic attack was earlier this morning.  Patient states that she came to the ED today because she "just can't fight it any more." Patient states that approximately 2 years ago she was being evaluated by [facility] facility for her anxiety, but she states that she stopped going to them because they were only doing talk therapy and would not prescribe her any medication. Patient reports that she is very stressed over multiple different issues that are ongoing right now, which has contributed to her anxiety. Patient reports that she had some nausea and vomiting that has been ongoing for the past 2 weeks. She reports that she has had constant chest pain with no relief of pain for the last 2 weeks. Patient denies any SI/HI, auditory/visual hallucinations, dysuria, hematuria, abdominal pain. She denies any OCP use, recent immobilization, prior history of DVT/PE, recent surgery, leg swelling, or long travel.     The history is provided by the patient.    Past Medical History:    Diagnosis Date  . Anginal pain ([place])   . Anxiety   . Asthma   . Constipation   . Depression   . Dysrhythmia    Palpatations  . Genital warts   . GERD (gastroesophageal reflux disease)   . H/O hiatal hernia   . Headache(784.0)   . Heart murmur   . MRSA cellulitis     Patient Active Problem List   Diagnosis Date Noted  . Low back pain 12/16/2015  . H. pylori infection 11/02/2015  . Intractable chronic migraine without aura and without status migrainosus 03/19/2015  . Morbid obesity ([place]) 03/19/2015  . Murmur 06/20/2012  . INSOMNIA 08/14/2009  . DYSMENORRHEA 12/09/2008  . FATIGUE 03/25/2008  . DEPRESSION/ANXIETY 09/11/2007  . HEADACHE 09/11/2007  . PALPITATIONS 02/23/2007  . CHEST PAIN 02/23/2007  . ALLERGIC RHINITIS 10/06/2006  . ASTHMA 10/06/2006  . GERD 10/06/2006    Past Surgical History:  Procedure Laterality Date  . CERVICAL CONE BIOPSY    . CESAREAN SECTION  01/20/2012   Procedure: CESAREAN SECTION;  Surgeon: Melina Schools, MD;  Location: [facility];  Service: Obstetrics;  Laterality: N/A;  . COLONOSCOPY    . HERNIA REPAIR    . INSERTION OF MESH N/A 05/15/2015   Procedure: INSERTION OF MESH;  Surgeon: Ralene Ok, MD;  Location: [facility];  Service: General;  Laterality: N/A;  . MARSUPIALIZATION URETHRAL DIVERTICULUM    . UMBILICAL HERNIA REPAIR N/A 05/15/2015   Procedure: LAPAROSCOPIC UMBILICAL HERNIA;  Surgeon: Anne Hahn  Rosendo Gros, MD;  Location: [facility];  Service: General;  Laterality: N/A;  . UPPER GI ENDOSCOPY      OB History    Gravida Para Term Preterm AB Living   3 1 1  0 1 1   SAB TAB Ectopic Multiple Live Births   1 0 0 0 1       Home Medications    Prior to Admission medications   Medication Sig Start Date End Date Taking? Authorizing Provider  acetaminophen (TYLENOL) 500 MG tablet Take 1 tablet (500 mg total) by mouth every 6 (six) hours as needed for mild pain or moderate pain. 08/09/16  Yes Avie Echevaria B, PA-C  Ibuprofen (MIDOL) 200 MG  CAPS Take 200 mg by mouth every 6 (six) hours as needed (for cramps).   Yes [provider]  albuterol (PROVENTIL HFA;VENTOLIN HFA) 108 (90 Base) MCG/ACT inhaler Inhale 2 puffs into the lungs every 4 (four) hours as needed for wheezing or shortness of breath. Patient not taking: Reported on 06/05/2016 06/06/15   Merryl Hacker, MD  cyclobenzaprine (FLEXERIL) 10 MG tablet May use every 8 hours for headache Patient not taking: Reported on 08/16/2016 10/21/15   Pieter Partridge, DO  loratadine (CLARITIN) 10 MG tablet Take 1 tablet (10 mg total) by mouth daily. Patient not taking: Reported on 08/16/2016 06/05/16   Hedges, Dellis Filbert, PA-C  LORazepam (ATIVAN) 1 MG tablet Take 1 tablet (1 mg total) by mouth 3 (three) times daily as needed for anxiety. Patient not taking: Reported on 08/08/2016 06/05/16   Hedges, Dellis Filbert, PA-C  metroNIDAZOLE (METROGEL VAGINAL) 0.75 % vaginal gel Place 1 Applicatorful vaginally 2 (two) times daily. Patient not taking: Reported on 05/09/2016 03/14/16   Jorje Guild, NP  omeprazole (PRILOSEC) 20 MG capsule Take 1 capsule (20 mg total) by mouth 2 (two) times daily before a meal. Patient not taking: Reported on 08/08/2016 12/23/15   Arnoldo Morale, MD  ondansetron (ZOFRAN ODT) 4 MG disintegrating tablet Take 1 tablet (4 mg total) by mouth every 8 (eight) hours as needed for nausea or vomiting. Patient not taking: Reported on 08/16/2016 08/09/16   Emeline General, PA-C  promethazine (PHENERGAN) 25 MG tablet Take 1 tablet (25 mg total) by mouth every 6 (six) hours as needed for nausea or vomiting. Patient not taking: Reported on 06/05/2016 03/14/16   Jorje Guild, NP    Family History Family History  Problem Relation Age of Onset  . Diabetes Mother   . Hypertension Mother   . Depression Mother   . Depression Father   . Heart disease Maternal Grandmother   . Breast cancer Maternal Aunt        [employer]  . Diabetes Maternal Grandfather   . Heart disease Maternal  Grandfather   . Hypertension Maternal Grandfather   . Anesthesia problems Neg Hx     Social History Social History  Substance Use Topics  . Smoking status: Never Smoker  . Smokeless tobacco: Never Used  . Alcohol use No     Allergies   Doxycycline hyclate; Ketorolac tromethamine; and Amoxicillin   Review of Systems Review of Systems  Constitutional: Negative for chills and fever.  Eyes: Negative for visual disturbance.  Respiratory: Negative for cough and shortness of breath.   Cardiovascular: Positive for chest pain.  Gastrointestinal: Positive for nausea and vomiting. Negative for abdominal pain, blood in stool and diarrhea.  Genitourinary: Negative for dysuria and hematuria.  Musculoskeletal: Negative for back pain and neck pain.  Skin: Negative  for rash.  Neurological: Negative for dizziness and headaches.  Psychiatric/Behavioral: The patient is nervous/anxious.      Physical Exam Updated Vital Signs BP 115/80   Pulse (!) 111   Temp 98.3 F (36.8 C) (Oral)   Resp 16   Ht 5\' 3"  (1.6 m)   Wt 108.9 kg (240 lb)   LMP 08/13/2016   SpO2 100%   BMI 42.51 kg/m   Physical Exam  Constitutional: She is oriented to person, place, and time. She appears well-developed and well-nourished.  Very anxious, intermittently tearful  HENT:  Head: Normocephalic and atraumatic.  Mouth/Throat: Oropharynx is clear and moist and mucous membranes are normal.  Eyes: Pupils are equal, round, and reactive to light. Conjunctivae, EOM and lids are normal.  Neck: Full passive range of motion without pain.  Cardiovascular: Normal rate, regular rhythm, normal heart sounds and normal pulses.  Exam reveals no gallop and no friction rub.   No murmur heard. Pulmonary/Chest: Effort normal and breath sounds normal.  Abdominal: Soft. Normal appearance. There is no tenderness. There is no rigidity and no guarding.  Musculoskeletal: Normal range of motion.  Neurological: She is alert and oriented  to person, place, and time.  Skin: Skin is warm and dry. Capillary refill takes less than 2 seconds.  Psychiatric: Her speech is normal. Her mood appears anxious. Impaired:    Makes good eye contact throughout interview but is crying throughout most of the examination  Nursing note and vitals reviewed.    ED Treatments / Results  Labs (all labs ordered are listed, but only abnormal results are displayed) Labs Reviewed  URINALYSIS, ROUTINE W REFLEX MICROSCOPIC - Abnormal; Notable for the following:       Result Value   APPearance HAZY (*)    Hgb urine dipstick LARGE (*)    Ketones, ur 20 (*)    Protein, ur 100 (*)    Leukocytes, UA TRACE (*)    Bacteria, UA RARE (*)    Squamous Epithelial / LPF 0-5 (*)    All other components within normal limits  POC URINE PREG, ED  I-STAT TROPONIN, ED    EKG  EKG Interpretation  Date/Time:  Tuesday August 16 2016 11:27:25 EDT Ventricular Rate:  102 PR Interval:    QRS Duration: 87 QT Interval:  328 QTC Calculation: 428 R Axis:   70 Text Interpretation:  Sinus tachycardia No significant change since last tracing Confirmed by Orlie Dakin [phone]) on 08/17/2016 5:40:21 PM       Radiology No results found.  Procedures Procedures (including critical care time)  Medications Ordered in ED Medications  LORazepam (ATIVAN) tablet 1 mg (1 mg Oral Given 08/16/16 1129)  LORazepam (ATIVAN) tablet 0.5 mg (0.5 mg Oral Given 08/16/16 1435)     Initial Impression / Assessment and Plan / ED Course  I have reviewed the triage vital signs and the nursing notes.  Pertinent labs & imaging results that were available during my care of the patient were reviewed by me and considered in my medical decision making (see chart for details).     73 -year-old female with past medical history anxiety presents with panic attacks and anxiety. Is been ongoing for the last 3 weeks. No specific trigger that started it. Patient is afebrile, non-toxic appearing,  sitting comfortably on examination table. Vital signs reviewed and stable. Patient is slightly tachycardic, suspect from anxiety. Consider panic attack versus anxiety. History/physical exam or not concerning for ACS etiology or PE. Will plan to  check urine, urine pregnancy, EKG, troponin. We'll plan to give anxiolytic for symptomatic relief. Discussed at length with patient regarding treatment options. I offered to medically clear her and have her evaluated by TTS but patient does not want that at this time. We also discussed treating the anxiety in the department and providing outpatient resources, which patient states would be better for her.   Labs reviewed. I-STAT troponin is negative. Urine pregnancy is negative. Urine shows hemoglobin but patient states she is currently on her period. Patient does have trace leukocytes but when compared to previous urinary looks improved. She has just completed a course of Bactrim for UTI. Discussed results with patient. Patient reports that she feels much better after the anxiety medication. She denies any current chest pain or difficulty breathing at this time. Offered TTS evaluation again but patient does not wish to have that at this time. We discussed in terms of following up with outpatient resources and patient is agreeable to that plan. Patient is stable for discharge at this time. Strict return precautions discussed. Patient expresses understanding and agreement to plan.    Final Clinical Impressions(s) / ED Diagnoses   Final diagnoses:  Anxiety  Panic attack    New Prescriptions Discharge Medication List as of 08/16/2016  2:26 PM       Volanda Napoleon, PA-C 08/17/16 2018    Jola Schmidt, MD 08/18/16 2329

## 2016-10-03 ENCOUNTER — Telehealth: Payer: Self-pay | Admitting: Neurology

## 2016-10-03 NOTE — Telephone Encounter (Signed)
Patient stopped by regarding needing to set up a Botox appointment. She had talked with Corey Skains last year regarding Botox but the patient said she never heard anything else from our office regarding setting up Botox appointments. She was seen in 07/2015.  Please Advise.  She has a follow up appointment scheduled. Thanks

## 2016-10-04 NOTE — Telephone Encounter (Signed)
Called and spoke w/Pt, advsd her will discuss at appt in Oct. Pt was concerned we would disregard her like we've done before, assured her we will be happy to see her and help her in any way we can

## 2016-11-15 ENCOUNTER — Encounter: Payer: Self-pay | Admitting: Neurology

## 2016-11-15 ENCOUNTER — Ambulatory Visit (INDEPENDENT_AMBULATORY_CARE_PROVIDER_SITE_OTHER): Payer: Medicaid Other | Admitting: Neurology

## 2016-11-15 VITALS — BP 104/62 | HR 84 | Ht 63.0 in | Wt 271.0 lb

## 2016-11-15 DIAGNOSIS — G43719 Chronic migraine without aura, intractable, without status migrainosus: Secondary | ICD-10-CM | POA: Diagnosis not present

## 2016-11-15 MED ORDER — SUMATRIPTAN 20 MG/ACT NA SOLN: NASAL | 3 refills | Status: DC

## 2016-11-15 NOTE — Progress Notes (Signed)
NEUROLOGY FOLLOW UP OFFICE NOTE  Amanda [name] [id number]  HISTORY OF PRESENT ILLNESS: Amanda [name] is a 36 year old right-handed woman with asthma, depression, anxiety and morbid obesity who follows up for chronic migraine.     UPDATE: She was last seen on 08/19/15.  At that time, I recommended Botox.  However, she never heard back from anyone. Intensity:  Moderate to severe Duration: all day Frequency:  daily Current medication:  Flexeril as needed.  Also ice packs Current preventative medication:  none  Caffeine:  no Alcohol:  no Smoker:  no Diet:  Cut out pork, cheese, chocolate.  Increased water intake Exercise:  walking Depression/stress:  stress Sleep hygiene:  poor   HISTORY: Onset:  adolescence Location:  Holocephalic from neck and shoulders to behind the eyes Quality:  Throbbing, pressure Initial intensity:  10/10; February 6-10/10 Aura:  no Prodrome:  no Associated symptoms:  Dizziness, nausea, photophobia, phonophobia Initial Duration:  constant Initial Frequency:  Daily (16-20 days severe) Triggers/exacerbating factors:  Stress, getting upset, menstrual cycle Relieving factors:  none Activity:  Difficult to function when severe   Past abortive therapy:  BC, Advil, Tylenol, Excedrin, Aleve, Sumatriptan 100mg , Maxalt, Relpax Past preventative therapy:  Trigger point injections, Zoloft (for depression), topamax 100mg  (ineffective), nortriptyline 25mg  (side effects)    Family history of headache:  no  PAST MEDICAL HISTORY: Past Medical History:  Diagnosis Date  . Anginal pain ([place])   . Anxiety   . Asthma   . Constipation   . Depression   . Dysrhythmia    Palpatations  . Genital warts   . GERD (gastroesophageal reflux disease)   . H/O hiatal hernia   . Headache(784.0)   . Heart murmur   . MRSA cellulitis     MEDICATIONS: Current Outpatient Prescriptions on File Prior to Visit  Medication Sig Dispense Refill  . acetaminophen  (TYLENOL) 500 MG tablet Take 1 tablet (500 mg total) by mouth every 6 (six) hours as needed for mild pain or moderate pain. 30 tablet 0  . albuterol (PROVENTIL HFA;VENTOLIN HFA) 108 (90 Base) MCG/ACT inhaler Inhale 2 puffs into the lungs every 4 (four) hours as needed for wheezing or shortness of breath. (Patient not taking: Reported on 06/05/2016) 1 Inhaler 0  . cyclobenzaprine (FLEXERIL) 10 MG tablet May use every 8 hours for headache (Patient not taking: Reported on 08/16/2016) 60 tablet 4  . Ibuprofen (MIDOL) 200 MG CAPS Take 200 mg by mouth every 6 (six) hours as needed (for cramps).    . loratadine (CLARITIN) 10 MG tablet Take 1 tablet (10 mg total) by mouth daily. (Patient not taking: Reported on 08/16/2016) 30 tablet 0  . LORazepam (ATIVAN) 1 MG tablet Take 1 tablet (1 mg total) by mouth 3 (three) times daily as needed for anxiety. (Patient not taking: Reported on 08/08/2016) 6 tablet 0  . metroNIDAZOLE (METROGEL VAGINAL) 0.75 % vaginal gel Place 1 Applicatorful vaginally 2 (two) times daily. (Patient not taking: Reported on 05/09/2016) 70 g 0  . omeprazole (PRILOSEC) 20 MG capsule Take 1 capsule (20 mg total) by mouth 2 (two) times daily before a meal. (Patient not taking: Reported on 08/08/2016) 28 capsule 0  . ondansetron (ZOFRAN ODT) 4 MG disintegrating tablet Take 1 tablet (4 mg total) by mouth every 8 (eight) hours as needed for nausea or vomiting. (Patient not taking: Reported on 08/16/2016) 12 tablet 0  . promethazine (PHENERGAN) 25 MG tablet Take 1 tablet (25 mg total) by mouth every  6 (six) hours as needed for nausea or vomiting. (Patient not taking: Reported on 06/05/2016) 30 tablet 0   No current facility-administered medications on file prior to visit.     ALLERGIES: Allergies  Allergen Reactions  . Doxycycline Hyclate Hives, Itching and Swelling    REACTION: swelling of face  . Ketorolac Tromethamine Other (See Comments)    Patient stated that it makes her extremely hyper  .  Amoxicillin Hives, Swelling and Rash    Has patient had a PCN reaction causing immediate rash, facial/tongue/throat swelling, SOB or lightheadedness with hypotension: Unsure Has patient had a PCN reaction causing severe rash involving mucus membranes or skin necrosis: Unsure Has patient had a PCN reaction that required hospitalization No Has patient had a PCN reaction occurring within the last 10 years: No If all of the above answers are "NO", then may proceed with Cephalosporin use.REACTION: facial swelling and rash.    FAMILY HISTORY: Family History  Problem Relation Age of Onset  . Diabetes Mother   . Hypertension Mother   . Depression Mother   . Depression Father   . Heart disease Maternal Grandmother   . Breast cancer Maternal Aunt        [employer]  . Diabetes Maternal Grandfather   . Heart disease Maternal Grandfather   . Hypertension Maternal Grandfather   . Anesthesia problems Neg Hx     SOCIAL HISTORY: Social History   Social History  . Marital status: Single    Spouse name: N/A  . Number of children: 1  . Years of education: 12+   Occupational History  . Not on file.   Social History Main Topics  . Smoking status: Never Smoker  . Smokeless tobacco: Never Used  . Alcohol use No  . Drug use: No  . Sexual activity: Yes    Partners: Male    Birth control/ protection: None     Comment: last intercourse last night   Other Topics Concern  . Not on file   Social History Narrative   Regular exercise-no   Caffeine Use-yes    REVIEW OF SYSTEMS: Constitutional: No fevers, chills, or sweats, no generalized fatigue, change in appetite Eyes: No visual changes, double vision, eye pain Ear, nose and throat: No hearing loss, ear pain, nasal congestion, sore throat Cardiovascular: No chest pain, palpitations Respiratory:  No shortness of breath at rest or with exertion, wheezes GastrointestinaI: No nausea, vomiting, diarrhea, abdominal pain, fecal  incontinence Genitourinary:  No dysuria, urinary retention or frequency Musculoskeletal:  No neck pain, back pain Integumentary: No rash, pruritus, skin lesions Neurological: as above Psychiatric: No depression, insomnia, anxiety Endocrine: No palpitations, fatigue, diaphoresis, mood swings, change in appetite, change in weight, increased thirst Hematologic/Lymphatic:  No purpura, petechiae. Allergic/Immunologic: no itchy/runny eyes, nasal congestion, recent allergic reactions, rashes  PHYSICAL EXAM: Vitals:   11/15/16 0742  BP: 104/62  Pulse: 84  SpO2: 98%   General: No acute distress.  Patient appears well-groomed.  Morbidly obese body habitus. Head:  Normocephalic/atraumatic Eyes:  Fundi examined but not visualized Neck: supple, no paraspinal tenderness, full range of motion Heart:  Regular rate and rhythm Lungs:  Clear to auscultation bilaterally Back: No paraspinal tenderness Neurological Exam: alert and oriented to person, place, and time. Attention span and concentration intact, recent and remote memory intact, fund of knowledge intact.  Speech fluent and not dysarthric, language intact.  CN II-XII intact. Bulk and tone normal, muscle strength 5/5 throughout.  Sensation to light touch  intact.  Deep tendon reflexes 2+ throughout.  Finger to nose testing intact.  Gait normal, Romberg negative.  IMPRESSION: Chronic migraine Morbid obesity (BMI 48.01)  PLAN: 1.  She has had daily headaches for years and has failed multiple preventatives (topiramate, nortriptyline, sertraline).  Due to asthma, beta blockers are not ideal.  She meets criteria for Botox. 2.  For abortive therapy, will try sumatriptan 20mg  NS 3.  Lifestyle modification:  Diet/weight loss, exercise, hydration 4.  Follow up for Botox  Metta Clines, DO

## 2016-11-15 NOTE — Patient Instructions (Signed)
1.  At earliest onset of migraine, spray sumatriptan once in one nostril.  May repeat once in 2 hours if needed.  Do not exceed 2 sprays in 24 hours. 2.  Limit use of pain relievers to no more than days out of week to prevent rebound headache 3.  We will set you up for Botox.

## 2016-12-08 ENCOUNTER — Inpatient Hospital Stay (HOSPITAL_COMMUNITY): Payer: Medicaid Other

## 2016-12-08 ENCOUNTER — Inpatient Hospital Stay (HOSPITAL_COMMUNITY)
Admission: AD | Admit: 2016-12-08 | Discharge: 2016-12-08 | Disposition: A | Discharge status: Home / Self Care | Payer: Medicaid Other | Source: Ambulatory Visit | Attending: Obstetrics and Gynecology | Admitting: Obstetrics and Gynecology

## 2016-12-08 ENCOUNTER — Encounter (HOSPITAL_COMMUNITY): Payer: Self-pay | Admitting: *Deleted

## 2016-12-08 DIAGNOSIS — O99341 Other mental disorders complicating pregnancy, first trimester: Secondary | ICD-10-CM | POA: Diagnosis not present

## 2016-12-08 DIAGNOSIS — Z833 Family history of diabetes mellitus: Secondary | ICD-10-CM | POA: Diagnosis not present

## 2016-12-08 DIAGNOSIS — K219 Gastro-esophageal reflux disease without esophagitis: Secondary | ICD-10-CM | POA: Diagnosis not present

## 2016-12-08 DIAGNOSIS — Z803 Family history of malignant neoplasm of breast: Secondary | ICD-10-CM | POA: Diagnosis not present

## 2016-12-08 DIAGNOSIS — F419 Anxiety disorder, unspecified: Secondary | ICD-10-CM | POA: Diagnosis not present

## 2016-12-08 DIAGNOSIS — B9689 Other specified bacterial agents as the cause of diseases classified elsewhere: Secondary | ICD-10-CM | POA: Diagnosis not present

## 2016-12-08 DIAGNOSIS — O99511 Diseases of the respiratory system complicating pregnancy, first trimester: Secondary | ICD-10-CM | POA: Diagnosis not present

## 2016-12-08 DIAGNOSIS — F329 Major depressive disorder, single episode, unspecified: Secondary | ICD-10-CM | POA: Insufficient documentation

## 2016-12-08 DIAGNOSIS — O3680X0 Pregnancy with inconclusive fetal viability, not applicable or unspecified: Secondary | ICD-10-CM | POA: Diagnosis not present

## 2016-12-08 DIAGNOSIS — Z881 Allergy status to other antibiotic agents status: Secondary | ICD-10-CM | POA: Insufficient documentation

## 2016-12-08 DIAGNOSIS — Z79899 Other long term (current) drug therapy: Secondary | ICD-10-CM | POA: Diagnosis not present

## 2016-12-08 DIAGNOSIS — O23591 Infection of other part of genital tract in pregnancy, first trimester: Secondary | ICD-10-CM | POA: Diagnosis not present

## 2016-12-08 DIAGNOSIS — O26891 Other specified pregnancy related conditions, first trimester: Secondary | ICD-10-CM | POA: Diagnosis not present

## 2016-12-08 DIAGNOSIS — Z880 Allergy status to penicillin: Secondary | ICD-10-CM | POA: Insufficient documentation

## 2016-12-08 DIAGNOSIS — O34219 Maternal care for unspecified type scar from previous cesarean delivery: Secondary | ICD-10-CM | POA: Diagnosis not present

## 2016-12-08 DIAGNOSIS — Z3A01 Less than 8 weeks gestation of pregnancy: Secondary | ICD-10-CM | POA: Diagnosis not present

## 2016-12-08 DIAGNOSIS — N760 Acute vaginitis: Secondary | ICD-10-CM

## 2016-12-08 DIAGNOSIS — J45909 Unspecified asthma, uncomplicated: Secondary | ICD-10-CM | POA: Insufficient documentation

## 2016-12-08 DIAGNOSIS — Z9889 Other specified postprocedural states: Secondary | ICD-10-CM | POA: Diagnosis not present

## 2016-12-08 DIAGNOSIS — Z888 Allergy status to other drugs, medicaments and biological substances status: Secondary | ICD-10-CM | POA: Diagnosis not present

## 2016-12-08 DIAGNOSIS — R102 Pelvic and perineal pain: Secondary | ICD-10-CM

## 2016-12-08 DIAGNOSIS — O99611 Diseases of the digestive system complicating pregnancy, first trimester: Secondary | ICD-10-CM | POA: Diagnosis not present

## 2016-12-08 DIAGNOSIS — Z8249 Family history of ischemic heart disease and other diseases of the circulatory system: Secondary | ICD-10-CM | POA: Diagnosis not present

## 2016-12-08 DIAGNOSIS — O9989 Other specified diseases and conditions complicating pregnancy, childbirth and the puerperium: Secondary | ICD-10-CM | POA: Diagnosis not present

## 2016-12-08 LAB — CBC
HCT: 38.3 % (ref 36.0–46.0)
Hemoglobin: 12.5 g/dL (ref 12.0–15.0)
MCH: 26.7 pg (ref 26.0–34.0)
MCHC: 32.6 g/dL (ref 30.0–36.0)
MCV: 81.7 fL (ref 78.0–100.0)
PLATELETS: 174 10*3/uL (ref 150–400)
RBC: 4.69 MIL/uL (ref 3.87–5.11)
RDW: 14.7 % (ref 11.5–15.5)
WBC: 7.5 10*3/uL (ref 4.0–10.5)

## 2016-12-08 LAB — WET PREP, GENITAL
SPERM: NONE SEEN
TRICH WET PREP: NONE SEEN
Yeast Wet Prep HPF POC: NONE SEEN

## 2016-12-08 LAB — POCT PREGNANCY, URINE: Preg Test, Ur: POSITIVE — AB

## 2016-12-08 LAB — ABO/RH: ABO/RH(D): O POS

## 2016-12-08 LAB — HCG, QUANTITATIVE, PREGNANCY: hCG, Beta Chain, Quant, S: 373 m[IU]/mL — ABNORMAL HIGH (ref <5)

## 2016-12-08 MED ORDER — METRONIDAZOLE 0.75 % VA GEL: 1.0000 | Freq: Every day | VAGINAL | 0 refills | Status: AC

## 2016-12-08 NOTE — MAU Note (Signed)
Pt states that she has been having some pelvic discomfort for the past 2 weeks. She came in today due to the discomfort and absence of menses. "I can tell I was feeling different" states pt. Denies vag discharge at the moment.

## 2016-12-08 NOTE — MAU Provider Note (Signed)
History     CSN: [id number]  Arrival date and time: 12/08/16 1022   First Provider Initiated Contact with Patient 12/08/16 1313      Chief Complaint  Patient presents with  . Possible Pregnancy  . Pelvic Pain   HPI  Amanda [name] is a 36 y.o. G4P1011 at [redacted]w[redacted]d gestation presenting to MAU with complaints of pelvic pressure and cramping x 2 wks. She states she "feels like her period is about to start". She came today to be evaluated because she "felt different" and absence of menses.  Past Medical History:  Diagnosis Date  . Anginal pain ([place])   . Anxiety   . Asthma   . Constipation   . Depression   . Dysrhythmia    Palpatations  . Genital warts   . GERD (gastroesophageal reflux disease)   . H/O hiatal hernia   . Headache(784.0)   . Heart murmur   . MRSA cellulitis     Past Surgical History:  Procedure Laterality Date  . CERVICAL CONE BIOPSY    . CESAREAN SECTION  01/20/2012   Procedure: CESAREAN SECTION;  Surgeon: Melina Schools, MD;  Location: [facility];  Service: Obstetrics;  Laterality: N/A;  . COLONOSCOPY    . HERNIA REPAIR    . INSERTION OF MESH N/A 05/15/2015   Procedure: INSERTION OF MESH;  Surgeon: Ralene Ok, MD;  Location: [facility];  Service: General;  Laterality: N/A;  . MARSUPIALIZATION URETHRAL DIVERTICULUM    . UMBILICAL HERNIA REPAIR N/A 05/15/2015   Procedure: LAPAROSCOPIC UMBILICAL HERNIA;  Surgeon: Ralene Ok, MD;  Location: [facility];  Service: General;  Laterality: N/A;  . UPPER GI ENDOSCOPY      Family History  Problem Relation Age of Onset  . Diabetes Mother   . Hypertension Mother   . Depression Mother   . Depression Father   . Heart disease Maternal Grandmother   . Breast cancer Maternal Aunt        [employer]  . Cancer Maternal Aunt   . Diabetes Maternal Grandfather   . Heart disease Maternal Grandfather   . Hypertension Maternal Grandfather   . Anesthesia problems Neg Hx     Social History   Tobacco Use  .  Smoking status: Never Smoker  . Smokeless tobacco: Never Used  Substance Use Topics  . Alcohol use: No    Alcohol/week: 0.0 oz  . Drug use: No    Allergies:  Allergies  Allergen Reactions  . Doxycycline Hyclate Hives, Itching and Swelling    REACTION: swelling of face  . Ketorolac Tromethamine Other (See Comments)    Patient stated that it makes her extremely hyper  . Amoxicillin Hives, Swelling and Rash    Has patient had a PCN reaction causing immediate rash, facial/tongue/throat swelling, SOB or lightheadedness with hypotension: Unsure Has patient had a PCN reaction causing severe rash involving mucus membranes or skin necrosis: Unsure Has patient had a PCN reaction that required hospitalization No Has patient had a PCN reaction occurring within the last 10 years: No If all of the above answers are "NO", then may proceed with Cephalosporin use.REACTION: facial swelling and rash.    Medications Prior to Admission  Medication Sig Dispense Refill Last Dose  . Acetaminophen-Caff-Pyrilamine (MIDOL COMPLETE PO) Take 2 tablets daily as needed by mouth (for headache/pain).   Past Week at Unknown time  . cyclobenzaprine (FLEXERIL) 10 MG tablet May use every 8 hours for headache 60 tablet 4 Past Week at  Unknown time  . diphenhydrAMINE (BENADRYL) 25 MG tablet Take 25 mg at bedtime as needed by mouth for sleep.   12/07/2016 at Unknown time  . hydrOXYzine (ATARAX/VISTARIL) 10 MG tablet Take 20 mg 3 (three) times daily as needed by mouth for anxiety.   Past Week at Unknown time  . acetaminophen (TYLENOL) 500 MG tablet Take 1 tablet (500 mg total) by mouth every 6 (six) hours as needed for mild pain or moderate pain. 30 tablet 0 prn  . albuterol (PROVENTIL HFA;VENTOLIN HFA) 108 (90 Base) MCG/ACT inhaler Inhale 2 puffs into the lungs every 4 (four) hours as needed for wheezing or shortness of breath. (Patient not taking: Reported on 06/05/2016) 1 Inhaler 0 rescue  . loratadine (CLARITIN) 10 MG  tablet Take 1 tablet (10 mg total) by mouth daily. (Patient not taking: Reported on 08/16/2016) 30 tablet 0 Not Taking at Unknown time  . LORazepam (ATIVAN) 1 MG tablet Take 1 tablet (1 mg total) by mouth 3 (three) times daily as needed for anxiety. (Patient not taking: Reported on 08/08/2016) 6 tablet 0 Not Taking at Unknown time  . metroNIDAZOLE (METROGEL VAGINAL) 0.75 % vaginal gel Place 1 Applicatorful vaginally 2 (two) times daily. (Patient not taking: Reported on 05/09/2016) 70 g 0 Completed Course at Unknown time  . omeprazole (PRILOSEC) 20 MG capsule Take 1 capsule (20 mg total) by mouth 2 (two) times daily before a meal. (Patient not taking: Reported on 08/08/2016) 28 capsule 0 Not Taking at Unknown time  . ondansetron (ZOFRAN ODT) 4 MG disintegrating tablet Take 1 tablet (4 mg total) by mouth every 8 (eight) hours as needed for nausea or vomiting. (Patient not taking: Reported on 08/16/2016) 12 tablet 0 Not Taking at Unknown time  . promethazine (PHENERGAN) 25 MG tablet Take 1 tablet (25 mg total) by mouth every 6 (six) hours as needed for nausea or vomiting. (Patient not taking: Reported on 06/05/2016) 30 tablet 0 Not Taking at Unknown time  . SUMAtriptan ([name]) 20 MG/ACT nasal spray 1 spray in nostril.  May repeat x1 in 2 hours if headache persists or recurs. (Patient taking differently: Place 20 mg every 2 (two) hours as needed into the nose for migraine. ) 10 Inhaler 3 2.5 weeks    Review of Systems  Constitutional: Negative.   HENT: Negative.   Eyes: Negative.   Respiratory: Negative.   Cardiovascular: Negative.   Gastrointestinal: Negative.   Endocrine: Negative.   Genitourinary: Positive for pelvic pain.  Musculoskeletal: Negative.   Skin: Negative.   Allergic/Immunologic: Negative.   Neurological: Negative.   Hematological: Negative.   Psychiatric/Behavioral: Negative.    Physical Exam   Blood pressure 128/77, pulse 75, temperature 98.5 F (36.9 C), temperature source Oral,  resp. rate 16, height 5\' 4"  (1.626 m), weight 271 lb (122.9 kg), last menstrual period 11/03/2016, SpO2 100 %.  Physical Exam  Nursing note and vitals reviewed. Constitutional: She is oriented to person, place, and time. She appears well-developed and well-nourished.  Eyes: Pupils are equal, round, and reactive to light.  Neck: Normal range of motion.  Cardiovascular: Normal rate, regular rhythm and normal heart sounds.  Respiratory: Effort normal and breath sounds normal.  GI: Soft.  Genitourinary:  Genitourinary Comments: Uterus: non-tender, cx: smooth, pink, no lesions, smal amt of thick, white vaginal d/c, closed/long/firm, no CMT or friability, no adnexal tenderness   Musculoskeletal: Normal range of motion.  Neurological: She is alert and oriented to person, place, and time.  Skin: Skin is warm  and dry.  Psychiatric: She has a normal mood and affect. Her behavior is normal. Judgment and thought content normal.    MAU Course  Procedures  MDM CCUA UPT CBC HCG Wet Prep GC/CT HIV OB U/S < 14 wks  OB Transvaginal U/S  Results for orders placed or performed during the hospital encounter of 12/08/16 (from the past 24 hour(s))  Pregnancy, urine POC     Status: Abnormal   Collection Time: 12/08/16 10:43 AM  Result Value Ref Range   Preg Test, Ur POSITIVE (A) NEGATIVE  Wet prep, genital     Status: Abnormal   Collection Time: 12/08/16 12:31 PM  Result Value Ref Range   Yeast Wet Prep HPF POC NONE SEEN NONE SEEN   Trich, Wet Prep NONE SEEN NONE SEEN   Clue Cells Wet Prep HPF POC PRESENT (A) NONE SEEN   WBC, Wet Prep HPF POC MODERATE (A) NONE SEEN   Sperm NONE SEEN   CBC     Status: None   Collection Time: 12/08/16 12:37 PM  Result Value Ref Range   WBC 7.5 4.0 - 10.5 K/uL   RBC 4.69 3.87 - 5.11 MIL/uL   Hemoglobin 12.5 12.0 - 15.0 g/dL   HCT 38.3 36.0 - 46.0 %   MCV 81.7 78.0 - 100.0 fL   MCH 26.7 26.0 - 34.0 pg   MCHC 32.6 30.0 - 36.0 g/dL   RDW 14.7 11.5 - 15.5 %    Platelets 174 150 - 400 K/uL  ABO/Rh     Status: None   Collection Time: 12/08/16 12:41 PM  Result Value Ref Range   ABO/RH(D) O POS   hCG, quantitative, pregnancy     Status: Abnormal   Collection Time: 12/08/16 12:42 PM  Result Value Ref Range   hCG, Beta Chain, Quant, S 373 (H) <5 mIU/mL    US Ob Comp Less 14 Wks  Result Date: 12/08/2016 CLINICAL DATA:  Pelvic pain.  Five weeks, 0 days by LMP. EXAM: OBSTETRIC <14 WK [place] AND TRANSVAGINAL OB US TECHNIQUE: Both transabdominal and transvaginal ultrasound examinations were performed for complete evaluation of the gestation as well as the maternal uterus, adnexal regions, and pelvic cul-de-sac. Transvaginal technique was performed to assess early pregnancy. COMPARISON:  Pelvic ultrasound dated March 14, 2016. FINDINGS: Intrauterine gestational sac: None Yolk sac:  Not Visualized. Embryo:  Not Visualized. Cardiac Activity: Not Visualized. Subchorionic hemorrhage:  None visualized. Maternal uterus/adnexae: Normal. IMPRESSION: 1. No intrauterine pregnancy identified. Findings may represent early pregnancy, recent failed pregnancy, or ectopic pregnancy. Recommend follow-up quantitative B-HCG levels and follow-up US in 14 days to assess viability. This recommendation follows SRU consensus guidelines: Diagnostic Criteria for Nonviable Pregnancy Early in the First Trimester. [place] Med 2013; 315:4008-67. Electronically Signed   By: Titus Dubin M.D.   On: 12/08/2016 14:10   US Ob Transvaginal  Result Date: 12/08/2016 CLINICAL DATA:  Pelvic pain.  Five weeks, 0 days by LMP. EXAM: OBSTETRIC <14 WK [place] AND TRANSVAGINAL OB US TECHNIQUE: Both transabdominal and transvaginal ultrasound examinations were performed for complete evaluation of the gestation as well as the maternal uterus, adnexal regions, and pelvic cul-de-sac. Transvaginal technique was performed to assess early pregnancy. COMPARISON:  Pelvic ultrasound dated March 14, 2016. FINDINGS:  Intrauterine gestational sac: None Yolk sac:  Not Visualized. Embryo:  Not Visualized. Cardiac Activity: Not Visualized. Subchorionic hemorrhage:  None visualized. Maternal uterus/adnexae: Normal. IMPRESSION: 1. No intrauterine pregnancy identified. Findings may represent early pregnancy, recent failed pregnancy, or  ectopic pregnancy. Recommend follow-up quantitative B-HCG levels and follow-up US in 14 days to assess viability. This recommendation follows SRU consensus guidelines: Diagnostic Criteria for Nonviable Pregnancy Early in the First Trimester. [employer] Med 2013; 007:6226-33. Electronically Signed   By: Titus Dubin M.D.   On: 12/08/2016 14:10    Assessment and Plan  Bacterial vaginitis - Rx for Metrogel 0.75% per vagina hs x 5 days - Information provided on BV & Metrogel   Pregnancy of unknown anatomic location - Repeat HCG on 11/17 after 1 pm - Return to MAU for:  If you have heavier bleeding that soaks through more that 2 pads per hour for an hour or more  If you bleed so much that you feel like you might pass out or you do pass out  If you have significant abdominal pain that is not improved with Tylenol   If you develop a fever > 100.5  Discharge home Patient verbalized an understanding of the plan of care and agrees.   Laury Deep, MSN, CNM 12/08/2016, 1:30 PM

## 2016-12-08 NOTE — MAU Note (Signed)
Pt reports pressure and discomfort in her pelvis. LMP 10/11, states it feels like she is going to start her period but she doesn't

## 2016-12-08 NOTE — MAU Note (Signed)
Urine in lab. Culture obtained.

## 2016-12-08 NOTE — Discharge Instructions (Signed)
Return to care  °· If you have heavier bleeding that soaks through more that 2 pads per hour for an hour or more °· If you bleed so much that you feel like you might pass out or you do pass out °· If you have significant abdominal pain that is not improved with Tylenol  °· If you develop a fever > 100.5 ° °

## 2016-12-09 LAB — GC/CHLAMYDIA PROBE AMP (~~LOC~~) NOT AT ARMC
CHLAMYDIA, DNA PROBE: NEGATIVE
NEISSERIA GONORRHEA: NEGATIVE

## 2016-12-09 LAB — HIV ANTIBODY (ROUTINE TESTING W REFLEX): HIV SCREEN 4TH GENERATION: NONREACTIVE

## 2016-12-10 ENCOUNTER — Other Ambulatory Visit: Payer: Self-pay

## 2016-12-10 ENCOUNTER — Inpatient Hospital Stay (HOSPITAL_COMMUNITY)
Admission: AD | Admit: 2016-12-10 | Discharge: 2016-12-10 | Disposition: A | Discharge status: Home / Self Care | Payer: Medicaid Other | Source: Ambulatory Visit | Attending: Obstetrics and Gynecology | Admitting: Obstetrics and Gynecology

## 2016-12-10 ENCOUNTER — Encounter (HOSPITAL_COMMUNITY): Payer: Self-pay | Admitting: Student

## 2016-12-10 DIAGNOSIS — O3680X0 Pregnancy with inconclusive fetal viability, not applicable or unspecified: Secondary | ICD-10-CM | POA: Diagnosis not present

## 2016-12-10 DIAGNOSIS — Z3481 Encounter for supervision of other normal pregnancy, first trimester: Secondary | ICD-10-CM | POA: Insufficient documentation

## 2016-12-10 DIAGNOSIS — Z3A01 Less than 8 weeks gestation of pregnancy: Secondary | ICD-10-CM | POA: Insufficient documentation

## 2016-12-10 LAB — HCG, QUANTITATIVE, PREGNANCY: HCG, BETA CHAIN, QUANT, S: 959 m[IU]/mL — AB (ref <5)

## 2016-12-10 NOTE — MAU Note (Signed)
Here for repeat blood work.  Doing ok. No bleeding, little cramping.

## 2016-12-10 NOTE — MAU Provider Note (Signed)
   Subjective   [name]  is a 36 y.o. G3P1011 at [redacted]w[redacted]d who presents to MAU today for follow-up quant hCG after 48 hours.   She was seen in MAU with pregnancy of unknown location  Current symptoms include: none  Objective  Physical Examination: General appearance - alert, well appearing, and in no distress and oriented to person, place, and time   Lab Results  Component Value Date   HCGBETAQNT 959 (H) 12/10/2016   HCGBETAQNT 373 (H) 12/08/2016    US Ob Comp Less 14 Wks  Result Date: 12/08/2016 CLINICAL DATA:  Pelvic pain.  Five weeks, 0 days by LMP. EXAM: OBSTETRIC <14 WK [place] AND TRANSVAGINAL OB US TECHNIQUE: Both transabdominal and transvaginal ultrasound examinations were performed for complete evaluation of the gestation as well as the maternal uterus, adnexal regions, and pelvic cul-de-sac. Transvaginal technique was performed to assess early pregnancy. COMPARISON:  Pelvic ultrasound dated March 14, 2016. FINDINGS: Intrauterine gestational sac: None Yolk sac:  Not Visualized. Embryo:  Not Visualized. Cardiac Activity: Not Visualized. Subchorionic hemorrhage:  None visualized. Maternal uterus/adnexae: Normal. IMPRESSION: 1. No intrauterine pregnancy identified. Findings may represent early pregnancy, recent failed pregnancy, or ectopic pregnancy. Recommend follow-up quantitative B-HCG levels and follow-up US in 14 days to assess viability. This recommendation follows SRU consensus guidelines: Diagnostic Criteria for Nonviable Pregnancy Early in the First Trimester. [employer] Med 2013; 628:3151-76. Electronically Signed   By: Amanda [name] M.D.   On: 12/08/2016 14:10   US Ob Transvaginal  Result Date: 12/08/2016 CLINICAL DATA:  Pelvic pain.  Five weeks, 0 days by LMP. EXAM: OBSTETRIC <14 WK [place] AND TRANSVAGINAL OB US TECHNIQUE: Both transabdominal and transvaginal ultrasound examinations were performed for complete evaluation of the gestation as well as the maternal  uterus, adnexal regions, and pelvic cul-de-sac. Transvaginal technique was performed to assess early pregnancy. COMPARISON:  Pelvic ultrasound dated March 14, 2016. FINDINGS: Intrauterine gestational sac: None Yolk sac:  Not Visualized. Embryo:  Not Visualized. Cardiac Activity: Not Visualized. Subchorionic hemorrhage:  None visualized. Maternal uterus/adnexae: Normal. IMPRESSION: 1. No intrauterine pregnancy identified. Findings may represent early pregnancy, recent failed pregnancy, or ectopic pregnancy. Recommend follow-up quantitative B-HCG levels and follow-up US in 14 days to assess viability. This recommendation follows SRU consensus guidelines: Diagnostic Criteria for Nonviable Pregnancy Early in the First Trimester. [employer] Med 2013; 160:7371-06. Electronically Signed   By: Amanda [name] M.D.   On: 12/08/2016 14:10    Appropriate rise in BHCG & pt asymptomatic. Discussed with Dr. Melba Coon. Ok to discharge home. Pt knows to schedule prenatal care.   ASSESSMENT: 1. Pregnancy of unknown anatomic location    PLAN: Discharge home in stable condition Schedule prenatal appointment Discussed reasons to return to MAU  Amanda Guild, NP

## 2016-12-10 NOTE — Discharge Instructions (Signed)
First Trimester of Pregnancy The first trimester of pregnancy is from week 1 until the end of week 13 (months 1 through 3). During this time, your baby will begin to develop inside you. At 6-8 weeks, the eyes and face are formed, and the heartbeat can be seen on ultrasound. At the end of 12 weeks, all the baby's organs are formed. Prenatal care is all the medical care you receive before the birth of your baby. Make sure you get good prenatal care and follow all of your doctor's instructions. Follow these instructions at home: Medicines  Take over-the-counter and prescription medicines only as told by your doctor. Some medicines are safe and some medicines are not safe during pregnancy.  Take a prenatal vitamin that contains at least 600 micrograms (mcg) of folic acid.  If you have trouble pooping (constipation), take medicine that will make your stool soft (stool softener) if your doctor approves. Eating and drinking  Eat regular, healthy meals.  Your doctor will tell you the amount of weight gain that is right for you.  Avoid raw meat and uncooked cheese.  If you feel sick to your stomach (nauseous) or throw up (vomit): ? Eat 4 or 5 small meals a day instead of 3 large meals. ? Try eating a few soda crackers. ? Drink liquids between meals instead of during meals.  To prevent constipation: ? Eat foods that are high in fiber, like fresh fruits and vegetables, whole grains, and beans. ? Drink enough fluids to keep your pee (urine) clear or pale yellow. Activity  Exercise only as told by your doctor. Stop exercising if you have cramps or pain in your lower belly (abdomen) or low back.  Do not exercise if it is too hot, too humid, or if you are in a place of great height (high altitude).  Try to avoid standing for long periods of time. Move your legs often if you must stand in one place for a long time.  Avoid heavy lifting.  Wear low-heeled shoes. Sit and stand up straight.  You  can have sex unless your doctor tells you not to. Relieving pain and discomfort  Wear a good support bra if your breasts are sore.  Take warm water baths (sitz baths) to soothe pain or discomfort caused by hemorrhoids. Use hemorrhoid cream if your doctor says it is okay.  Rest with your legs raised if you have leg cramps or low back pain.  If you have puffy, bulging veins (varicose veins) in your legs: ? Wear support hose or compression stockings as told by your doctor. ? Raise (elevate) your feet for 15 minutes, 3-4 times a day. ? Limit salt in your food. Prenatal care  Schedule your prenatal visits by the twelfth week of pregnancy.  Write down your questions. Take them to your prenatal visits.  Keep all your prenatal visits as told by your doctor. This is important. Safety  Wear your seat belt at all times when driving.  Make a list of emergency phone numbers. The list should include numbers for family, friends, the hospital, and police and fire departments. General instructions  Ask your doctor for a referral to a local prenatal class. Begin classes no later than at the start of month 6 of your pregnancy.  Ask for help if you need counseling or if you need help with nutrition. Your doctor can give you advice or tell you where to go for help.  Do not use hot tubs, steam rooms, or   saunas.  Do not douche or use tampons or scented sanitary pads.  Do not cross your legs for long periods of time.  Avoid all herbs and alcohol. Avoid drugs that are not approved by your doctor.  Do not use any tobacco products, including cigarettes, chewing tobacco, and electronic cigarettes. If you need help quitting, ask your doctor. You may get counseling or other support to help you quit.  Avoid cat litter boxes and soil used by cats. These carry germs that can cause birth defects in the baby and can cause a loss of your baby (miscarriage) or stillbirth.  Visit your dentist. At home, brush  your teeth with a soft toothbrush. Be gentle when you floss. Contact a doctor if:  You are dizzy.  You have mild cramps or pressure in your lower belly.  You have a nagging pain in your belly area.  You continue to feel sick to your stomach, you throw up, or you have watery poop (diarrhea).  You have a bad smelling fluid coming from your vagina.  You have pain when you pee (urinate).  You have increased puffiness (swelling) in your face, hands, legs, or ankles. Get help right away if:  You have a fever.  You are leaking fluid from your vagina.  You have spotting or bleeding from your vagina.  You have very bad belly cramping or pain.  You gain or lose weight rapidly.  You throw up blood. It may look like coffee grounds.  You are around people who have German measles, fifth disease, or chickenpox.  You have a very bad headache.  You have shortness of breath.  You have any kind of trauma, such as from a fall or a car accident. Summary  The first trimester of pregnancy is from week 1 until the end of week 13 (months 1 through 3).  To take care of yourself and your unborn baby, you will need to eat healthy meals, take medicines only if your doctor tells you to do so, and do activities that are safe for you and your baby.  Keep all follow-up visits as told by your doctor. This is important as your doctor will have to ensure that your baby is healthy and growing well. This information is not intended to replace advice given to you by your health care provider. Make sure you discuss any questions you have with your health care provider. Document Released: 06/29/2007 Document Revised: 01/19/2016 Document Reviewed: 01/19/2016 Elsevier Interactive Patient Education  2017 [employer]  

## 2016-12-21 ENCOUNTER — Encounter (HOSPITAL_COMMUNITY): Payer: Self-pay | Admitting: *Deleted

## 2016-12-21 ENCOUNTER — Inpatient Hospital Stay (HOSPITAL_COMMUNITY): Payer: Medicaid Other

## 2016-12-21 ENCOUNTER — Inpatient Hospital Stay (HOSPITAL_COMMUNITY)
Admission: AD | Admit: 2016-12-21 | Discharge: 2016-12-21 | Disposition: A | Discharge status: Home / Self Care | Payer: Medicaid Other | Source: Ambulatory Visit | Attending: Obstetrics & Gynecology | Admitting: Obstetrics & Gynecology

## 2016-12-21 DIAGNOSIS — O26892 Other specified pregnancy related conditions, second trimester: Secondary | ICD-10-CM

## 2016-12-21 DIAGNOSIS — F419 Anxiety disorder, unspecified: Secondary | ICD-10-CM | POA: Diagnosis not present

## 2016-12-21 DIAGNOSIS — J45909 Unspecified asthma, uncomplicated: Secondary | ICD-10-CM | POA: Diagnosis not present

## 2016-12-21 DIAGNOSIS — Z880 Allergy status to penicillin: Secondary | ICD-10-CM | POA: Insufficient documentation

## 2016-12-21 DIAGNOSIS — O26899 Other specified pregnancy related conditions, unspecified trimester: Secondary | ICD-10-CM | POA: Insufficient documentation

## 2016-12-21 DIAGNOSIS — O210 Mild hyperemesis gravidarum: Secondary | ICD-10-CM | POA: Diagnosis not present

## 2016-12-21 DIAGNOSIS — O99340 Other mental disorders complicating pregnancy, unspecified trimester: Secondary | ICD-10-CM | POA: Insufficient documentation

## 2016-12-21 DIAGNOSIS — O99519 Diseases of the respiratory system complicating pregnancy, unspecified trimester: Secondary | ICD-10-CM | POA: Diagnosis not present

## 2016-12-21 DIAGNOSIS — R109 Unspecified abdominal pain: Secondary | ICD-10-CM | POA: Diagnosis present

## 2016-12-21 DIAGNOSIS — O3680X0 Pregnancy with inconclusive fetal viability, not applicable or unspecified: Secondary | ICD-10-CM

## 2016-12-21 DIAGNOSIS — D573 Sickle-cell trait: Secondary | ICD-10-CM | POA: Diagnosis not present

## 2016-12-21 DIAGNOSIS — F329 Major depressive disorder, single episode, unspecified: Secondary | ICD-10-CM | POA: Diagnosis not present

## 2016-12-21 DIAGNOSIS — Z3A01 Less than 8 weeks gestation of pregnancy: Secondary | ICD-10-CM | POA: Diagnosis not present

## 2016-12-21 HISTORY — DX: Sickle-cell trait: D57.3

## 2016-12-21 LAB — COMPREHENSIVE METABOLIC PANEL
ALBUMIN: 4.2 g/dL (ref 3.5–5.0)
ALT: 19 U/L (ref 14–54)
ANION GAP: 10 (ref 5–15)
AST: 19 U/L (ref 15–41)
Alkaline Phosphatase: 58 U/L (ref 38–126)
BILIRUBIN TOTAL: 0.8 mg/dL (ref 0.3–1.2)
BUN: 10 mg/dL (ref 6–20)
CHLORIDE: 100 mmol/L — AB (ref 101–111)
CO2: 24 mmol/L (ref 22–32)
Calcium: 9.3 mg/dL (ref 8.9–10.3)
Creatinine, Ser: 0.6 mg/dL (ref 0.44–1.00)
GFR calc Af Amer: >60 mL/min (ref >60)
Glucose, Bld: 125 mg/dL — ABNORMAL HIGH (ref 65–99)
POTASSIUM: 3.8 mmol/L (ref 3.5–5.1)
Sodium: 134 mmol/L — ABNORMAL LOW (ref 135–145)
TOTAL PROTEIN: 7.9 g/dL (ref 6.5–8.1)

## 2016-12-21 LAB — URINALYSIS, ROUTINE W REFLEX MICROSCOPIC
Bilirubin Urine: NEGATIVE
Glucose, UA: NEGATIVE mg/dL
Hgb urine dipstick: NEGATIVE
Ketones, ur: 80 mg/dL — AB
Leukocytes, UA: NEGATIVE
Nitrite: NEGATIVE
Protein, ur: 30 mg/dL — AB
SPECIFIC GRAVITY, URINE: 1.014 (ref 1.005–1.030)
pH: 5 (ref 5.0–8.0)

## 2016-12-21 MED ORDER — PROMETHAZINE HCL 25 MG/ML IJ SOLN
25.0000 mg | Freq: Once | INTRAMUSCULAR | Status: AC
  Administered 2016-12-21: 25 mg via INTRAVENOUS
  Filled 2016-12-21: qty 1

## 2016-12-21 MED ORDER — SCOPOLAMINE 1 MG/3DAYS TD PT72
1.0000 | MEDICATED_PATCH | TRANSDERMAL | Status: DC
  Administered 2016-12-21: 1.5 mg via TRANSDERMAL
  Filled 2016-12-21: qty 1

## 2016-12-21 MED ORDER — M.V.I. ADULT IV INJ
Freq: Once | INTRAVENOUS | Status: AC
  Administered 2016-12-21: 16:00:00 via INTRAVENOUS
  Filled 2016-12-21: qty 1000

## 2016-12-21 MED ORDER — RANITIDINE HCL 150 MG PO CAPS: 150.0000 mg | ORAL_CAPSULE | Freq: Two times a day (BID) | ORAL | 1 refills | Status: DC

## 2016-12-21 MED ORDER — SCOPOLAMINE 1 MG/3DAYS TD PT72: 1.0000 | MEDICATED_PATCH | TRANSDERMAL | 12 refills | Status: DC

## 2016-12-21 MED ORDER — ONDANSETRON 8 MG PO TBDP: 8.0000 mg | ORAL_TABLET | Freq: Three times a day (TID) | ORAL | 0 refills | Status: DC | PRN

## 2016-12-21 MED ORDER — DEXTROSE 5 % IN LACTATED RINGERS IV BOLUS
1000.0000 mL | Freq: Once | INTRAVENOUS | Status: AC
  Administered 2016-12-21: 1000 mL via INTRAVENOUS

## 2016-12-21 MED ORDER — SODIUM CHLORIDE 0.9 % IV SOLN
8.0000 mg | Freq: Once | INTRAVENOUS | Status: AC
  Administered 2016-12-21: 8 mg via INTRAVENOUS
  Filled 2016-12-21: qty 4

## 2016-12-21 NOTE — MAU Note (Signed)
Pt reports for the last 4-5 days she has felt so bad she just doesn't want to go on. States she can't eat, she can't sleep, she can't even drink water. States she is hurting in her chest she thinks due to anxiety.

## 2016-12-21 NOTE — Discharge Instructions (Signed)

## 2016-12-21 NOTE — MAU Provider Note (Signed)
History     CSN: [id number]  Arrival date and time: 12/21/16 1139   First Provider Initiated Contact with Patient 12/21/16 1308      Chief Complaint  Patient presents with  . Emesis  . Abdominal Pain   HPI   Amanda [name] is a 36 y.o. female [phone] @ 27w6dhere in MAU with complaints of nausea and vomiting. States she has vomited more than 50 times in the last 24 hours. States she was taking phenergan and zofran which does not seem to work consistency.  States she has occasional lower abdominal cramping that comes and goes. She has tried tylenol which has only helped some.  States she had appropriate rise in quant levels.   OB History    Gravida Para Term Preterm AB Living   '4 1 1 ' 0 2 1   SAB TAB Ectopic Multiple Live Births   2 0 0 0 1      Past Medical History:  Diagnosis Date  . [facility] [facility]   . Anxiety   . Asthma   . Constipation   . Depression   . Dysrhythmia    Palpatations  . Genital warts   . GERD (gastroesophageal reflux disease)   . H/O hiatal hernia   . Headache(784.0)   . Heart murmur   . MRSA cellulitis   . Sickle cell trait ([facility]     Past Surgical History:  Procedure Laterality Date  . CERVICAL CONE BIOPSY    . CESAREAN SECTION  01/20/2012   Procedure: CESAREAN SECTION;  Surgeon: TMelina Schools MD;  [facility];  Service: Obstetrics;  Laterality: N/A;  . COLONOSCOPY    . HERNIA REPAIR    . INSERTION OF MESH N/A 05/15/2015   Procedure: INSERTION OF MESH;  Surgeon: ARalene Ok MD;  [facility];  Service: General;  Laterality: N/A;  . MARSUPIALIZATION URETHRAL DIVERTICULUM    . UMBILICAL HERNIA REPAIR N/A 05/15/2015   Procedure: LAPAROSCOPIC UMBILICAL HERNIA;  Surgeon: ARalene Ok MD;  Location: [facility];  Service: General;  Laterality: N/A;  . UPPER GI ENDOSCOPY      Family History  Problem Relation Age of Onset  . Diabetes Mother   . Hypertension Mother   . Depression Mother   . Depression Father   .  Heart disease Maternal Grandmother   . Breast cancer Maternal Aunt        [employer] . Cancer Maternal Aunt   . Diabetes Maternal Grandfather   . Heart disease Maternal Grandfather   . Hypertension Maternal Grandfather   . Anesthesia problems Neg Hx     Social History   Tobacco Use  . Smoking status: Never Smoker  . Smokeless tobacco: Never Used  Substance Use Topics  . Alcohol use: No    Alcohol/week: 0.0 oz  . Drug use: No    Allergies:  Allergies  Allergen Reactions  . Doxycycline Hyclate Hives, Itching and Swelling    REACTION: swelling of face  . Ketorolac Tromethamine Other (See Comments)    Patient stated that it makes her extremely hyper  . Amoxicillin Hives, Swelling and Rash    Has patient had a PCN reaction causing immediate rash, facial/tongue/throat swelling, SOB or lightheadedness with hypotension: Unsure Has patient had a PCN reaction causing severe rash involving mucus membranes or skin necrosis: Unsure Has patient had a PCN reaction that required hospitalization No Has patient had a PCN reaction occurring within the last 10 years: No If  all of the above answers are "NO", then may proceed with Cephalosporin use.REACTION: facial swelling and rash.    Medications Prior to Admission  Medication Sig Dispense Refill Last Dose  . acetaminophen (TYLENOL) 500 MG tablet Take 1 tablet (500 mg total) by mouth every 6 (six) hours as needed for mild pain or moderate pain. 30 tablet 0 12/20/2016 at Unknown time  . cyclobenzaprine (FLEXERIL) 10 MG tablet May use every 8 hours for headache (Patient not taking: Reported on 12/21/2016) 60 tablet 4 Not Taking at Unknown time    Results for orders placed or performed during the hospital encounter of 12/21/16 (from the past 48 hour(s))  Urinalysis, Routine w reflex microscopic     Status: Abnormal   Collection Time: 12/21/16 12:31 PM  Result Value Ref Range   Color, Urine YELLOW YELLOW   APPearance HAZY (A) CLEAR    Specific Gravity, Urine 1.014 1.005 - 1.030   pH 5.0 5.0 - 8.0   Glucose, UA NEGATIVE NEGATIVE mg/dL   Hgb urine dipstick NEGATIVE NEGATIVE   Bilirubin Urine NEGATIVE NEGATIVE   Ketones, ur 80 (A) NEGATIVE mg/dL   Protein, ur 30 (A) NEGATIVE mg/dL   Nitrite NEGATIVE NEGATIVE   Leukocytes, UA NEGATIVE NEGATIVE   RBC / HPF 0-5 0 - 5 RBC/hpf   WBC, UA 0-5 0 - 5 WBC/hpf   Bacteria, UA RARE (A) NONE SEEN   Squamous Epithelial / LPF 0-5 (A) NONE SEEN   Mucus PRESENT   Comprehensive metabolic panel     Status: Abnormal   Collection Time: 12/21/16  1:27 PM  Result Value Ref Range   Sodium 134 (L) 135 - 145 mmol/L   Potassium 3.8 3.5 - 5.1 mmol/L   Chloride 100 (L) 101 - 111 mmol/L   CO2 24 22 - 32 mmol/L   Glucose, Bld 125 (H) 65 - 99 mg/dL   BUN 10 6 - 20 mg/dL   Creatinine, Ser 0.60 0.44 - 1.00 mg/dL   Calcium 9.3 8.9 - 10.3 mg/dL   Total Protein 7.9 6.5 - 8.1 g/dL   Albumin 4.2 3.5 - 5.0 g/dL   AST 19 15 - 41 U/L   ALT 19 14 - 54 U/L   Alkaline Phosphatase 58 38 - 126 U/L   Total Bilirubin 0.8 0.3 - 1.2 mg/dL   GFR calc non Af Amer >60 >60 mL/min   GFR calc Af Amer >60 >60 mL/min    Comment: (NOTE) The eGFR has been calculated using the CKD EPI equation. This calculation has not been validated in all clinical situations. eGFR's persistently <60 mL/min signify possible Chronic Kidney Disease.    Anion gap 10 5 - 15   US Ob Transvaginal  Result Date: 12/21/2016 CLINICAL DATA:  Abdominal pain, not feeling well, pregnant ; quantitative beta HCG = 959 on 12/10/2016 ; EGA [redacted] weeks 6 days by LMP of 11/03/2016 EXAM: TRANSVAGINAL OB ULTRASOUND TECHNIQUE: Transvaginal ultrasound was performed for complete evaluation of the gestation as well as the maternal uterus, adnexal regions, and pelvic cul-de-sac. COMPARISON:  12/08/2016 FINDINGS: Intrauterine gestational sac: Present, single Yolk sac:  Questionably visualized Embryo:  Not identified Cardiac Activity: N/A Heart Rate: N/A bpm MSD:  17.4  mm   6 w   4  d Subchorionic hemorrhage:  None visualized. Maternal uterus/adnexae: Nabothian cysts at cervix. Uterus otherwise unremarkable. LEFT ovary normal size 3.7 x 3.8 x 1.0 cm, questionably containing a small corpus luteum. RIGHT ovary normal size and morphology, 1.8 x 3.3  x 1.8 cm. No adnexal masses or free pelvic fluid IMPRESSION: Gestational sac seen within uterus with questionable visualization of a yolk sac but no fetal pole identified to assess viability. Remainder of exam unremarkable. Electronically Signed   By: Lavonia Dana M.D.   On: 12/21/2016 15:34    Review of Systems  Constitutional: Negative for fever.  Gastrointestinal: Positive for abdominal pain.  Genitourinary: Negative for dysuria, vaginal bleeding and vaginal discharge.   Physical Exam   Blood pressure (!) 111/55, pulse 72, temperature 99 F (37.2 C), temperature source Oral, resp. rate 16, height '5\' 3"'  (1.6 m), weight 265 lb (120.2 kg), last menstrual period 11/03/2016, SpO2 100 %.  Physical Exam  Constitutional: She is oriented to person, place, and time. She appears well-developed and well-nourished. No distress.  HENT:  Head: Normocephalic.  Eyes: Pupils are equal, round, and reactive to light.  GI: Soft. She exhibits no distension. There is no tenderness.  Musculoskeletal: Normal range of motion.  Neurological: She is alert and oriented to person, place, and time.  Skin: Skin is warm. She is not diaphoretic.  Psychiatric: Her behavior is normal.    MAU Course  Procedures  None  MDM  CMP MVI X 1 D5LR bols Phenergan 25 mg IV X 1 Zofran 8 mg PO X 1 Scopolamine patch applied Patient tolerating PO fluids.  Appropriate rise in quants on 11/15 and 11/17  Assessment and Plan   A:  1. Hyperemesis gravidarum   2. Pregnancy of unknown anatomic location   3. Abdominal pain in pregnancy, antepartum     P:  Discharge home in stable condition, with strict return precautions Ectopic  precautions  Continue Zofran, 8 mg tablets Rx Rx: Zantac Continue phenergan Follow up US for viability in 1 week Small, frequent meals   Cyani Kallstrom, Artist Pais, NP 12/21/2016 8:18 PM

## 2016-12-27 ENCOUNTER — Encounter (HOSPITAL_COMMUNITY): Payer: Self-pay | Admitting: *Deleted

## 2016-12-27 ENCOUNTER — Inpatient Hospital Stay (HOSPITAL_COMMUNITY): Payer: Medicaid Other

## 2016-12-27 ENCOUNTER — Inpatient Hospital Stay (HOSPITAL_COMMUNITY)
Admission: AD | Admit: 2016-12-27 | Discharge: 2016-12-27 | Disposition: A | Discharge status: Home / Self Care | Payer: Medicaid Other | Source: Ambulatory Visit | Attending: Family Medicine | Admitting: Family Medicine

## 2016-12-27 DIAGNOSIS — J45909 Unspecified asthma, uncomplicated: Secondary | ICD-10-CM | POA: Insufficient documentation

## 2016-12-27 DIAGNOSIS — Z881 Allergy status to other antibiotic agents status: Secondary | ICD-10-CM | POA: Diagnosis not present

## 2016-12-27 DIAGNOSIS — O99511 Diseases of the respiratory system complicating pregnancy, first trimester: Secondary | ICD-10-CM | POA: Diagnosis not present

## 2016-12-27 DIAGNOSIS — Z833 Family history of diabetes mellitus: Secondary | ICD-10-CM | POA: Insufficient documentation

## 2016-12-27 DIAGNOSIS — K5900 Constipation, unspecified: Secondary | ICD-10-CM | POA: Diagnosis not present

## 2016-12-27 DIAGNOSIS — F419 Anxiety disorder, unspecified: Secondary | ICD-10-CM | POA: Diagnosis not present

## 2016-12-27 DIAGNOSIS — F329 Major depressive disorder, single episode, unspecified: Secondary | ICD-10-CM | POA: Insufficient documentation

## 2016-12-27 DIAGNOSIS — O219 Vomiting of pregnancy, unspecified: Secondary | ICD-10-CM | POA: Insufficient documentation

## 2016-12-27 DIAGNOSIS — O99611 Diseases of the digestive system complicating pregnancy, first trimester: Secondary | ICD-10-CM | POA: Diagnosis present

## 2016-12-27 DIAGNOSIS — O99341 Other mental disorders complicating pregnancy, first trimester: Secondary | ICD-10-CM | POA: Insufficient documentation

## 2016-12-27 DIAGNOSIS — Z9889 Other specified postprocedural states: Secondary | ICD-10-CM | POA: Insufficient documentation

## 2016-12-27 DIAGNOSIS — Z3A01 Less than 8 weeks gestation of pregnancy: Secondary | ICD-10-CM | POA: Insufficient documentation

## 2016-12-27 DIAGNOSIS — Z880 Allergy status to penicillin: Secondary | ICD-10-CM | POA: Insufficient documentation

## 2016-12-27 DIAGNOSIS — Z803 Family history of malignant neoplasm of breast: Secondary | ICD-10-CM | POA: Diagnosis not present

## 2016-12-27 DIAGNOSIS — K219 Gastro-esophageal reflux disease without esophagitis: Secondary | ICD-10-CM | POA: Insufficient documentation

## 2016-12-27 DIAGNOSIS — Z8249 Family history of ischemic heart disease and other diseases of the circulatory system: Secondary | ICD-10-CM | POA: Insufficient documentation

## 2016-12-27 DIAGNOSIS — D573 Sickle-cell trait: Secondary | ICD-10-CM | POA: Insufficient documentation

## 2016-12-27 DIAGNOSIS — Z888 Allergy status to other drugs, medicaments and biological substances status: Secondary | ICD-10-CM | POA: Insufficient documentation

## 2016-12-27 LAB — URINALYSIS, ROUTINE W REFLEX MICROSCOPIC
BILIRUBIN URINE: NEGATIVE
Glucose, UA: NEGATIVE mg/dL
HGB URINE DIPSTICK: NEGATIVE
Ketones, ur: NEGATIVE mg/dL
NITRITE: NEGATIVE
PROTEIN: 30 mg/dL — AB
Specific Gravity, Urine: 1.014 (ref 1.005–1.030)
pH: 6 (ref 5.0–8.0)

## 2016-12-27 MED ORDER — DIPHENHYDRAMINE HCL 50 MG/ML IJ SOLN
25.0000 mg | Freq: Once | INTRAMUSCULAR | Status: AC
  Administered 2016-12-27: 25 mg via INTRAVENOUS
  Filled 2016-12-27: qty 1

## 2016-12-27 MED ORDER — DEXAMETHASONE SODIUM PHOSPHATE 10 MG/ML IJ SOLN
10.0000 mg | Freq: Once | INTRAMUSCULAR | Status: AC
Start: 2016-12-27 — End: 2016-12-27
  Administered 2016-12-27: 10 mg via INTRAVENOUS
  Filled 2016-12-27: qty 1

## 2016-12-27 MED ORDER — PROMETHAZINE HCL 25 MG/ML IJ SOLN
25.0000 mg | Freq: Once | INTRAMUSCULAR | Status: AC
  Administered 2016-12-27: 25 mg via INTRAVENOUS
  Filled 2016-12-27: qty 1

## 2016-12-27 MED ORDER — PROMETHAZINE HCL 25 MG PO TABS: 12.5000 mg | ORAL_TABLET | Freq: Four times a day (QID) | ORAL | 0 refills | Status: DC | PRN

## 2016-12-27 MED ORDER — DOCUSATE SODIUM 100 MG PO CAPS: 100.0000 mg | ORAL_CAPSULE | Freq: Two times a day (BID) | ORAL | 0 refills | Status: DC

## 2016-12-27 MED ORDER — LACTATED RINGERS IV BOLUS (SEPSIS)
1000.0000 mL | Freq: Once | INTRAVENOUS | Status: AC
  Administered 2016-12-27: 1000 mL via INTRAVENOUS

## 2016-12-27 NOTE — Discharge Instructions (Signed)
Prenatal Care Providers [facility]  [phone]     Phone: [place]                      [facility]  @[place]     Phone: [phone]  Phone: [place]     Phone: [phone]  Phone: [facility] @ [place]                hone: [phone]  Phone: [phone]         [facility] [address]      Phone: [phone]  Phone: [phone]         [place] Dept.                Phone: [phone]  [place])          Phone: [phone] [facility]   Phone: [phone] Safe Medications in Pregnancy   Acne: Benzoyl Peroxide Salicylic Acid  Backache/Headache: Tylenol: 2 regular strength every 4 hours OR              2 Extra strength every 6 hours  Colds/Coughs/Allergies: Benadryl (alcohol free) 25 mg every 6 hours as needed Breath right strips Claritin Cepacol throat lozenges Chloraseptic throat spray Cold-Eeze- up to three times per day Cough drops, alcohol free Flonase (by prescription only) Guaifenesin Mucinex Robitussin DM (plain only, alcohol free) Saline nasal spray/drops Sudafed (pseudoephedrine) & Actifed ** use only after [redacted] weeks gestation and if you do not have high blood pressure Tylenol Vicks Vaporub Zinc lozenges Zyrtec   Constipation: Colace Ducolax suppositories Fleet enema Glycerin suppositories Metamucil Milk of magnesia Miralax Senokot Smooth move tea  Diarrhea: Kaopectate Imodium A-D  *NO pepto Bismol  Hemorrhoids: Anusol Anusol HC Preparation H Tucks  Indigestion: Tums Maalox Mylanta Zantac  Pepcid  Insomnia: Benadryl (alcohol free) 25mg  every 6 hours as needed Tylenol  PM Unisom, no Gelcaps  Leg Cramps: Tums MagGel  Nausea/Vomiting:  Bonine Dramamine Emetrol Ginger extract Sea bands Meclizine  Nausea medication to take during pregnancy:  Unisom (doxylamine succinate 25 mg tablets) Take one tablet daily at bedtime. If symptoms are not adequately controlled, the dose can be increased to a maximum recommended dose of two tablets daily (1/2 tablet in the morning, 1/2 tablet mid-afternoon and one at bedtime). Vitamin B6 100mg  tablets. Take one tablet twice a day (up to 200 mg per day).  Skin Rashes: Aveeno products Benadryl cream or 25mg  every 6 hours as needed Calamine Lotion 1% cortisone cream  Yeast infection: Gyne-lotrimin 7 Monistat 7   **If taking multiple medications, please check labels to avoid duplicating the same active ingredients **take medication as directed on the label ** Do not exceed 4000 mg of tylenol in 24 hours **Do not take medications that contain aspirin or ibuprofen

## 2016-12-27 NOTE — MAU Provider Note (Signed)
History     CSN: [id number]  Arrival date and time: 12/27/16 0272   First Provider Initiated Contact with Patient 12/27/16 [phone]      Chief Complaint  Patient presents with  . Constipation  . Emesis   Amanda [name] is a 36 y.o [id number] at [redacted]w[redacted]d who presents today with continuing nausea and vomiting. She was seen here about one week ago, and reported vomiting about 50 times per day. She states that she is still vomiting 50 times per day. She cannot keep any food or liquid down. She has scopolamine patch on, and has been taking zantac and Zofran. She is also now experiencing constipation. Last BM 12/21/16    Constipation  This is a new problem. The current episode started in the past 7 days. The problem is unchanged. The patient is not on a high fiber diet. She does not exercise regularly. There has not been adequate water intake. Associated symptoms include vomiting. Pertinent negatives include no fever. Risk factors include recent dehydration. She has tried nothing for the symptoms.  Emesis   This is a new problem. The current episode started more than 1 month ago. The problem occurs more than 10 times per day. The problem has been unchanged. The emesis has an appearance of stomach contents. There has been no fever. Associated symptoms include headaches (has chronic migraine). Pertinent negatives include no chills or fever. Risk factors: pregnancy  Treatments tried: scopolamine, zofran, zantac. The treatment provided no relief.   Past Medical History:  Diagnosis Date  . Anginal pain ([place])   . Anxiety   . Asthma   . Constipation   . Depression   . Dysrhythmia    Palpatations  . Genital warts   . GERD (gastroesophageal reflux disease)   . H/O hiatal hernia   . Headache(784.0)   . Heart murmur   . MRSA cellulitis   . Sickle cell trait [facility])     Past Surgical History:  Procedure Laterality Date  . CERVICAL CONE BIOPSY    . CESAREAN SECTION  01/20/2012   Procedure:  CESAREAN SECTION;  Surgeon: Melina Schools, MD;  Location: [facility];  Service: Obstetrics;  Laterality: N/A;  . COLONOSCOPY    . HERNIA REPAIR    . INSERTION OF MESH N/A 05/15/2015   Procedure: INSERTION OF MESH;  Surgeon: Ralene Ok, MD;  Location[facility];  Service: General;  Laterality: N/A;  . MARSUPIALIZATION URETHRAL DIVERTICULUM    . UMBILICAL HERNIA REPAIR N/A 05/15/2015   Procedure: LAPAROSCOPIC UMBILICAL HERNIA;  Surgeon: Ralene Ok, MD;  Location: [facility];  Service: General;  Laterality: N/A;  . UPPER GI ENDOSCOPY      Family History  Problem Relation Age of Onset  . Diabetes Mother   . Hypertension Mother   . Depression Mother   . Depression Father   . Heart disease Maternal Grandmother   . Breast cancer Maternal Aunt        [employer]  . Cancer Maternal Aunt   . Diabetes Maternal Grandfather   . Heart disease Maternal Grandfather   . Hypertension Maternal Grandfather   . Anesthesia problems Neg Hx     Social History   Tobacco Use  . Smoking status: Never Smoker  . Smokeless tobacco: Never Used  Substance Use Topics  . Alcohol use: No    Alcohol/week: 0.0 oz  . Drug use: No    Allergies:  Allergies  Allergen Reactions  . Doxycycline Hyclate Hives, Itching and Swelling  REACTION: swelling of face  . Ketorolac Tromethamine Other (See Comments)    Patient stated that it makes her extremely hyper  . Amoxicillin Hives, Swelling and Rash    Has patient had a PCN reaction causing immediate rash, facial/tongue/throat swelling, SOB or lightheadedness with hypotension: Unsure Has patient had a PCN reaction causing severe rash involving mucus membranes or skin necrosis: Unsure Has patient had a PCN reaction that required hospitalization No Has patient had a PCN reaction occurring within the last 10 years: No If all of the above answers are "NO", then may proceed with Cephalosporin use.REACTION: facial swelling and rash.    No medications prior to  admission.    Review of Systems  Constitutional: Negative for chills and fever.  Gastrointestinal: Positive for constipation and vomiting.  Genitourinary: Negative for decreased urine volume, dyspareunia, frequency, pelvic pain, urgency, vaginal bleeding and vaginal discharge.  Neurological: Positive for headaches (has chronic migraine).   Physical Exam   Blood pressure 119/69, pulse 84, temperature 98.4 F (36.9 C), temperature source Oral, resp. rate 18, weight 260 lb 12 oz (118.3 kg), last menstrual period 11/03/2016, SpO2 96 %.  Physical Exam  Nursing note and vitals reviewed. Constitutional: She is oriented to person, place, and time. She appears well-developed and well-nourished. No distress.  HENT:  Head: Normocephalic.  Cardiovascular: Normal rate.  Respiratory: Effort normal.  GI: Soft. There is no tenderness. There is no rebound.  Neurological: She is alert and oriented to person, place, and time.  Skin: Skin is warm and dry.  Psychiatric: She has a normal mood and affect.    Results for orders placed or performed during the hospital encounter of 12/27/16 (from the past 24 hour(s))  Urinalysis, Routine w reflex microscopic     Status: Abnormal   Collection Time: 12/27/16  7:55 AM  Result Value Ref Range   Color, Urine YELLOW YELLOW   APPearance HAZY (A) CLEAR   Specific Gravity, Urine 1.014 1.005 - 1.030   pH 6.0 5.0 - 8.0   Glucose, UA NEGATIVE NEGATIVE mg/dL   Hgb urine dipstick NEGATIVE NEGATIVE   Bilirubin Urine NEGATIVE NEGATIVE   Ketones, ur NEGATIVE NEGATIVE mg/dL   Protein, ur 30 (A) NEGATIVE mg/dL   Nitrite NEGATIVE NEGATIVE   Leukocytes, UA TRACE (A) NEGATIVE   RBC / HPF 0-5 0 - 5 RBC/hpf   WBC, UA 0-5 0 - 5 WBC/hpf   Bacteria, UA RARE (A) NONE SEEN   Squamous Epithelial / LPF 6-30 (A) NONE SEEN   Mucus PRESENT    US Ob Transvaginal  Result Date: 12/27/2016 CLINICAL DATA:  Pregnancy with inconclusive viability EXAM: TRANSVAGINAL OB ULTRASOUND  TECHNIQUE: Transvaginal ultrasound was performed for complete evaluation of the gestation as well as the maternal uterus, adnexal regions, and pelvic cul-de-sac. COMPARISON:  12/21/2016 FINDINGS: Intrauterine gestational sac: Single Yolk sac:  Visualized Embryo:  Visualized Cardiac Activity: Visualized Heart Rate: 126 bpm MSD:   mm    w     d CRL:   10.4  mm   7 w 1 d                  [place] EDC: 08/14/2017 Subchorionic hemorrhage:  None visualized. Maternal uterus/adnexae: No adnexal mass. Trace free fluid in the pelvis. IMPRESSION: Seven week 1 day intrauterine pregnancy. Fetal heart rate 126 beats per minute. No acute maternal findings. Electronically Signed   By: Rolm Baptise M.D.   On: 12/27/2016 10:21   MAU Course  Procedures  MDM Patient has had 1L of LR, 25 mg phenergan, 25 mg benadryl, and 10 mg decadron. She has not vomited while being here. She reports that her headache has improved. Patient has had SSE with excellent results.   DW patient that her urine does not indicate dehydration today, so she is able to keep some liquids down. Continue small frequent meals.   Assessment and Plan   1. Nausea/vomiting in pregnancy   2. Constipation during pregnancy in first trimester   3. [redacted] weeks gestation of pregnancy    DC home Comfort measures reviewed  1st Trimester precautions  RX: phenergan PRN #30, colace BID #60  Return to MAU as needed Start [facility] as soon as possible   Follow-up Information    Department, [facility] Follow up.   Contact information: [place] [address] [phone]            Marcille Buffy 12/27/2016, 3:08 PM

## 2016-12-27 NOTE — MAU Note (Signed)
Ongoing vomiting and constipation.  Has patch on, hasn't been taking zofran (until this morning), because she needs to poop.  No bm since last here.

## 2017-01-05 ENCOUNTER — Inpatient Hospital Stay (HOSPITAL_COMMUNITY): Payer: Medicaid Other

## 2017-01-05 ENCOUNTER — Inpatient Hospital Stay (HOSPITAL_COMMUNITY)
Admission: AD | Admit: 2017-01-05 | Discharge: 2017-01-05 | Disposition: A | Discharge status: Home / Self Care | Payer: Medicaid Other | Source: Ambulatory Visit | Attending: Family Medicine | Admitting: Family Medicine

## 2017-01-05 ENCOUNTER — Encounter (HOSPITAL_COMMUNITY): Payer: Self-pay | Admitting: Obstetrics and Gynecology

## 2017-01-05 DIAGNOSIS — O99611 Diseases of the digestive system complicating pregnancy, first trimester: Secondary | ICD-10-CM | POA: Diagnosis not present

## 2017-01-05 DIAGNOSIS — R1030 Lower abdominal pain, unspecified: Secondary | ICD-10-CM | POA: Diagnosis present

## 2017-01-05 DIAGNOSIS — O26899 Other specified pregnancy related conditions, unspecified trimester: Secondary | ICD-10-CM

## 2017-01-05 DIAGNOSIS — Z880 Allergy status to penicillin: Secondary | ICD-10-CM | POA: Diagnosis not present

## 2017-01-05 DIAGNOSIS — R102 Pelvic and perineal pain: Secondary | ICD-10-CM

## 2017-01-05 DIAGNOSIS — K117 Disturbances of salivary secretion: Secondary | ICD-10-CM | POA: Insufficient documentation

## 2017-01-05 DIAGNOSIS — O210 Mild hyperemesis gravidarum: Secondary | ICD-10-CM | POA: Insufficient documentation

## 2017-01-05 DIAGNOSIS — Z3A09 9 weeks gestation of pregnancy: Secondary | ICD-10-CM | POA: Insufficient documentation

## 2017-01-05 DIAGNOSIS — O26891 Other specified pregnancy related conditions, first trimester: Secondary | ICD-10-CM | POA: Diagnosis not present

## 2017-01-05 DIAGNOSIS — R109 Unspecified abdominal pain: Secondary | ICD-10-CM

## 2017-01-05 LAB — URINALYSIS, ROUTINE W REFLEX MICROSCOPIC
BILIRUBIN URINE: NEGATIVE
Bacteria, UA: NONE SEEN
GLUCOSE, UA: NEGATIVE mg/dL
HGB URINE DIPSTICK: NEGATIVE
KETONES UR: 20 mg/dL — AB
LEUKOCYTES UA: NEGATIVE
Nitrite: NEGATIVE
PH: 5 (ref 5.0–8.0)
PROTEIN: 30 mg/dL — AB
Specific Gravity, Urine: 1.016 (ref 1.005–1.030)

## 2017-01-05 LAB — CBC WITH DIFFERENTIAL/PLATELET
BASOS ABS: 0 10*3/uL (ref 0.0–0.1)
BASOS PCT: 0 %
EOS PCT: 0 %
Eosinophils Absolute: 0 10*3/uL (ref 0.0–0.7)
HCT: 37.6 % (ref 36.0–46.0)
Hemoglobin: 12.6 g/dL (ref 12.0–15.0)
Lymphocytes Relative: 12 %
Lymphs Abs: 1.1 10*3/uL (ref 0.7–4.0)
MCH: 27.2 pg (ref 26.0–34.0)
MCHC: 33.5 g/dL (ref 30.0–36.0)
MCV: 81 fL (ref 78.0–100.0)
MONO ABS: 0.3 10*3/uL (ref 0.1–1.0)
MONOS PCT: 3 %
Neutro Abs: 7.7 10*3/uL (ref 1.7–7.7)
Neutrophils Relative %: 85 %
PLATELETS: 185 10*3/uL (ref 150–400)
RBC: 4.64 MIL/uL (ref 3.87–5.11)
RDW: 14.7 % (ref 11.5–15.5)
WBC: 9.1 10*3/uL (ref 4.0–10.5)

## 2017-01-05 LAB — COMPREHENSIVE METABOLIC PANEL
ALBUMIN: 3.7 g/dL (ref 3.5–5.0)
ALT: 13 U/L — ABNORMAL LOW (ref 14–54)
ANION GAP: 9 (ref 5–15)
AST: 17 U/L (ref 15–41)
Alkaline Phosphatase: 53 U/L (ref 38–126)
BUN: 9 mg/dL (ref 6–20)
CHLORIDE: 102 mmol/L (ref 101–111)
CO2: 24 mmol/L (ref 22–32)
Calcium: 9.2 mg/dL (ref 8.9–10.3)
Creatinine, Ser: 0.57 mg/dL (ref 0.44–1.00)
GFR calc Af Amer: >60 mL/min (ref >60)
GLUCOSE: 85 mg/dL (ref 65–99)
POTASSIUM: 3.9 mmol/L (ref 3.5–5.1)
Sodium: 135 mmol/L (ref 135–145)
TOTAL PROTEIN: 7.3 g/dL (ref 6.5–8.1)
Total Bilirubin: 0.5 mg/dL (ref 0.3–1.2)

## 2017-01-05 LAB — HCG, QUANTITATIVE, PREGNANCY: HCG, BETA CHAIN, QUANT, S: 154157 m[IU]/mL — AB (ref <5)

## 2017-01-05 MED ORDER — GLYCOPYRROLATE 1 MG PO TABS: 1.0000 mg | ORAL_TABLET | Freq: Three times a day (TID) | ORAL | 0 refills | Status: DC

## 2017-01-05 NOTE — MAU Note (Signed)
Pt reports she started having severe pelvic pain a out an hour ago. Denies any vaginal bleeding. Pt reports having constant nausea. Has ptyalism and vomits several times a day as well.

## 2017-01-05 NOTE — MAU Provider Note (Signed)
History     CSN: [id number]  Arrival date and time: 01/05/17 1215   First Provider Initiated Contact with Patient 01/05/17 1234      Chief Complaint  Patient presents with  . Abdominal Pain  . Morning Sickness   HPI  Amanda [name] is a 36 y.o. [phone] at [redacted]w[redacted]d gestation presenting to MAU crying in "exterme pain" with complaints of being constipated for "a while", but just starting to pass gas, having a BM today at 1100 AM with a sudden onset of "sharp" lower abdomen pain. She states "the pain feels like I'm in labor". She has had on-going N/V throughout this pregnancy. She reports that she started spitting about 2-3 wks ago. She reports that she has an abortion scheduled for tomorrow and "just wants to get this over with".  Past Medical History:  Diagnosis Date  . Anginal pain ([place])   . Anxiety   . Asthma   . Constipation   . Depression   . Dysrhythmia    Palpatations  . Genital warts   . GERD (gastroesophageal reflux disease)   . H/O hiatal hernia   . Headache(784.0)   . Heart murmur   . MRSA cellulitis   . Sickle cell trait [facility])     Past Surgical History:  Procedure Laterality Date  . CERVICAL CONE BIOPSY    . CESAREAN SECTION  01/20/2012   Procedure: CESAREAN SECTION;  Surgeon: Melina Schools, MD;  Location: [place] [facility];  Service: Obstetrics;  Laterality: N/A;  . COLONOSCOPY    . HERNIA REPAIR    . INSERTION OF MESH N/A 05/15/2015   Procedure: INSERTION OF MESH;  Surgeon: Ralene Ok, MD;  Location: [facility];  Service: General;  Laterality: N/A;  . MARSUPIALIZATION URETHRAL DIVERTICULUM    . UMBILICAL HERNIA REPAIR N/A 05/15/2015   Procedure: LAPAROSCOPIC UMBILICAL HERNIA;  Surgeon: Ralene Ok, MD;  Location: [facility];  Service: General;  Laterality: N/A;  . UPPER GI ENDOSCOPY      Family History  Problem Relation Age of Onset  . Diabetes Mother   . Hypertension Mother   . Depression Mother   . Depression Father   . Heart disease Maternal  Grandmother   . Breast cancer Maternal Aunt        [employer]  . Cancer Maternal Aunt   . Diabetes Maternal Grandfather   . Heart disease Maternal Grandfather   . Hypertension Maternal Grandfather   . Anesthesia problems Neg Hx     Social History   Tobacco Use  . Smoking status: Never Smoker  . Smokeless tobacco: Never Used  Substance Use Topics  . Alcohol use: No    Alcohol/week: 0.0 oz  . Drug use: No    Allergies:  Allergies  Allergen Reactions  . Doxycycline Hyclate Hives, Itching and Swelling    REACTION: swelling of face  . Ketorolac Tromethamine Other (See Comments)    Patient stated that it makes her extremely hyper  . Amoxicillin Hives, Swelling and Rash    Has patient had a PCN reaction causing immediate rash, facial/tongue/throat swelling, SOB or lightheadedness with hypotension: Unsure Has patient had a PCN reaction causing severe rash involving mucus membranes or skin necrosis: Unsure Has patient had a PCN reaction that required hospitalization No Has patient had a PCN reaction occurring within the last 10 years: No If all of the above answers are "NO", then may proceed with Cephalosporin use.REACTION: facial swelling and rash.    Medications Prior to  Admission  Medication Sig Dispense Refill Last Dose  . docusate sodium (COLACE) 100 MG capsule Take 1 capsule (100 mg total) by mouth every 12 (twelve) hours. 60 capsule 0   . ondansetron (ZOFRAN ODT) 8 MG disintegrating tablet Take 1 tablet (8 mg total) by mouth every 8 (eight) hours as needed for nausea or vomiting. 20 tablet 0 12/27/2016 at Unknown time  . promethazine (PHENERGAN) 25 MG tablet Take 0.5-1 tablets (12.5-25 mg total) by mouth every 6 (six) hours as needed. 30 tablet 0   . ranitidine (ZANTAC) 150 MG capsule Take 1 capsule (150 mg total) by mouth 2 (two) times daily. 60 capsule 1 12/26/2016 at Unknown time  . scopolamine (TRANSDERM-SCOP) 1 MG/3DAYS Place 1 patch (1.5 mg total) onto the skin every 3  (three) days. 10 patch 12 12/27/2016 at Unknown time    Review of Systems  Constitutional: Negative.   HENT: Negative.   Eyes: Negative.   Respiratory: Negative.   Cardiovascular: Negative.   Gastrointestinal: Positive for abdominal pain, constipation, nausea and vomiting.  Endocrine: Negative.   Genitourinary: Positive for pelvic pain.  Musculoskeletal: Negative.   Skin: Negative.   Allergic/Immunologic: Negative.   Neurological: Negative.   Hematological: Negative.   Psychiatric/Behavioral: Negative.    Physical Exam   Blood pressure (!) 121/57, pulse 75, temperature 98.4 F (36.9 C), resp. rate 20, last menstrual period 11/03/2016, SpO2 98 %.  Physical Exam  Constitutional: She appears well-developed and well-nourished. She is cooperative. She appears distressed.  HENT:  Head: Normocephalic and atraumatic.  Eyes: Pupils are equal, round, and reactive to light.  Neck: Normal range of motion.  Cardiovascular: Normal rate, regular rhythm and normal heart sounds.  Respiratory: Effort normal and breath sounds normal.  GI: Soft. Normal appearance and bowel sounds are normal. There is tenderness in the suprapubic area.    Musculoskeletal: Normal range of motion.       Lumbar back: She exhibits pain.       Back:       Arms: Neurological: She is alert.  Skin: Skin is warm and dry.  Psychiatric: She has a normal mood and affect. Her behavior is normal. Judgment and thought content normal.  Writhing back in forth in bed, crying     MAU Course  Procedures  MDM CCUA CBC with Diff CMP OB Limited Transvaginal U/S  Results for orders placed or performed during the hospital encounter of 01/05/17 (from the past 24 hour(s))  Urinalysis, Routine w reflex microscopic     Status: Abnormal   Collection Time: 01/05/17 12:17 PM  Result Value Ref Range   Color, Urine YELLOW YELLOW   APPearance HAZY (A) CLEAR   Specific Gravity, Urine 1.016 1.005 - 1.030   pH 5.0 5.0 - 8.0    Glucose, UA NEGATIVE NEGATIVE mg/dL   Hgb urine dipstick NEGATIVE NEGATIVE   Bilirubin Urine NEGATIVE NEGATIVE   Ketones, ur 20 (A) NEGATIVE mg/dL   Protein, ur 30 (A) NEGATIVE mg/dL   Nitrite NEGATIVE NEGATIVE   Leukocytes, UA NEGATIVE NEGATIVE   RBC / HPF 0-5 0 - 5 RBC/hpf   WBC, UA 0-5 0 - 5 WBC/hpf   Bacteria, UA NONE SEEN NONE SEEN   Squamous Epithelial / LPF 0-5 (A) NONE SEEN   Mucus PRESENT   hCG, quantitative, pregnancy     Status: Abnormal   Collection Time: 01/05/17  1:34 PM  Result Value Ref Range   hCG, Beta Chain, Quant, S 154,157 (H) <5 mIU/mL  CBC  with Differential/Platelet     Status: None   Collection Time: 01/05/17  1:34 PM  Result Value Ref Range   WBC 9.1 4.0 - 10.5 K/uL   RBC 4.64 3.87 - 5.11 MIL/uL   Hemoglobin 12.6 12.0 - 15.0 g/dL   HCT 37.6 36.0 - 46.0 %   MCV 81.0 78.0 - 100.0 fL   MCH 27.2 26.0 - 34.0 pg   MCHC 33.5 30.0 - 36.0 g/dL   RDW 14.7 11.5 - 15.5 %   Platelets 185 150 - 400 K/uL   Neutrophils Relative % 85 %   Neutro Abs 7.7 1.7 - 7.7 K/uL   Lymphocytes Relative 12 %   Lymphs Abs 1.1 0.7 - 4.0 K/uL   Monocytes Relative 3 %   Monocytes Absolute 0.3 0.1 - 1.0 K/uL   Eosinophils Relative 0 %   Eosinophils Absolute 0.0 0.0 - 0.7 K/uL   Basophils Relative 0 %   Basophils Absolute 0.0 0.0 - 0.1 K/uL  Comprehensive metabolic panel     Status: Abnormal   Collection Time: 01/05/17  1:34 PM  Result Value Ref Range   Sodium 135 135 - 145 mmol/L   Potassium 3.9 3.5 - 5.1 mmol/L   Chloride 102 101 - 111 mmol/L   CO2 24 22 - 32 mmol/L   Glucose, Bld 85 65 - 99 mg/dL   BUN 9 6 - 20 mg/dL   Creatinine, Ser 0.57 0.44 - 1.00 mg/dL   Calcium 9.2 8.9 - 10.3 mg/dL   Total Protein 7.3 6.5 - 8.1 g/dL   Albumin 3.7 3.5 - 5.0 g/dL   AST 17 15 - 41 U/L   ALT 13 (L) 14 - 54 U/L   Alkaline Phosphatase 53 38 - 126 U/L   Total Bilirubin 0.5 0.3 - 1.2 mg/dL   GFR calc non Af Amer >60 >60 mL/min   GFR calc Af Amer >60 >60 mL/min   Anion gap 9 5 - 15   US  Ob Transvaginal  Result Date: 01/05/2017 CLINICAL DATA:  Abdominal pain in first trimester pregnancy. Current assigned gestational age of [redacted] weeks 0 days. EXAM: TRANSVAGINAL OB ULTRASOUND TECHNIQUE: Transvaginal ultrasound was performed for complete evaluation of the gestation as well as the maternal uterus, adnexal regions, and pelvic cul-de-sac. COMPARISON:  12/27/2016 FINDINGS: Intrauterine gestational sac: Single Yolk sac:  Not Visualized. Embryo:  Visualized. Cardiac Activity: Visualized. Heart Rate: 164 bpm CRL:   18  mm   8 w 2 d                  [place] EDC: 08/15/2017 Subchorionic hemorrhage:  None visualized. Maternal uterus/adnexae: Left ovary is normal appearance. Right ovary not directly visualized, however no adnexal mass identified. Tiny amount of simple free fluid noted in cul-de-sac. IMPRESSION: Single living IUP with assigned gestational age of [redacted] weeks 0 days. Appropriate interval growth. No significant maternal uterine or adnexal abnormality identified. Electronically Signed   By: Earle Gell M.D.   On: 01/05/2017 14:16    Assessment and Plan  Abdominal pain affecting pregnancy  - Notified of normal U/S results  Ptyalism - Rx for Robinul 1 mg TID - Continue antiemetics  Suprapubic pain - UCx pending  Discharge home Patient is still planning to have EAB tomorrow @ [address], MSN, CNM 01/05/2017, 12:34 PM

## 2017-01-05 NOTE — MAU Note (Signed)
Pt presents with c/o mid lower abdominal pain that began approx 1 hour ago.  Describes pain as "really bad" menstraul cramps.  Denies VB.

## 2017-01-06 ENCOUNTER — Telehealth: Payer: Self-pay

## 2017-01-06 LAB — CULTURE, OB URINE
Culture: NO GROWTH
SPECIAL REQUESTS: NORMAL

## 2017-01-06 NOTE — Telephone Encounter (Signed)
Called Pt LM on VM requesting her to rtrn my call concerning botox appointment 01/13/17 and pregnancy status.

## 2017-01-09 ENCOUNTER — Telehealth: Payer: Self-pay | Admitting: Neurology

## 2017-01-09 NOTE — Telephone Encounter (Signed)
Called and spoke with Pt. Advsd her unable to get Botox while she is pregnant and when we saw she was pregnant, I didn't pursue PA. Pt states she terminated the pregnancy 12/13 and wishes to proceed w/Botox. Advsd her I will check to see if I can get Botox apprvdv and dlivered by Fri.

## 2017-01-09 NOTE — Telephone Encounter (Signed)
Patient called and did not want to leave a Voicemail. Please Call. Thanks

## 2017-01-11 NOTE — Telephone Encounter (Signed)
Called Pt, LM on VM advising Botox will not be here by 01/13/17 and to call and R/S her appt.

## 2017-01-13 ENCOUNTER — Ambulatory Visit: Payer: Self-pay | Admitting: Neurology

## 2017-03-13 DIAGNOSIS — M2391 Unspecified internal derangement of right knee: Secondary | ICD-10-CM | POA: Insufficient documentation

## 2017-03-15 NOTE — Progress Notes (Signed)
Rcvd fax that quantity was missing from last Botox request. Refaxed form from January with quantity.

## 2017-03-20 DIAGNOSIS — M2241 Chondromalacia patellae, right knee: Secondary | ICD-10-CM | POA: Insufficient documentation

## 2017-03-30 ENCOUNTER — Ambulatory Visit (HOSPITAL_COMMUNITY): Payer: Medicaid Other | Admitting: Anesthesiology

## 2017-03-30 ENCOUNTER — Other Ambulatory Visit: Payer: Self-pay

## 2017-03-30 ENCOUNTER — Encounter (HOSPITAL_COMMUNITY)
Admission: AD | Disposition: A | Discharge status: Home / Self Care | Payer: Self-pay | Source: Ambulatory Visit | Attending: Obstetrics and Gynecology

## 2017-03-30 ENCOUNTER — Encounter (HOSPITAL_COMMUNITY): Payer: Self-pay | Admitting: *Deleted

## 2017-03-30 ENCOUNTER — Ambulatory Visit (HOSPITAL_COMMUNITY)
Admission: AD | Admit: 2017-03-30 | Discharge: 2017-03-30 | Disposition: A | Discharge status: Home / Self Care | Payer: Medicaid Other | Source: Ambulatory Visit | Attending: Obstetrics and Gynecology | Admitting: Obstetrics and Gynecology

## 2017-03-30 ENCOUNTER — Ambulatory Visit (HOSPITAL_COMMUNITY): Payer: Medicaid Other

## 2017-03-30 DIAGNOSIS — Z6841 Body Mass Index (BMI) 40.0 and over, adult: Secondary | ICD-10-CM | POA: Diagnosis not present

## 2017-03-30 DIAGNOSIS — K219 Gastro-esophageal reflux disease without esophagitis: Secondary | ICD-10-CM | POA: Insufficient documentation

## 2017-03-30 DIAGNOSIS — J45909 Unspecified asthma, uncomplicated: Secondary | ICD-10-CM | POA: Insufficient documentation

## 2017-03-30 DIAGNOSIS — Z79899 Other long term (current) drug therapy: Secondary | ICD-10-CM | POA: Insufficient documentation

## 2017-03-30 DIAGNOSIS — O034 Incomplete spontaneous abortion without complication: Secondary | ICD-10-CM | POA: Diagnosis present

## 2017-03-30 DIAGNOSIS — F419 Anxiety disorder, unspecified: Secondary | ICD-10-CM | POA: Insufficient documentation

## 2017-03-30 DIAGNOSIS — D573 Sickle-cell trait: Secondary | ICD-10-CM | POA: Insufficient documentation

## 2017-03-30 DIAGNOSIS — F329 Major depressive disorder, single episode, unspecified: Secondary | ICD-10-CM | POA: Insufficient documentation

## 2017-03-30 DIAGNOSIS — R58 Hemorrhage, not elsewhere classified: Secondary | ICD-10-CM

## 2017-03-30 HISTORY — PX: DILATION AND EVACUATION: SHX1459

## 2017-03-30 LAB — CBC
HEMATOCRIT: 38.7 % (ref 36.0–46.0)
Hemoglobin: 13.1 g/dL (ref 12.0–15.0)
MCH: 26.9 pg (ref 26.0–34.0)
MCHC: 33.9 g/dL (ref 30.0–36.0)
MCV: 79.5 fL (ref 78.0–100.0)
PLATELETS: 150 10*3/uL (ref 150–400)
RBC: 4.87 MIL/uL (ref 3.87–5.11)
RDW: 14.8 % (ref 11.5–15.5)
WBC: 7.8 10*3/uL (ref 4.0–10.5)

## 2017-03-30 SURGERY — DILATION AND EVACUATION, UTERUS
Anesthesia: Monitor Anesthesia Care | Site: Vagina

## 2017-03-30 MED ORDER — SCOPOLAMINE 1 MG/3DAYS TD PT72
MEDICATED_PATCH | TRANSDERMAL | Status: AC
  Filled 2017-03-30: qty 1

## 2017-03-30 MED ORDER — LACTATED RINGERS IV SOLN
INTRAVENOUS | Status: DC
  Administered 2017-03-30: 11:00:00 via INTRAVENOUS

## 2017-03-30 MED ORDER — ONDANSETRON HCL 4 MG/2ML IJ SOLN
INTRAMUSCULAR | Status: DC | PRN
Start: 2017-03-30 — End: 2017-03-30
  Administered 2017-03-30: 4 mg via INTRAVENOUS

## 2017-03-30 MED ORDER — LACTATED RINGERS IV SOLN: INTRAVENOUS | Status: DC

## 2017-03-30 MED ORDER — LIDOCAINE HCL 2 % IJ SOLN
INTRAMUSCULAR | Status: DC | PRN
  Administered 2017-03-30 (×2): 10 mL

## 2017-03-30 MED ORDER — IBUPROFEN 800 MG PO TABS: 800.0000 mg | ORAL_TABLET | Freq: Three times a day (TID) | ORAL | 1 refills | Status: DC | PRN

## 2017-03-30 MED ORDER — PROPOFOL 500 MG/50ML IV EMUL
INTRAVENOUS | Status: DC | PRN
  Administered 2017-03-30 (×2): 30 mg via INTRAVENOUS
  Administered 2017-03-30: 20 mg via INTRAVENOUS
  Administered 2017-03-30: 50 mg via INTRAVENOUS

## 2017-03-30 MED ORDER — MIDAZOLAM HCL 2 MG/2ML IJ SOLN
INTRAMUSCULAR | Status: DC | PRN
  Administered 2017-03-30: 2 mg via INTRAVENOUS

## 2017-03-30 MED ORDER — PROPOFOL 500 MG/50ML IV EMUL: INTRAVENOUS | Status: DC | PRN

## 2017-03-30 MED ORDER — OXYCODONE HCL 5 MG/5ML PO SOLN: 5.0000 mg | Freq: Once | ORAL | Status: DC | PRN

## 2017-03-30 MED ORDER — LIDOCAINE HCL (CARDIAC) 20 MG/ML IV SOLN
INTRAVENOUS | Status: DC | PRN
  Administered 2017-03-30: 50 mg via INTRAVENOUS

## 2017-03-30 MED ORDER — DEXAMETHASONE SODIUM PHOSPHATE 4 MG/ML IJ SOLN
INTRAMUSCULAR | Status: AC
  Filled 2017-03-30: qty 1

## 2017-03-30 MED ORDER — DEXAMETHASONE SODIUM PHOSPHATE 4 MG/ML IJ SOLN
INTRAMUSCULAR | Status: DC | PRN
  Administered 2017-03-30: 4 mg via INTRAVENOUS

## 2017-03-30 MED ORDER — GENTAMICIN SULFATE 40 MG/ML IJ SOLN
INTRAVENOUS | Status: AC
  Administered 2017-03-30: 116.25 mL via INTRAVENOUS
  Filled 2017-03-30: qty 10.25

## 2017-03-30 MED ORDER — FENTANYL CITRATE (PF) 250 MCG/5ML IJ SOLN
INTRAMUSCULAR | Status: AC
  Filled 2017-03-30: qty 5

## 2017-03-30 MED ORDER — FENTANYL CITRATE (PF) 100 MCG/2ML IJ SOLN
INTRAMUSCULAR | Status: DC | PRN
  Administered 2017-03-30: 100 ug via INTRAVENOUS

## 2017-03-30 MED ORDER — OXYCODONE HCL 5 MG PO TABS: 5.0000 mg | ORAL_TABLET | Freq: Once | ORAL | Status: DC | PRN

## 2017-03-30 MED ORDER — MIDAZOLAM HCL 2 MG/2ML IJ SOLN
INTRAMUSCULAR | Status: AC
  Filled 2017-03-30: qty 2

## 2017-03-30 MED ORDER — FENTANYL CITRATE (PF) 100 MCG/2ML IJ SOLN
25.0000 ug | INTRAMUSCULAR | Status: DC | PRN
  Administered 2017-03-30: 50 ug via INTRAVENOUS

## 2017-03-30 MED ORDER — SCOPOLAMINE 1 MG/3DAYS TD PT72
1.0000 | MEDICATED_PATCH | Freq: Once | TRANSDERMAL | Status: DC
  Administered 2017-03-30: 1.5 mg via TRANSDERMAL

## 2017-03-30 MED ORDER — KETOROLAC TROMETHAMINE 30 MG/ML IJ SOLN
INTRAMUSCULAR | Status: AC
Start: 2017-03-30 — End: ?
  Filled 2017-03-30: qty 1

## 2017-03-30 MED ORDER — ONDANSETRON HCL 4 MG/2ML IJ SOLN
INTRAMUSCULAR | Status: AC
  Filled 2017-03-30: qty 2

## 2017-03-30 MED ORDER — FENTANYL CITRATE (PF) 100 MCG/2ML IJ SOLN
INTRAMUSCULAR | Status: AC
  Administered 2017-03-30: 50 ug via INTRAVENOUS
  Filled 2017-03-30: qty 2

## 2017-03-30 MED ORDER — PROMETHAZINE HCL 25 MG/ML IJ SOLN: 6.2500 mg | INTRAMUSCULAR | Status: DC | PRN

## 2017-03-30 MED ORDER — PROPOFOL 10 MG/ML IV BOLUS
INTRAVENOUS | Status: AC
  Filled 2017-03-30: qty 20

## 2017-03-30 MED ORDER — LIDOCAINE HCL 2 % IJ SOLN
INTRAMUSCULAR | Status: AC
  Filled 2017-03-30: qty 20

## 2017-03-30 MED ORDER — LIDOCAINE HCL (CARDIAC) 20 MG/ML IV SOLN
INTRAVENOUS | Status: AC
  Filled 2017-03-30: qty 5

## 2017-03-30 MED ORDER — KETOROLAC TROMETHAMINE 30 MG/ML IJ SOLN
INTRAMUSCULAR | Status: DC | PRN
  Administered 2017-03-30: 30 mg via INTRAVENOUS

## 2017-03-30 SURGICAL SUPPLY — 19 items
CATH ROBINSON RED A/P 16FR (CATHETERS) ×3 IMPLANT
DECANTER SPIKE VIAL GLASS SM (MISCELLANEOUS) ×3 IMPLANT
GLOVE BIO SURGEON STRL SZ 6.5 (GLOVE) ×2 IMPLANT
GLOVE BIO SURGEONS STRL SZ 6.5 (GLOVE) ×1
GLOVE BIOGEL PI IND STRL 7.0 (GLOVE) ×1 IMPLANT
GLOVE BIOGEL PI INDICATOR 7.0 (GLOVE) ×2
GOWN STRL REUS W/TWL LRG LVL3 (GOWN DISPOSABLE) ×6 IMPLANT
KIT BERKELEY 1ST TRIMESTER 3/8 (MISCELLANEOUS) ×3 IMPLANT
NEEDLE SPNL 22GX3.5 QUINCKE BK (NEEDLE) ×3 IMPLANT
NS IRRIG 1000ML POUR BTL (IV SOLUTION) ×3 IMPLANT
PACK VAGINAL MINOR WOMEN LF (CUSTOM PROCEDURE TRAY) ×3 IMPLANT
PAD OB MATERNITY 4.3X12.25 (PERSONAL CARE ITEMS) ×3 IMPLANT
PAD PREP 24X48 CUFFED NSTRL (MISCELLANEOUS) ×3 IMPLANT
SET BERKELEY SUCTION TUBING (SUCTIONS) ×3 IMPLANT
TOWEL OR 17X24 6PK STRL BLUE (TOWEL DISPOSABLE) ×6 IMPLANT
VACURETTE 10 RIGID CVD (CANNULA) IMPLANT
VACURETTE 7MM CVD STRL WRAP (CANNULA) IMPLANT
VACURETTE 8 RIGID CVD (CANNULA) ×3 IMPLANT
VACURETTE 9 RIGID CVD (CANNULA) IMPLANT

## 2017-03-30 NOTE — Anesthesia Preprocedure Evaluation (Addendum)
Anesthesia Evaluation  Patient identified by MRN, date of birth, ID band Patient awake    Reviewed: Allergy & Precautions, NPO status , Patient's Chart, lab work & pertinent test results  Airway Mallampati: II  TM Distance: <3 FB Neck ROM: Full    Dental no notable dental hx. (+) Dental Advisory Given   Pulmonary asthma ,    Pulmonary exam normal breath sounds clear to auscultation       Cardiovascular Normal cardiovascular exam Rhythm:Regular Rate:Normal  '14 TTE - EF 65%, mild LVH, no RWMA or valvular pathology.   Neuro/Psych Anxiety Depression negative neurological ROS     GI/Hepatic Neg liver ROS, hiatal hernia, GERD  Medicated and Controlled,  Endo/Other  Morbid obesity  Renal/GU negative Renal ROS  negative genitourinary   Musculoskeletal negative musculoskeletal ROS (+)   Abdominal   Peds negative pediatric ROS (+)  Hematology  (+) Sickle cell trait ,   Anesthesia Other Findings   Reproductive/Obstetrics negative OB ROS                             Anesthesia Physical  Anesthesia Plan  ASA: III  Anesthesia Plan: MAC   Post-op Pain Management:    Induction: Intravenous  PONV Risk Score and Plan: Propofol infusion and Treatment may vary due to age or medical condition  Airway Management Planned: Simple Face Mask and Natural Airway  Additional Equipment: None  Intra-op Plan:   Post-operative Plan:   Informed Consent: I have reviewed the patients History and Physical, chart, labs and discussed the procedure including the risks, benefits and alternatives for the proposed anesthesia with the patient or authorized representative who has indicated his/her understanding and acceptance.   Dental advisory given  Plan Discussed with: CRNA  Anesthesia Plan Comments: (Allergic to Toradol)       Anesthesia Quick Evaluation

## 2017-03-30 NOTE — Transfer of Care (Signed)
Immediate Anesthesia Transfer of Care Note  Patient: Amanda [name]  Procedure(s) Performed: DILATATION AND EVACUATION (N/A Vagina )  Patient Location: PACU  Anesthesia Type:MAC  Level of Consciousness: awake, alert  and oriented  Airway & Oxygen Therapy: Patient Spontanous Breathing  Post-op Assessment: Report given to RN and Post -op Vital signs reviewed and stable  Post vital signs: Reviewed and stable HR 78, RR 20, SaO2 100%, BP 111/86  Last Vitals:  Vitals:   03/30/17 1050  BP: 132/80  Pulse: 84  Resp: 18  Temp: 37 C  SpO2: 100%    Last Pain:  Vitals:   03/30/17 1050  TempSrc: Oral  PainSc: 7          Complications: No apparent anesthesia complications

## 2017-03-30 NOTE — Discharge Instructions (Signed)
DISCHARGE INSTRUCTIONS: D&C / D&E The following instructions have been prepared to help you care for yourself upon your return home.   Personal hygiene:  Use sanitary pads for vaginal drainage, not tampons.  Shower the day after your procedure.  NO tub baths, pools or Jacuzzis for 2-3 weeks.  Wipe front to back after using the bathroom.  Activity and limitations:  Do NOT drive or operate any equipment for 24 hours. The effects of anesthesia are still present and drowsiness may result.  Do NOT rest in bed all day.  Walking is encouraged.  Walk up and down stairs slowly.  You may resume your normal activity in one to two days or as indicated by your physician.  Sexual activity: NO intercourse for at least 2 weeks after the procedure, or as indicated by your physician.  Diet: Eat a light meal as desired this evening. You may resume your usual diet tomorrow.  Return to work: You may resume your work activities in one to two days or as indicated by your doctor.  What to expect after your surgery: Expect to have vaginal bleeding/discharge for 2-3 days and spotting for up to 10 days. It is not unusual to have soreness for up to 1-2 weeks. You may have a slight burning sensation when you urinate for the first day. Mild cramps may continue for a couple of days. You may have a regular period in 2-6 weeks.  Call your doctor for any of the following:  Excessive vaginal bleeding, saturating and changing one pad every hour.  Inability to urinate 6 hours after discharge from hospital.  Pain not relieved by pain medication.  Fever of 100.4 F or greater.  Unusual vaginal discharge or odor.   Call for an appointment:    Patients signature: ______________________  Nurses signature ________________________  Support person's signature_______________________    Post Anesthesia Home Care Instructions  Activity: Get plenty of rest for the remainder of the day. A responsible  individual must stay with you for 24 hours following the procedure.  For the next 24 hours, DO NOT: -Drive a car -Paediatric nurse -Drink alcoholic beverages -Take any medication unless instructed by your physician -Make any legal decisions or sign important papers.  Meals: Start with liquid foods such as gelatin or soup. Progress to regular foods as tolerated. Avoid greasy, spicy, heavy foods. If nausea and/or vomiting occur, drink only clear liquids until the nausea and/or vomiting subsides. Call your physician if vomiting continues.  Special Instructions/Symptoms: Your throat may feel dry or sore from the anesthesia or the breathing tube placed in your throat during surgery. If this causes discomfort, gargle with warm salt water. The discomfort should disappear within 24 hours.  If you had a scopolamine patch placed behind your ear for the management of post- operative nausea and/or vomiting:  1. The medication in the patch is effective for 72 hours, after which it should be removed.  Wrap patch in a tissue and discard in the trash. Wash hands thoroughly with soap and water. 2. You may remove the patch earlier than 72 hours if you experience unpleasant side effects which may include dry mouth, dizziness or visual disturbances. 3. Avoid touching the patch. Wash your hands with soap and water after contact with the patch.    NO IBUPROFEN PRODUCTS (MOTRIN, ADVIL) OR ALEVE UNTIL 7:30PM TODAY.

## 2017-03-30 NOTE — Anesthesia Postprocedure Evaluation (Signed)
Anesthesia Post Note  Patient: Amanda [name]  Procedure(s) Performed: DILATATION AND EVACUATION WITH ULTRASOUND GUIDANCE (N/A Vagina )     Patient location during evaluation: PACU Anesthesia Type: MAC Level of consciousness: awake and alert Pain management: pain level controlled Vital Signs Assessment: post-procedure vital signs reviewed and stable Respiratory status: spontaneous breathing, nonlabored ventilation and respiratory function stable Cardiovascular status: stable and blood pressure returned to baseline Anesthetic complications: no    Last Vitals:  Vitals:   03/30/17 1415 03/30/17 1430  BP: (!) 100/52 107/68  Pulse: 79 85  Resp: 19 (!) 21  Temp:    SpO2: 94% 100%    Last Pain:  Vitals:   03/30/17 1415  TempSrc:   PainSc: Asleep   Pain Goal:                 Amanda [name]

## 2017-03-30 NOTE — Interval H&P Note (Signed)
History and Physical Interval Note:  03/30/2017 11:44 AM  [name]  has presented today for surgery, with the diagnosis of MAB, pain  The various methods of treatment have been discussed with the patient and family. After consideration of risks, benefits and other options for treatment, the patient has consented to  Procedure(s): DILATATION AND EVACUATION (N/A) as a surgical intervention .  The patient's history has been reviewed, patient examined, no change in status, stable for surgery.  I have reviewed the patient's chart and labs.  Questions were answered to the patient's satisfaction.     Amanda [name]

## 2017-03-30 NOTE — H&P (Signed)
Amanda [name] is an 37 y.o. female [phone] With RPOC after recent TAB - Pt c/o pain and irregulr Bleeding.  US shows [place].  D/w pt r/b/a of procedure.    Pertinent Gynecological History: OB History: G4, P1031 SVD, SAB x 2, TAB   + abn pap, last WNL 3/17 H/o Chl  Menstrual History:  Patient's last menstrual period was 11/03/2016.    Past Medical History:  Diagnosis Date  . Anginal pain ([place])   . Anxiety   . Asthma   . Constipation   . Depression   . Dysrhythmia    Palpatations  . Genital warts   . GERD (gastroesophageal reflux disease)   . H/O hiatal hernia   . Headache(784.0)   . Heart murmur   . MRSA cellulitis   . Sickle cell trait [facility])     Past Surgical History:  Procedure Laterality Date  . CERVICAL CONE BIOPSY    . CESAREAN SECTION  01/20/2012   Procedure: CESAREAN SECTION;  Surgeon: Melina Schools, MD;  Location: [place] [facility];  Service: Obstetrics;  Laterality: N/A;  . COLONOSCOPY    . HERNIA REPAIR    . INSERTION OF MESH N/A 05/15/2015   Procedure: INSERTION OF MESH;  Surgeon: Ralene Ok, MD;  Location: [facility];  Service: General;  Laterality: N/A;  . MARSUPIALIZATION URETHRAL DIVERTICULUM    . UMBILICAL HERNIA REPAIR N/A 05/15/2015   Procedure: LAPAROSCOPIC UMBILICAL HERNIA;  Surgeon: Ralene Ok, MD;  Location: [facility];  Service: General;  Laterality: N/A;  . UPPER GI ENDOSCOPY      Family History  Problem Relation Age of Onset  . Diabetes Mother   . Hypertension Mother   . Depression Mother   . Depression Father   . Heart disease Maternal Grandmother   . Breast cancer Maternal Aunt        [employer]  . Cancer Maternal Aunt   . Diabetes Maternal Grandfather   . Heart disease Maternal Grandfather   . Hypertension Maternal Grandfather   . Anesthesia problems Neg Hx     Social History:  reports that  has never smoked. she has never used smokeless tobacco. She reports that she does not drink alcohol or use drugs. 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 Doxycycline Hyclate Hives, Itching and Swelling    REACTION: swelling of face  . Ketorolac Tromethamine Other (See Comments)    Patient stated that it makes her extremely hyper  . Amoxicillin Hives, Swelling and Rash    Has patient had a PCN reaction causing immediate rash, facial/tongue/throat swelling, SOB or lightheadedness with hypotension: Unsure Has patient had a PCN reaction causing severe rash involving mucus membranes or skin necrosis: Unsure Has patient had a PCN reaction that required hospitalization No Has patient had a PCN reaction occurring within the last 10 years: No If all of the above answers are "NO", then may proceed with Cephalosporin use.REACTION: facial swelling and rash.    No medications prior to admission.    Review of Systems  Constitutional: Negative.   HENT: Negative.   Eyes: Negative.   Respiratory: Negative.   Gastrointestinal: Positive for abdominal pain.  Genitourinary: Negative.   Musculoskeletal: Negative.   Skin: Negative.   Neurological: Negative.   Psychiatric/Behavioral: Negative.     Last menstrual period 11/03/2016. Physical Exam  Constitutional: She is oriented to person, place, and time. She appears well-developed and well-nourished.  HENT:  Head: Normocephalic and atraumatic.  Cardiovascular:  Normal rate and regular rhythm.  Respiratory: Breath sounds normal. No respiratory distress. She has no wheezes.  GI: Soft. Bowel sounds are normal. She exhibits no distension. There is tenderness.  Musculoskeletal: Normal range of motion.  Neurological: She is alert and oriented to person, place, and time.  Skin: Skin is warm and dry.  Psychiatric: She has a normal mood and affect. Her behavior is normal.    No results found for this or any previous visit (from the past 24 hour(s)).  No results found.  Assessment/Plan: 37yo with RPOC after TAB for D&E Ancef for prophylaxis given  multiple allergies Has been NPO D/w pt r/b/a  Amanda [name] 03/30/2017, 9:57 AM

## 2017-03-30 NOTE — Brief Op Note (Signed)
03/30/2017  1:35 PM  PATIENT:  Amanda [name]  37 y.o. female  PRE-OPERATIVE DIAGNOSIS:  MAB, RPOC, pain  POST-OPERATIVE DIAGNOSIS:  MAB, RPOC, pain  PROCEDURE:  Procedure(s): DILATATION AND EVACUATION (N/A) US guidance  SURGEON:  Surgeon(s) and Role:    * Bovard-Stuckert, Aleesha Ringstad, MD - Primary  ANESTHESIA:   MAC  EBL:  50cc   BLOOD ADMINISTERED:none  DRAINS: none   LOCAL MEDICATIONS USED: Lidocaine    SPECIMEN:  Source of Specimen:  POC  DISPOSITION OF SPECIMEN:  PATHOLOGY  COUNTS:  YES  TOURNIQUET:  * No tourniquets in log *  DICTATION: .Other Dictation: Dictation Number [id number]  PLAN OF CARE: Discharge to home after PACU  PATIENT DISPOSITION:  PACU - hemodynamically stable.   Delay start of Pharmacological VTE agent (>24hrs) due to surgical blood loss or risk of bleeding: not applicable

## 2017-03-31 ENCOUNTER — Encounter (HOSPITAL_COMMUNITY): Payer: Self-pay | Admitting: Obstetrics and Gynecology

## 2017-03-31 NOTE — Op Note (Signed)
[name], Amanda [name]       ACCOUNT NO.:  [id number]  MEDICAL RECORD NO.:  [id number]  LOCATION:                                 FACILITY:  PHYSICIAN:  Thornell Sartorius, MD        DATE OF BIRTH:  17-Jul-1980  DATE OF PROCEDURE:  03/30/2017 DATE OF DISCHARGE:  03/30/2017                              OPERATIVE REPORT   PREOPERATIVE DIAGNOSES:  Status post therapeutic abortion, retained products of conception, pain, irregular bleeding.  POSTOPERATIVE DIAGNOSES:  Status post therapeutic abortion, retained products of conception, pain, irregular bleeding, status post D and C with ultrasound guidance.  PROCEDURE:  D and E with ultrasound guidance.  SURGEON:  Thornell Sartorius, MD  ANESTHESIA:  MAC.  EBL:  50 cc.  URINE OUTPUT:  By I and O cath.  IV FLUIDS:  Per anesthesia.  COMPLICATIONS:  None.  PATHOLOGY:  Products of conception to Pathology.  DESCRIPTION OF PROCEDURE:  After informed consent was reviewed with the patient including risks, benefits and alternatives of the surgical procedure, she was transported to the room, placed on the table in supine position.  She was then placed in the [place].  MAC anesthesia was introduced.  She was then prepped and draped.  Using ultrasound guidance, her uterus appeared to have some retained products at the fundus.  Her paracervical block was placed with 20 cc of 2% lidocaine.  Her cervix was grasped with a single-tooth tenaculum and dilated to accommodate an 8-French curette.  Several passes were made and then passed with a [place] curette as well, and a final pass with the suction curette.  There was difficulty appreciating the stripe; therefore, a transvaginal ultrasound was performed revealing a thin endometrial stripe.  The patient was awakened in stable condition, transferred to the PACU.  Per the OR staff, the counts were correct.     Thornell Sartorius, MD     JB/MEDQ  D:  03/30/2017  T:  03/31/2017  Job:   [id number]

## 2017-04-07 ENCOUNTER — Telehealth: Payer: Self-pay | Admitting: Internal Medicine

## 2017-04-07 NOTE — Telephone Encounter (Signed)
Received fax from [facility] [place] Note, fax will on the pcp in-box

## 2017-07-24 DIAGNOSIS — O09529 Supervision of elderly multigravida, unspecified trimester: Secondary | ICD-10-CM | POA: Insufficient documentation

## 2017-07-24 DIAGNOSIS — K5900 Constipation, unspecified: Secondary | ICD-10-CM | POA: Insufficient documentation

## 2017-07-24 DIAGNOSIS — D573 Sickle-cell trait: Secondary | ICD-10-CM | POA: Insufficient documentation

## 2017-07-24 DIAGNOSIS — O99210 Obesity complicating pregnancy, unspecified trimester: Secondary | ICD-10-CM | POA: Insufficient documentation

## 2017-07-24 LAB — OB RESULTS CONSOLE HEPATITIS B SURFACE ANTIGEN: HEP B S AG: NEGATIVE

## 2017-07-24 LAB — OB RESULTS CONSOLE GC/CHLAMYDIA
Chlamydia: NEGATIVE
Gonorrhea: NEGATIVE

## 2017-07-24 LAB — OB RESULTS CONSOLE ABO/RH: RH Type: POSITIVE

## 2017-07-24 LAB — OB RESULTS CONSOLE RPR: RPR: NONREACTIVE

## 2017-07-24 LAB — OB RESULTS CONSOLE HIV ANTIBODY (ROUTINE TESTING): HIV: NONREACTIVE

## 2017-07-24 LAB — OB RESULTS CONSOLE ANTIBODY SCREEN: Antibody Screen: NEGATIVE

## 2017-07-24 LAB — OB RESULTS CONSOLE RUBELLA ANTIBODY, IGM: RUBELLA: IMMUNE

## 2017-09-02 ENCOUNTER — Inpatient Hospital Stay (HOSPITAL_COMMUNITY)
Admission: AD | Admit: 2017-09-02 | Discharge: 2017-09-03 | Disposition: A | Discharge status: Home / Self Care | Payer: Medicaid Other | Source: Ambulatory Visit | Attending: Obstetrics and Gynecology | Admitting: Obstetrics and Gynecology

## 2017-09-02 ENCOUNTER — Encounter (HOSPITAL_COMMUNITY): Payer: Self-pay | Admitting: *Deleted

## 2017-09-02 DIAGNOSIS — R110 Nausea: Secondary | ICD-10-CM

## 2017-09-02 DIAGNOSIS — Z3A16 16 weeks gestation of pregnancy: Secondary | ICD-10-CM | POA: Diagnosis not present

## 2017-09-02 DIAGNOSIS — O99352 Diseases of the nervous system complicating pregnancy, second trimester: Secondary | ICD-10-CM | POA: Insufficient documentation

## 2017-09-02 DIAGNOSIS — G43019 Migraine without aura, intractable, without status migrainosus: Secondary | ICD-10-CM | POA: Insufficient documentation

## 2017-09-02 DIAGNOSIS — O9989 Other specified diseases and conditions complicating pregnancy, childbirth and the puerperium: Secondary | ICD-10-CM | POA: Diagnosis not present

## 2017-09-02 MED ORDER — LACTATED RINGERS IV BOLUS (SEPSIS)
1000.0000 mL | Freq: Once | INTRAVENOUS | Status: AC
Start: 2017-09-03 — End: 2017-09-03
  Administered 2017-09-03: 1000 mL via INTRAVENOUS

## 2017-09-02 MED ORDER — DEXAMETHASONE SODIUM PHOSPHATE 10 MG/ML IJ SOLN
10.0000 mg | Freq: Once | INTRAMUSCULAR | Status: AC
  Administered 2017-09-03: 10 mg via INTRAVENOUS
  Filled 2017-09-02: qty 1

## 2017-09-02 MED ORDER — DIPHENHYDRAMINE HCL 50 MG/ML IJ SOLN
25.0000 mg | Freq: Once | INTRAMUSCULAR | Status: AC
  Administered 2017-09-03: 25 mg via INTRAVENOUS
  Filled 2017-09-02: qty 1

## 2017-09-02 MED ORDER — METOCLOPRAMIDE HCL 5 MG/ML IJ SOLN
10.0000 mg | Freq: Once | INTRAMUSCULAR | Status: AC
  Administered 2017-09-03: 10 mg via INTRAVENOUS
  Filled 2017-09-02: qty 2

## 2017-09-02 NOTE — MAU Provider Note (Signed)
History     CSN: [id number]  Arrival date and time: 09/02/17 2307   First Provider Initiated Contact with Patient 09/02/17 2350      Chief Complaint  Patient presents with  . Headache  . Nausea   HPI   Amanda [name] is a 37 y.o. female [phone] @ [redacted]w[redacted]d here in MAU with a migraine HA. Symptoms started 4 days ago. Symptoms are consistent with migraine pain she has had in the past. The pain is in the front of her head and radiates down her neck. + nausea. She has been taking tylenol every 6 hours with no relief.   OB History    Gravida  5   Para  1   Term  1   Preterm  0   AB  2   Living  1     SAB  2   TAB  0   Ectopic  0   Multiple  0   Live Births  1           Past Medical History:  Diagnosis Date  . Anginal pain ([place])    r/t anxiety  . Anxiety   . Asthma    no inhaler - seasonal  . Constipation   . Depression   . Dysrhythmia    Palpatations - r/t anxiety  . Genital warts   . GERD (gastroesophageal reflux disease)   . H/O hiatal hernia    surgery to repair  . Headache(784.0)   . Heart murmur   . MRSA cellulitis    greater than 10 yrs ago dx at urgent care   . Sickle cell trait [facility])     Past Surgical History:  Procedure Laterality Date  . CERVICAL CONE BIOPSY    . CESAREAN SECTION  01/20/2012   Procedure: CESAREAN SECTION;  Surgeon: Melina Schools, MD;  Location: [facility];  Service: Obstetrics;  Laterality: N/A;  . COLONOSCOPY    . DILATION AND EVACUATION N/A 03/30/2017   Procedure: DILATATION AND EVACUATION WITH ULTRASOUND GUIDANCE;  Surgeon: Janyth Contes, MD;  Location: [facility];  Service: Gynecology;  Laterality: N/A;  . HERNIA REPAIR    . INSERTION OF MESH N/A 05/15/2015   Procedure: INSERTION OF MESH;  Surgeon: Ralene Ok, MD;  Location: [facility];  Service: General;  Laterality: N/A;  . MARSUPIALIZATION URETHRAL DIVERTICULUM    . UMBILICAL HERNIA REPAIR N/A 05/15/2015   Procedure: LAPAROSCOPIC UMBILICAL HERNIA;   Surgeon: Ralene Ok, MD;  Location: [facility];  Service: General;  Laterality: N/A;  . UPPER GI ENDOSCOPY    . WISDOM TOOTH EXTRACTION      Family History  Problem Relation Age of Onset  . Diabetes Mother   . Hypertension Mother   . Depression Mother   . Depression Father   . Heart disease Maternal Grandmother   . Breast cancer Maternal Aunt        [employer]  . Cancer Maternal Aunt   . Diabetes Maternal Grandfather   . Heart disease Maternal Grandfather   . Hypertension Maternal Grandfather   . Anesthesia problems Neg Hx     Social History   Tobacco Use  . Smoking status: Never Smoker  . Smokeless tobacco: Never Used  Substance Use Topics  . Alcohol use: No    Alcohol/week: 0.0 standard drinks  . Drug use: No    Allergies:  Allergies  Allergen Reactions  . Doxycycline Hyclate Hives, Itching and Swelling    REACTION: swelling of face  .  Ketorolac Tromethamine Other (See Comments)    Patient stated that it makes her extremely hyper  . Amoxicillin Hives, Swelling and Rash    Has patient had a PCN reaction causing immediate rash, facial/tongue/throat swelling, SOB or lightheadedness with hypotension: Unsure Has patient had a PCN reaction causing severe rash involving mucus membranes or skin necrosis: Unsure Has patient had a PCN reaction that required hospitalization No Has patient had a PCN reaction occurring within the last 10 years: No If all of the above answers are "NO", then may proceed with Cephalosporin use.REACTION: facial swelling and rash.    Medications Prior to Admission  Medication Sig Dispense Refill Last Dose  . cyclobenzaprine (FLEXERIL) 10 MG tablet Take 10 mg by mouth every 8 (eight) hours as needed for muscle spasms (Migraines).   03/29/2017 at Unknown time  . docusate sodium (COLACE) 100 MG capsule Take 1 capsule (100 mg total) by mouth every 12 (twelve) hours. 60 capsule 0 More than a month at Unknown time  . glycopyrrolate (ROBINUL) 1 MG tablet  Take 1 tablet (1 mg total) by mouth 3 (three) times daily. 90 tablet 0 More than a month at Unknown time  . hydrOXYzine (ATARAX/VISTARIL) 25 MG tablet Take 25 mg by mouth every 8 (eight) hours as needed for anxiety.   Past Week at Unknown time  . ibuprofen (ADVIL,MOTRIN) 800 MG tablet Take 1 tablet (800 mg total) by mouth every 8 (eight) hours as needed. 45 tablet 1   . ondansetron (ZOFRAN ODT) 8 MG disintegrating tablet Take 1 tablet (8 mg total) by mouth every 8 (eight) hours as needed for nausea or vomiting. 20 tablet 0 More than a month at Unknown time  . oxyCODONE-acetaminophen (PERCOCET/ROXICET) 5-325 MG tablet Take 1-2 tablets by mouth daily as needed for severe pain.   03/29/2017 at Unknown time  . promethazine (PHENERGAN) 25 MG tablet Take 0.5-1 tablets (12.5-25 mg total) by mouth every 6 (six) hours as needed. 30 tablet 0 More than a month at Unknown time  . ranitidine (ZANTAC) 150 MG capsule Take 1 capsule (150 mg total) by mouth 2 (two) times daily. 60 capsule 1 More than a month at Unknown time   Results for orders placed or performed during the hospital encounter of 09/02/17 (from the past 48 hour(s))  Urinalysis, Routine w reflex microscopic     Status: Abnormal   Collection Time: 09/02/17 11:40 PM  Result Value Ref Range   Color, Urine STRAW (A) YELLOW   APPearance CLEAR CLEAR   Specific Gravity, Urine 1.010 1.005 - 1.030   pH 6.0 5.0 - 8.0   Glucose, UA NEGATIVE NEGATIVE mg/dL   Hgb urine dipstick NEGATIVE NEGATIVE   Bilirubin Urine NEGATIVE NEGATIVE   Ketones, ur NEGATIVE NEGATIVE mg/dL   Protein, ur NEGATIVE NEGATIVE mg/dL   Nitrite NEGATIVE NEGATIVE   Leukocytes, UA NEGATIVE NEGATIVE    Comment: Performed at [facility], [address] [place],  [address]   Review of Systems  Eyes: Negative for photophobia and visual disturbance.  Cardiovascular: Negative for chest pain.  Gastrointestinal: Positive for nausea. Negative for abdominal pain and vomiting.   Neurological: Positive for headaches. Negative for seizures and facial asymmetry.   Physical Exam   Blood pressure (!) 122/53, pulse 91, temperature 98.1 F (36.7 C), resp. rate 18, height 5\' 3"  (1.6 m), weight 122.9 kg, last menstrual period 11/03/2016, unknown if currently breastfeeding.  Physical Exam  MAU Course  Procedures  None  MDM  + fetal heart  tones via doppler  Lr Bolus with HA cocktail: Benadryl, decadron, Reglan IV Patient called out saying she is ready for her IV out and is ready to go home Dr. Melba Coon called however in the OR. Relayed message that patient is wanting to leave now. OK for DC Home per Dr. Melba Coon.  HA pain 0/10  Assessment and Plan   A:  1. Intractable migraine without aura and without status migrainosus   2. [redacted] weeks gestation of pregnancy   3. Nausea     P:  Discharge home in stable condition  If symptoms worsen go to [facility] F/u with Dr. Melba Coon in the office Return to MAU for OBGYN emergencies Increase oral fluid intake.   Noni Saupe I, NP  09/03/2017   4:21 AM

## 2017-09-02 NOTE — MAU Note (Signed)
Hx migraines. Usually uses Botox, nasal [name] and Flexeril. Has had this  H/a off and on for 3 wks but worse tonight. Nauseated but has not vomited today. Taking Tylenol q6hrs for past 4 days.

## 2017-09-03 DIAGNOSIS — Z3A16 16 weeks gestation of pregnancy: Secondary | ICD-10-CM

## 2017-09-03 DIAGNOSIS — O9989 Other specified diseases and conditions complicating pregnancy, childbirth and the puerperium: Secondary | ICD-10-CM

## 2017-09-03 DIAGNOSIS — G43019 Migraine without aura, intractable, without status migrainosus: Secondary | ICD-10-CM

## 2017-09-03 DIAGNOSIS — G43909 Migraine, unspecified, not intractable, without status migrainosus: Secondary | ICD-10-CM | POA: Insufficient documentation

## 2017-09-03 LAB — URINALYSIS, ROUTINE W REFLEX MICROSCOPIC
Bilirubin Urine: NEGATIVE
GLUCOSE, UA: NEGATIVE mg/dL
HGB URINE DIPSTICK: NEGATIVE
Ketones, ur: NEGATIVE mg/dL
LEUKOCYTES UA: NEGATIVE
Nitrite: NEGATIVE
PH: 6 (ref 5.0–8.0)
Protein, ur: NEGATIVE mg/dL
Specific Gravity, Urine: 1.01 (ref 1.005–1.030)

## 2017-09-03 NOTE — Progress Notes (Signed)
Altamease Oiler NP in earlier to discuss d/c plan. Written and verbal d/c instructions given and understanding voiced.

## 2017-09-03 NOTE — Discharge Instructions (Signed)
Migraine Headache A migraine headache is an intense, throbbing pain on one side or both sides of the head. Migraines may also cause other symptoms, such as nausea, vomiting, and sensitivity to light and noise. What are the causes? Doing or taking certain things may also trigger migraines, such as:  Alcohol.  Smoking.  Medicines, such as: ? Medicine used to treat chest pain (nitroglycerine). ? Birth control pills. ? Estrogen pills. ? Certain blood pressure medicines.  Aged cheeses, chocolate, or caffeine.  Foods or drinks that contain nitrates, glutamate, aspartame, or tyramine.  Physical activity.  Other things that may trigger a migraine include:  Menstruation.  Pregnancy.  Hunger.  Stress, lack of sleep, too much sleep, or fatigue.  Weather changes.  What increases the risk? The following factors may make you more likely to experience migraine headaches:  Age. Risk increases with age.  Family history of migraine headaches.  Being Caucasian.  Depression and anxiety.  Obesity.  Being a woman.  Having a hole in the heart (patent foramen ovale) or other heart problems.  What are the signs or symptoms? The main symptom of this condition is pulsating or throbbing pain. Pain may:  Happen in any area of the head, such as on one side or both sides.  Interfere with daily activities.  Get worse with physical activity.  Get worse with exposure to bright lights or loud noises.  Other symptoms may include:  Nausea.  Vomiting.  Dizziness.  General sensitivity to bright lights, loud noises, or smells.  Before you get a migraine, you may get warning signs that a migraine is developing (aura). An aura may include:  Seeing flashing lights or having blind spots.  Seeing bright spots, halos, or zigzag lines.  Having tunnel vision or blurred vision.  Having numbness or a tingling feeling.  Having trouble talking.  Having muscle weakness.  How is this  diagnosed? A migraine headache can be diagnosed based on:  Your symptoms.  A physical exam.  Tests, such as CT scan or MRI of the head. These imaging tests can help rule out other causes of headaches.  Taking fluid from the spine (lumbar puncture) and analyzing it (cerebrospinal fluid analysis, or CSF analysis).  How is this treated? A migraine headache is usually treated with medicines that:  Relieve pain.  Relieve nausea.  Prevent migraines from coming back.  Treatment may also include:  Acupuncture.  Lifestyle changes like avoiding foods that trigger migraines.  Follow these instructions at home: Medicines  Take over-the-counter and prescription medicines only as told by your health care provider.  Do not drive or use heavy machinery while taking prescription pain medicine.  To prevent or treat constipation while you are taking prescription pain medicine, your health care provider may recommend that you: ? Drink enough fluid to keep your urine clear or pale yellow. ? Take over-the-counter or prescription medicines. ? Eat foods that are high in fiber, such as fresh fruits and vegetables, whole grains, and beans. ? Limit foods that are high in fat and processed sugars, such as fried and sweet foods. Lifestyle  Avoid alcohol use.  Do not use any products that contain nicotine or tobacco, such as cigarettes and e-cigarettes. If you need help quitting, ask your health care provider.  Get at least 8 hours of sleep every night.  Limit your stress. General instructions   Keep a journal to find out what may trigger your migraine headaches. For example, write down: ? What you eat and   drink. ? How much sleep you get. ? Any change to your diet or medicines.  If you have a migraine: ? Avoid things that make your symptoms worse, such as bright lights. ? It may help to lie down in a dark, quiet room. ? Do not drive or use heavy machinery. ? Ask your health care provider  what activities are safe for you while you are experiencing symptoms.  Keep all follow-up visits as told by your health care provider. This is important. Contact a health care provider if:  You develop symptoms that are different or more severe than your usual migraine symptoms. Get help right away if:  Your migraine becomes severe.  You have a fever.  You have a stiff neck.  You have vision loss.  Your muscles feel weak or like you cannot control them.  You start to lose your balance often.  You develop trouble walking.  You faint. This information is not intended to replace advice given to you by your health care provider. Make sure you discuss any questions you have with your health care provider. Document Released: 01/10/2005 Document Revised: 07/31/2015 Document Reviewed: 06/29/2015 Elsevier Interactive Patient Education  2017 Elsevier Inc.   

## 2017-09-03 NOTE — MAU Note (Signed)
Pt called nurse in and wants IV out and to go home. Feels better and just wants to go home and get in bed. J Rasch NP notified. OK to d/c IV and NP will come talk to pt

## 2017-10-12 ENCOUNTER — Inpatient Hospital Stay (HOSPITAL_COMMUNITY)
Admission: AD | Admit: 2017-10-12 | Discharge: 2017-10-12 | Disposition: A | Discharge status: Home / Self Care | Payer: Medicaid Other | Source: Ambulatory Visit | Attending: Obstetrics and Gynecology | Admitting: Obstetrics and Gynecology

## 2017-10-12 ENCOUNTER — Other Ambulatory Visit: Payer: Self-pay

## 2017-10-12 ENCOUNTER — Encounter (HOSPITAL_COMMUNITY): Payer: Self-pay

## 2017-10-12 DIAGNOSIS — Z880 Allergy status to penicillin: Secondary | ICD-10-CM | POA: Diagnosis not present

## 2017-10-12 DIAGNOSIS — Z3A21 21 weeks gestation of pregnancy: Secondary | ICD-10-CM | POA: Insufficient documentation

## 2017-10-12 DIAGNOSIS — O26892 Other specified pregnancy related conditions, second trimester: Secondary | ICD-10-CM | POA: Insufficient documentation

## 2017-10-12 DIAGNOSIS — R102 Pelvic and perineal pain: Secondary | ICD-10-CM | POA: Insufficient documentation

## 2017-10-12 LAB — URINALYSIS, ROUTINE W REFLEX MICROSCOPIC
Bilirubin Urine: NEGATIVE
Glucose, UA: NEGATIVE mg/dL
HGB URINE DIPSTICK: NEGATIVE
Ketones, ur: 80 mg/dL — AB
LEUKOCYTES UA: NEGATIVE
Nitrite: NEGATIVE
PROTEIN: NEGATIVE mg/dL
Specific Gravity, Urine: 1.014 (ref 1.005–1.030)
pH: 5 (ref 5.0–8.0)

## 2017-10-12 LAB — WET PREP, GENITAL
Clue Cells Wet Prep HPF POC: NONE SEEN
SPERM: NONE SEEN
TRICH WET PREP: NONE SEEN
Yeast Wet Prep HPF POC: NONE SEEN

## 2017-10-12 NOTE — MAU Provider Note (Addendum)
History     CSN: [id number]  Arrival date and time: 10/12/17 1405   First Provider Initiated Contact with Patient 10/12/17 1512      Chief Complaint  Patient presents with  . Pelvic Pain   HPI Amanda [name] 37 y.o. [redacted]w[redacted]d  Comes to MAU today with pelvic pain since 8 pm last night.  No vaginal bleeding or leaking but feeling pain and pressure especially when walking feels like the baby is coming out.  Did not sleep last night and was in court today so she has not slept.  Tearful.  Is taking Tylenol every 6 hours.  Has ptyalism.  Takes Diclegis at night and if she does not, she will have vomiting in the morning but it does not continue through the day.  OB History    Gravida  5   Para  1   Term  1   Preterm  0   AB  2   Living  1     SAB  2   TAB  0   Ectopic  0   Multiple  0   Live Births  1          Patient Active Problem List   Diagnosis Date Noted  . 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 Morning sickness 01/05/2017  . Abdominal pain affecting pregnancy 01/05/2017  . Suprapubic pain 01/05/2017  . Bacterial vaginitis 12/08/2016  . Low back pain 12/16/2015  . H. pylori infection 11/02/2015  . Intractable chronic migraine without aura and without status migrainosus 03/19/2015  . Morbid obesity ([place]) 03/19/2015  . Murmur 06/20/2012  . INSOMNIA 08/14/2009  . DYSMENORRHEA 12/09/2008  . FATIGUE 03/25/2008  . DEPRESSION/ANXIETY 09/11/2007  . HEADACHE 09/11/2007  . PALPITATIONS 02/23/2007  . CHEST PAIN 02/23/2007  . ALLERGIC RHINITIS 10/06/2006  . ASTHMA 10/06/2006  . GERD 10/06/2006     Past Medical History:  Diagnosis Date  . Anginal pain ([place])    r/t anxiety  . Anxiety   . Asthma    no inhaler - seasonal  . Constipation   . Depression   . Dysrhythmia    Palpatations - r/t anxiety  . Genital warts   . GERD (gastroesophageal reflux disease)   . H/O hiatal hernia    surgery to repair  . Headache(784.0)   . Heart murmur   . MRSA cellulitis    greater than 10 yrs ago dx at urgent care   . Sickle cell trait [facility])     Past Surgical History:  Procedure Laterality Date  . CERVICAL CONE BIOPSY    . CESAREAN SECTION  01/20/2012   Procedure: CESAREAN SECTION;  Surgeon: Melina Schools, MD;  Location: [facility];  Service: Obstetrics;  Laterality: N/A;  . COLONOSCOPY    . DILATION AND EVACUATION N/A 03/30/2017   Procedure: DILATATION AND EVACUATION WITH ULTRASOUND GUIDANCE;  Surgeon: Janyth Contes, MD;  Location: [facility];  Service: Gynecology;  Laterality: N/A;  . HERNIA REPAIR    . INSERTION OF MESH N/A 05/15/2015   Procedure: INSERTION OF MESH;  Surgeon: Ralene Ok, MD;  Location: [facility];  Service: General;  Laterality: N/A;  . MARSUPIALIZATION URETHRAL DIVERTICULUM    . UMBILICAL HERNIA REPAIR N/A 05/15/2015   Procedure: LAPAROSCOPIC UMBILICAL HERNIA;  Surgeon: Ralene Ok, MD;  Location: [facility];  Service: General;  Laterality: N/A;  . UPPER GI ENDOSCOPY    . WISDOM TOOTH EXTRACTION      Family History  Problem Relation Age of Onset  .  Diabetes Mother   . Hypertension Mother   . Depression Mother   . Depression Father   . Heart disease Maternal Grandmother   . Breast cancer Maternal Aunt        [employer]  . Cancer Maternal Aunt   . Diabetes Maternal Grandfather   . Heart disease Maternal Grandfather   . Hypertension Maternal Grandfather   . Anesthesia problems Neg Hx     Social History   Tobacco Use  . Smoking status: Never Smoker  . Smokeless tobacco: Never Used  Substance Use Topics  . Alcohol use: No    Alcohol/week: 0.0 standard drinks  . Drug use: No    Allergies:  Allergies  Allergen Reactions  . Doxycycline Hyclate Hives, Itching and Swelling    REACTION: swelling of face  . Ketorolac Tromethamine Other (See Comments)    Patient stated that it makes her extremely hyper  . Amoxicillin Hives, Swelling and Rash    Has patient had a PCN reaction causing immediate rash, facial/tongue/throat  swelling, SOB or lightheadedness with hypotension: Unsure Has patient had a PCN reaction causing severe rash involving mucus membranes or skin necrosis: Unsure Has patient had a PCN reaction that required hospitalization No Has patient had a PCN reaction occurring within the last 10 years: No If all of the above answers are "NO", then may proceed with Cephalosporin use.REACTION: facial swelling and rash.    No medications prior to admission.    Review of Systems  Gastrointestinal: Positive for abdominal pain, nausea and vomiting.  Genitourinary: Positive for pelvic pain. Negative for dysuria, vaginal bleeding and vaginal discharge.       Pelvic pain   Physical Exam   Blood pressure 135/67, pulse 94, temperature 98.1 F (36.7 C), temperature source Oral, weight 122 kg, last menstrual period 11/03/2016, SpO2 100 %, unknown if currently breastfeeding.  Physical Exam  Nursing note and vitals reviewed. Constitutional: She is oriented to person, place, and time. She appears well-developed and well-nourished.  HENT:  Head: Normocephalic.  Eyes: EOM are normal.  Neck: Neck supple.  GI: Soft. There is no tenderness. There is no rebound and no guarding.  Genitourinary:  Genitourinary Comments: Speculum exam: Vagina - Small amount of white discharge, no odor, pain with speculum exam Cervix - No contact bleeding Bimanual exam: Cervix closed and presenting part is very high inside. GC/Chlam, wet prep done Chaperone present for exam.   Musculoskeletal: Normal range of motion.  Neurological: She is alert and oriented to person, place, and time.  Skin: Skin is warm and dry.  Psychiatric: She has a normal mood and affect.    MAU Course  Procedures Results for orders placed or performed during the hospital encounter of 10/12/17 (from the past 24 hour(s))  Wet prep, genital     Status: Abnormal   Collection Time: 10/12/17  3:26 PM  Result Value Ref Range   Yeast Wet Prep HPF POC NONE  SEEN NONE SEEN   Trich, Wet Prep NONE SEEN NONE SEEN   Clue Cells Wet Prep HPF POC NONE SEEN NONE SEEN   WBC, Wet Prep HPF POC MODERATE (A) NONE SEEN   Sperm NONE SEEN   Urinalysis, Routine w reflex microscopic     Status: Abnormal   Collection Time: 10/12/17  4:32 PM  Result Value Ref Range   Color, Urine YELLOW YELLOW   APPearance CLEAR CLEAR   Specific Gravity, Urine 1.014 1.005 - 1.030   pH 5.0 5.0 - 8.0  Glucose, UA NEGATIVE NEGATIVE mg/dL   Hgb urine dipstick NEGATIVE NEGATIVE   Bilirubin Urine NEGATIVE NEGATIVE   Ketones, ur 80 (A) NEGATIVE mg/dL   Protein, ur NEGATIVE NEGATIVE mg/dL   Nitrite NEGATIVE NEGATIVE   Leukocytes, UA NEGATIVE NEGATIVE    MDM Has had one episode of cramping during the time while she was in MAU today - in past 2.5 hours. Consult with Dr. Tajae Rybicki Piedra and reviewed the plan of care. Evaluated for possible preterm labor symptoms.  No vaginal infection found today.  Assessment and Plan  Pelvic pain but no UTI and cervix is closed.  Plan Be sure to get some sleep. Continue the tylenol as you have been doing. Drink at least 8 8-oz glasses of water every day. Call the office if the pain is worsening - today your cervix is closed. You can use a pregnancy support band. Return if you are having vaginal bleeding or leaking of fluid. Discussed all of the above with client and she is content to go home and get some sleep. Plans to get pregnancy support band,    Virginia Rochester 10/12/2017, 5:33 PM

## 2017-10-12 NOTE — Discharge Instructions (Signed)
Be sure to get some sleep. Continue the tylenol as you have been doing. Drink at least 8 8-oz glasses of water every day. Call the office if the pain is worsening - today your cervix is closed. You can use a pregnancy support band. Return if you are having vaginal bleeding or leaking of fluid.

## 2017-10-12 NOTE — MAU Note (Addendum)
Pt states feels like she's having CTX, but describes as severe period cramps. "Having a lot of pelvic pressure that gets worse when laying down". Cramping comes and goes, not as consistent as last night. Says when she stands up, feels like "baby is going to drop out". Feeling nauseous. Last sexual intercourse "a few weeks ago".  States no problems with previous pregnancy.

## 2017-10-13 LAB — GC/CHLAMYDIA PROBE AMP (~~LOC~~) NOT AT ARMC
Chlamydia: NEGATIVE
NEISSERIA GONORRHEA: NEGATIVE

## 2018-01-19 LAB — OB RESULTS CONSOLE GBS: GBS: POSITIVE

## 2018-01-31 ENCOUNTER — Encounter (HOSPITAL_COMMUNITY): Payer: Self-pay

## 2018-02-07 ENCOUNTER — Inpatient Hospital Stay (HOSPITAL_COMMUNITY)
Admission: AD | Admit: 2018-02-07 | Discharge: 2018-02-07 | DRG: 832 | Discharge status: Against Medical Advice | Payer: Medicaid Other | Attending: Obstetrics and Gynecology | Admitting: Obstetrics and Gynecology

## 2018-02-07 ENCOUNTER — Encounter (HOSPITAL_COMMUNITY): Payer: Self-pay

## 2018-02-07 ENCOUNTER — Inpatient Hospital Stay (HOSPITAL_BASED_OUTPATIENT_CLINIC_OR_DEPARTMENT_OTHER): Payer: Medicaid Other

## 2018-02-07 ENCOUNTER — Other Ambulatory Visit: Payer: Self-pay

## 2018-02-07 DIAGNOSIS — Z3A38 38 weeks gestation of pregnancy: Secondary | ICD-10-CM

## 2018-02-07 DIAGNOSIS — O99213 Obesity complicating pregnancy, third trimester: Secondary | ICD-10-CM | POA: Diagnosis present

## 2018-02-07 DIAGNOSIS — O34219 Maternal care for unspecified type scar from previous cesarean delivery: Secondary | ICD-10-CM | POA: Diagnosis present

## 2018-02-07 DIAGNOSIS — Z5329 Procedure and treatment not carried out because of patient's decision for other reasons: Secondary | ICD-10-CM | POA: Diagnosis not present

## 2018-02-07 DIAGNOSIS — E669 Obesity, unspecified: Secondary | ICD-10-CM | POA: Diagnosis present

## 2018-02-07 DIAGNOSIS — D573 Sickle-cell trait: Secondary | ICD-10-CM | POA: Diagnosis present

## 2018-02-07 DIAGNOSIS — O368190 Decreased fetal movements, unspecified trimester, not applicable or unspecified: Secondary | ICD-10-CM | POA: Diagnosis present

## 2018-02-07 DIAGNOSIS — O99013 Anemia complicating pregnancy, third trimester: Secondary | ICD-10-CM | POA: Diagnosis present

## 2018-02-07 DIAGNOSIS — O368130 Decreased fetal movements, third trimester, not applicable or unspecified: Secondary | ICD-10-CM | POA: Diagnosis present

## 2018-02-07 DIAGNOSIS — O99113 Other diseases of the blood and blood-forming organs and certain disorders involving the immune mechanism complicating pregnancy, third trimester: Secondary | ICD-10-CM | POA: Diagnosis present

## 2018-02-07 DIAGNOSIS — O288 Other abnormal findings on antenatal screening of mother: Secondary | ICD-10-CM

## 2018-02-07 DIAGNOSIS — D696 Thrombocytopenia, unspecified: Secondary | ICD-10-CM | POA: Diagnosis present

## 2018-02-07 LAB — TYPE AND SCREEN
ABO/RH(D): O POS
ANTIBODY SCREEN: NEGATIVE

## 2018-02-07 LAB — CBC
HCT: 34.9 % — ABNORMAL LOW (ref 36.0–46.0)
Hemoglobin: 11.4 g/dL — ABNORMAL LOW (ref 12.0–15.0)
MCH: 25.2 pg — AB (ref 26.0–34.0)
MCHC: 32.7 g/dL (ref 30.0–36.0)
MCV: 77 fL — ABNORMAL LOW (ref 80.0–100.0)
Platelets: 116 10*3/uL — ABNORMAL LOW (ref 150–400)
RBC: 4.53 MIL/uL (ref 3.87–5.11)
RDW: 15.2 % (ref 11.5–15.5)
WBC: 10 10*3/uL (ref 4.0–10.5)
nRBC: 0 % (ref 0.0–0.2)

## 2018-02-07 LAB — URINALYSIS, ROUTINE W REFLEX MICROSCOPIC
BILIRUBIN URINE: NEGATIVE
GLUCOSE, UA: NEGATIVE mg/dL
Hgb urine dipstick: NEGATIVE
KETONES UR: 80 mg/dL — AB
LEUKOCYTES UA: NEGATIVE
Nitrite: NEGATIVE
PH: 7 (ref 5.0–8.0)
PROTEIN: NEGATIVE mg/dL
Specific Gravity, Urine: 1.009 (ref 1.005–1.030)

## 2018-02-07 NOTE — MAU Note (Signed)
Pt states reports pelvic pain and that the baby is not moving the way that he normally does.   She last felt a small movement in the lobby.   Denies vaginal bleeding, or LOF.

## 2018-02-07 NOTE — Progress Notes (Signed)
Banga at bedside discussing plan of care which involved overnight observation with a minimum of 6 hrs. Pt stated that she wanted a c-section tonight because she was feeling decreased fetal movement and was worried. Dr. Terri Piedra explained that there needed to be a medical reason like fetal distress or labor to perform a c-section before 39 weeks none of which were currently present. Dr. performed cervical exam.  Pt asked Banga to leave. Banga left pt room. Pt stated "I'm allowed to leave. I'm leaving". Pt began taking off gown and putting on regular clothes. RN requested pt to sign AMA paper before leaving room and to allow the RN to remove the IV, pt agreed. Upon returning with the [place] form, the RN found the patient was not in the room and the IV was intact and in the trash can. RN walked to MAU, pt was walking out of MAU entrance. RN called pt name, pt kept walking out of building. Pt was on the monitor for 60 minutes. Baseline 140 bpm. Accels present. No decels. Irregular, mild contractions. Cervical exam 0 cm.

## 2018-02-07 NOTE — H&P (Signed)
Amanda [name] is a 38 y.o. female [id number] who presented to MAU with complaint of cramping and decreased fetal movement for past two days. Pt had a nonreactive NST in MAU; a subsequent BPP was 6/8 ( -2 for fetal breathing movement) = 6/10. Pt is dated per LMP which was confirmed by a 10 week [place]. Her pregnancy is complicated by gestational thrombocytopenia, sickle cell trait ( FOB neg), AMA and obesity. She had a cesarean section with her last pregnancy and desires a BTL. She is scheduled for repeat cesarean section on 02/13/2018  OB History    Gravida  5   Para  1   Term  1   Preterm  0   AB  3   Living  1     SAB  2   TAB  1   Ectopic  0   Multiple  0   Live Births  1          Past Medical History:  Diagnosis Date  . Anginal pain ([place])    r/t anxiety  . Anxiety   . Asthma    no inhaler - seasonal  . Constipation   . Depression   . Dysrhythmia    Palpatations - r/t anxiety  . Genital warts   . GERD (gastroesophageal reflux disease)   . H/O hiatal hernia    surgery to repair  . Headache(784.0)   . Heart murmur   . MRSA cellulitis    greater than 10 yrs ago dx at urgent care   . Sickle cell trait [facility])    Past Surgical History:  Procedure Laterality Date  . CERVICAL CONE BIOPSY    . CESAREAN SECTION  01/20/2012   Procedure: CESAREAN SECTION;  Surgeon: Melina Schools, MD;  Location: [facility];  Service: Obstetrics;  Laterality: N/A;  . COLONOSCOPY    . DILATION AND EVACUATION N/A 03/30/2017   Procedure: DILATATION AND EVACUATION WITH ULTRASOUND GUIDANCE;  Surgeon: Janyth Contes, MD;  Location: [facility];  Service: Gynecology;  Laterality: N/A;  . HERNIA REPAIR    . INSERTION OF MESH N/A 05/15/2015   Procedure: INSERTION OF MESH;  Surgeon: Ralene Ok, MD;  Location: [facility];  Service: General;  Laterality: N/A;  . MARSUPIALIZATION URETHRAL DIVERTICULUM    . UMBILICAL HERNIA REPAIR N/A 05/15/2015   Procedure: LAPAROSCOPIC UMBILICAL HERNIA;   Surgeon: Ralene Ok, MD;  Location: [facility];  Service: General;  Laterality: N/A;  . UPPER GI ENDOSCOPY    . WISDOM TOOTH EXTRACTION     Family History: family history includes Breast cancer in her maternal aunt; Cancer in her maternal aunt; Depression in her father and mother; Diabetes in her maternal grandfather; Heart disease in her maternal grandfather and maternal grandmother; Hypertension in her maternal grandfather and mother. Social History:  reports that she has never smoked. She has never used smokeless tobacco. She reports that she does not drink alcohol or use drugs.     Maternal Diabetes: No Genetic Screening: Normal Maternal Ultrasounds/Referrals: Normal Fetal Ultrasounds or other Referrals:  None Maternal Substance Abuse:  No Significant Maternal Medications:  None Significant Maternal Lab Results:  None Other Comments:  None  Review of Systems  Constitutional: Positive for malaise/fatigue. Negative for chills and fever.  Gastrointestinal: Positive for abdominal pain. Negative for vomiting.  Musculoskeletal: Positive for myalgias.   Maternal Medical History:  Reason for admission: Prolonged monitoring  Contractions: Frequency: irregular.   Perceived severity is mild.    Fetal activity: Perceived  fetal activity is decreased.   Last perceived fetal movement was within the past 12 hours.    Prenatal complications: Thrombocytopenia.     Dilation: Closed Exam by:: Dr. Terri Piedra Blood pressure 137/78, pulse 72, temperature 98.8 F (37.1 C), temperature source Oral, resp. rate 18, weight 125.2 kg, last menstrual period 11/03/2016, SpO2 100 %, unknown if currently breastfeeding. Maternal Exam:  Uterine Assessment: Contraction strength is mild.  Contraction frequency is rare.   Introitus: Normal vulva. Vulva is negative for condylomata and lesion.  Normal vagina.  Vagina is negative for condylomata.  Cervix: Cervix evaluated by digital exam.     Fetal Exam Fetal  Monitor Review: Baseline rate: 145.  Pattern: no decelerations and accelerations present.    Fetal State Assessment: Category I - tracings are normal.     Physical Exam  Constitutional: She is oriented to person, place, and time. She appears well-developed and well-nourished.  Cardiovascular: Normal rate.  GI: Soft.  Genitourinary:    Vulva, vagina and uterus normal.     No vulval condylomata or lesion noted.   Musculoskeletal: Normal range of motion.  Neurological: She is alert and oriented to person, place, and time.  Skin: Skin is warm.  Psychiatric: She has a normal mood and affect. Her behavior is normal. Judgment and thought content normal.    Prenatal labs: ABO, Rh: O/Positive/-- (07/01 0000) Antibody: Negative (07/01 0000) Rubella: Immune (07/01 0000) RPR: Nonreactive (07/01 0000)  HBsAg: Negative (07/01 0000)  HIV: Non-reactive (07/01 0000)  GBS: Positive (12/27 0000)   Assessment/Plan: 58IF [id number] female at 46 4/[redacted]wks gestation with decreased fetal movements; BPP 6/10 Offered prolonged monitoring; if concerning assessment noted, given unfavorable cervix and history of prior cesarean section, will need repeat cesarean section for delivery Pt upset about offered plan of care Used expletives and said " I know I can leave at any time" Pt requested I leave her room - I did  Pt then left shortly afterwards AMA after removing her own [place] 02/07/2018, 9:44 PM

## 2018-02-07 NOTE — MAU Provider Note (Signed)
History     CSN: [id number]  Arrival date and time: 02/07/18 1659   First Provider Initiated Contact with Patient 02/07/18 1752      Chief Complaint  Patient presents with  . Decreased Fetal Movement   HPI  Ms.  Amanda [name] is a 38 y.o. year old [id number] female at [redacted]w[redacted]d weeks gestation who presents to MAU reporting pelvic pain and DFM x 2 days. She states she just doesn't get a good feeling about the baby's movements. She states that "he usually moves a lot more than this." She is very tearful "feeling like something is wrong." She denies UC's, VB or LOF. She receives [facility] with Dr. Melba Coon. She is scheduled for a C-Section and BTL.  Past Medical History:  Diagnosis Date  . Anginal pain ([place])    r/t anxiety  . Anxiety   . Asthma    no inhaler - seasonal  . Constipation   . Depression   . Dysrhythmia    Palpatations - r/t anxiety  . Genital warts   . GERD (gastroesophageal reflux disease)   . H/O hiatal hernia    surgery to repair  . Headache(784.0)   . Heart murmur   . MRSA cellulitis    greater than 10 yrs ago dx at urgent care   . Sickle cell trait [facility])     Past Surgical History:  Procedure Laterality Date  . CERVICAL CONE BIOPSY    . CESAREAN SECTION  01/20/2012   Procedure: CESAREAN SECTION;  Surgeon: Melina Schools, MD;  Location: [facility];  Service: Obstetrics;  Laterality: N/A;  . COLONOSCOPY    . DILATION AND EVACUATION N/A 03/30/2017   Procedure: DILATATION AND EVACUATION WITH ULTRASOUND GUIDANCE;  Surgeon: Janyth Contes, MD;  Location: [facility];  Service: Gynecology;  Laterality: N/A;  . HERNIA REPAIR    . INSERTION OF MESH N/A 05/15/2015   Procedure: INSERTION OF MESH;  Surgeon: Ralene Ok, MD;  [facility];  Service: General;  Laterality: N/A;  . MARSUPIALIZATION URETHRAL DIVERTICULUM    . UMBILICAL HERNIA REPAIR N/A 05/15/2015   Procedure: LAPAROSCOPIC UMBILICAL HERNIA;  Surgeon: Ralene Ok, MD;  [facility];  Service: General;  Laterality: N/A;  . UPPER GI ENDOSCOPY    . WISDOM TOOTH EXTRACTION      Family History  Problem Relation Age of Onset  . Hypertension Mother   . Depression Mother   . Depression Father   . Heart disease Maternal Grandmother   . Breast cancer Maternal Aunt        [employer]  . Cancer Maternal Aunt   . Diabetes Maternal Grandfather   . Heart disease Maternal Grandfather   . Hypertension Maternal Grandfather   . Anesthesia problems Neg Hx     Social History   Tobacco Use  . Smoking status: Never Smoker  . Smokeless tobacco: Never Used  Substance Use Topics  . Alcohol use: No    Alcohol/week: 0.0 standard drinks  . Drug use: No    Allergies:  Allergies  Allergen Reactions  . Doxycycline Hyclate Hives, Itching and Swelling  . Ketorolac Tromethamine Other (See Comments)    Patient stated that it makes her extremely hyper  . Amoxicillin Hives, Swelling and Rash    Has patient had a PCN reaction causing immediate rash, facial/tongue/throat swelling, SOB or lightheadedness with hypotension: Unsure Has patient had a PCN reaction causing severe rash involving mucus membranes or skin necrosis: Unsure Has patient  had a PCN reaction that required hospitalization No Has patient had a PCN reaction occurring within the last 10 years: No If all of the above answers are "NO", then may proceed with Cephalosporin use.REACTION: facial swelling and rash.    Medications Prior to Admission  Medication Sig Dispense Refill Last Dose  . acetaminophen (TYLENOL) 500 MG tablet Take 1,000 mg by mouth every 6 (six) hours as needed for moderate pain.    Past Week at Unknown time  . cyclobenzaprine (FLEXERIL) 10 MG tablet Take 10 mg by mouth every 8 (eight) hours as needed for muscle spasms (Migraines).   Past Week at Unknown time  . famotidine (PEPCID) 20 MG tablet Take 20 mg by mouth daily as needed for heartburn or indigestion.    01/31/2018 at Unknown time  . Prenatal  Vit-Fe Fumarate-FA (PRENATAL MULTIVITAMIN) TABS tablet Take 1 tablet by mouth daily at 12 noon.   01/31/2018 at Unknown time    Review of Systems  Constitutional: Negative.   HENT: Negative.   Eyes: Negative.   Respiratory: Negative.   Cardiovascular: Negative.   Gastrointestinal: Negative.   Endocrine: Negative.   Genitourinary: Positive for pelvic pain.       DFM x 2 days  Musculoskeletal: Negative.   Skin: Negative.   Allergic/Immunologic: Negative.   Neurological: Negative.   Hematological: Negative.   Psychiatric/Behavioral: Negative.    Physical Exam   Blood pressure 122/80, pulse 97, temperature 98.7 F (37.1 C), resp. rate 12, weight 125.2 kg, last menstrual period 11/03/2016, SpO2 100 %  Physical Exam  Nursing note and vitals reviewed. Constitutional: She is oriented to person, place, and time. She appears well-developed and well-nourished.  HENT:  Head: Normocephalic and atraumatic.  Eyes: Pupils are equal, round, and reactive to light.  Neck: Normal range of motion.  Cardiovascular: Normal rate and regular rhythm.  Respiratory: Effort normal.  GI: Soft.  Genitourinary:    Genitourinary Comments: Pelvic deferred   Musculoskeletal: Normal range of motion.  Neurological: She is alert and oriented to person, place, and time.  Skin: Skin is warm and dry.  Psychiatric: She has a normal mood and affect. Her behavior is normal. Judgment and thought content normal.    MAU Course  Procedures  MDM CCUA OB MFM BPP  NST - FHR: 135 bpm / moderate variability / 10x10 accels present / decels absent / TOCO: none  *TC to Dr. Terri Piedra @ 1945 -- no answer, LVM *Consult with Dr. Terri Piedra @ 1952 - notified of patient's complaints, assessments, lab & U/S results, tx plan admit to L&D - agrees with plan   Results for orders placed or performed during the hospital encounter of 02/07/18 (from the past 24 hour(s))  Urinalysis, Routine w reflex microscopic     Status: Abnormal    Collection Time: 02/07/18  5:42 PM  Result Value Ref Range   Color, Urine YELLOW YELLOW   APPearance CLEAR CLEAR   Specific Gravity, Urine 1.009 1.005 - 1.030   pH 7.0 5.0 - 8.0   Glucose, UA NEGATIVE NEGATIVE mg/dL   Hgb urine dipstick NEGATIVE NEGATIVE   Bilirubin Urine NEGATIVE NEGATIVE   Ketones, ur 80 (A) NEGATIVE mg/dL   Protein, ur NEGATIVE NEGATIVE mg/dL   Nitrite NEGATIVE NEGATIVE   Leukocytes, UA NEGATIVE NEGATIVE    Assessment and Plan  Decreased fetal movement   - Admit to L&D  - Routine orders - NPO - Dr. Terri Piedra will assume care of patient upon admission  Laury Deep, MSN,  CNM 02/07/2018, 5:52 PM

## 2018-02-08 ENCOUNTER — Encounter (HOSPITAL_COMMUNITY): Payer: Self-pay | Admitting: *Deleted

## 2018-02-08 LAB — RPR: RPR Ser Ql: NONREACTIVE

## 2018-02-12 ENCOUNTER — Encounter (HOSPITAL_COMMUNITY): Payer: Self-pay | Admitting: Obstetrics and Gynecology

## 2018-02-12 ENCOUNTER — Encounter (HOSPITAL_COMMUNITY)
Admission: RE | Admit: 2018-02-12 | Discharge: 2018-02-12 | Disposition: A | Discharge status: Home / Self Care | Payer: Medicaid Other | Source: Ambulatory Visit | Attending: Obstetrics and Gynecology | Admitting: Obstetrics and Gynecology

## 2018-02-12 DIAGNOSIS — Z98891 History of uterine scar from previous surgery: Secondary | ICD-10-CM

## 2018-02-12 HISTORY — DX: History of uterine scar from previous surgery: Z98.891

## 2018-02-12 LAB — CBC
HCT: 34.7 % — ABNORMAL LOW (ref 36.0–46.0)
Hemoglobin: 11.2 g/dL — ABNORMAL LOW (ref 12.0–15.0)
MCH: 24.9 pg — ABNORMAL LOW (ref 26.0–34.0)
MCHC: 32.3 g/dL (ref 30.0–36.0)
MCV: 77.3 fL — ABNORMAL LOW (ref 80.0–100.0)
PLATELETS: 138 10*3/uL — AB (ref 150–400)
RBC: 4.49 MIL/uL (ref 3.87–5.11)
RDW: 15.1 % (ref 11.5–15.5)
WBC: 8.5 10*3/uL (ref 4.0–10.5)
nRBC: 0 % (ref 0.0–0.2)

## 2018-02-12 LAB — TYPE AND SCREEN
ABO/RH(D): O POS
Antibody Screen: NEGATIVE

## 2018-02-12 NOTE — Anesthesia Preprocedure Evaluation (Addendum)
Anesthesia Evaluation  Patient identified by MRN, date of birth, ID band Patient awake    Reviewed: Allergy & Precautions, NPO status , Patient's Chart, lab work & pertinent test results  Airway Mallampati: II  TM Distance: <3 FB Neck ROM: Full    Dental  (+) Dental Advisory Given   Pulmonary asthma ,    Pulmonary exam normal breath sounds clear to auscultation       Cardiovascular + angina Normal cardiovascular exam+ dysrhythmias + Valvular Problems/Murmurs  Rhythm:Regular Rate:Normal  '14 TTE - EF 65%, mild LVH, no RWMA or valvular pathology.   Neuro/Psych Anxiety Depression negative neurological ROS     GI/Hepatic Neg liver ROS, hiatal hernia, GERD  Medicated and Controlled,  Endo/Other  Morbid obesity  Renal/GU negative Renal ROS     Musculoskeletal negative musculoskeletal ROS (+)   Abdominal (+) + obese,   Peds  Hematology  (+) Blood dyscrasia, Sickle cell trait ,   Anesthesia Other Findings   Reproductive/Obstetrics (+) Pregnancy                            Anesthesia Physical  Anesthesia Plan  ASA: III  Anesthesia Plan: Spinal   Post-op Pain Management:    Induction: Intravenous  PONV Risk Score and Plan: Treatment may vary due to age or medical condition, Ondansetron, Dexamethasone and Scopolamine patch - Pre-op  Airway Management Planned: Simple Face Mask and Natural Airway  Additional Equipment: None  Intra-op Plan:   Post-operative Plan:   Informed Consent: I have reviewed the patients History and Physical, chart, labs and discussed the procedure including the risks, benefits and alternatives for the proposed anesthesia with the patient or authorized representative who has indicated his/her understanding and acceptance.     Dental advisory given  Plan Discussed with: CRNA  Anesthesia Plan Comments: (Allergic to Toradol)        Anesthesia Quick  Evaluation

## 2018-02-12 NOTE — Patient Instructions (Signed)
Amanda [name]  02/12/2018   Your procedure is scheduled on:  02/13/2018  Enter through the Main Entrance of [facility] at [place] up the phone at the desk and dial [phone]  Call this number if you have problems the morning of surgery:[phone]  Remember:   Do not eat food:(After Midnight) Desps de medianoche.  Do not drink clear liquids: (After Midnight) Desps de medianoche.  Take these medicines the morning of surgery with A SIP OF WATER: none   Do not wear jewelry, make-up or nail polish.  Do not wear lotions, powders, or perfumes. Do not wear deodorant.  Do not shave 48 hours prior to surgery.  Do not bring valuables to the hospital.  [facility] is not   responsible for any belongings or valuables brought to the hospital.  Contacts, dentures or bridgework may not be worn into surgery.  Leave suitcase in the car. After surgery it may be brought to your room.  For patients admitted to the hospital, checkout time is 11:00 AM the day of              discharge.    N/A   Please read over the following fact sheets that you were given:   Surgical Site Infection Prevention

## 2018-02-12 NOTE — H&P (Signed)
Amanda [name] is a 38 y.o. female [phone] at 39+ for rLTCS given term status and declines TOLAC.  +FM, no LOF, no VB, occ ctx.  Pregnancy complicated by benign thrombocytopenia, h/o LTCS, sickle cell trait, AMA, migraines and maternal obesity.  Have d/w pt r/b/a of rLTCS w BTL.    OB History    Gravida  5   Para  1   Term  1   Preterm  0   AB  3   Living  1     SAB  2   TAB  1   Ectopic  0   Multiple  0   Live Births  1         TAB x 1, SAB x 2 LTCS 42wk female, 6#11, Amanda [name] present  +abn pap, last 2017, WNL + STD - Chl  Past Medical History:  Diagnosis Date  . Anginal pain (Amanda [name])    r/t anxiety  . Anxiety   . Asthma    no inhaler - seasonal  . Constipation   . Depression   . Dysrhythmia    Palpatations - r/t anxiety  . Genital warts   . GERD (gastroesophageal reflux disease)   . H/O hiatal hernia    surgery to repair  . Headache(784.0)   . Heart murmur   . History of low transverse cesarean section 02/12/2018  . MRSA cellulitis    greater than 10 yrs ago dx at urgent care   . Sickle cell trait Amanda [name])    Past Surgical History:  Procedure Laterality Date  . CERVICAL CONE BIOPSY    . CESAREAN SECTION  01/20/2012   Procedure: CESAREAN SECTION;  Surgeon: Melina Schools, MD;  Location: [facility];  Service: Obstetrics;  Laterality: N/A;  . COLONOSCOPY    . DILATION AND EVACUATION N/A 03/30/2017   Procedure: DILATATION AND EVACUATION WITH ULTRASOUND GUIDANCE;  Surgeon: Janyth Contes, MD;  Location: [facility];  Service: Gynecology;  Laterality: N/A;  . HERNIA REPAIR    . INSERTION OF MESH N/A 05/15/2015   Procedure: INSERTION OF MESH;  Surgeon: Ralene Ok, MD;  [facility];  Service: General;  Laterality: N/A;  . MARSUPIALIZATION URETHRAL DIVERTICULUM    . UMBILICAL HERNIA REPAIR N/A 05/15/2015   Procedure: LAPAROSCOPIC UMBILICAL HERNIA;  Surgeon: Ralene Ok, MD;  [facility];  Service: General;  Laterality: N/A;  . UPPER  GI ENDOSCOPY    . WISDOM TOOTH EXTRACTION     Family History: family history includes Breast cancer in her maternal aunt; Cancer in her maternal aunt; Depression in her father and mother; Diabetes in her maternal grandfather; Heart disease in her maternal grandfather and maternal grandmother; Hypertension in her maternal grandfather and mother. Social History:  reports that she has never smoked. She has never used smokeless tobacco. She reports that she does not drink alcohol or use drugs. single, lead teacher  Meds flexeril, protonix, PNV, Albuterol All Amoxicillin, doxycycline, toradol     Maternal Diabetes: No Genetic Screening: Normal Maternal Ultrasounds/Referrals: Normal Fetal Ultrasounds or other Referrals:  None Maternal Substance Abuse:  No Significant Maternal Medications:  None Significant Maternal Lab Results:  Lab values include: Group B Strep positive Other Comments:  None  Review of Systems  Constitutional: Negative.   HENT: Negative.   Eyes: Negative.   Respiratory: Negative.   Cardiovascular: Negative.   Gastrointestinal: Negative.   Genitourinary: Negative.   Musculoskeletal: Negative.   Skin: Negative.   Neurological: Negative.   Psychiatric/Behavioral:  Negative.    Maternal Medical History:  Contractions: Frequency: irregular.   Perceived severity is moderate.    Fetal activity: Perceived fetal activity is normal.    Prenatal Complications - Diabetes: none.      Last menstrual period 11/03/2016, unknown if currently breastfeeding. Maternal Exam:  Uterine Assessment: Contraction frequency is irregular.   Abdomen: Surgical scars: low transverse.   Fundal height is limited by habitus.   Estimated fetal weight is 6.5-7.5#.   Fetal presentation: vertex  Introitus: Normal vulva. Normal vagina.    Physical Exam  Constitutional: She is oriented to person, place, and time.  OBESE  HENT:  Head: Normocephalic and atraumatic.  Cardiovascular: Normal  rate and regular rhythm.  Respiratory: Breath sounds normal. No respiratory distress.  GI: Soft. Bowel sounds are normal. She exhibits no distension. There is no abdominal tenderness.  Genitourinary:    Vulva normal.   Musculoskeletal: Normal range of motion.  Neurological: She is alert and oriented to person, place, and time.  Skin: Skin is warm and dry.  Psychiatric: She has a normal mood and affect. Her behavior is normal.    Prenatal labs: ABO, Rh: --/--/O POS (01/20 3875) Antibody: NEG (01/20 6433) Rubella: Immune (07/01 0000) RPR: Non Reactive (01/15 2034)  HBsAg: Negative (07/01 0000)  HIV: Non-reactive (07/01 0000)  GBS: Positive (12/27 0000)   Essential panel neg (CF, SMA neg; Fragile X neg); Ur Cx neg/ GC neg/Chl neg/Varicella immune/ glucola 122/  Nl NT Limited anat, ant plac, female F/u completes anatomy  Assessment/Plan: 37yo [id number] at 39+ for rLTCS and BTL D/w pt r/b/a of rLTCS and BTL [name] for prophylaxis [name]    [name] 02/12/2018, 2:27 PM

## 2018-02-13 ENCOUNTER — Encounter (HOSPITAL_COMMUNITY)
Admission: AD | Disposition: A | Discharge status: Home / Self Care | Payer: Self-pay | Source: Home / Self Care | Attending: Obstetrics and Gynecology

## 2018-02-13 ENCOUNTER — Encounter (HOSPITAL_COMMUNITY): Payer: Self-pay | Admitting: *Deleted

## 2018-02-13 ENCOUNTER — Inpatient Hospital Stay (HOSPITAL_COMMUNITY): Payer: Medicaid Other | Admitting: Anesthesiology

## 2018-02-13 ENCOUNTER — Inpatient Hospital Stay (HOSPITAL_COMMUNITY)
Admission: AD | Admit: 2018-02-13 | Discharge: 2018-02-15 | DRG: 784 | Disposition: A | Discharge status: Home / Self Care | Payer: Medicaid Other | Attending: Obstetrics and Gynecology | Admitting: Obstetrics and Gynecology

## 2018-02-13 DIAGNOSIS — Z3A39 39 weeks gestation of pregnancy: Secondary | ICD-10-CM | POA: Diagnosis not present

## 2018-02-13 DIAGNOSIS — O9912 Other diseases of the blood and blood-forming organs and certain disorders involving the immune mechanism complicating childbirth: Secondary | ICD-10-CM | POA: Diagnosis present

## 2018-02-13 DIAGNOSIS — Z880 Allergy status to penicillin: Secondary | ICD-10-CM | POA: Diagnosis not present

## 2018-02-13 DIAGNOSIS — D573 Sickle-cell trait: Secondary | ICD-10-CM | POA: Diagnosis present

## 2018-02-13 DIAGNOSIS — O34211 Maternal care for low transverse scar from previous cesarean delivery: Secondary | ICD-10-CM | POA: Diagnosis present

## 2018-02-13 DIAGNOSIS — Z302 Encounter for sterilization: Secondary | ICD-10-CM | POA: Diagnosis not present

## 2018-02-13 DIAGNOSIS — O99214 Obesity complicating childbirth: Secondary | ICD-10-CM | POA: Diagnosis present

## 2018-02-13 DIAGNOSIS — O99824 Streptococcus B carrier state complicating childbirth: Secondary | ICD-10-CM | POA: Diagnosis present

## 2018-02-13 DIAGNOSIS — O9902 Anemia complicating childbirth: Secondary | ICD-10-CM | POA: Diagnosis present

## 2018-02-13 DIAGNOSIS — D696 Thrombocytopenia, unspecified: Secondary | ICD-10-CM | POA: Diagnosis present

## 2018-02-13 DIAGNOSIS — Z98891 History of uterine scar from previous surgery: Secondary | ICD-10-CM

## 2018-02-13 HISTORY — DX: History of uterine scar from previous surgery: Z98.891

## 2018-02-13 LAB — RPR: RPR: NONREACTIVE

## 2018-02-13 SURGERY — Surgical Case
Anesthesia: Spinal

## 2018-02-13 MED ORDER — MIDAZOLAM HCL 2 MG/2ML IJ SOLN
INTRAMUSCULAR | Status: AC
  Filled 2018-02-13: qty 2

## 2018-02-13 MED ORDER — ONDANSETRON HCL 4 MG/2ML IJ SOLN
INTRAMUSCULAR | Status: DC | PRN
  Administered 2018-02-13: 4 mg via INTRAVENOUS

## 2018-02-13 MED ORDER — WITCH HAZEL-GLYCERIN EX PADS: 1.0000 "application " | MEDICATED_PAD | CUTANEOUS | Status: DC | PRN

## 2018-02-13 MED ORDER — MORPHINE SULFATE (PF) 0.5 MG/ML IJ SOLN
INTRAMUSCULAR | Status: AC
  Filled 2018-02-13: qty 10

## 2018-02-13 MED ORDER — DIBUCAINE 1 % RE OINT: 1.0000 "application " | TOPICAL_OINTMENT | RECTAL | Status: DC | PRN

## 2018-02-13 MED ORDER — SODIUM CHLORIDE 0.9 % IR SOLN
Status: DC | PRN
  Administered 2018-02-13: 1

## 2018-02-13 MED ORDER — ONDANSETRON HCL 4 MG/2ML IJ SOLN
INTRAMUSCULAR | Status: AC
  Filled 2018-02-13: qty 2

## 2018-02-13 MED ORDER — SIMETHICONE 80 MG PO CHEW
80.0000 mg | CHEWABLE_TABLET | ORAL | Status: DC
  Administered 2018-02-13 – 2018-02-14 (×2): 80 mg via ORAL
  Filled 2018-02-13 (×2): qty 1

## 2018-02-13 MED ORDER — SODIUM CHLORIDE 0.9% FLUSH: 3.0000 mL | INTRAVENOUS | Status: DC | PRN

## 2018-02-13 MED ORDER — IBUPROFEN 800 MG PO TABS
800.0000 mg | ORAL_TABLET | Freq: Three times a day (TID) | ORAL | Status: DC
  Administered 2018-02-13 – 2018-02-15 (×6): 800 mg via ORAL
  Filled 2018-02-13 (×6): qty 1

## 2018-02-13 MED ORDER — DEXAMETHASONE SODIUM PHOSPHATE 10 MG/ML IJ SOLN
INTRAMUSCULAR | Status: AC
  Filled 2018-02-13: qty 1

## 2018-02-13 MED ORDER — SIMETHICONE 80 MG PO CHEW
80.0000 mg | CHEWABLE_TABLET | Freq: Three times a day (TID) | ORAL | Status: DC
  Administered 2018-02-13 – 2018-02-15 (×5): 80 mg via ORAL
  Filled 2018-02-13 (×5): qty 1

## 2018-02-13 MED ORDER — SCOPOLAMINE 1 MG/3DAYS TD PT72
MEDICATED_PATCH | TRANSDERMAL | Status: AC
  Filled 2018-02-13: qty 1

## 2018-02-13 MED ORDER — NALOXONE HCL 0.4 MG/ML IJ SOLN: 0.4000 mg | INTRAMUSCULAR | Status: DC | PRN

## 2018-02-13 MED ORDER — DIPHENHYDRAMINE HCL 50 MG/ML IJ SOLN: 12.5000 mg | INTRAMUSCULAR | Status: DC | PRN

## 2018-02-13 MED ORDER — PROPOFOL 10 MG/ML IV BOLUS
INTRAVENOUS | Status: AC
  Filled 2018-02-13: qty 20

## 2018-02-13 MED ORDER — CYCLOBENZAPRINE HCL 10 MG PO TABS
10.0000 mg | ORAL_TABLET | Freq: Three times a day (TID) | ORAL | Status: DC | PRN
  Filled 2018-02-13: qty 1

## 2018-02-13 MED ORDER — LACTATED RINGERS IV SOLN
INTRAVENOUS | Status: DC
  Administered 2018-02-13 (×2): via INTRAVENOUS

## 2018-02-13 MED ORDER — NALOXONE HCL 4 MG/10ML IJ SOLN
1.0000 ug/kg/h | INTRAVENOUS | Status: DC | PRN
  Filled 2018-02-13: qty 5

## 2018-02-13 MED ORDER — LACTATED RINGERS IV SOLN
INTRAVENOUS | Status: DC
  Administered 2018-02-14: via INTRAVENOUS

## 2018-02-13 MED ORDER — PHENYLEPHRINE HCL 10 MG/ML IJ SOLN
INTRAMUSCULAR | Status: DC | PRN
  Administered 2018-02-13 (×3): 80 ug via INTRAVENOUS

## 2018-02-13 MED ORDER — SENNOSIDES-DOCUSATE SODIUM 8.6-50 MG PO TABS
2.0000 | ORAL_TABLET | ORAL | Status: DC
  Administered 2018-02-13 – 2018-02-14 (×2): 2 via ORAL
  Filled 2018-02-13 (×3): qty 2

## 2018-02-13 MED ORDER — ACETAMINOPHEN 10 MG/ML IV SOLN: 1000.0000 mg | Freq: Once | INTRAVENOUS | Status: DC | PRN

## 2018-02-13 MED ORDER — MEPERIDINE HCL 25 MG/ML IJ SOLN: 6.2500 mg | INTRAMUSCULAR | Status: DC | PRN

## 2018-02-13 MED ORDER — NALBUPHINE HCL 10 MG/ML IJ SOLN
INTRAMUSCULAR | Status: AC
  Filled 2018-02-13: qty 1

## 2018-02-13 MED ORDER — NALBUPHINE HCL 10 MG/ML IJ SOLN: 5.0000 mg | Freq: Once | INTRAMUSCULAR | Status: AC | PRN

## 2018-02-13 MED ORDER — NALBUPHINE HCL 10 MG/ML IJ SOLN: 5.0000 mg | INTRAMUSCULAR | Status: DC | PRN

## 2018-02-13 MED ORDER — LIDOCAINE HCL (CARDIAC) PF 100 MG/5ML IV SOSY
PREFILLED_SYRINGE | INTRAVENOUS | Status: AC
  Filled 2018-02-13: qty 5

## 2018-02-13 MED ORDER — STERILE WATER FOR IRRIGATION IR SOLN
Status: DC | PRN
  Administered 2018-02-13: 1000 mL

## 2018-02-13 MED ORDER — HYDROMORPHONE HCL 1 MG/ML IJ SOLN
0.2500 mg | INTRAMUSCULAR | Status: DC | PRN
  Administered 2018-02-13: 0.5 mg via INTRAVENOUS

## 2018-02-13 MED ORDER — DIPHENHYDRAMINE HCL 25 MG PO CAPS
25.0000 mg | ORAL_CAPSULE | ORAL | Status: DC | PRN
  Filled 2018-02-13: qty 1

## 2018-02-13 MED ORDER — SIMETHICONE 80 MG PO CHEW: 80.0000 mg | CHEWABLE_TABLET | ORAL | Status: DC | PRN

## 2018-02-13 MED ORDER — PRENATAL MULTIVITAMIN CH
1.0000 | ORAL_TABLET | Freq: Every day | ORAL | Status: DC
  Administered 2018-02-14: 1 via ORAL
  Filled 2018-02-13: qty 1

## 2018-02-13 MED ORDER — OXYTOCIN 40 UNITS IN NORMAL SALINE INFUSION - SIMPLE MED
INTRAVENOUS | Status: DC | PRN
  Administered 2018-02-13: 40 mL via INTRAVENOUS

## 2018-02-13 MED ORDER — ROCURONIUM BROMIDE 100 MG/10ML IV SOLN
INTRAVENOUS | Status: AC
  Filled 2018-02-13: qty 1

## 2018-02-13 MED ORDER — ACETAMINOPHEN 10 MG/ML IV SOLN
INTRAVENOUS | Status: AC
  Filled 2018-02-13: qty 100

## 2018-02-13 MED ORDER — OXYTOCIN 40 UNITS IN NORMAL SALINE INFUSION - SIMPLE MED: 2.5000 [IU]/h | INTRAVENOUS | Status: AC

## 2018-02-13 MED ORDER — SODIUM BICARBONATE 8.4 % IV SOLN
INTRAVENOUS | Status: AC
  Filled 2018-02-13: qty 50

## 2018-02-13 MED ORDER — OXYCODONE HCL 5 MG PO TABS
5.0000 mg | ORAL_TABLET | ORAL | Status: DC | PRN
  Administered 2018-02-14: 5 mg via ORAL
  Administered 2018-02-14 – 2018-02-15 (×3): 10 mg via ORAL
  Filled 2018-02-13: qty 2
  Filled 2018-02-13: qty 1
  Filled 2018-02-13 (×2): qty 2

## 2018-02-13 MED ORDER — SCOPOLAMINE 1 MG/3DAYS TD PT72
1.0000 | MEDICATED_PATCH | Freq: Once | TRANSDERMAL | Status: AC
  Administered 2018-02-13: 1.5 mg via TRANSDERMAL
  Administered 2018-02-13: 1 via TRANSDERMAL

## 2018-02-13 MED ORDER — FENTANYL CITRATE (PF) 250 MCG/5ML IJ SOLN
INTRAMUSCULAR | Status: AC
  Filled 2018-02-13: qty 5

## 2018-02-13 MED ORDER — FENTANYL CITRATE (PF) 100 MCG/2ML IJ SOLN
INTRAMUSCULAR | Status: DC | PRN
  Administered 2018-02-13: 15 ug via INTRATHECAL

## 2018-02-13 MED ORDER — ACETAMINOPHEN 10 MG/ML IV SOLN
INTRAVENOUS | Status: DC | PRN
  Administered 2018-02-13: 1000 mg via INTRAVENOUS

## 2018-02-13 MED ORDER — MORPHINE SULFATE (PF) 0.5 MG/ML IJ SOLN
INTRAMUSCULAR | Status: DC | PRN
  Administered 2018-02-13: .15 mg via INTRATHECAL

## 2018-02-13 MED ORDER — PHENYLEPHRINE 8 MG IN D5W 100 ML (0.08MG/ML) PREMIX OPTIME
INJECTION | INTRAVENOUS | Status: DC | PRN
  Administered 2018-02-13: 60 ug/min via INTRAVENOUS

## 2018-02-13 MED ORDER — TETANUS-DIPHTH-ACELL PERTUSSIS 5-2.5-18.5 LF-MCG/0.5 IM SUSP: 0.5000 mL | Freq: Once | INTRAMUSCULAR | Status: DC

## 2018-02-13 MED ORDER — ONDANSETRON HCL 4 MG/2ML IJ SOLN: 4.0000 mg | Freq: Three times a day (TID) | INTRAMUSCULAR | Status: DC | PRN

## 2018-02-13 MED ORDER — FAMOTIDINE 20 MG PO TABS: 20.0000 mg | ORAL_TABLET | Freq: Every day | ORAL | Status: DC | PRN

## 2018-02-13 MED ORDER — DIPHENHYDRAMINE HCL 25 MG PO CAPS
25.0000 mg | ORAL_CAPSULE | Freq: Four times a day (QID) | ORAL | Status: DC | PRN
  Administered 2018-02-14: 25 mg via ORAL
  Filled 2018-02-13: qty 1

## 2018-02-13 MED ORDER — NALBUPHINE HCL 10 MG/ML IJ SOLN
5.0000 mg | Freq: Once | INTRAMUSCULAR | Status: AC | PRN
  Administered 2018-02-13: 5 mg via INTRAVENOUS

## 2018-02-13 MED ORDER — CEFAZOLIN SODIUM-DEXTROSE 2-4 GM/100ML-% IV SOLN
INTRAVENOUS | Status: AC
  Filled 2018-02-13: qty 100

## 2018-02-13 MED ORDER — SUCCINYLCHOLINE CHLORIDE 200 MG/10ML IV SOSY
PREFILLED_SYRINGE | INTRAVENOUS | Status: AC
  Filled 2018-02-13: qty 10

## 2018-02-13 MED ORDER — PROMETHAZINE HCL 25 MG/ML IJ SOLN: 6.2500 mg | INTRAMUSCULAR | Status: DC | PRN

## 2018-02-13 MED ORDER — GENTAMICIN SULFATE 40 MG/ML IJ SOLN
INTRAVENOUS | Status: AC
  Administered 2018-02-13: 630 mg via INTRAVENOUS
  Filled 2018-02-13: qty 15.75

## 2018-02-13 MED ORDER — FENTANYL CITRATE (PF) 100 MCG/2ML IJ SOLN
INTRAMUSCULAR | Status: AC
  Filled 2018-02-13: qty 2

## 2018-02-13 MED ORDER — MENTHOL 3 MG MT LOZG: 1.0000 | LOZENGE | OROMUCOSAL | Status: DC | PRN

## 2018-02-13 MED ORDER — ZOLPIDEM TARTRATE 5 MG PO TABS: 5.0000 mg | ORAL_TABLET | Freq: Every evening | ORAL | Status: DC | PRN

## 2018-02-13 MED ORDER — HYDROMORPHONE HCL 1 MG/ML IJ SOLN
INTRAMUSCULAR | Status: AC
  Filled 2018-02-13: qty 1

## 2018-02-13 MED ORDER — COCONUT OIL OIL: 1.0000 "application " | TOPICAL_OIL | Status: DC | PRN

## 2018-02-13 SURGICAL SUPPLY — 40 items
BENZOIN TINCTURE PRP APPL 2/3 (GAUZE/BANDAGES/DRESSINGS) ×3 IMPLANT
CHLORAPREP W/TINT 26ML (MISCELLANEOUS) ×3 IMPLANT
CLAMP CORD UMBIL (MISCELLANEOUS) IMPLANT
CLOSURE WOUND 1/2 X4 (GAUZE/BANDAGES/DRESSINGS) ×1
CLOTH BEACON ORANGE TIMEOUT ST (SAFETY) ×3 IMPLANT
DRESSING PREVENA PLUS CUSTOM (GAUZE/BANDAGES/DRESSINGS) ×1 IMPLANT
DRSG OPSITE POSTOP 4X10 (GAUZE/BANDAGES/DRESSINGS) ×3 IMPLANT
DRSG PREVENA PLUS CUSTOM (GAUZE/BANDAGES/DRESSINGS) ×3
ELECT REM PT RETURN 9FT ADLT (ELECTROSURGICAL) ×3
ELECTRODE REM PT RTRN 9FT ADLT (ELECTROSURGICAL) ×1 IMPLANT
EXTRACTOR VACUUM M CUP 4 TUBE (SUCTIONS) ×2 IMPLANT
EXTRACTOR VACUUM M CUP 4' TUBE (SUCTIONS) ×1
GLOVE BIO SURGEON STRL SZ 6.5 (GLOVE) ×2 IMPLANT
GLOVE BIO SURGEONS STRL SZ 6.5 (GLOVE) ×1
GLOVE BIOGEL PI IND STRL 7.0 (GLOVE) ×1 IMPLANT
GLOVE BIOGEL PI INDICATOR 7.0 (GLOVE) ×2
GOWN STRL REUS W/TWL LRG LVL3 (GOWN DISPOSABLE) ×6 IMPLANT
KIT ABG SYR 3ML LUER SLIP (SYRINGE) IMPLANT
KIT PREVENA INCISION MGT20CM45 (CANNISTER) ×3 IMPLANT
NEEDLE HYPO 25X5/8 SAFETYGLIDE (NEEDLE) IMPLANT
NS IRRIG 1000ML POUR BTL (IV SOLUTION) ×3 IMPLANT
PACK C SECTION WH (CUSTOM PROCEDURE TRAY) ×3 IMPLANT
PAD OB MATERNITY 4.3X12.25 (PERSONAL CARE ITEMS) ×3 IMPLANT
PENCIL SMOKE EVAC W/HOLSTER (ELECTROSURGICAL) ×6 IMPLANT
RTRCTR C-SECT PINK 25CM LRG (MISCELLANEOUS) ×3 IMPLANT
SPONGE LAP 18X18 RF (DISPOSABLE) ×9 IMPLANT
STRIP CLOSURE SKIN 1/2X4 (GAUZE/BANDAGES/DRESSINGS) ×2 IMPLANT
SUT MNCRL 0 VIOLET CTX 36 (SUTURE) ×2 IMPLANT
SUT MONOCRYL 0 CTX 36 (SUTURE) ×4
SUT PLAIN 0 NONE (SUTURE) ×3 IMPLANT
SUT PLAIN 1 NONE 54 (SUTURE) IMPLANT
SUT PLAIN 2 0 XLH (SUTURE) ×3 IMPLANT
SUT VIC AB 0 CT1 27 (SUTURE) ×4
SUT VIC AB 0 CT1 27XBRD ANBCTR (SUTURE) ×2 IMPLANT
SUT VIC AB 2-0 CT1 27 (SUTURE) ×2
SUT VIC AB 2-0 CT1 TAPERPNT 27 (SUTURE) ×1 IMPLANT
SUT VIC AB 4-0 KS 27 (SUTURE) ×6 IMPLANT
SYR BULB IRRIGATION 50ML (SYRINGE) ×3 IMPLANT
TOWEL OR 17X24 6PK STRL BLUE (TOWEL DISPOSABLE) ×3 IMPLANT
TRAY FOLEY W/BAG SLVR 14FR LF (SET/KITS/TRAYS/PACK) IMPLANT

## 2018-02-13 NOTE — Lactation Note (Signed)
This note was copied from a baby's chart. Lactation Consultation Note  Patient Name: Amanda [name] [id number] Date: 02/13/2018 Reason for consult: Initial assessment  P2 mom breastfed her first child for 1 month.  Stopped due to lack of support.   Mom is able to demonstrate hand exp. And drops of colostrum seen on nipple.  Mom has very large breast and rolled blankets and 1 pillow were used under breast for breast support. LC assisted with latch in football hold on right side.  Mom has compressible breast tissue with shorter shafted nipples.  Infant does achieve latch.  Rhythmic jaw movement noted and a couple of swallows heard.    BF basics reviewed with mom;  At least 8-12 bf in a 24 hour period, feeding cues, waking techniques.  Lactation brochure given and BFSG info and OP services shared with mom.  Encouraged her to call out for further questions or assistance with feeding.  Maternal Data Has patient been taught Hand Expression?: Yes Does the patient have breastfeeding experience prior to this delivery?: Yes  Feeding Feeding Type: Breast Fed  LATCH Score Latch: Repeated attempts needed to sustain latch, nipple held in mouth throughout feeding, stimulation needed to elicit sucking reflex.  Audible Swallowing: A few with stimulation  Type of Nipple: Everted at rest and after stimulation(shorter shaft, easily compressible)  Comfort (Breast/Nipple): Soft / non-tender  Hold (Positioning): Assistance needed to correctly position infant at breast and maintain latch.(large breasts; blankets/pillow used for breast support during bf)  LATCH Score: 7  Interventions Interventions: Breast feeding basics reviewed;Assisted with latch;Skin to skin;Breast massage;Hand express;Adjust position;Breast compression;Support pillows  Lactation Tools Discussed/Used WIC Program: Yes   Consult Status Consult Status: Follow-up Date: 02/14/18 Follow-up type: In-patient    [facility] 02/13/2018, 9:35 PM

## 2018-02-13 NOTE — Anesthesia Procedure Notes (Signed)
Spinal  Patient location during procedure: OR Staffing Anesthesiologist: Nolon Nations, MD Performed: anesthesiologist  Preanesthetic Checklist Completed: patient identified, site marked, surgical consent, pre-op evaluation, timeout performed, IV checked, risks and benefits discussed and monitors and equipment checked Spinal Block Patient position: sitting Prep: site prepped and draped and DuraPrep Patient monitoring: heart rate, continuous pulse ox and blood pressure Approach: midline Location: L3-4 Injection technique: single-shot Needle Needle type: Sprotte  Needle gauge: 24 G Needle length: 9 cm Assessment Sensory level: T6 Additional Notes Expiration date of kit checked and confirmed. Patient tolerated procedure well, without complications.

## 2018-02-13 NOTE — Interval H&P Note (Signed)
History and Physical Interval Note:  02/13/2018 8:52 AM  [name]  has presented today for surgery, with the diagnosis of repeat C-Section  The various methods of treatment have been discussed with the patient and family. After consideration of risks, benefits and other options for treatment, the patient has consented to  Procedure(s) with comments: [name] (N/A) - [name], [name] as a surgical intervention .  The patient's history has been reviewed, patient examined, no change in status, stable for surgery.  I have reviewed the patient's chart and labs.  Questions were answered to the patient's satisfaction.     Amanda [name]

## 2018-02-13 NOTE — Transfer of Care (Signed)
Immediate Anesthesia Transfer of Care Note  Patient: Amanda [name]  Procedure(s) Performed: CESAREAN SECTION WITH BILATERAL TUBAL LIGATION (N/A )  Patient Location: PACU  Anesthesia Type:Spinal  Level of Consciousness: awake, alert  and oriented  Airway & Oxygen Therapy: Patient Spontanous Breathing  Post-op Assessment: Report given to RN and Post -op Vital signs reviewed and stable  Post vital signs: Reviewed, stable HR 80, RR 15, SaO2 100%, 115/58  Last Vitals:  Vitals Value Taken Time  BP 115/58 02/13/2018 11:30 AM  Temp    Pulse 86 02/13/2018 11:33 AM  Resp 11 02/13/2018 11:33 AM  SpO2 100 % 02/13/2018 11:33 AM  Vitals shown include unvalidated device data.  Last Pain:  Vitals:   02/13/18 0752  PainSc: 0-No pain         Complications: No apparent anesthesia complications

## 2018-02-13 NOTE — Anesthesia Postprocedure Evaluation (Signed)
Anesthesia Post Note  Patient: Amanda [name]  Procedure(s) Performed: CESAREAN SECTION WITH BILATERAL TUBAL LIGATION (N/A )     Patient location during evaluation: Mother Baby Anesthesia Type: Spinal Level of consciousness: awake and alert and oriented Pain management: satisfactory to patient Vital Signs Assessment: post-procedure vital signs reviewed and stable Respiratory status: respiratory function stable and spontaneous breathing Cardiovascular status: blood pressure returned to baseline Postop Assessment: no headache, no backache, spinal receding, patient able to bend at knees and adequate PO intake Anesthetic complications: no    Last Vitals:  Vitals:   02/13/18 1344 02/13/18 1445  BP: 112/73 127/70  Pulse: (!) 57 67  Resp: 18 16  Temp: 36.6 C 36.7 C  SpO2:  100%    Last Pain:  Vitals:   02/13/18 1445  TempSrc: Oral  PainSc: 3    Pain Goal:                   Denelda Akerley

## 2018-02-13 NOTE — Brief Op Note (Signed)
02/13/2018  11:22 AM  PATIENT:  [name]  38 y.o. female  PRE-OPERATIVE DIAGNOSIS:  repeat C-Section, undesired fertility  POST-OPERATIVE DIAGNOSIS:  repeat c-section; undesired fertility; tubal ligation   PROCEDURE:  Procedure(s) with comments: [place] (N/A) - [name], RNFA  Needs [name]  SURGEON:  Surgeon(s) and Role:    * Bovard-Stuckert, Quency Tober, MD - Primary  FINDINGS: viable female infant at 10:21 apgars 8/9, wt P; nl PP uterus and B fallopian tubes  ANESTHESIA:   spinal  EBL:  148 mL IVF and uop per anesthesia  BLOOD ADMINISTERED:none  DRAINS: Urinary Catheter (Foley)   LOCAL MEDICATIONS USED:  NONE  SPECIMEN:  Source of Specimen:  Placenta and B tubal segments  DISPOSITION OF SPECIMEN:  L&D and pathology  COUNTS:  YES  TOURNIQUET:  * No tourniquets in log *  DICTATION: .Other Dictation: Dictation Number [id number]  PLAN OF CARE: Admit to inpatient   PATIENT DISPOSITION:  PACU - hemodynamically stable.   Delay start of Pharmacological VTE agent (>24hrs) due to surgical blood loss or risk of bleeding: not applicable

## 2018-02-14 LAB — CBC
HCT: 32.7 % — ABNORMAL LOW (ref 36.0–46.0)
Hemoglobin: 10.7 g/dL — ABNORMAL LOW (ref 12.0–15.0)
MCH: 25.1 pg — AB (ref 26.0–34.0)
MCHC: 32.7 g/dL (ref 30.0–36.0)
MCV: 76.8 fL — ABNORMAL LOW (ref 80.0–100.0)
Platelets: 136 10*3/uL — ABNORMAL LOW (ref 150–400)
RBC: 4.26 MIL/uL (ref 3.87–5.11)
RDW: 15.3 % (ref 11.5–15.5)
WBC: 8.6 10*3/uL (ref 4.0–10.5)
nRBC: 0 % (ref 0.0–0.2)

## 2018-02-14 LAB — BIRTH TISSUE RECOVERY COLLECTION (PLACENTA DONATION)

## 2018-02-14 NOTE — Progress Notes (Addendum)
Patient ID: Amanda [name], female   DOB: 08-May-1980, 38 y.o.   MRN: [id number] Late entry  Chart check: POD#0  BPP 104-129/70-82, HR 57-67  No reported complaints

## 2018-02-14 NOTE — Progress Notes (Addendum)
Subjective: Postpartum Day 1: Cesarean Delivery Patient reports tolerating PO and no problems voiding.  Cramping with breastfeeding not totally relieved with Ibuprofen  Objective: Vital signs in last 24 hours: Temp:  [97.4 F (36.3 C)-98.6 F (37 C)] 97.8 F (36.6 C) (01/21 2345) Pulse Rate:  [57-90] 60 (01/22 0535) Resp:  [14-20] 20 (01/22 0535) BP: (104-129)/(58-96) 110/67 (01/22 0535) SpO2:  [98 %-100 %] 100 % (01/21 2345)  Physical Exam:  General: alert and cooperative Lochia: appropriate Uterine Fundus: firm Incision: C/D/I   Recent Labs    02/12/18 0943 02/14/18 0535  HGB 11.2* 10.7*  HCT 34.7* 32.7*    Assessment/Plan: Status post Cesarean section. Doing well postoperatively.  Continue current care. Oxycodone prn  Platelets stable, 136K  Logan Bores 02/14/2018, 8:35 AM

## 2018-02-14 NOTE — Lactation Note (Signed)
This note was copied from a baby's chart. Lactation Consultation Note  Patient Name: Amanda [name] QBHAL'P Date: 02/14/2018 Reason for consult: Follow-up assessment  Mom states infant fed for 30min recently, but infant showing some feeding cues.  Attempted to assist mom to latch infant to right breast in both cradle and football hold positions.  Infant latched but little to no suckles noted. Reinforced to mom how to support her large breasts and her baby during feedings. Discussed wide latch and flanged lips.  Mom reports hearing swallows during feedings. Mom requests DEBP in hospital, but reports her "[employer]" worker brought her a pump today. Pump provided at bedside and mom states she will pump soon.  Maternal Data Formula Feeding for Exclusion: No Has patient been taught Hand Expression?: Yes Does the patient have breastfeeding experience prior to this delivery?: Yes  Feeding Feeding Type: Breast Fed  LATCH Score Latch: Repeated attempts needed to sustain latch, nipple held in mouth throughout feeding, stimulation needed to elicit sucking reflex.  Audible Swallowing: A few with stimulation  Type of Nipple: Everted at rest and after stimulation  Comfort (Breast/Nipple): Soft / non-tender  Hold (Positioning): Assistance needed to correctly position infant at breast and maintain latch.  LATCH Score: 7  Interventions Interventions: Breast feeding basics reviewed;Assisted with latch;Skin to skin;Breast massage;Hand express;Pre-pump if needed;Breast compression;Adjust position;Support pillows;Position options;DEBP  Lactation Tools Discussed/Used Tools: Pump Breast pump type: Double-Electric Breast Pump WIC Program: Yes Pump Review: Setup, frequency, and cleaning;Milk Storage;Other (comment) Initiated by:: CEanes Date initiated:: 02/14/18   Consult Status Consult Status: Follow-up Date: 02/15/18 Follow-up type: In-patient    Cranston Neighbor 02/14/2018, 3:07  PM

## 2018-02-15 ENCOUNTER — Encounter (HOSPITAL_COMMUNITY): Payer: Self-pay | Admitting: Obstetrics and Gynecology

## 2018-02-15 MED ORDER — IBUPROFEN 800 MG PO TABS: 800.0000 mg | ORAL_TABLET | Freq: Three times a day (TID) | ORAL | 0 refills | Status: DC

## 2018-02-15 MED ORDER — OXYCODONE HCL 5 MG PO TABS: 5.0000 mg | ORAL_TABLET | ORAL | 0 refills | Status: DC | PRN

## 2018-02-15 NOTE — Progress Notes (Signed)
POD #2 Doing well, some discomfort from dressing, wants to go home Afeb, VSS Abd- soft, fundus firm, dressing intact D/c home

## 2018-02-15 NOTE — Discharge Instructions (Signed)
As per discharge pamphlet °

## 2018-02-15 NOTE — Lactation Note (Signed)
This note was copied from a baby's chart. Lactation Consultation Note  Patient Name: Amanda [name] Date: 02/15/2018 Reason for consult: Follow-up assessment;Term  P2 mom ready for d/c.  Mom reports breastfeeding is going well, however she decided to supplement with bottle during the night as baby was cluster feeding and she wanted a break.  Encouraged mom to continue to stimulate her breasts regularly by baby or pump. Reminded mom that milk may not come in until around 5 days after delivery with a c/s. Also reminded mom about cleaning/sanitizing pump parts.  Mom with pump at home, but can't recall brand. Mom using manual pump to draw out nipple prior to feeds.  Reviewed treatment of engorgement, expected output from baby, [facility] services, and phone number to call with any questions.  Lactation Tools Discussed/Used Pump Review: Setup, frequency, and cleaning   Consult Status Consult Status: Complete Date: 02/15/18    Cranston Neighbor 02/15/2018, 11:21 AM

## 2018-02-15 NOTE — Discharge Summary (Signed)
OB Discharge Summary     Patient Name: Amanda [name] DOB: 1980-06-21 MRN: [id number]  Date of admission: 02/13/2018 Delivering MD: Janyth Contes   Date of discharge: 02/15/2018  Admitting diagnosis: repeat C-Section Intrauterine pregnancy: [redacted]w[redacted]d     Secondary diagnosis:  Principal Problem:   History of low transverse cesarean section Active Problems:   Status post repeat low transverse cesarean section      Discharge diagnosis: Term Pregnancy [place] Hospital course:  Sceduled C/S   38 y.o. yo [id number] at [redacted]w[redacted]d was admitted to the hospital 02/13/2018 for scheduled cesarean section with the following indication:Elective Repeat.  Membrane Rupture Time/Date: 10:18 AM ,02/13/2018   Patient delivered a Viable infant.02/13/2018  Details of operation can be found in separate operative note, BTL also.  Pateint had an uncomplicated postpartum course.  She is ambulating, tolerating a regular diet, passing flatus, and urinating well. Patient is discharged home in stable condition on  02/15/18         Physical exam  Vitals:   02/14/18 0930 02/14/18 1458 02/14/18 2224 02/15/18 0521  BP: 119/73 107/66 123/74 118/64  Pulse: 77 61 62 62  Resp: 16 18 18 18   Temp: (!) 97.5 F (36.4 C) 98.8 F (37.1 C) 98.1 F (36.7 C) 98.1 F (36.7 C)  TempSrc: Oral Oral Oral Oral  SpO2:  100% 100%   Weight:      Height:       General: alert Lochia: appropriate Uterine Fundus: firm Incision: Dressing is clean, dry, and intact  Labs: Lab Results  Component Value Date   WBC 8.6 02/14/2018   HGB 10.7 (L) 02/14/2018   HCT 32.7 (L) 02/14/2018   MCV 76.8 (L) 02/14/2018   PLT 136 (L) 02/14/2018   CMP Latest Ref Rng & Units 01/05/2017  Glucose 65 - 99 mg/dL 85  BUN 6 - 20 mg/dL 9  Creatinine 0.44 - 1.00 mg/dL 0.57  Sodium 135 - 145 mmol/L 135  Potassium 3.5 - 5.1 mmol/L 3.9  Chloride  101 - 111 mmol/L 102  CO2 22 - 32 mmol/L 24  Calcium 8.9 - 10.3 mg/dL 9.2  Total Protein 6.5 - 8.1 g/dL 7.3  Total Bilirubin 0.3 - 1.2 mg/dL 0.5  Alkaline Phos 38 - 126 U/L 53  AST 15 - 41 U/L 17  ALT 14 - 54 U/L 13(L)    Discharge instruction: per After Visit Summary and "Baby and Me Booklet".  After visit meds:  Allergies as of 02/15/2018      Reactions   Doxycycline Hyclate Hives, Itching, Swelling   Ketorolac Tromethamine Other (See Comments)   Patient stated that it makes her extremely hyper   Amoxicillin Hives, Swelling, Rash   Has patient had a PCN reaction causing immediate rash, facial/tongue/throat swelling, SOB or lightheadedness with hypotension: Unsure Has patient had a PCN reaction causing severe rash involving mucus membranes or skin necrosis: Unsure Has patient had a PCN reaction that  required hospitalization No Has patient had a PCN reaction occurring within the last 10 years: No If all of the above answers are "NO", then may proceed with Cephalosporin use.REACTION: facial swelling and rash.      Medication List    STOP taking these medications   famotidine 20 MG tablet Commonly known as:  PEPCID     TAKE these medications   acetaminophen 500 MG tablet Commonly known as:  TYLENOL Take 1,000 mg by mouth every 6 (six) hours as needed for moderate pain.   cyclobenzaprine 10 MG tablet Commonly known as:  FLEXERIL Take 10 mg by mouth every 8 (eight) hours as needed for muscle spasms (Migraines).   ibuprofen 800 MG tablet Commonly known as:  ADVIL,MOTRIN Take 1 tablet (800 mg total) by mouth every 8 (eight) hours.   oxyCODONE 5 MG immediate release tablet Commonly known as:  Oxy IR/ROXICODONE Take 1 tablet (5 mg total) by mouth every 4 (four) hours as needed for severe pain.   prenatal multivitamin Tabs tablet Take 1 tablet by mouth daily at 12 noon.       Diet: routine diet  Activity: Advance as tolerated. Pelvic rest for 6 weeks.   Outpatient  follow up:2 weeks  Newborn Data: Live born female  Birth Weight: 7 lb 13.4 oz (3555 g) APGAR: 8, 9  Newborn Delivery   Birth date/time:  02/13/2018 10:21:00 Delivery type:  C-Section, Vacuum Assisted Trial of labor:  No C-section categorization:  Repeat     Baby Feeding: Breast Disposition:home with mother   02/15/2018 Clarene Duke, MD

## 2018-02-15 NOTE — Op Note (Signed)
NAME: Amanda [name], Amanda [name] MEDICAL RECORD [id number] ACCOUNT [id number] DATE OF BIRTH:Mar 25, 1980 FACILITY: [facility] LOCATION: [id number] PHYSICIAN:Fatimata Talsma Sandford Craze, MD  OPERATIVE REPORT  DATE OF PROCEDURE:  02/13/2018  PREOPERATIVE DIAGNOSES:  Intrauterine pregnancy at term, history of low transverse cesarean section, declines trial of labor after cesarean, for repeat low cesarean section, undesired fertility.    POSTOPERATIVE DIAGNOSES:   Intrauterine pregnancy at term, history of low transverse cesarean section, declines trial of labor after cesarean, for repeat low cesarean section, undesired fertility, status post repeat low transverse cesarean section,  status post bilateral tubal ligation by bilateral salpingectomy surgery.  PROCEDURE:  Low transverse cesarean section with bilateral tubal ligation by salpingectomy.  SURGEON:  Janyth Contes, MD  ASSISTANT:  None.  FINDINGS:  Viable female infant at 10:21 a.m. with Apgars of 8 at one minute and 9 at five minutes and a weight pending at the time of dictation.  Postpartum uterus, tubes and ovaries.  ANESTHESIA:  Spinal.  ESTIMATED BLOOD LOSS:  148 mL.  IV FLUID:  Per anesthesia.  URINE OUTPUT:  Per anesthesia with clear urine at the end of the procedure.  COMPLICATIONS:  None.  PATHOLOGY:  Placenta to labor and delivery, bilateral tubal segments to pathology.  DESCRIPTION OF PROCEDURE:  After informed consent was reviewed with the patient including risks, benefits and alternatives of the surgical procedure, she was transported to the operating room where spinal anesthesia was placed and found to be adequate.   She was then returned to supine position with a leftward tilt.  After an appropriate timeout and awaiting the time for the prep to dry, a Pfannenstiel skin incision was made at the level of her previous incision and carried through to the underlying  layer of fascia sharply.  The fascia was incised in the  midline.  The incision was extended laterally with Mayo scissors.  The superior aspect of the fascial incision was grasped with Kocher clamps, elevated and the rectus muscles were dissected off both  bluntly and sharply.  Midline was easily identified, and the peritoneum was entered bluntly.  The incision was extended superiorly and inferiorly with good visualization of the bladder.  An Alexis skin retractor was placed carefully, making sure that no  bowel was entrapped.  The uterus was incised in a transverse fashion.  The infant was delivered from a vertex presentation with the aid of a vacuum.  Nose and mouth were suctioned on the field.  The infant was dried.  After a minute, the cord was  clamped and cut, and the infant was handed off to the waiting NICU staff.  The placenta was expressed from the uterus.  The uterus was wiped clean.  The uterine incision was closed with 2 layers of 0 Monocryl, the first of which was running locked and  the second was an imbricating layer.  The gutters were cleared of all clot and debris.  The tubes were followed from the cornu out to the fimbriae and isolated with a Claiborne Billings and doubly ligated with plain gut.  This was noted to be hemostatic on both the  right and the left. The tubal segments were sent to pathology.  The peritoneum was reapproximated with 2-0 Vicryl in a running fashion.  The subfascial planes were inspected and found to be hemostatic.  The fascia was reapproximated with 0 Vicryl in a  running suture.  Subcuticular adipose layer was made hemostatic with Bovie cautery, and the dead space was closed with plain gut, the skin was closed  with 4-0 Vicryl in subcu fashion.  Benzoin and Steri-Strips were applied.  The patient tolerated the  procedure well.  Sponge, lap and needle counts were correct x2 per the operating room staff.  LN/NUANCE  D:02/15/2018 T:02/15/2018 JOB:[id number]

## 2018-02-16 ENCOUNTER — Encounter (HOSPITAL_COMMUNITY): Payer: Self-pay | Admitting: *Deleted

## 2018-02-26 NOTE — Discharge Summary (Signed)
Pt left floor AMA

## 2018-07-27 ENCOUNTER — Other Ambulatory Visit: Payer: Self-pay | Admitting: Neurology

## 2018-10-18 ENCOUNTER — Encounter: Payer: Self-pay | Admitting: Neurology

## 2018-10-19 ENCOUNTER — Telehealth (INDEPENDENT_AMBULATORY_CARE_PROVIDER_SITE_OTHER): Payer: Medicaid Other | Admitting: Neurology

## 2018-10-19 ENCOUNTER — Other Ambulatory Visit: Payer: Self-pay | Admitting: *Deleted

## 2018-10-19 ENCOUNTER — Other Ambulatory Visit: Payer: Self-pay

## 2018-10-19 ENCOUNTER — Encounter: Payer: Self-pay | Admitting: Neurology

## 2018-10-19 DIAGNOSIS — G43719 Chronic migraine without aura, intractable, without status migrainosus: Secondary | ICD-10-CM | POA: Diagnosis not present

## 2018-10-19 MED ORDER — SUMATRIPTAN 20 MG/ACT NA SOLN: NASAL | 3 refills | Status: DC

## 2018-10-19 MED ORDER — AIMOVIG 70 MG/ML ~~LOC~~ SOAJ: 70.0000 mg | SUBCUTANEOUS | 11 refills | Status: DC

## 2018-10-19 NOTE — Patient Instructions (Signed)
1. Due to worsening headaches, will check MRI of brain with and without contrast.  2. For migraine prevention, start Aimovig 70mg  injection every 28 days.  Get a calendar and mark the date every 28 days to remind you when you need to inject.  You may inject in the thigh or stomach.  It is an autoinjector. 3.  When you get a bad migraine, use the sumatriptan nasal spray.  Limit use to no more than 2 days out of week to prevent risk of rebound or medication-overuse headache. 4. Stop ibuprofen, acetaminophen and Flexeril 5.  Keep headache diary 6.  Exercise, hydration, caffeine cessation, sleep hygiene, monitor for and avoid triggers 7.  Consider:  magnesium citrate 400mg  daily, riboflavin 400mg  daily, and coenzyme Q10 100mg  three times daily 8. Always keep in mind that currently taking a hormone or birth control may be a possible trigger or aggravating factor for migraine. 9. Follow up 4 months.

## 2018-10-19 NOTE — Progress Notes (Signed)
Virtual Visit via Video Note The purpose of this virtual visit is to provide medical care while limiting exposure to the novel coronavirus.    Consent was obtained for video visit:  Yes.   Answered questions that patient had about telehealth interaction:  Yes.   I discussed the limitations, risks, security and privacy concerns of performing an evaluation and management service by telemedicine. I also discussed with the patient that there may be a patient responsible charge related to this service. The patient expressed understanding and agreed to proceed.  Pt location: Home Physician Location: office Name of referring provider:  Benito Mccreedy, MD I connected with Amanda [name] at patients initiation/request on 10/19/2018 at  8:30 AM EDT by video enabled telemedicine application and verified that I am speaking with the correct person using two identifiers. Pt MRN:  [id number] Pt DOB:  [birth year] Video Participants:  Amanda [name]   History of Present Illness:  Amanda [name] is a 38 year old right-handed woman with asthma, depression, anxiety and morbid obesity who follows up for chronic migraine.    UPDATE: She was last seen on 11/15/2016.  At that time, I again recommended Botox and she was agreeable.  However, she became pregnant and Botox was put on-hold.  Headaches were controlled while pregnant but return post-partum.  She is not breastfeeding.  Intensity:  10/10 Duration:  A day Frequency: daily Current headache have been ongoing for 3 days. Flexeril ineffective.  Frequency of abortive therapy:  5-6 days a week. Current NSAIDS:  Ibuprofen 800mg  Current analgesics:  Acetaminophen 500mg  Current triptans:  none Current ergotamine:  none Current anti-emetic:  none Current muscle relaxants:  Flexeril 10mg  Current anti-anxiolytic:  none Current sleep aide:  none Current Antihypertensive medications:  none Current Antidepressant medications:   none Current Anticonvulsant medications:  none Current anti-CGRP:  none Current Vitamins/Herbal/Supplements:  prenatal Current Antihistamines/Decongestants:  none Other therapy:  none Hormone/birth control:  none   Caffeine:  no Alcohol:  no Smoker:  no Diet:  Cut out pork, cheese, chocolate.  Increased water intake Exercise:  walking Depression/stress:  stress Sleep hygiene:  poor  HISTORY: Onset:  adolescence Location: Holocephalic from neck and shoulders to behind the eyes Quality: Throbbing, pressure Initial intensity: 10/10 Aura: no Prodrome: no Associated symptoms: Dizziness, nausea, photophobia, phonophobia Initial Duration: constant Initial Frequency: Daily (16-20 days severe) Triggers:  Emotional stress, getting upset, menstrual cycle Relieving factors: none Activity: Difficult to function when severe  Past NSAIDS:  Advil, Aleve Past analgesics:  BC powder Past abortive triptans:  Sumatriptan 100mg , Maxalt, Relpax, Sumatriptan NS (effective) Past abortive ergotamine:  none Past muscle relaxants:  none Past anti-emetic:  Zofran Past antihypertensive medications:  none Past antidepressant medications:  Nortriptyline 25mg  (side effects), Zoloft Past anticonvulsant medications:  topiramate 100mg  (ineffective) Past anti-CGRP:  none Past vitamins/Herbal/Supplements:  none Past antihistamines/decongestants:  none Other past therapies:  Trigger point injections  Family history of headaches:  No   Past Medical History: Past Medical History:  Diagnosis Date  . Anginal pain ([place])    r/t anxiety  . Anxiety   . Asthma    no inhaler - seasonal  . Constipation   . Depression   . Dysrhythmia    Palpatations - r/t anxiety  . Genital warts   . GERD (gastroesophageal reflux disease)   . H/O hiatal hernia    surgery to repair  . Headache(784.0)   . Heart murmur   . History of low transverse cesarean section 02/12/2018  .  MRSA cellulitis    greater  than 10 yrs ago dx at urgent care   . Sickle cell trait ([facility])     Medications: Outpatient Encounter Medications as of 10/19/2018  Medication Sig  . acetaminophen (TYLENOL) 500 MG tablet Take 1,000 mg by mouth every 6 (six) hours as needed for moderate pain.   . cyclobenzaprine (FLEXERIL) 10 MG tablet Take 10 mg by mouth every 8 (eight) hours as needed for muscle spasms (Migraines).  Marland Kitchen ibuprofen (ADVIL,MOTRIN) 800 MG tablet Take 1 tablet (800 mg total) by mouth every 8 (eight) hours.  Marland Kitchen oxyCODONE (OXY IR/ROXICODONE) 5 MG immediate release tablet Take 1 tablet (5 mg total) by mouth every 4 (four) hours as needed for severe pain.  . Prenatal Vit-Fe Fumarate-FA (PRENATAL MULTIVITAMIN) TABS tablet Take 1 tablet by mouth daily at 12 noon.   No facility-administered encounter medications on file as of 10/19/2018.     Allergies: Allergies  Allergen Reactions  . Doxycycline Hyclate Hives, Itching and Swelling  . Ketorolac Tromethamine Other (See Comments)    Patient stated that it makes her extremely hyper  . Amoxicillin Hives, Swelling and Rash    Has patient had a PCN reaction causing immediate rash, facial/tongue/throat swelling, SOB or lightheadedness with hypotension: Unsure Has patient had a PCN reaction causing severe rash involving mucus membranes or skin necrosis: Unsure Has patient had a PCN reaction that required hospitalization No Has patient had a PCN reaction occurring within the last 10 years: No If all of the above answers are "NO", then may proceed with Cephalosporin use.REACTION: facial swelling and rash.    Family History: Family History  Problem Relation Age of Onset  . Hypertension Mother   . Depression Mother   . Depression Father   . Heart disease Maternal Grandmother   . Breast cancer Maternal Aunt        [employer]  . Cancer Maternal Aunt   . Diabetes Maternal Grandfather   . Heart disease Maternal Grandfather   . Hypertension Maternal Grandfather   .  Anesthesia problems Neg Hx     Social History: Social History   Socioeconomic History  . Marital status: Single    Spouse name: Not on file  . Number of children: 1  . Years of education: 12+  . Highest education level: Not on file  Occupational History  . Not on file  Social Needs  . Financial resource strain: Not hard at all  . Food insecurity    Worry: Never true    Inability: Never true  . Transportation needs    Medical: No    Non-medical: Not on file  Tobacco Use  . Smoking status: Never Smoker  . Smokeless tobacco: Never Used  Substance and Sexual Activity  . Alcohol use: No    Alcohol/week: 0.0 standard drinks  . Drug use: No  . Sexual activity: Yes    Partners: Male    Birth control/protection: Condom  Lifestyle  . Physical activity    Days per week: Not on file    Minutes per session: Not on file  . Stress: Rather much  Relationships  . Social Herbalist on phone: Not on file    Gets together: Not on file    Attends religious service: Not on file    Active member of club or organization: Not on file    Attends meetings of clubs or organizations: Not on file    Relationship status: Not on file  .  Intimate partner violence    Fear of current or ex partner: No    Emotionally abused: No    Physically abused: No    Forced sexual activity: No  Other Topics Concern  . Not on file  Social History Narrative   Regular exercise-no   Caffeine Use-yes    Observations/Objective:   No acute distress.  Alert and oriented.  Speech fluent and not dysarthric.  Language intact.  Eyes orthophoric on primary gaze.  Face symmetric.  Assessment and Plan:   Chronic migraine without aura, without status migrainosus, intractable.  Complicated by medication-overuse.  1. Due to worsening headaches, will check MRI of brain with and without contrast.  2. For preventative management, start Aimovig 70mg  monthly 3.  For abortive therapy, refill sumatriptan NS as it  was effective.  Limit use to no more than 2 days out of week to prevent risk of rebound or medication-overuse headache. 4. Stop ibuprofen, acetaminophen and Flexeril 5.  Keep headache diary 6.  Exercise, hydration, caffeine cessation, sleep hygiene, monitor for and avoid triggers 7.  Consider:  magnesium citrate 400mg  daily, riboflavin 400mg  daily, and coenzyme Q10 100mg  three times daily 8. Always keep in mind that currently taking a hormone or birth control may be a possible trigger or aggravating factor for migraine. 9. Follow up 4 months.   Follow Up Instructions:    -I discussed the assessment and treatment plan with the patient. The patient was provided an opportunity to ask questions and all were answered. The patient agreed with the plan and demonstrated an understanding of the instructions.   The patient was advised to call back or seek an in-person evaluation if the symptoms worsen or if the condition fails to improve as anticipated. Dudley Major, DO

## 2018-10-25 ENCOUNTER — Telehealth: Payer: Self-pay | Admitting: Neurology

## 2018-10-25 DIAGNOSIS — G43719 Chronic migraine without aura, intractable, without status migrainosus: Secondary | ICD-10-CM

## 2018-10-25 NOTE — Telephone Encounter (Signed)
Patient stated she took first Aimovig 70mg  injection last Friday night after prescribed at virtual visit.  She said as soon as she took it she felt hot/light headed and has had N/V ever since. Stated for past week has felt nausous and has thrown up every morning this week when she wakes up.  States today was the first day she is able to start eating again. Asked patient if she had N/V with migraines and she said no - that this was all from the Aimovig injection (have now marked as allergy on her chart).   She was having a headache Friday night when she took the [place] and took one spray of the Sumatriptan after the injection. Did not take a second spray of if and has not had any more sumatriptan since then. States still has a headache but it is manageable now.   Main concern is still feeling bad from shot and that she is having MRI Sunday. She is nervous about the MRI about getting the IV for contrast. Also had an MRI in past for knee that was hard for her to be still and is requesting something to help her relax for the MRI. She stated she will have a driver and uses [facility] if something can be called in.   Also - can she have something to help with nausea?  Told patient will pass on info to MD and call her back tomorrow - verbalized understanding.

## 2018-10-25 NOTE — Telephone Encounter (Signed)
Patient left msg with after hours that she would like medication before MRI on Sunday- she is experiencing light headedness, vomiting, nausea since she has been given injection for headache. After injection she took oral medication and headache got better but continues the symptoms above.

## 2018-10-26 ENCOUNTER — Other Ambulatory Visit: Payer: Self-pay | Admitting: Neurology

## 2018-10-26 MED ORDER — DIAZEPAM 5 MG PO TABS: ORAL_TABLET | ORAL | 0 refills | Status: DC

## 2018-10-26 MED ORDER — PROPRANOLOL HCL ER 60 MG PO CP24: 60.0000 mg | ORAL_CAPSULE | Freq: Every day | ORAL | 3 refills | Status: DC

## 2018-10-26 MED ORDER — ONDANSETRON 8 MG PO TBDP: 8.0000 mg | ORAL_TABLET | Freq: Three times a day (TID) | ORAL | 0 refills | Status: DC | PRN

## 2018-10-26 NOTE — Telephone Encounter (Signed)
Called patient back and she wants to use the dissolvable zofran . She had to use that for both of her pregnancies. Entered order for 8mg  Zofran Q8 hours prn and warning came up if breastfeeding may be contraindicated. Called patient as she has an 41 mos old but she is NOT breastfeeding. Med sent to pharmacy.   Informed her about the valium has been called in to pharmacy by MD and to take 30 min prior to MRI and have driver. Told patient if anti nausea med is not working then she should probably call [facility] and reschedule MRI to a later date if she is not going to be able to comfortably have MRI due to N/V. Verbalized understanding.  Also explained to stop the aimovig injection and start the propranolol 60 mg ER daily. Gave her all the instructions per MD phone note today below (0414 am) regarding being aware of  Lightheadedness/ calling us if not headache not improved. Verbalized understanding.    Again reviewed sumatriptan nasal spray that she can take another spray 2 hours after first one if needed.   [facility]ified [facility].

## 2018-10-26 NOTE — Telephone Encounter (Signed)
If she thinks she can't keep down pills, we can prescribe her promethazine 50mg  suppository. If she can tolerate oral, we can prescribe Zofran-ODT 8mg  (dissolvable) Quantity 20 for each.

## 2018-10-26 NOTE — Telephone Encounter (Signed)
Saw your meds below called in thanks- she was throwing up when I called her this am. Is there anything she can have for N/V as states this has been going on a week since the Aimovig injection?

## 2018-10-26 NOTE — Telephone Encounter (Signed)
Regarding the sumatriptan nasal spray, she told me it was effective in the past.  She should be sure to take it at earliest onset of migraine and she has the option to repeat it once after 2 hours if needed.

## 2018-10-26 NOTE — Telephone Encounter (Signed)
Left message with patient to call us back today so can give her MD information regarding the valium and propranolol ER called in and also to ask here which route she prefers for her anti-nausea (dissolvable pill or suppository). Asked patient to please call back as soon as she gets this message so she can have her valium pre med requested for her MRI Sunday.

## 2018-10-26 NOTE — Telephone Encounter (Signed)
I sent in prescription for Valium.  She is to take it 30 to 45 minutes prior to scheduled MRI  We will no longer have her take Aimovig.  Instead, I also placed an order for propranolol ER 60mg  daily.  If by the end of the month (when she has about 5 pills left) and headaches not improved, she should contact me and we can increase dose.  It is originally used to lower high blood pressure and high heart rate, so she should monitor for lightheadedness.

## 2018-11-04 ENCOUNTER — Other Ambulatory Visit: Payer: Self-pay

## 2018-11-04 ENCOUNTER — Ambulatory Visit
Admission: RE | Admit: 2018-11-04 | Discharge: 2018-11-04 | Disposition: A | Discharge status: Home / Self Care | Payer: Medicaid Other | Source: Ambulatory Visit | Attending: Neurology | Admitting: Neurology

## 2018-11-04 DIAGNOSIS — G43719 Chronic migraine without aura, intractable, without status migrainosus: Secondary | ICD-10-CM

## 2018-11-04 MED ORDER — GADOBENATE DIMEGLUMINE 529 MG/ML IV SOLN
20.0000 mL | Freq: Once | INTRAVENOUS | Status: AC | PRN
  Administered 2018-11-04: 20 mL via INTRAVENOUS

## 2018-11-06 ENCOUNTER — Encounter: Payer: Self-pay | Admitting: Sports Medicine

## 2018-11-06 ENCOUNTER — Other Ambulatory Visit: Payer: Self-pay

## 2018-11-06 ENCOUNTER — Other Ambulatory Visit: Payer: Self-pay | Admitting: Sports Medicine

## 2018-11-06 ENCOUNTER — Ambulatory Visit: Payer: Medicaid Other

## 2018-11-06 ENCOUNTER — Ambulatory Visit: Payer: Medicaid Other | Admitting: Sports Medicine

## 2018-11-06 ENCOUNTER — Ambulatory Visit (INDEPENDENT_AMBULATORY_CARE_PROVIDER_SITE_OTHER): Payer: Medicaid Other

## 2018-11-06 ENCOUNTER — Telehealth: Payer: Self-pay | Admitting: *Deleted

## 2018-11-06 DIAGNOSIS — D696 Thrombocytopenia, unspecified: Secondary | ICD-10-CM | POA: Insufficient documentation

## 2018-11-06 DIAGNOSIS — B351 Tinea unguium: Secondary | ICD-10-CM | POA: Diagnosis not present

## 2018-11-06 DIAGNOSIS — M779 Enthesopathy, unspecified: Secondary | ICD-10-CM

## 2018-11-06 DIAGNOSIS — M722 Plantar fascial fibromatosis: Secondary | ICD-10-CM

## 2018-11-06 DIAGNOSIS — M79671 Pain in right foot: Secondary | ICD-10-CM

## 2018-11-06 DIAGNOSIS — B353 Tinea pedis: Secondary | ICD-10-CM

## 2018-11-06 DIAGNOSIS — O99119 Other diseases of the blood and blood-forming organs and certain disorders involving the immune mechanism complicating pregnancy, unspecified trimester: Secondary | ICD-10-CM | POA: Insufficient documentation

## 2018-11-06 DIAGNOSIS — M79672 Pain in left foot: Secondary | ICD-10-CM | POA: Diagnosis not present

## 2018-11-06 DIAGNOSIS — G43719 Chronic migraine without aura, intractable, without status migrainosus: Secondary | ICD-10-CM

## 2018-11-06 MED ORDER — NYSTATIN-TRIAMCINOLONE 100000-0.1 UNIT/GM-% EX OINT: 1.0000 "application " | TOPICAL_OINTMENT | Freq: Two times a day (BID) | CUTANEOUS | 0 refills | Status: DC

## 2018-11-06 MED ORDER — TRIAMCINOLONE ACETONIDE 10 MG/ML IJ SUSP
10.0000 mg | Freq: Once | INTRAMUSCULAR | Status: AC
  Administered 2018-11-06: 10 mg

## 2018-11-06 MED ORDER — CLOTRIMAZOLE 1 % EX SOLN: 1.0000 "application " | Freq: Two times a day (BID) | CUTANEOUS | 5 refills | Status: DC

## 2018-11-06 NOTE — Telephone Encounter (Signed)
Start venlafaxine XR 37.5mg  daily for one week, then increase to 75mg  daily.

## 2018-11-06 NOTE — Progress Notes (Signed)
Patient ID: Amanda [name], female   DOB: 01-13-1981, 38 y.o.   MRN: [id number] Subjective: Amanda [name] is a 38 y.o. female patient who returns to office for evaluation of Right> Left foot pain. Patient states that she continues to have pain in Right>Left foot at entire plantar surfaces of both feet that sometimes radiates up the back of the heel and sometimes radiates out to her toes reports that it is similar to before but has slowly gotten worse with a little bit of tingling.  Patient also admits that there is some dry peeling skin to both feet that is cracking in nature has tried over-the-counter lotions and creams and has also noticed that there has been nail changes as well without any improvement with pedicures. Patient denies any other pedal complaints.   Review of Systems  All other systems reviewed and are negative.   Patient Active Problem List   Diagnosis Date Noted  . Benign gestational thrombocytopenia ([place]) 11/06/2018  . Status post repeat low transverse cesarean section 02/13/2018  . History of low transverse cesarean section 02/12/2018  . Decreased fetal movement 02/07/2018  . Migraine 09/03/2017  . Maternal age 3+, multigravida, antepartum 07/24/2017  . Maternal obesity affecting pregnancy, antepartum 07/24/2017  . Constipation 07/24/2017  . Sickle cell trait ([place]) 07/24/2017  . 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 Internal derangement of right knee 03/13/2017  . 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 Morning sickness 01/05/2017  . Abdominal pain affecting pregnancy 01/05/2017  . Suprapubic pain 01/05/2017  . Bacterial vaginitis 12/08/2016  . Low back pain 12/16/2015  . H. pylori infection 11/02/2015  . Intractable chronic migraine without aura and without status migrainosus 03/19/2015  . Morbid obesity ([place]) 03/19/2015  . Murmur 06/20/2012  . INSOMNIA 08/14/2009  . DYSMENORRHEA 12/09/2008  . FATIGUE 03/25/2008  . DEPRESSION/ANXIETY 09/11/2007  .  HEADACHE 09/11/2007  . PALPITATIONS 02/23/2007  . CHEST PAIN 02/23/2007  . ALLERGIC RHINITIS 10/06/2006  . ASTHMA 10/06/2006  . GERD 10/06/2006   Current Outpatient Medications on File Prior to Visit  Medication Sig Dispense Refill  . albuterol (VENTOLIN HFA) 108 (90 Base) MCG/ACT inhaler TAKE 2 PUFFS BY MOUTH 4 TIMES A DAY    . cetirizine (ZYRTEC) 10 MG tablet Take 10 mg by mouth daily.    . cyclobenzaprine (FLEXERIL) 10 MG tablet Take 10 mg by mouth every 8 (eight) hours as needed for muscle spasms (Migraines).    . diazepam (VALIUM) 5 MG tablet Take 1 tablet 30 to 45 minutes prior to MRI 1 tablet 0  . famotidine (PEPCID) 20 MG tablet Take 20 mg by mouth 2 (two) times daily.    Marland Kitchen omeprazole (PRILOSEC) 40 MG capsule Take by mouth daily.    . ondansetron (ZOFRAN ODT) 8 MG disintegrating tablet Take 1 tablet (8 mg total) by mouth every 8 (eight) hours as needed for nausea or vomiting. 20 tablet 0  . propranolol ER (INDERAL LA) 60 MG 24 hr capsule Take 1 capsule (60 mg total) by mouth daily. 30 capsule 3  . SUMAtriptan ([name]) 20 MG/ACT nasal spray 1 spray in nostril.  May repeat x1 in 2 hours if headache persists or recurs. 10 Inhaler 3   No current facility-administered medications on file prior to visit.    Allergies  Allergen Reactions  . Aimovig [Erenumab-Aooe]     Hot/lightheaded/nausea and vomiting  . Doxycycline Hyclate Hives, Itching and Swelling  . Ketorolac Tromethamine Other (See Comments)    Patient stated that it makes  her extremely hyper  . Amoxicillin Hives, Swelling and Rash    Has patient had a PCN reaction causing immediate rash, facial/tongue/throat swelling, SOB or lightheadedness with hypotension: Unsure Has patient had a PCN reaction causing severe rash involving mucus membranes or skin necrosis: Unsure Has patient had a PCN reaction that required hospitalization No Has patient had a PCN reaction occurring within the last 10 years: No If all of the above  answers are "NO", then may proceed with Cephalosporin use.REACTION: facial swelling and rash.     Objective:  General: Alert and oriented x3 in no acute distress, Obese   Dermatology: No open lesions bilateral lower extremities, no webspace macerations, no ecchymosis bilateral, early skin bilateral feet in a moccasin distribution supportive of tinea, all nails x 10 are well manicured polish with thickening noted of bilateral hallux.  Vascular: Dorsalis Pedis and Posterior Tibial pedal pulses 2/4, Capillary Fill Time 3 seconds,(+) pedal hair growth bilateral, no edema bilateral lower extremities, Temperature gradient within normal limits.  Neurology: Gross sensation intact via light touch bilateral,  Deep tendon reflexes within normal limits bilateral, No babinski sign present bilateral. (- )Tinels sign.   Musculoskeletal: Mild to moderate tenderness with palpation at distal mid arch and plantar medial heel on Right>Left,No pain with calf compression bilateral. Ankle and pedal joint range of motion is within normal limits, there is no 1st ray hypermobility noted bilateral, decreased 1st MPJ rom Right>Left with functional limitus and moderate Pes planus noted on weightbearing exam.Strength within normal limits in all groups bilateral.   X-rays left and right foot consistent with pes planus with midtarsal breech and inferior and early posterior heel spur.  Mild first metatarsophalangeal joint dorsal bone spur on right.  No other acute findings.   Assessment and Plan: Problem List Items Addressed This Visit    None    Visit Diagnoses    Plantar fasciitis    -  Primary   Relevant Medications   triamcinolone acetonide (KENALOG) 10 MG/ML injection 10 mg (Completed) (Start on 11/06/2018 10:15 PM)   Left foot pain       Relevant Medications   triamcinolone acetonide (KENALOG) 10 MG/ML injection 10 mg (Completed) (Start on 11/06/2018 10:15 PM)   Nail fungus       Relevant Medications    nystatin-triamcinolone ointment (MYCOLOG)   clotrimazole (LOTRIMIN) 1 % external solution   Other Relevant Orders   Hepatic Function Panel   Tinea pedis, unspecified laterality       Relevant Medications   nystatin-triamcinolone ointment (MYCOLOG)   clotrimazole (LOTRIMIN) 1 % external solution   Other Relevant Orders   Hepatic Function Panel   Right foot pain       Relevant Medications   triamcinolone acetonide (KENALOG) 10 MG/ML injection 10 mg (Completed) (Start on 11/06/2018 10:15 PM)      -Complete examination performed -X-rays reviewed -Discussed treatment options for fasciitis and occassional foot pain secondary to foot type/structure/pes planus and tinea -After oral consent and aseptic prep, injected a mixture containing 1 ml of 2%  plain lidocaine, 1 ml 0.5% plain marcaine, 0.5 ml of kenalog 10 and 0.5 ml of dexamethasone phosphate into right and left heels at glabrous junction without complication. Post-injection care discussed with patient.  -Recommend daily icing and stretching exercises -Recommend good supportive shoes daily -Recommend patient to get OTC inserts -Clotrimazole and nystatin ointment to use as instructed ordered liver function test and advised patient if normal will start her on oral Lamisil to help with  skin and nail changes -Patient to return to office in 4 weeks  or sooner if condition worsens.  Landis Martins, DPM

## 2018-11-06 NOTE — Telephone Encounter (Addendum)
Called patient with below results - verbalized understanding. Front desk to call patient for 4 mos F/U.  She also stated when she went to pick up the new medication (propranolol) the pharmacist stated it may make her asthma worse so she wanted to ask Dr. Tomi Likens if there is something else she could take instead.   ----- Message from Pieter Partridge, DO sent at 11/05/2018  7:41 AM EDT -----     MRI of brain looks normal.  She should schedule a follow up with me in 4 months

## 2018-11-06 NOTE — Patient Instructions (Signed)

## 2018-11-08 ENCOUNTER — Other Ambulatory Visit: Payer: Self-pay | Admitting: Sports Medicine

## 2018-11-08 ENCOUNTER — Telehealth: Payer: Self-pay

## 2018-11-08 LAB — HEPATIC FUNCTION PANEL
AG Ratio: 1.6 (calc) (ref 1.0–2.5)
ALT: 11 U/L (ref 6–29)
AST: 12 U/L (ref 10–30)
Albumin: 4.4 g/dL (ref 3.6–5.1)
Alkaline phosphatase (APISO): 55 U/L (ref 31–125)
Bilirubin, Direct: 0.1 mg/dL (ref 0.0–0.2)
Globulin: 2.7 g/dL (calc) (ref 1.9–3.7)
Indirect Bilirubin: 0.2 mg/dL (calc) (ref 0.2–1.2)
Total Bilirubin: 0.3 mg/dL (ref 0.2–1.2)
Total Protein: 7.1 g/dL (ref 6.1–8.1)

## 2018-11-08 MED ORDER — TERBINAFINE HCL 250 MG PO TABS: 250.0000 mg | ORAL_TABLET | Freq: Every day | ORAL | 0 refills | Status: DC

## 2018-11-08 NOTE — Telephone Encounter (Signed)
-----   Message from Landis Martins, [place] sent at 11/08/2018  8:03 AM EDT ----- Assunta Found will you let patient know that I sent lamisil to pharmacy her LFTs are normal

## 2018-11-08 NOTE — Progress Notes (Signed)
lamisil sent to pharmacy LFT normal

## 2018-11-08 NOTE — Telephone Encounter (Signed)
Spoke with Pt about LFTs and let the Pt know about Lamisil Rx that was sent to her pharmacy. Pt stated understanding

## 2018-11-09 MED ORDER — VENLAFAXINE HCL ER 37.5 MG PO CP24: 37.5000 mg | ORAL_CAPSULE | Freq: Every day | ORAL | 0 refills | Status: DC

## 2018-11-09 NOTE — Addendum Note (Signed)
Addended by: Jesse Fall on: 11/09/2018 12:15 PM   Modules accepted: Orders

## 2018-11-09 NOTE — Telephone Encounter (Addendum)
Spoke to patient and she wants to start the venlafaxine XR 37.5 mg daily for one week then  Increase to 75mg /day. Verbalized understanding and has not taken any propranolol and will not. Knows this is instead of the propranolol she did not want to take  (d/c in computer). Sending into correct pharmacy on file.   She is aware of her 4 mos f/u appt.   Called patient back and  wanted to make sure she knew the script was written for the 37.5 as they are capsules and can't be broken. Lower dosage the first week. Then patient will take 2 tabs the second week on.   If this works she is to call us back at the end of the prescription and 75mg  daily will be ordered for the second month on.  Also wanted to make sure patient isn't breastfeeding as there is a warning of taking venlafaxine if breastfeeding. She is not.  Verbalizes understanding of all above.

## 2018-12-07 ENCOUNTER — Ambulatory Visit: Payer: Self-pay | Admitting: Cardiology

## 2018-12-07 DIAGNOSIS — E782 Mixed hyperlipidemia: Secondary | ICD-10-CM | POA: Insufficient documentation

## 2018-12-07 DIAGNOSIS — Z136 Encounter for screening for cardiovascular disorders: Secondary | ICD-10-CM | POA: Insufficient documentation

## 2018-12-07 NOTE — Progress Notes (Deleted)
Patient referred by Trey Sailors, PA for screening for cardiac disease  Subjective:   Amanda [name], female    DOB: 03/27/1980, 38 y.o.   MRN: [id number]  *** No chief complaint on file.   *** HPI  38 year old African-American female with depression, anxiety, asthma, morbid obesity, referred for evaluation of screening for cardiac disease.  Referral notes mention regarding patient having "history of heart disease".    *** Past Medical History:  Diagnosis Date  . Anginal pain ([place])    r/t anxiety  . Anxiety   . Asthma    no inhaler - seasonal  . Constipation   . Depression   . Dysrhythmia    Palpatations - r/t anxiety  . Genital warts   . GERD (gastroesophageal reflux disease)   . H/O hiatal hernia    surgery to repair  . Headache(784.0)   . Heart murmur   . History of low transverse cesarean section 02/12/2018  . MRSA cellulitis    greater than 10 yrs ago dx at urgent care   . Sickle cell trait ([facility])     *** Past Surgical History:  Procedure Laterality Date  . CERVICAL CONE BIOPSY    . CESAREAN SECTION  01/20/2012   Procedure: CESAREAN SECTION;  Surgeon: Melina Schools, MD;  Location: [facility];  Service: Obstetrics;  Laterality: N/A;  . CESAREAN SECTION WITH BILATERAL TUBAL LIGATION N/A 02/13/2018   Procedure: CESAREAN SECTION WITH BILATERAL TUBAL LIGATION;  Surgeon: Janyth Contes, MD;  Location: [facility];  Service: Obstetrics;  Laterality: N/A;  [name], RNFA  Needs [name]  . COLONOSCOPY    . DILATION AND EVACUATION N/A 03/30/2017   Procedure: DILATATION AND EVACUATION WITH ULTRASOUND GUIDANCE;  Surgeon: Janyth Contes, MD;  Location: [facility];  Service: Gynecology;  Laterality: N/A;  . HERNIA REPAIR    . INSERTION OF MESH N/A 05/15/2015   Procedure: INSERTION OF MESH;  Surgeon: Ralene Ok, MD;  Location: [facility];  Service: General;  Laterality: N/A;  . MARSUPIALIZATION URETHRAL DIVERTICULUM    . UMBILICAL HERNIA  REPAIR N/A 05/15/2015   Procedure: LAPAROSCOPIC UMBILICAL HERNIA;  Surgeon: Ralene Ok, MD;  Location: [facility];  Service: General;  Laterality: N/A;  . UPPER GI ENDOSCOPY    . WISDOM TOOTH EXTRACTION      *** Social History   Socioeconomic History  . Marital status: Single    Spouse name: Not on file  . Number of children: 1  . Years of education: 12+  . Highest education level: Not on file  Occupational History  . Not on file  Social Needs  . Financial resource strain: Not hard at all  . Food insecurity    Worry: Never true    Inability: Never true  . Transportation needs    Medical: No    Non-medical: Not on file  Tobacco Use  . Smoking status: Never Smoker  . Smokeless tobacco: Never Used  Substance and Sexual Activity  . Alcohol use: No    Alcohol/week: 0.0 standard drinks  . Drug use: No  . Sexual activity: Yes    Partners: Male    Birth control/protection: Condom  Lifestyle  . Physical activity    Days per week: Not on file    Minutes per session: Not on file  . Stress: Rather much  Relationships  . Social Herbalist on phone: Not on file    Gets together: Not on file    Attends  religious service: Not on file    Active member of club or organization: Not on file    Attends meetings of clubs or organizations: Not on file    Relationship status: Not on file  . Intimate partner violence    Fear of current or ex partner: No    Emotionally abused: No    Physically abused: No    Forced sexual activity: No  Other Topics Concern  . Not on file  Social History Narrative   Regular exercise-no   Caffeine Use-yes    *** Family History  Problem Relation Age of Onset  . Hypertension Mother   . Depression Mother   . Depression Father   . Heart disease Maternal Grandmother   . Breast cancer Maternal Aunt        [employer]  . Cancer Maternal Aunt   . Diabetes Maternal Grandfather   . Heart disease Maternal Grandfather   . Hypertension Maternal  Grandfather   . Anesthesia problems Neg Hx     *** Current Outpatient Medications on File Prior to Visit  Medication Sig Dispense Refill  . albuterol (VENTOLIN HFA) 108 (90 Base) MCG/ACT inhaler TAKE 2 PUFFS BY MOUTH 4 TIMES A DAY    . cetirizine (ZYRTEC) 10 MG tablet Take 10 mg by mouth daily.    . clotrimazole (LOTRIMIN) 1 % external solution Apply 1 application topically 2 (two) times daily. In between toes 60 mL 5  . cyclobenzaprine (FLEXERIL) 10 MG tablet Take 10 mg by mouth every 8 (eight) hours as needed for muscle spasms (Migraines).    . diazepam (VALIUM) 5 MG tablet Take 1 tablet 30 to 45 minutes prior to MRI 1 tablet 0  . famotidine (PEPCID) 20 MG tablet Take 20 mg by mouth 2 (two) times daily.    Marland Kitchen nystatin-triamcinolone ointment (MYCOLOG) Apply 1 application topically 2 (two) times daily. 30 g 0  . omeprazole (PRILOSEC) 40 MG capsule Take by mouth daily.    . ondansetron (ZOFRAN ODT) 8 MG disintegrating tablet Take 1 tablet (8 mg total) by mouth every 8 (eight) hours as needed for nausea or vomiting. 20 tablet 0  . SUMAtriptan ([name]) 20 MG/ACT nasal spray 1 spray in nostril.  May repeat x1 in 2 hours if headache persists or recurs. 10 Inhaler 3  . terbinafine (LAMISIL) 250 MG tablet Take 1 tablet (250 mg total) by mouth daily. 90 tablet 0  . venlafaxine XR (EFFEXOR XR) 37.5 MG 24 hr capsule Take 1 capsule (37.5 mg total) by mouth daily with breakfast. After the first week take 2 tabs (total of 75 mg daily) 51 capsule 0   No current facility-administered medications on file prior to visit.     Cardiovascular studies:  ***  *** Recent labs: 02/27/2018: H/H 11/33.  MCV 76.  Platelets 183. Cholesterol 221, triglycerides 89, HDL 52, LDL 151. Hemoglobin A1c 4.9%. TSH 0.3 low, free T4 1.4 normal. Vitamin D 11.9 very low.    *** ROS      *** There were no vitals filed for this visit.   There is no height or weight on file to calculate BMI. There were no vitals  filed for this visit.  *** Objective:   Physical Exam        Assessment & Recommendations:   ***  ***   Thank you for referring the patient to [place]. Please feel free to contact with any questions.  Nigel Mormon, MD [facility] Pager: [phone] Office:  [phone]    

## 2018-12-11 ENCOUNTER — Encounter: Payer: Self-pay | Admitting: Sports Medicine

## 2018-12-11 ENCOUNTER — Other Ambulatory Visit: Payer: Self-pay

## 2018-12-11 ENCOUNTER — Ambulatory Visit: Payer: Medicaid Other | Admitting: Sports Medicine

## 2018-12-11 DIAGNOSIS — B351 Tinea unguium: Secondary | ICD-10-CM | POA: Diagnosis not present

## 2018-12-11 DIAGNOSIS — B353 Tinea pedis: Secondary | ICD-10-CM | POA: Diagnosis not present

## 2018-12-11 DIAGNOSIS — M79671 Pain in right foot: Secondary | ICD-10-CM

## 2018-12-11 DIAGNOSIS — M79672 Pain in left foot: Secondary | ICD-10-CM | POA: Diagnosis not present

## 2018-12-11 DIAGNOSIS — M722 Plantar fascial fibromatosis: Secondary | ICD-10-CM

## 2018-12-11 NOTE — Progress Notes (Addendum)
Subjective: Amanda [name] is a 38 y.o. female returns to office for follow up evaluation after Left and Right heel injection for plantar fasciitis, injection #1 administered 4 weeks ago. Patient states that the injection seems to help her pain; pain is now and has decreased in frequency to the areas.  Patient admits that she has been using topical solution to her nails and started the oral Lamisil but to be honest only took a few pills reports that the ointment was not available at her pharmacy.  Patient denies any recent changes in medications or new problems since last visit.   Patient Active Problem List   Diagnosis Date Noted  . Screening for cardiovascular condition 12/07/2018  . Mixed hyperlipidemia 12/07/2018  . Benign gestational thrombocytopenia ([place]) 11/06/2018  . Status post repeat low transverse cesarean section 02/13/2018  . History of low transverse cesarean section 02/12/2018  . Decreased fetal movement 02/07/2018  . Migraine 09/03/2017  . Maternal age 27+, multigravida, antepartum 07/24/2017  . Maternal obesity affecting pregnancy, antepartum 07/24/2017  . Constipation 07/24/2017  . Sickle cell trait ([place]) 07/24/2017  . 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 Internal derangement of right knee 03/13/2017  . 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 Morning sickness 01/05/2017  . Abdominal pain affecting pregnancy 01/05/2017  . Suprapubic pain 01/05/2017  . Bacterial vaginitis 12/08/2016  . Low back pain 12/16/2015  . H. pylori infection 11/02/2015  . Intractable chronic migraine without aura and without status migrainosus 03/19/2015  . Morbid obesity ([place]) 03/19/2015  . Murmur 06/20/2012  . INSOMNIA 08/14/2009  . DYSMENORRHEA 12/09/2008  . FATIGUE 03/25/2008  . DEPRESSION/ANXIETY 09/11/2007  . HEADACHE 09/11/2007  . PALPITATIONS 02/23/2007  . CHEST PAIN 02/23/2007  . ALLERGIC RHINITIS 10/06/2006  . ASTHMA 10/06/2006  . GERD 10/06/2006    Current Outpatient  Medications on File Prior to Visit  Medication Sig Dispense Refill  . albuterol (VENTOLIN HFA) 108 (90 Base) MCG/ACT inhaler TAKE 2 PUFFS BY MOUTH 4 TIMES A DAY    . cetirizine (ZYRTEC) 10 MG tablet Take 10 mg by mouth daily.    . clotrimazole (LOTRIMIN) 1 % external solution Apply 1 application topically 2 (two) times daily. In between toes 60 mL 5  . cyclobenzaprine (FLEXERIL) 10 MG tablet Take 10 mg by mouth every 8 (eight) hours as needed for muscle spasms (Migraines).    . diazepam (VALIUM) 5 MG tablet Take 1 tablet 30 to 45 minutes prior to MRI 1 tablet 0  . famotidine (PEPCID) 20 MG tablet Take 20 mg by mouth 2 (two) times daily.    Marland Kitchen nystatin-triamcinolone ointment (MYCOLOG) Apply 1 application topically 2 (two) times daily. 30 g 0  . omeprazole (PRILOSEC) 40 MG capsule Take by mouth daily.    . ondansetron (ZOFRAN ODT) 8 MG disintegrating tablet Take 1 tablet (8 mg total) by mouth every 8 (eight) hours as needed for nausea or vomiting. 20 tablet 0  . SUMAtriptan ([name]) 20 MG/ACT nasal spray 1 spray in nostril.  May repeat x1 in 2 hours if headache persists or recurs. 10 Inhaler 3  . terbinafine (LAMISIL) 250 MG tablet Take 1 tablet (250 mg total) by mouth daily. 90 tablet 0  . venlafaxine XR (EFFEXOR XR) 37.5 MG 24 hr capsule Take 1 capsule (37.5 mg total) by mouth daily with breakfast. After the first week take 2 tabs (total of 75 mg daily) 51 capsule 0   No current facility-administered medications on file prior to visit.  Allergies  Allergen Reactions  . Aimovig [Erenumab-Aooe]     Hot/lightheaded/nausea and vomiting  . Doxycycline Hyclate Hives, Itching and Swelling  . Ketorolac Tromethamine Other (See Comments)    Patient stated that it makes her extremely hyper  . Amoxicillin Hives, Swelling and Rash    Has patient had a PCN reaction causing immediate rash, facial/tongue/throat swelling, SOB or lightheadedness with hypotension: Unsure Has patient had a PCN reaction  causing severe rash involving mucus membranes or skin necrosis: Unsure Has patient had a PCN reaction that required hospitalization No Has patient had a PCN reaction occurring within the last 10 years: No If all of the above answers are "NO", then may proceed with Cephalosporin use.REACTION: facial swelling and rash.    Objective:   General:  Alert and oriented x 3, in no acute distress  Dermatology: Skin is warm, dry, and supple bilateral. Nails are within short and thick with decreased scaling plantar surfaces consistent with tinea pedis, there is no lower extremity erythema, no eccymosis, no open lesions present bilateral.   Vascular: Dorsalis Pedis and Posterior Tibial pedal pulses are 1/4 bilateral. + hair growth noted bilateral. Capillary Fill Time is 3 seconds in all digits. No varicosities, No edema bilateral lower extremities.   Neurological: Sensation grossly intact to light touch with an achilles reflex of +2 and a negative Tinel's sign bilateral. Vibratory, sharp/dull, Semmes Weinstein Monofilament within normal limits.   Musculoskeletal: There is decreased tenderness to palpation at the medial calcaneal tubercale and through the insertion of the plantar fascia on the Left>right foot. + Pes planus. No pain with compression to calcaneus or application of tuning fork. There is decreased Ankle joint range of motion bilateral. All other jointsrange of motion  within normal limits bilateral. Strength 5/5 bilateral.   Assessment and Plan: Problem List Items Addressed This Visit    None    Visit Diagnoses    Tinea pedis, unspecified laterality    -  Primary   Relevant Orders   CBC with Differential   Hepatic Function Panel   Nail fungus       Left foot pain       Right foot pain       Plantar fasciitis          -Complete examination performed.  -Previous x-rays reviewed. -Discussed with patient in detail the condition of plantar fasciitis and tinea, how this occurs related to  the foot type of the patient and general treatment options. - Patient declined repeat injection at this time -Continue with stretching, icing, good supportive shoes, inserts daily.  -Discussed long term care and reocurrence; will closely monitor; if fails to improve will consider other treatment modalities.  -Dispensed foot miracle cream to use for dry skin advised patient if works well may purchase from office -Continue with PO Lamisil and clotrimazole solution to toes daily -Advised patient to get updated blood work prior to next visit for medication check on Lamisil started 11/08/2018 -Patient to return to office in 4-6 weeks for follow up or sooner if problems or questions arise.  Landis Martins, DPM

## 2018-12-25 NOTE — Progress Notes (Addendum)
Patient referred by Trey Sailors, PA for chest pain  Subjective:   Amanda [name], female    DOB: March 08, 1980, 38 y.o.   MRN: [id number]   Chief Complaint  Patient presents with  . New Patient (Initial Visit)  . Chest Pain     HPI   38 y.o. African American female with chest pain  Pain if retrosternal, brought upon by emotional stress, lasts for up to several hours. Pain does not come on with physical activity. Reviewed lipid panel.   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 She does not have any significant exertional dyspnea, orthopnea symptoms.   Past Medical History:  Diagnosis Date  . Anginal pain ([place])    r/t anxiety  . Anxiety   . Asthma    no inhaler - seasonal  . Constipation   . Depression   . Dysrhythmia    Palpatations - r/t anxiety  . Genital warts   . GERD (gastroesophageal reflux disease)   . H/O hiatal hernia    surgery to repair  . Headache(784.0)   . Heart murmur   . History of low transverse cesarean section 02/12/2018  . MRSA cellulitis    greater than 10 yrs ago dx at urgent care   . Sickle cell trait [facility])      Past Surgical History:  Procedure Laterality Date  . CERVICAL CONE BIOPSY    . CESAREAN SECTION  01/20/2012   Procedure: CESAREAN SECTION;  Surgeon: Melina Schools, MD;  Location: [facility];  Service: Obstetrics;  Laterality: N/A;  . CESAREAN SECTION WITH BILATERAL TUBAL LIGATION N/A 02/13/2018   Procedure: CESAREAN SECTION WITH BILATERAL TUBAL LIGATION;  Surgeon: Janyth Contes, MD;  Location: [facility];  Service: Obstetrics;  Laterality: N/A;  [name], RNFA  Needs [name]  . COLONOSCOPY    . DILATION AND EVACUATION N/A 03/30/2017   Procedure: DILATATION AND EVACUATION WITH ULTRASOUND GUIDANCE;  Surgeon: Janyth Contes, MD;  Location: [facility];  Service: Gynecology;  Laterality: N/A;  . HERNIA REPAIR    . INSERTION OF MESH N/A 05/15/2015   Procedure: INSERTION OF MESH;  Surgeon: Ralene Ok, MD;  Location: [facility];  Service: General;  Laterality: N/A;  . MARSUPIALIZATION URETHRAL DIVERTICULUM    . UMBILICAL HERNIA REPAIR N/A 05/15/2015   Procedure: LAPAROSCOPIC UMBILICAL HERNIA;  Surgeon: Ralene Ok, MD;  Location: [facility];  Service: General;  Laterality: N/A;  . UPPER GI ENDOSCOPY    . WISDOM TOOTH EXTRACTION       Social History   Tobacco Use  Smoking Status Never Smoker  Smokeless Tobacco Never Used    Social History   Substance and Sexual Activity  Alcohol Use No  . Alcohol/week: 0.0 standard drinks     Family History  Problem Relation Age of Onset  . Hypertension Mother   . Depression Mother   . Depression Father   . Heart disease Maternal Grandmother   . Breast cancer Maternal Aunt        [employer]  . Cancer Maternal Aunt   . Diabetes Maternal Grandfather   . Heart disease Maternal Grandfather   . Hypertension Maternal Grandfather   . Anesthesia problems Neg Hx      Current Outpatient Medications on File Prior to Visit  Medication Sig Dispense Refill  . albuterol (VENTOLIN HFA) 108 (90 Base) MCG/ACT inhaler TAKE 2 PUFFS BY MOUTH 4 TIMES A DAY    .  cetirizine (ZYRTEC) 10 MG tablet Take 10 mg by mouth daily.    . clotrimazole (LOTRIMIN) 1 % external solution Apply 1 application topically 2 (two) times daily. In between toes 60 mL 5  . cyclobenzaprine (FLEXERIL) 10 MG tablet Take 10 mg by mouth every 8 (eight) hours as needed for muscle spasms (Migraines).    . diazepam (VALIUM) 5 MG tablet Take 1 tablet 30 to 45 minutes prior to MRI 1 tablet 0  . famotidine (PEPCID) 20 MG tablet Take 20 mg by mouth 2 (two) times daily.    Marland Kitchen nystatin-triamcinolone ointment (MYCOLOG) Apply 1 application topically 2 (two) times daily. 30 g 0  . omeprazole (PRILOSEC) 40 MG capsule Take by mouth daily.    . ondansetron (ZOFRAN ODT) 8 MG disintegrating tablet Take 1 tablet (8 mg total) by mouth every 8 (eight)  hours as needed for nausea or vomiting. 20 tablet 0  . SUMAtriptan ([name]) 20 MG/ACT nasal spray 1 spray in nostril.  May repeat x1 in 2 hours if headache persists or recurs. 10 Inhaler 3  . terbinafine (LAMISIL) 250 MG tablet Take 1 tablet (250 mg total) by mouth daily. 90 tablet 0  . venlafaxine XR (EFFEXOR XR) 37.5 MG 24 hr capsule Take 1 capsule (37.5 mg total) by mouth daily with breakfast. After the first week take 2 tabs (total of 75 mg daily) 51 capsule 0   No current facility-administered medications on file prior to visit.     Cardiovascular and other pertinent studies:  EKG 12/27/2018: Sinus rhythm 86 bpm. Normal EKG.  Echocardiogram 2014: - Left ventricle: The cavity size was normal. Wall thickness  was increased in a pattern of mild LVH. The estimated  ejection fraction was 65%. Wall motion was normal; there  were no regional wall motion abnormalities.  - Impressions: No significant valvular abnormalities.   Recent labs: 02/27/2018: Glucose 87 H/H 11/33.8. MCV 76. Platelets 183 HbA1C 4.9% Chol 221, TG 89, HDL 52, LDL 151 TSH 0.3 low, free T4 normal    Review of Systems  Constitution: Negative for decreased appetite, malaise/fatigue, weight gain and weight loss.  HENT: Negative for congestion.   Eyes: Negative for visual disturbance.  Cardiovascular: Positive for chest pain. Negative for dyspnea on exertion, leg swelling, palpitations and syncope.  Respiratory: Negative for cough.   Endocrine: Negative for cold intolerance.  Hematologic/Lymphatic: Does not bruise/bleed easily.  Skin: Negative for itching and rash.  Musculoskeletal: Negative for myalgias.  Gastrointestinal: Negative for abdominal pain, nausea and vomiting.  Genitourinary: Negative for dysuria.  Neurological: Negative for dizziness and weakness.  Psychiatric/Behavioral: The patient is not nervous/anxious.   All other systems reviewed and are negative.        Vitals:   12/27/18 1500   BP: 116/80  Pulse: 88  SpO2: 100%     Body mass index is 47.12 kg/m. Filed Weights   12/27/18 1500  Weight: 266 lb (120.7 kg)     Objective:   Physical Exam  Constitutional: She is oriented to person, place, and time. She appears well-developed and well-nourished. No distress.  Morbidly obese  HENT:  Head: Normocephalic and atraumatic.  Eyes: Pupils are equal, round, and reactive to light. Conjunctivae are normal.  Neck: No JVD present.  Cardiovascular: Normal rate, regular rhythm and intact distal pulses.  Pulmonary/Chest: Effort normal and breath sounds normal. She has no wheezes. She has no rales.  Abdominal: Soft. Bowel sounds are normal. There is no rebound.  Musculoskeletal:  General: No edema.  Lymphadenopathy:    She has no cervical adenopathy.  Neurological: She is alert and oriented to person, place, and time. No cranial nerve deficit.  Skin: Skin is warm and dry.  Psychiatric: She has a normal mood and affect.  Nursing note and vitals reviewed.       Assessment & Recommendations:   38 y.o. African American female with chest pain  Chest pain: Non anginal. I will obtain calcium score for risk stratification. Currently, does not need statin.  .Diet & Lifestyle recommendations:  Physical activity recommendation (The Physical Activity Guidelines for Americans. JAMA 2018;Nov 12) At least 150-300 minutes a week of moderate-intensity, or 75-150 minutes a week of vigorous-intensity aerobic physical activity, or an equivalent combination of moderate- and vigorous-intensity aerobic activity. Adults should perform muscle-strengthening activities on 2 or more days a week. Older adults should do multicomponent physical activity that includes balance training as well as aerobic and muscle-strengthening activities. Benefits of increased physical activity include lower risk of mortality including cardiovascular mortality, lower risk of cardiovascular events and  associated risk factors (hypertension and diabetes), and lower risk of many cancers (including bladder, breast, colon, endometrium, esophagus, kidney, lung, and stomach). Additional improvments have been seen in cognition, risk of dementia, anxiety and depression, improved bone health, lower risk of falls, and associated injuries.  Dietary recommendation The 2019 ACC/AHA guidelines promote nutrition as a main fixture of cardiovascular wellness, with a recommendation for a varied diet of fruit, vegetables, fish, legumes, and whole grains (Class I), as well as recommendations to reduce sodium, cholesterol, processed meats, and refined sugars (Class IIa recommendation).10 Sodium intake, a topic of some controversy as of late, is recommended to be kept at 1,500 mg/day or less, far below the average daily intake in the [place] of 3,409 mg/day, and notably below that of previous [place] recommendations for 300mg /day.10,11 For those unable to reach 1,500 mg/day, they recommend at least a reduction of 1000 mg/day.  A Pes[place]-Mediterranean Diet With Intermittent Fasting: JACC Review Topic of the Week. J Am Coll Cardiol [id number] Pes[place]-Mediterranean diet, it is supplemented with extra-virgin olive oil (EVOO), which is the principle fat source, along with moderate amounts of dairy (particularly yogurt and cheese) and eggs, as well as modest amounts of alcohol consumption (ideally red wine with the evening meal), but few red and processed meats.   Thank you for referring the patient to [place]. Please feel free to contact with any questions.  Addendum: Patient called a few days later stating that her grandfather was tested positive for hATTR anymoidosis, and that all family members were asked to get tested. She wonders if her constipation, tingling, would be associated with amyloidosis. She does not have any specific cardiac complaints that could be associated with amyloidosis. Will test her for hATTR with genetic  testing.   Nigel Mormon, MD [facility]. [facility] Pager: [phone] Office: [phone]

## 2018-12-27 ENCOUNTER — Ambulatory Visit (INDEPENDENT_AMBULATORY_CARE_PROVIDER_SITE_OTHER): Payer: Medicaid Other | Admitting: Cardiology

## 2018-12-27 ENCOUNTER — Encounter: Payer: Self-pay | Admitting: Cardiology

## 2018-12-27 ENCOUNTER — Other Ambulatory Visit: Payer: Self-pay

## 2018-12-27 VITALS — BP 116/80 | HR 88 | Ht 63.0 in | Wt 266.0 lb

## 2018-12-27 DIAGNOSIS — Z136 Encounter for screening for cardiovascular disorders: Secondary | ICD-10-CM | POA: Diagnosis not present

## 2018-12-27 DIAGNOSIS — R0789 Other chest pain: Secondary | ICD-10-CM

## 2019-01-01 ENCOUNTER — Telehealth: Payer: Self-pay

## 2019-01-01 NOTE — Telephone Encounter (Signed)
No we can not do it tomorrow.

## 2019-01-01 NOTE — Telephone Encounter (Signed)
Telephone encounter:  Reason for call: Patient called to  Ask if you can test her for Alnylam act, she said her grandfather has this   Usual provider: MP  Last office visit: 12/27/18  Next office visit: NA   Last hospitalization: NA   Current Outpatient Medications on File Prior to Visit  Medication Sig Dispense Refill  . albuterol (VENTOLIN HFA) 108 (90 Base) MCG/ACT inhaler TAKE 2 PUFFS BY MOUTH 4 TIMES A DAY    . cetirizine (ZYRTEC) 10 MG tablet Take 10 mg by mouth daily.    . clotrimazole (LOTRIMIN) 1 % external solution Apply 1 application topically 2 (two) times daily. In between toes 60 mL 5  . cyclobenzaprine (FLEXERIL) 10 MG tablet Take 10 mg by mouth every 8 (eight) hours as needed for muscle spasms (Migraines).    . famotidine (PEPCID) 20 MG tablet Take 20 mg by mouth 2 (two) times daily.    Marland Kitchen nystatin-triamcinolone ointment (MYCOLOG) Apply 1 application topically 2 (two) times daily. (Patient not taking: Reported on 12/27/2018) 30 g 0  . omeprazole (PRILOSEC) 40 MG capsule Take by mouth daily.    . SUMAtriptan ([name]) 20 MG/ACT nasal spray 1 spray in nostril.  May repeat x1 in 2 hours if headache persists or recurs. 10 Inhaler 3  . terbinafine (LAMISIL) 250 MG tablet Take 1 tablet (250 mg total) by mouth daily. 90 tablet 0   No current facility-administered medications on file prior to visit.

## 2019-01-01 NOTE — Telephone Encounter (Signed)
Spoke with the patient. She will need genetic test for hereditary amyloidosis (hATTR). Would you be able to do this on 12/9 after her echo? Scheduled at 2:30 PM.  Thanks MJP

## 2019-01-02 ENCOUNTER — Ambulatory Visit (INDEPENDENT_AMBULATORY_CARE_PROVIDER_SITE_OTHER): Payer: Medicaid Other

## 2019-01-02 ENCOUNTER — Ambulatory Visit: Payer: Medicaid Other | Admitting: Cardiology

## 2019-01-02 ENCOUNTER — Other Ambulatory Visit: Payer: Self-pay

## 2019-01-02 DIAGNOSIS — Z8349 Family history of other endocrine, nutritional and metabolic diseases: Secondary | ICD-10-CM

## 2019-01-02 DIAGNOSIS — R0789 Other chest pain: Secondary | ICD-10-CM | POA: Diagnosis not present

## 2019-01-02 NOTE — Progress Notes (Signed)
Here for genetic testing only given family history of amyloidosis. Blood was drawn by Damita Lack, MA. Patient tolerated well without any complications.

## 2019-01-04 ENCOUNTER — Encounter: Payer: Self-pay | Admitting: Cardiology

## 2019-01-05 ENCOUNTER — Other Ambulatory Visit: Payer: Self-pay | Admitting: Neurology

## 2019-01-05 DIAGNOSIS — G43719 Chronic migraine without aura, intractable, without status migrainosus: Secondary | ICD-10-CM

## 2019-01-07 ENCOUNTER — Telehealth: Payer: Self-pay

## 2019-01-07 NOTE — Telephone Encounter (Signed)
error 

## 2019-01-07 NOTE — Progress Notes (Signed)
Patient is aware. Cm[place], CMA

## 2019-01-22 ENCOUNTER — Other Ambulatory Visit: Payer: Self-pay | Admitting: Orthopedic Surgery

## 2019-01-22 DIAGNOSIS — M5416 Radiculopathy, lumbar region: Secondary | ICD-10-CM

## 2019-01-29 ENCOUNTER — Encounter: Payer: Self-pay | Admitting: Sports Medicine

## 2019-01-29 ENCOUNTER — Other Ambulatory Visit: Payer: Self-pay

## 2019-01-29 ENCOUNTER — Ambulatory Visit: Payer: Medicaid Other | Admitting: Sports Medicine

## 2019-01-29 DIAGNOSIS — M722 Plantar fascial fibromatosis: Secondary | ICD-10-CM | POA: Diagnosis not present

## 2019-01-29 DIAGNOSIS — M79672 Pain in left foot: Secondary | ICD-10-CM

## 2019-01-29 DIAGNOSIS — B353 Tinea pedis: Secondary | ICD-10-CM

## 2019-01-29 DIAGNOSIS — M79671 Pain in right foot: Secondary | ICD-10-CM

## 2019-01-29 DIAGNOSIS — B351 Tinea unguium: Secondary | ICD-10-CM

## 2019-01-29 MED ORDER — TRIAMCINOLONE ACETONIDE 10 MG/ML IJ SUSP
10.0000 mg | Freq: Once | INTRAMUSCULAR | Status: AC
  Administered 2019-01-29: 10 mg

## 2019-01-29 NOTE — Progress Notes (Signed)
Subjective: Amanda [name] is a 39 y.o. female returns to office for follow up evaluation of heel pain and medication check currently on lamisil for fungus, reports that her skin is doing better but her heels and arches are not, has pain at R>L heel especially 1st few steps in AM; Patient is requesting a shot for right.  Patient denies any recent changes in medications or new problems since last visit.   Patient Active Problem List   Diagnosis Date Noted  . Screening for cardiovascular condition 12/07/2018  . Mixed hyperlipidemia 12/07/2018  . Benign gestational thrombocytopenia ([place]) 11/06/2018  . Status post repeat low transverse cesarean section 02/13/2018  . History of low transverse cesarean section 02/12/2018  . Decreased fetal movement 02/07/2018  . Migraine 09/03/2017  . Maternal age 24+, multigravida, antepartum 07/24/2017  . Maternal obesity affecting pregnancy, antepartum 07/24/2017  . Constipation 07/24/2017  . Sickle cell trait ([place]) 07/24/2017  . 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 Internal derangement of right knee 03/13/2017  . 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 Morning sickness 01/05/2017  . Abdominal pain affecting pregnancy 01/05/2017  . Suprapubic pain 01/05/2017  . Bacterial vaginitis 12/08/2016  . Low back pain 12/16/2015  . H. pylori infection 11/02/2015  . Intractable chronic migraine without aura and without status migrainosus 03/19/2015  . Morbid obesity ([place]) 03/19/2015  . Murmur 06/20/2012  . INSOMNIA 08/14/2009  . DYSMENORRHEA 12/09/2008  . FATIGUE 03/25/2008  . DEPRESSION/ANXIETY 09/11/2007  . HEADACHE 09/11/2007  . PALPITATIONS 02/23/2007  . Atypical chest pain 02/23/2007  . ALLERGIC RHINITIS 10/06/2006  . ASTHMA 10/06/2006  . GERD 10/06/2006    Current Outpatient Medications on File Prior to Visit  Medication Sig Dispense Refill  . albuterol (VENTOLIN HFA) 108 (90 Base) MCG/ACT inhaler TAKE 2 PUFFS BY MOUTH 4 TIMES A DAY    .  cetirizine (ZYRTEC) 10 MG tablet Take 10 mg by mouth daily.    . clotrimazole (LOTRIMIN) 1 % external solution Apply 1 application topically 2 (two) times daily. In between toes 60 mL 5  . cyclobenzaprine (FLEXERIL) 10 MG tablet Take 10 mg by mouth every 8 (eight) hours as needed for muscle spasms (Migraines).    . famotidine (PEPCID) 20 MG tablet Take 20 mg by mouth 2 (two) times daily.    Marland Kitchen nystatin-triamcinolone ointment (MYCOLOG) Apply 1 application topically 2 (two) times daily. (Patient not taking: Reported on 12/27/2018) 30 g 0  . omeprazole (PRILOSEC) 40 MG capsule Take by mouth daily.    . SUMAtriptan ([name]) 20 MG/ACT nasal spray 1 spray in nostril.  May repeat x1 in 2 hours if headache persists or recurs. 10 Inhaler 3  . terbinafine (LAMISIL) 250 MG tablet Take 1 tablet (250 mg total) by mouth daily. 90 tablet 0  . venlafaxine XR (EFFEXOR-XR) 75 MG 24 hr capsule Take 1 capsule (75 mg total) by mouth daily. 90 capsule 3   No current facility-administered medications on file prior to visit.    Allergies  Allergen Reactions  . Aimovig [Erenumab-Aooe]     Hot/lightheaded/nausea and vomiting  . Doxycycline Hyclate Hives, Itching and Swelling  . Ketorolac Tromethamine Other (See Comments)    Patient stated that it makes her extremely hyper  . Amoxicillin Hives, Swelling and Rash    Has patient had a PCN reaction causing immediate rash, facial/tongue/throat swelling, SOB or lightheadedness with hypotension: Unsure Has patient had a PCN reaction causing severe rash involving mucus membranes or skin necrosis: Unsure Has patient had a PCN  reaction that required hospitalization No Has patient had a PCN reaction occurring within the last 10 years: No If all of the above answers are "NO", then may proceed with Cephalosporin use.REACTION: facial swelling and rash.    Objective:   General:  Alert and oriented x 3, in no acute distress  Dermatology: Skin is warm, dry, and supple  bilateral. Nails are within short and thick with decreased scaling plantar surfaces consistent with tinea pedis,improving, there is no lower extremity erythema, no eccymosis, no open lesions present bilateral.   Vascular: Dorsalis Pedis and Posterior Tibial pedal pulses are 1/4 bilateral. + hair growth noted bilateral. Capillary Fill Time is 3 seconds in all digits. No varicosities, No edema bilateral lower extremities.   Neurological: Sensation grossly intact to light touch with an achilles reflex of +2 and a negative Tinel's sign bilateral. Vibratory, sharp/dull, Semmes Weinstein Monofilament within normal limits.   Musculoskeletal: There is mild to moderate tenderness to palpation at the medial calcaneal tubercale and through the insertion of the plantar fascia on the right foot and minimal pain on left. + Pes planus. No pain with compression to calcaneus or application of tuning fork. There is decreased Ankle joint range of motion bilateral. All other jointsrange of motion  within normal limits bilateral. Strength 5/5 bilateral.   Assessment and Plan: Problem List Items Addressed This Visit    None    Visit Diagnoses    Plantar fasciitis    -  Primary   Relevant Medications   triamcinolone acetonide (KENALOG) 10 MG/ML injection 10 mg (Completed) (Start on 01/29/2019 10:00 PM)   Nail fungus       Relevant Orders   Hepatic Function Panel   CBC with Differential   Tinea pedis, unspecified laterality       Left foot pain       Right foot pain          -Complete examination performed.  -Previous x-rays reviewed. -Discussed with patient in detail the condition of plantar fasciitis and tinea, how this occurs related to the foot type of the patient and general treatment options. -After oral consent and aseptic prep, injected a mixture containing 1 ml of 2%  plain lidocaine, 1 ml 0.5% plain marcaine, 0.5 ml of kenalog 10 and 0.5 ml of dexamethasone phosphate into Right heel at plantar fascial  insertion via plantar approach without complication. Post-injection care discussed with patient.  -Applied fascial taping and advised patient if does well may benefit from orthotics -Continue with stretching, icing, good supportive shoes daily  -Discussed long term care and reocurrence; will closely monitor; if fails to improve will consider other treatment modalities.  -Continue with skin cream; Foot miracle  -Continue with PO Lamisil and clotrimazole solution to toes daily -Advised patient to get updated blood work prior to next visit for medication check on Lamisil started 11/08/2018 and to end soon need blood work for review; Reprinted order for patient  -Patient to return to office in 4-6 weeks for follow up or sooner if problems or questions arise.  Landis Martins, DPM

## 2019-02-21 LAB — CBC WITH DIFFERENTIAL/PLATELET
Absolute Monocytes: 323 cells/uL (ref 200–950)
Basophils Absolute: 18 cells/uL (ref 0–200)
Basophils Relative: 0.3 %
Eosinophils Absolute: 159 cells/uL (ref 15–500)
Eosinophils Relative: 2.6 %
HCT: 38.4 % (ref 35.0–45.0)
Hemoglobin: 12.3 g/dL (ref 11.7–15.5)
Lymphs Abs: 946 cells/uL (ref 850–3900)
MCH: 26.3 pg — ABNORMAL LOW (ref 27.0–33.0)
MCHC: 32 g/dL (ref 32.0–36.0)
MCV: 82.2 fL (ref 80.0–100.0)
MPV: 11.3 fL (ref 7.5–12.5)
Monocytes Relative: 5.3 %
Neutro Abs: 4654 cells/uL (ref 1500–7800)
Neutrophils Relative %: 76.3 %
Platelets: 70 10*3/uL — ABNORMAL LOW (ref 140–400)
RBC: 4.67 10*6/uL (ref 3.80–5.10)
RDW: 14.3 % (ref 11.0–15.0)
Total Lymphocyte: 15.5 %
WBC: 6.1 10*3/uL (ref 3.8–10.8)

## 2019-02-21 LAB — HEPATIC FUNCTION PANEL
AG Ratio: 1.6 (calc) (ref 1.0–2.5)
ALT: 9 U/L (ref 6–29)
AST: 13 U/L (ref 10–30)
Albumin: 4 g/dL (ref 3.6–5.1)
Alkaline phosphatase (APISO): 59 U/L (ref 31–125)
Bilirubin, Direct: 0.1 mg/dL (ref 0.0–0.2)
Globulin: 2.5 g/dL (calc) (ref 1.9–3.7)
Indirect Bilirubin: 0.2 mg/dL (calc) (ref 0.2–1.2)
Total Bilirubin: 0.3 mg/dL (ref 0.2–1.2)
Total Protein: 6.5 g/dL (ref 6.1–8.1)

## 2019-03-12 ENCOUNTER — Ambulatory Visit: Payer: Medicaid Other | Admitting: Sports Medicine

## 2019-03-12 ENCOUNTER — Encounter: Payer: Self-pay | Admitting: Sports Medicine

## 2019-03-12 ENCOUNTER — Other Ambulatory Visit: Payer: Self-pay

## 2019-03-12 VITALS — Temp 96.5°F

## 2019-03-12 DIAGNOSIS — B353 Tinea pedis: Secondary | ICD-10-CM

## 2019-03-12 DIAGNOSIS — M79672 Pain in left foot: Secondary | ICD-10-CM | POA: Diagnosis not present

## 2019-03-12 DIAGNOSIS — B351 Tinea unguium: Secondary | ICD-10-CM

## 2019-03-12 DIAGNOSIS — M79671 Pain in right foot: Secondary | ICD-10-CM

## 2019-03-12 DIAGNOSIS — M722 Plantar fascial fibromatosis: Secondary | ICD-10-CM | POA: Diagnosis not present

## 2019-03-12 NOTE — Progress Notes (Signed)
Subjective: Amanda [name] is a 39 y.o. female returns to office for follow up evaluation of heel pain and medication check after completing lamisil for fungus, reports that things are clearing up; Pedicurist trimmed off some of the bad portions of the nails and they look better.  Patient denies any recent changes in medications or new problems since last visit.   Patient Active Problem List   Diagnosis Date Noted  . Screening for cardiovascular condition 12/07/2018  . Mixed hyperlipidemia 12/07/2018  . Benign gestational thrombocytopenia ([place]) 11/06/2018  . Status post repeat low transverse cesarean section 02/13/2018  . History of low transverse cesarean section 02/12/2018  . Decreased fetal movement 02/07/2018  . Migraine 09/03/2017  . Maternal age 38+, multigravida, antepartum 07/24/2017  . Maternal obesity affecting pregnancy, antepartum 07/24/2017  . Constipation 07/24/2017  . Sickle cell trait ([place]) 07/24/2017  . 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 Internal derangement of right knee 03/13/2017  . 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 Morning sickness 01/05/2017  . Abdominal pain affecting pregnancy 01/05/2017  . Suprapubic pain 01/05/2017  . Bacterial vaginitis 12/08/2016  . Low back pain 12/16/2015  . H. pylori infection 11/02/2015  . Intractable chronic migraine without aura and without status migrainosus 03/19/2015  . Morbid obesity ([place]) 03/19/2015  . Murmur 06/20/2012  . INSOMNIA 08/14/2009  . DYSMENORRHEA 12/09/2008  . FATIGUE 03/25/2008  . DEPRESSION/ANXIETY 09/11/2007  . HEADACHE 09/11/2007  . PALPITATIONS 02/23/2007  . Atypical chest pain 02/23/2007  . ALLERGIC RHINITIS 10/06/2006  . ASTHMA 10/06/2006  . GERD 10/06/2006  . Asthma 10/06/2006    Current Outpatient Medications on File Prior to Visit  Medication Sig Dispense Refill  . albuterol (VENTOLIN HFA) 108 (90 Base) MCG/ACT inhaler TAKE 2 PUFFS BY MOUTH 4 TIMES A DAY    . cetirizine (ZYRTEC) 10 MG  tablet Take 10 mg by mouth daily.    . clotrimazole (LOTRIMIN) 1 % external solution Apply 1 application topically 2 (two) times daily. In between toes 60 mL 5  . cyclobenzaprine (FLEXERIL) 5 MG tablet Take 5-10 mg by mouth at bedtime as needed.    . famotidine (PEPCID) 20 MG tablet Take 20 mg by mouth 2 (two) times daily.    . Ferrous Sulfate (IRON PO) Take 1 tablet by mouth daily.    Marland Kitchen gabapentin (NEURONTIN) 300 MG capsule TAKE 1 CAPSULE BY MOUTH AT BEDTIME    . linaclotide (LINZESS) 145 MCG CAPS capsule Take by mouth.    . nystatin-triamcinolone ointment (MYCOLOG) Apply 1 application topically 2 (two) times daily. 30 g 0  . Probiotic Product (PROBIOTIC PO) Take 1 tablet by mouth daily.    . SUMAtriptan ([name]) 20 MG/ACT nasal spray 1 spray in nostril.  May repeat x1 in 2 hours if headache persists or recurs. 10 Inhaler 3  . terbinafine (LAMISIL) 250 MG tablet Take 1 tablet (250 mg total) by mouth daily. 90 tablet 0  . venlafaxine XR (EFFEXOR-XR) 75 MG 24 hr capsule Take 1 capsule (75 mg total) by mouth daily. 90 capsule 3  . VITAMIN D PO Take by mouth. Takes 1 pill a week     No current facility-administered medications on file prior to visit.    Allergies  Allergen Reactions  . Aimovig [Erenumab-Aooe]     Hot/lightheaded/nausea and vomiting  . Doxycycline Hyclate Hives, Itching and Swelling  . Ketorolac Tromethamine Other (See Comments)    Patient stated that it makes her extremely hyper  . Amoxicillin Hives, Swelling and Rash  Has patient had a PCN reaction causing immediate rash, facial/tongue/throat swelling, SOB or lightheadedness with hypotension: Unsure Has patient had a PCN reaction causing severe rash involving mucus membranes or skin necrosis: Unsure Has patient had a PCN reaction that required hospitalization No Has patient had a PCN reaction occurring within the last 10 years: No If all of the above answers are "NO", then may proceed with Cephalosporin use.REACTION:  facial swelling and rash.    Objective:   General:  Alert and oriented x 3, in no acute distress  Dermatology: Skin is warm, dry, and supple bilateral. Nails are within short and thick with decreased scaling plantar surfaces consistent with tinea pedis,improving, there is no lower extremity erythema, no eccymosis, no open lesions present bilateral.   Vascular: Dorsalis Pedis and Posterior Tibial pedal pulses are 1/4 bilateral. + hair growth noted bilateral. Capillary Fill Time is 3 seconds in all digits. No varicosities, No edema bilateral lower extremities.   Neurological: Sensation grossly intact to light touch with an achilles reflex of +2 and a negative Tinel's sign bilateral. Vibratory, sharp/dull, Semmes Weinstein Monofilament within normal limits.   Musculoskeletal: There is minimal tenderness to palpation at the medial calcaneal tubercale and through the insertion of the plantar fascia on the right foot and minimal pain on left. + Pes planus. No pain with compression to calcaneus or application of tuning fork. There is decreased Ankle joint range of motion bilateral. All other joints range of motion  within normal limits bilateral. Strength 5/5 bilateral.   Assessment and Plan: Problem List Items Addressed This Visit    None    Visit Diagnoses    Plantar fasciitis    -  Primary   Left foot pain       Right foot pain       Tinea pedis, unspecified laterality       Nail fungus         -Complete examination performed.  -Re-discussed with patient in detail the condition of plantar fasciitis and tinea, how this occurs related to the foot type of the patient and general treatment options. -No reinjection at this time since pain is much improved -Continue with plantar fascial braces bilateral  -Continue with stretching, icing, good supportive shoes daily  -Lamisil completed  -Continue with good hygiene habits and skin cream as needed  -Patient to return to office as needed or  sooner if problems or questions arise. Advised patient that once she gets a Scientist, product/process development can return to office for custom orthotics.   Landis Martins, DPM

## 2019-03-14 NOTE — Progress Notes (Signed)
Virtual Visit via Video Note The purpose of this virtual visit is to provide medical care while limiting exposure to the novel coronavirus.    Consent was obtained for video visit:  Yes Answered questions that patient had about telehealth interaction:  Yes I discussed the limitations, risks, security and privacy concerns of performing an evaluation and management service by telemedicine. I also discussed with the patient that there may be a patient responsible charge related to this service. The patient expressed understanding and agreed to proceed.  Pt location: Home Physician Location: office Name of referring provider:  Trey Sailors, PA I connected with Amanda [name] at patients initiation/request on 03/15/2019 at  1:30 PM EST by video enabled telemedicine application and verified that I am speaking with the correct person using two identifiers. Pt MRN:  [id number] Pt DOB:  [birth year] Video Participants:  Amanda [name]   History of Present Illness:  Amanda [name] is a 39 year old right-handed woman with asthma, depression, anxiety and morbid obesity who follows up for chronic migraine.   UPDATE: Due to worsening headaches, an MRI of the brain with and without contrast was performed on 11/04/2018 which was personally reviewed and was normal.  She was started on Aimovig in September but she had an adverse reaction (felt hot/lightheaded with nausea and vomiting).  We considered propranolol but decided not to take due to her asthma.  She was prescribed venlafaxine but didn't want to take another pill.  Intensity:  10/10 Duration:  eases the intensity for about 4 hours but does not abort it.   Frequency: daily except for week of cycle. Current headache have been ongoing for 3 days. Flexeril ineffective.  Frequency of abortive therapy:  5-6 days a week. Current NSAIDS:  Ibuprofen 800mg  Current analgesics:  Acetaminophen 500mg  Current triptans:   sumatriptan NS Current ergotamine:  none Current anti-emetic:  Zofran ODT 4mg  Current muscle relaxants:  Flexeril 10mg  Current anti-anxiolytic:  none Current sleep aide:  none Current Antihypertensive medications:  none Current Antidepressant medications:  none Current Anticonvulsant medications:  none Current anti-CGRP:  none Current Vitamins/Herbal/Supplements:  prenatal Current Antihistamines/Decongestants:  none Other therapy:  none Hormone/birth control:  none  Caffeine: no Alcohol: no Smoker: no Diet: Cut out pork, cheese, chocolate. Increased water intake Exercise: walking Depression/stress: stress Sleep hygiene: poor  HISTORY: Onset:  adolescence Location:Holocephalic from neck and shoulders to behind the eyes Quality:Throbbing, pressure Initial intensity:10/10 Aura:no Prodrome:no Associated symptoms:Dizziness, nausea, photophobia, phonophobia Initial Duration:constant Initial Frequency:Daily (16-20 days severe) Triggers:  Emotional stress, getting upset, menstrual cycle Relieving factors:none Activity:Difficult to function when severe  Her headaches improved during her pregnancy but returned post-partum.  Past NSAIDS:  Advil, Aleve Past analgesics:  BC powder Past abortive triptans:  Sumatriptan 100mg , Maxalt, Relpax Past abortive ergotamine:  none Past muscle relaxants:  none Past anti-emetic:  Zofran Past antihypertensive medications:  would not use beta blockers due to underlying asthma. Past antidepressant medications:  Nortriptyline 25mg  (side effects), Zoloft Past anticonvulsant medications:  topiramate 100mg  (ineffective) Past anti-CGRP:  Aimovig 70mg  (helped but it made her feel hot, lightheaded, nauseous with vomiting) Past vitamins/Herbal/Supplements:  none Past antihistamines/decongestants:  none Other past therapies:  Trigger point injections  Family history of headaches:  No  Past Medical History: Past  Medical History:  Diagnosis Date  . Anginal pain ([place])    r/t anxiety  . Anxiety   . Asthma    no inhaler - seasonal  . Constipation   . Depression   .  Dysrhythmia    Palpatations - r/t anxiety  . Genital warts   . GERD (gastroesophageal reflux disease)   . H/O hiatal hernia    surgery to repair  . Headache(784.0)   . Heart murmur   . History of low transverse cesarean section 02/12/2018  . MRSA cellulitis    greater than 10 yrs ago dx at urgent care   . Sickle cell trait ([facility])     Medications: Outpatient Encounter Medications as of 03/15/2019  Medication Sig  . albuterol (VENTOLIN HFA) 108 (90 Base) MCG/ACT inhaler TAKE 2 PUFFS BY MOUTH 4 TIMES A DAY  . cetirizine (ZYRTEC) 10 MG tablet Take 10 mg by mouth daily.  . clotrimazole (LOTRIMIN) 1 % external solution Apply 1 application topically 2 (two) times daily. In between toes  . cyclobenzaprine (FLEXERIL) 5 MG tablet Take 5-10 mg by mouth at bedtime as needed.  . famotidine (PEPCID) 20 MG tablet Take 20 mg by mouth 2 (two) times daily.  . Ferrous Sulfate (IRON PO) Take 1 tablet by mouth daily.  Marland Kitchen gabapentin (NEURONTIN) 300 MG capsule TAKE 1 CAPSULE BY MOUTH AT BEDTIME  . linaclotide (LINZESS) 145 MCG CAPS capsule Take by mouth.  . nystatin-triamcinolone ointment (MYCOLOG) Apply 1 application topically 2 (two) times daily.  . Probiotic Product (PROBIOTIC PO) Take 1 tablet by mouth daily.  . SUMAtriptan ([name]) 20 MG/ACT nasal spray 1 spray in nostril.  May repeat x1 in 2 hours if headache persists or recurs.  . terbinafine (LAMISIL) 250 MG tablet Take 1 tablet (250 mg total) by mouth daily.  Marland Kitchen venlafaxine XR (EFFEXOR-XR) 75 MG 24 hr capsule Take 1 capsule (75 mg total) by mouth daily.  Marland Kitchen VITAMIN D PO Take by mouth. Takes 1 pill a week   No facility-administered encounter medications on file as of 03/15/2019.    Allergies: Allergies  Allergen Reactions  . Aimovig [Erenumab-Aooe]     Hot/lightheaded/nausea and vomiting   . Doxycycline Hyclate Hives, Itching and Swelling  . Ketorolac Tromethamine Other (See Comments)    Patient stated that it makes her extremely hyper  . Amoxicillin Hives, Swelling and Rash    Has patient had a PCN reaction causing immediate rash, facial/tongue/throat swelling, SOB or lightheadedness with hypotension: Unsure Has patient had a PCN reaction causing severe rash involving mucus membranes or skin necrosis: Unsure Has patient had a PCN reaction that required hospitalization No Has patient had a PCN reaction occurring within the last 10 years: No If all of the above answers are "NO", then may proceed with Cephalosporin use.REACTION: facial swelling and rash.    Family History: Family History  Problem Relation Age of Onset  . Hypertension Mother   . Depression Mother   . Depression Father   . Heart disease Maternal Grandmother   . Breast cancer Maternal Aunt        [employer]  . Cancer Maternal Aunt   . Diabetes Maternal Grandfather   . Heart disease Maternal Grandfather   . Hypertension Maternal Grandfather   . Congestive Heart Failure Paternal Grandfather   . Anesthesia problems Neg Hx     Social History: Social History   Socioeconomic History  . Marital status: Single    Spouse name: Not on file  . Number of children: 1  . Years of education: 12+  . Highest education level: Not on file  Occupational History  . Not on file  Tobacco Use  . Smoking status: Never Smoker  . Smokeless tobacco: Never  Used  Substance and Sexual Activity  . Alcohol use: No    Alcohol/week: 0.0 standard drinks  . Drug use: No  . Sexual activity: Yes    Partners: Male    Birth control/protection: Condom  Other Topics Concern  . Not on file  Social History Narrative   Regular exercise-no   Caffeine Use-yes   Social Determinants of Health   Financial Resource Strain:   . Difficulty of Paying Living Expenses: Not on file  Food Insecurity:   . Worried About Charity fundraiser  in the Last Year: Not on file  . Ran Out of Food in the Last Year: Not on file  Transportation Needs:   . Lack of Transportation (Medical): Not on file  . Lack of Transportation (Non-Medical): Not on file  Physical Activity:   . Days of Exercise per Week: Not on file  . Minutes of Exercise per Session: Not on file  Stress:   . Feeling of Stress : Not on file  Social Connections:   . Frequency of Communication with Friends and Family: Not on file  . Frequency of Social Gatherings with Friends and Family: Not on file  . Attends Religious Services: Not on file  . Active Member of Clubs or Organizations: Not on file  . Attends Archivist Meetings: Not on file  . Marital Status: Not on file  Intimate Partner Violence:   . Fear of Current or Ex-Partner: Not on file  . Emotionally Abused: Not on file  . Physically Abused: Not on file  . Sexually Abused: Not on file    Observations/Objective:   Height 5\' 3"  (1.6 m), weight 272 lb (123.4 kg), currently breastfeeding. No acute distress.  Alert and oriented.  Speech fluent and not dysarthric.  Language intact.  Eyes orthophoric on primary gaze.  Face symmetric.  Assessment and Plan:   Migraine without aura, without status migrainosus, not intractable  1.  For preventative management, we will try Emgality 2.  For abortive therapy, she will try Nurtec 3.  Limit use of pain relievers to no more than 2 days out of week to prevent risk of rebound or medication-overuse headache. 4.  Keep headache diary 5.  Exercise, hydration, caffeine cessation, sleep hygiene, monitor for and avoid triggers 6. Follow up 5 months   Follow Up Instructions:    -I discussed the assessment and treatment plan with the patient. The patient was provided an opportunity to ask questions and all were answered. The patient agreed with the plan and demonstrated an understanding of the instructions.   The patient was advised to call back or seek an in-person  evaluation if the symptoms worsen or if the condition fails to improve as anticipated.   Dudley Major, DO

## 2019-03-15 ENCOUNTER — Other Ambulatory Visit: Payer: Self-pay

## 2019-03-15 ENCOUNTER — Telehealth (INDEPENDENT_AMBULATORY_CARE_PROVIDER_SITE_OTHER): Payer: Medicaid Other | Admitting: Neurology

## 2019-03-15 ENCOUNTER — Telehealth: Payer: Self-pay | Admitting: Neurology

## 2019-03-15 ENCOUNTER — Encounter: Payer: Self-pay | Admitting: Neurology

## 2019-03-15 VITALS — Ht 63.0 in | Wt 272.0 lb

## 2019-03-15 DIAGNOSIS — G43719 Chronic migraine without aura, intractable, without status migrainosus: Secondary | ICD-10-CM | POA: Diagnosis not present

## 2019-03-15 MED ORDER — EMGALITY 120 MG/ML ~~LOC~~ SOAJ: 240.0000 mg | Freq: Once | SUBCUTANEOUS | 0 refills | Status: AC

## 2019-03-15 MED ORDER — EMGALITY 120 MG/ML ~~LOC~~ SOAJ: 120.0000 mg | SUBCUTANEOUS | 11 refills | Status: DC

## 2019-03-15 MED ORDER — NURTEC 75 MG PO TBDP: 1.0000 | ORAL_TABLET | Freq: Every day | ORAL | 11 refills | Status: DC | PRN

## 2019-03-15 NOTE — Telephone Encounter (Signed)
Called [facility] [employer] to do verbal PA- 03/15/19- pending PA Ref #: [id number] call back after 24 hours use PA #: [id number]. Awaiting response.

## 2019-03-20 ENCOUNTER — Encounter: Payer: Self-pay | Admitting: *Deleted

## 2019-03-20 NOTE — Progress Notes (Addendum)
Called [place] [phone] option 3 after NPI was entered then option#5 call ref# [id number] PA# [id number] sent for further review contact [place] [facility] for response in 24hours   Called back spoke with [facility] call ref# [id number] Approved 03/20/2019-03/14/2020

## 2019-04-13 ENCOUNTER — Emergency Department (HOSPITAL_COMMUNITY): Payer: Medicaid Other

## 2019-04-13 ENCOUNTER — Emergency Department (HOSPITAL_COMMUNITY)
Admission: EM | Admit: 2019-04-13 | Discharge: 2019-04-13 | Disposition: A | Discharge status: Home / Self Care | Payer: Medicaid Other | Attending: Emergency Medicine | Admitting: Emergency Medicine

## 2019-04-13 ENCOUNTER — Other Ambulatory Visit: Payer: Self-pay

## 2019-04-13 DIAGNOSIS — D573 Sickle-cell trait: Secondary | ICD-10-CM | POA: Diagnosis not present

## 2019-04-13 DIAGNOSIS — M25511 Pain in right shoulder: Secondary | ICD-10-CM | POA: Diagnosis not present

## 2019-04-13 DIAGNOSIS — Z79899 Other long term (current) drug therapy: Secondary | ICD-10-CM | POA: Insufficient documentation

## 2019-04-13 DIAGNOSIS — J45909 Unspecified asthma, uncomplicated: Secondary | ICD-10-CM | POA: Diagnosis not present

## 2019-04-13 MED ORDER — LIDOCAINE 5 % EX PTCH
1.0000 | MEDICATED_PATCH | Freq: Once | CUTANEOUS | Status: DC
  Administered 2019-04-13: 1 via TRANSDERMAL
  Filled 2019-04-13: qty 1

## 2019-04-13 MED ORDER — PREDNISONE 10 MG PO TABS: 20.0000 mg | ORAL_TABLET | Freq: Every day | ORAL | 0 refills | Status: AC

## 2019-04-13 MED ORDER — METHYLPREDNISOLONE SODIUM SUCC 40 MG IJ SOLR
40.0000 mg | Freq: Once | INTRAMUSCULAR | Status: AC
  Administered 2019-04-13: 40 mg via INTRAMUSCULAR
  Filled 2019-04-13: qty 1

## 2019-04-13 MED ORDER — ACETAMINOPHEN 325 MG PO TABS
650.0000 mg | ORAL_TABLET | Freq: Once | ORAL | Status: AC
  Administered 2019-04-13: 650 mg via ORAL
  Filled 2019-04-13: qty 2

## 2019-04-13 NOTE — ED Triage Notes (Signed)
5 day hx of R arm pain with no related injury noted.  Pain progressively worse, now is constant to shoulder extending down to fingers.  Does report numbness and tingling and increased pain with movement.  Pain radiates to mid chest with no SOB or cough.  Has taken Gabapentin, Flexeril, Tylenol without relief.  Alert and oriented, tearful.

## 2019-04-13 NOTE — ED Provider Notes (Signed)
[facility] EMERGENCY DEPARTMENT Provider Note   CSN: [id number] Arrival date & time: 04/13/19  0750     History Chief Complaint  Patient presents with  . Shoulder Pain    Amanda [name] is a 39 y.o. female with past medical history significant for anxiety, sickle cell trait presents to emergency department today with chief complaint of progressively worsening right shoulder pain x4 days.  Patient states the pain started as intermittent but has been present more often than not over the last 2 days.  She describes the pain as sharp, burning and aching sensations that radiate down her right arm.  She states the pain is only present with movement.  She admits to intermittent numbness in her fingers if she keep her arm in the same position for long periods of time. Pain will occasionally radiate to the right side of her chest.  She denies any associated shortness of breath, cough, diaphoresis, back pain. She has tried taking gabapentin and Flexeril at home.  She states the medications need her drowsy and she was able to sleep through the night however when she woke up the pain was still present. She rates pain 6/10 in severity. Denies history of similar pain. Also denies fever, chills, neck pain, rash, wound, swelling of arm, fall/injury/trauma to the arm.   History provided by patient with additional history obtained from chart review.     Past Medical History:  Diagnosis Date  . Anginal pain ([place])    r/t anxiety  . Anxiety   . Asthma    no inhaler - seasonal  . Constipation   . Depression   . Dysrhythmia    Palpatations - r/t anxiety  . Genital warts   . GERD (gastroesophageal reflux disease)   . H/O hiatal hernia    surgery to repair  . Headache(784.0)   . Heart murmur   . History of low transverse cesarean section 02/12/2018  . MRSA cellulitis    greater than 10 yrs ago dx at urgent care   . Sickle cell trait [facility])     Patient Active Problem List     Diagnosis Date Noted  . Screening for cardiovascular condition 12/07/2018  . Mixed hyperlipidemia 12/07/2018  . Benign gestational thrombocytopenia ([place]) 11/06/2018  . Status post repeat low transverse cesarean section 02/13/2018  . History of low transverse cesarean section 02/12/2018  . Decreased fetal movement 02/07/2018  . Migraine 09/03/2017  . Maternal age 19+, multigravida, antepartum 07/24/2017  . Maternal obesity affecting pregnancy, antepartum 07/24/2017  . Constipation 07/24/2017  . Sickle cell trait ([place]) 07/24/2017  . 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 Internal derangement of right knee 03/13/2017  . 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 Morning sickness 01/05/2017  . Abdominal pain affecting pregnancy 01/05/2017  . Suprapubic pain 01/05/2017  . Bacterial vaginitis 12/08/2016  . Low back pain 12/16/2015  . H. pylori infection 11/02/2015  . Intractable chronic migraine without aura and without status migrainosus 03/19/2015  . Morbid obesity ([place]) 03/19/2015  . Murmur 06/20/2012  . INSOMNIA 08/14/2009  . DYSMENORRHEA 12/09/2008  . FATIGUE 03/25/2008  . DEPRESSION/ANXIETY 09/11/2007  . HEADACHE 09/11/2007  . PALPITATIONS 02/23/2007  . Atypical chest pain 02/23/2007  . ALLERGIC RHINITIS 10/06/2006  . ASTHMA 10/06/2006  . GERD 10/06/2006  . Asthma 10/06/2006    Past Surgical History:  Procedure Laterality Date  . CERVICAL CONE BIOPSY    . CESAREAN SECTION  01/20/2012   Procedure: CESAREAN SECTION;  Surgeon: Lucille Passy  Ulanda Edison, MD;  Location: [facility];  Service: Obstetrics;  Laterality: N/A;  . CESAREAN SECTION WITH BILATERAL TUBAL LIGATION N/A 02/13/2018   Procedure: CESAREAN SECTION WITH BILATERAL TUBAL LIGATION;  Surgeon: Janyth Contes, MD;  Location: [facility];  Service: Obstetrics;  Laterality: N/A;  [name], RNFA  Needs [name]  . COLONOSCOPY    . DILATION AND EVACUATION N/A 03/30/2017   Procedure: DILATATION AND EVACUATION WITH ULTRASOUND GUIDANCE;   Surgeon: Janyth Contes, MD;  Location: [facility];  Service: Gynecology;  Laterality: N/A;  . HERNIA REPAIR    . INSERTION OF MESH N/A 05/15/2015   Procedure: INSERTION OF MESH;  Surgeon: Ralene Ok, MD;  Location: [facility];  Service: General;  Laterality: N/A;  . MARSUPIALIZATION URETHRAL DIVERTICULUM    . UMBILICAL HERNIA REPAIR N/A 05/15/2015   Procedure: LAPAROSCOPIC UMBILICAL HERNIA;  Surgeon: Ralene Ok, MD;  Location: [facility];  Service: General;  Laterality: N/A;  . UPPER GI ENDOSCOPY    . WISDOM TOOTH EXTRACTION       OB History    Gravida  5   Para  2   Term  2   Preterm  0   AB  3   Living  2     SAB  2   TAB  1   Ectopic  0   Multiple  0   Live Births  2           Family History  Problem Relation Age of Onset  . Hypertension Mother   . Depression Mother   . Depression Father   . Heart disease Maternal Grandmother   . Breast cancer Maternal Aunt        [employer]  . Cancer Maternal Aunt   . Diabetes Maternal Grandfather   . Heart disease Maternal Grandfather   . Hypertension Maternal Grandfather   . Congestive Heart Failure Paternal Grandfather   . Anesthesia problems Neg Hx     Social History   Tobacco Use  . Smoking status: Never Smoker  . Smokeless tobacco: Never Used  Substance Use Topics  . Alcohol use: No    Alcohol/week: 0.0 standard drinks  . Drug use: No    Home Medications Prior to Admission medications   Medication Sig Start Date End Date Taking? Authorizing Provider  albuterol (VENTOLIN HFA) 108 (90 Base) MCG/ACT inhaler TAKE 2 PUFFS BY MOUTH 4 TIMES A DAY 10/18/18   [provider]  cetirizine (ZYRTEC) 10 MG tablet Take 10 mg by mouth daily. 10/18/18   [provider]  clotrimazole (LOTRIMIN) 1 % external solution Apply 1 application topically 2 (two) times daily. In between toes 11/06/18   Landis Martins, DPM  cyclobenzaprine (FLEXERIL) 5 MG tablet Take 5-10 mg by mouth at bedtime as needed.  12/28/18   [provider]  famotidine (PEPCID) 20 MG tablet Take 20 mg by mouth 2 (two) times daily. 10/18/18   [provider]  Ferrous Sulfate (IRON PO) Take 1 tablet by mouth daily.    [provider]  gabapentin (NEURONTIN) 300 MG capsule TAKE 1 CAPSULE BY MOUTH AT BEDTIME 12/28/18   [provider]  Galcanezumab-gnlm (EMGALITY) 120 MG/ML SOAJ Inject 120 mg into the skin every 28 (twenty-eight) days. 03/15/19   Pieter Partridge, DO  linaclotide Rolan Lipa) 145 MCG CAPS capsule Take by mouth. 02/18/19   [provider]  nystatin-triamcinolone ointment (MYCOLOG) Apply 1 application topically 2 (two) times daily. 11/06/18   Landis Martins, DPM  predniSONE (DELTASONE)  10 MG tablet Take 2 tablets (20 mg total) by mouth daily for 5 days. 04/14/19 04/19/19  Alecia Doi E, PA-C  Probiotic Product (PROBIOTIC PO) Take 1 tablet by mouth daily.    [provider]  Rimegepant Sulfate (NURTEC) 75 MG TBDP Take 1 tablet by mouth daily as needed (Maximum 1 tablet in 24 hours). 03/15/19   Pieter Partridge, DO  SUMAtriptan ([name]) 20 MG/ACT nasal spray 1 spray in nostril.  May repeat x1 in 2 hours if headache persists or recurs. 10/19/18   Pieter Partridge, DO  terbinafine (LAMISIL) 250 MG tablet Take 1 tablet (250 mg total) by mouth daily. 11/08/18   Landis Martins, DPM  venlafaxine XR (EFFEXOR-XR) 75 MG 24 hr capsule Take 1 capsule (75 mg total) by mouth daily. Patient not taking: Reported on 03/15/2019 01/07/19   Pieter Partridge, DO  VITAMIN D PO Take by mouth. Takes 1 pill a week    [provider]    Allergies    Aimovig [erenumab-aooe], Doxycycline hyclate, Ketorolac tromethamine, and Amoxicillin  Review of Systems   Review of Systems All other systems are reviewed and are negative for acute change except as noted in the HPI.  Physical Exam Updated Vital Signs BP (!) 140/93 (BP Location: Left Arm)   Pulse 87   Temp 98.7 F (37.1 C) (Oral)   Resp  18   Ht 5\' 3"  (1.6 m)   Wt 117.9 kg   SpO2 100%   BMI 46.06 kg/m   Physical Exam Vitals and nursing note reviewed.  Constitutional:      Appearance: She is well-developed. She is not ill-appearing or toxic-appearing.  HENT:     Head: Normocephalic and atraumatic.     Nose: Nose normal.  Eyes:     General: No scleral icterus.       Right eye: No discharge.        Left eye: No discharge.     Conjunctiva/sclera: Conjunctivae normal.  Neck:     Vascular: No JVD.     Comments: Full ROM intact without spinous process TTP. No bony stepoffs or deformities, no paraspinous muscle TTP or muscle spasms. No rigidity or meningeal signs. No bruising, erythema, or swelling.  Cardiovascular:     Rate and Rhythm: Normal rate and regular rhythm.     Pulses: Normal pulses.          Radial pulses are 2+ on the right side and 2+ on the left side.     Heart sounds: Normal heart sounds.  Pulmonary:     Effort: Pulmonary effort is normal.     Breath sounds: Normal breath sounds.  Abdominal:     General: There is no distension.  Musculoskeletal:     Right shoulder: Bony tenderness present. No swelling, deformity, effusion, laceration, tenderness or crepitus. Decreased range of motion. Normal strength.     Right upper arm: Normal.     Right elbow: Normal.     Right forearm: Normal.     Right wrist: Normal.     Right hand: Normal.     Cervical back: Normal range of motion.     Comments: All extremities have same tactile temperature.   Right upper extremity is neurovascularly intact distally. Compartments soft above and below affected joint.   Full range of motion of thoracic and lumbar spine.  No midline spinous process tenderness, no crepitus, deformity or step-off.   Skin:    General: Skin is warm and dry.  Capillary Refill: Capillary refill takes less than 2 seconds.  Neurological:     Mental Status: She is oriented to person, place, and time.     GCS: GCS eye subscore is 4. GCS verbal  subscore is 5. GCS motor subscore is 6.     Comments: Fluent speech, no facial droop.  Psychiatric:        Mood and Affect: Affect is tearful.        Behavior: Behavior normal.     ED Results / Procedures / Treatments   Labs (all labs ordered are listed, but only abnormal results are displayed) Labs Reviewed - No data to display  EKG None  Radiology DG Shoulder Right  Result Date: 04/13/2019 CLINICAL DATA:  Pain without injury. EXAM: RIGHT SHOULDER - 2+ VIEW COMPARISON:  None. FINDINGS: There is no evidence of fracture or dislocation. There is no evidence of arthropathy or other focal bone abnormality. Soft tissues are unremarkable. IMPRESSION: Negative. Electronically Signed   By: Dorise Bullion III M.D   On: 04/13/2019 08:44    Procedures Procedures (including critical care time)  Medications Ordered in ED Medications  lidocaine (LIDODERM) 5 % 1 patch (1 patch Transdermal Patch Applied 04/13/19 0848)  methylPREDNISolone sodium succinate (SOLU-MEDROL) 40 mg/mL injection 40 mg (40 mg Intramuscular Given 04/13/19 0847)  acetaminophen (TYLENOL) tablet 650 mg (650 mg Oral Given 04/13/19 0847)    ED Course  I have reviewed the triage vital signs and the nursing notes.  Pertinent labs & imaging results that were available during my care of the patient were reviewed by me and considered in my medical decision making (see chart for details).    MDM Rules/Calculators/A&P                      Patient presents to the ED with complaints of pain to the right shoulder without injury. Exam without obvious deformity or open wounds. ROM decreased secondary to pain on my initial exam. Bony tenderness to palpation . NVI distally. Xray viewed by me is negative for fracture/dislocation.  Lidocaine patch, Tylenol, IM Solu-Medrol given.  On reassessment patient is resting comfortably.  She has full range of motion of her right shoulder.  Suspect cervical radiculopathy as source of pain.  Patient did  mention that chest pain occasionally radiates to the right side of her chest however her hesitation and exam today are not consistent with ACS.  Will discharge with short course of steroids and recommend orthopedics follow-up.  Patient is already established patient at [facility]. I discussed results, treatment plan, need for follow-up, and return precautions with the patient. Provided opportunity for questions, patient confirmed understanding and are in agreement with plan.    Portions of this note were generated with [employer]. Dictation errors may occur despite best attempts at proofreading.   Final Clinical Impression(s) / ED Diagnoses Final diagnoses:  Acute pain of right shoulder    Rx / DC Orders ED Discharge Orders         Ordered    predniSONE (DELTASONE) 10 MG tablet  Daily     04/13/19 0929           Cherre Robins, PA-C 04/13/19 [id number]    Wyvonnia Dusky, MD 04/13/19 1016

## 2019-04-13 NOTE — Discharge Instructions (Signed)
Be sure to read and understand instructions below prior to leaving the hospital. If your symptoms persist without any improvement in 1 week it is reccommended that you follow up with orthopedics.   -Prescription sent to your pharmacy for prednisone.  This is a steroid used to help inflammation.  Please start taking tomorrow.  You already received a dose of steroids here in the emergency department.  Common mechanisms of injury include:   Direct hit (trauma) to the shoulder.  Aging, erosion of the tendon with normal use.  Bony bump on shoulder (acromial spur).  Stress from sudden increase in duration, frequency, or intensity of training  RISK INCREASES WITH:  Contact sports (football, wrestling, boxing).  Throwing sports (baseball, tennis, volleyball).  Weightlifting and bodybuilding.  Heavy labor.  Previous injury to the rotator cuff, including impingement.  Poor shoulder strength and flexibility.  Failure to warm up properly before activity.  Inadequate protective equipment.  Old age.  Bony bump on shoulder (acromial spur).   RELATED COMPLICATIONS  Shoulder stiffness, frozen shoulder, or loss of motion.  Rotator cuff tendon tear.  Recurring symptoms, especially if activity is resumed too soon, with overuse, with a direct blow, or when using poor technique.   TREATMENT  Treatment first involves the use of ice and medicine, to reduce pain and inflammation. The use of strengthening and stretching exercises may help reduce pain with activity. These exercises may be performed at home or with a therapist.  If non-surgical treatment is unsuccessful after more than 6 months, surgery may be advised. MEDICATION  If pain medicine is needed, nonsteroidal anti-inflammatory medicines (Motrin and ibuprofen), or other minor pain relievers (acetaminophen) Prescription pain relievers may be given, if your caregiver thinks they are needed. Use only as directed and only as much as you need  ACTIVITY      - It is reccommended to perform range of motion activity as tolerated to prevent shoulder stiffness.  HEAT AND COLD  Cold treatment (icing) should be applied for 10 to 15 minutes every 2 to 3 hours for inflammation and pain, and immediately after activity that aggravates your symptoms. Use ice packs or an ice massage.  Heat treatment may be used before performing stretching and strengthening activities prescribed by your caregiver, physical therapist, or athletic trainer. Use a heat pack or a warm water soak.  SEEK IMMEDIATE MEDICAL CARE IF:  Your arm, hand, or fingers are numb or tingling.  Your arm, hand, or fingers are swollen, painful, or turn white or blue.  You develop chest pain or shortness of breath.

## 2019-04-15 ENCOUNTER — Emergency Department (HOSPITAL_COMMUNITY)
Admission: EM | Admit: 2019-04-15 | Discharge: 2019-04-15 | Disposition: A | Discharge status: Home / Self Care | Payer: Medicaid Other | Attending: Emergency Medicine | Admitting: Emergency Medicine

## 2019-04-15 ENCOUNTER — Encounter (HOSPITAL_COMMUNITY): Payer: Self-pay | Admitting: Emergency Medicine

## 2019-04-15 DIAGNOSIS — Z5321 Procedure and treatment not carried out due to patient leaving prior to being seen by health care provider: Secondary | ICD-10-CM | POA: Insufficient documentation

## 2019-04-15 DIAGNOSIS — R112 Nausea with vomiting, unspecified: Secondary | ICD-10-CM | POA: Insufficient documentation

## 2019-04-15 DIAGNOSIS — R002 Palpitations: Secondary | ICD-10-CM | POA: Diagnosis not present

## 2019-04-15 DIAGNOSIS — R197 Diarrhea, unspecified: Secondary | ICD-10-CM | POA: Insufficient documentation

## 2019-04-15 LAB — COMPREHENSIVE METABOLIC PANEL
ALT: 19 U/L (ref 0–44)
AST: 18 U/L (ref 15–41)
Albumin: 4.1 g/dL (ref 3.5–5.0)
Alkaline Phosphatase: 65 U/L (ref 38–126)
Anion gap: 13 (ref 5–15)
BUN: 15 mg/dL (ref 6–20)
CO2: 20 mmol/L — ABNORMAL LOW (ref 22–32)
Calcium: 9.4 mg/dL (ref 8.9–10.3)
Chloride: 107 mmol/L (ref 98–111)
Creatinine, Ser: 0.76 mg/dL (ref 0.44–1.00)
GFR calc Af Amer: >60 mL/min (ref >60)
GFR calc non Af Amer: >60 mL/min (ref >60)
Glucose, Bld: 95 mg/dL (ref 70–99)
Potassium: 4.1 mmol/L (ref 3.5–5.1)
Sodium: 140 mmol/L (ref 135–145)
Total Bilirubin: 0.4 mg/dL (ref 0.3–1.2)
Total Protein: 8 g/dL (ref 6.5–8.1)

## 2019-04-15 LAB — CBC
HCT: 43.1 % (ref 36.0–46.0)
Hemoglobin: 13.6 g/dL (ref 12.0–15.0)
MCH: 26.3 pg (ref 26.0–34.0)
MCHC: 31.6 g/dL (ref 30.0–36.0)
MCV: 83.4 fL (ref 80.0–100.0)
Platelets: 173 10*3/uL (ref 150–400)
RBC: 5.17 MIL/uL — ABNORMAL HIGH (ref 3.87–5.11)
RDW: 15.1 % (ref 11.5–15.5)
WBC: 18.5 10*3/uL — ABNORMAL HIGH (ref 4.0–10.5)
nRBC: 0 % (ref 0.0–0.2)

## 2019-04-15 LAB — LIPASE, BLOOD: Lipase: 24 U/L (ref 11–51)

## 2019-04-15 MED ORDER — SODIUM CHLORIDE 0.9% FLUSH: 3.0000 mL | Freq: Once | INTRAVENOUS | Status: DC

## 2019-04-15 NOTE — ED Triage Notes (Signed)
Was seen on 3/20  For arm pain and was  Given rx for pred. States took 2 last night and has had n/v/d since she states  paplipations

## 2019-04-17 ENCOUNTER — Emergency Department (HOSPITAL_COMMUNITY)
Admission: EM | Admit: 2019-04-17 | Discharge: 2019-04-17 | Disposition: A | Discharge status: Home / Self Care | Payer: Medicaid Other | Attending: Emergency Medicine | Admitting: Emergency Medicine

## 2019-04-17 ENCOUNTER — Other Ambulatory Visit: Payer: Self-pay

## 2019-04-17 DIAGNOSIS — J45909 Unspecified asthma, uncomplicated: Secondary | ICD-10-CM | POA: Insufficient documentation

## 2019-04-17 DIAGNOSIS — R112 Nausea with vomiting, unspecified: Secondary | ICD-10-CM

## 2019-04-17 DIAGNOSIS — Z79899 Other long term (current) drug therapy: Secondary | ICD-10-CM | POA: Diagnosis not present

## 2019-04-17 DIAGNOSIS — R109 Unspecified abdominal pain: Secondary | ICD-10-CM | POA: Diagnosis not present

## 2019-04-17 DIAGNOSIS — R079 Chest pain, unspecified: Secondary | ICD-10-CM | POA: Diagnosis present

## 2019-04-17 DIAGNOSIS — R197 Diarrhea, unspecified: Secondary | ICD-10-CM | POA: Diagnosis not present

## 2019-04-17 LAB — COMPREHENSIVE METABOLIC PANEL
ALT: 18 U/L (ref 0–44)
AST: 17 U/L (ref 15–41)
Albumin: 3.6 g/dL (ref 3.5–5.0)
Alkaline Phosphatase: 57 U/L (ref 38–126)
Anion gap: 8 (ref 5–15)
BUN: 5 mg/dL — ABNORMAL LOW (ref 6–20)
CO2: 25 mmol/L (ref 22–32)
Calcium: 8.7 mg/dL — ABNORMAL LOW (ref 8.9–10.3)
Chloride: 105 mmol/L (ref 98–111)
Creatinine, Ser: 0.65 mg/dL (ref 0.44–1.00)
GFR calc Af Amer: >60 mL/min (ref >60)
GFR calc non Af Amer: >60 mL/min (ref >60)
Glucose, Bld: 122 mg/dL — ABNORMAL HIGH (ref 70–99)
Potassium: 3.6 mmol/L (ref 3.5–5.1)
Sodium: 138 mmol/L (ref 135–145)
Total Bilirubin: 0.7 mg/dL (ref 0.3–1.2)
Total Protein: 7.2 g/dL (ref 6.5–8.1)

## 2019-04-17 LAB — LIPASE, BLOOD: Lipase: 29 U/L (ref 11–51)

## 2019-04-17 LAB — TROPONIN I (HIGH SENSITIVITY)
Troponin I (High Sensitivity): 3 ng/L (ref <18)
Troponin I (High Sensitivity): 4 ng/L (ref <18)

## 2019-04-17 LAB — I-STAT BETA HCG BLOOD, ED (MC, WL, AP ONLY): I-stat hCG, quantitative: <5 m[IU]/mL (ref <5)

## 2019-04-17 LAB — CBC
HCT: 38.7 % (ref 36.0–46.0)
Hemoglobin: 12.5 g/dL (ref 12.0–15.0)
MCH: 26 pg (ref 26.0–34.0)
MCHC: 32.3 g/dL (ref 30.0–36.0)
MCV: 80.6 fL (ref 80.0–100.0)
Platelets: 193 10*3/uL (ref 150–400)
RBC: 4.8 MIL/uL (ref 3.87–5.11)
RDW: 14.7 % (ref 11.5–15.5)
WBC: 8.4 10*3/uL (ref 4.0–10.5)
nRBC: 0 % (ref 0.0–0.2)

## 2019-04-17 MED ORDER — ALUM & MAG HYDROXIDE-SIMETH 200-200-20 MG/5ML PO SUSP
15.0000 mL | Freq: Once | ORAL | Status: AC
  Administered 2019-04-17: 15 mL via ORAL
  Filled 2019-04-17: qty 30

## 2019-04-17 MED ORDER — SODIUM CHLORIDE 0.9% FLUSH
3.0000 mL | Freq: Once | INTRAVENOUS | Status: AC
  Administered 2019-04-17: 3 mL via INTRAVENOUS

## 2019-04-17 MED ORDER — ONDANSETRON HCL 4 MG/2ML IJ SOLN
4.0000 mg | Freq: Once | INTRAMUSCULAR | Status: AC
  Administered 2019-04-17: 4 mg via INTRAVENOUS
  Filled 2019-04-17: qty 2

## 2019-04-17 MED ORDER — LACTATED RINGERS IV BOLUS
1000.0000 mL | Freq: Once | INTRAVENOUS | Status: AC
  Administered 2019-04-17: 1000 mL via INTRAVENOUS

## 2019-04-17 MED ORDER — ONDANSETRON 4 MG PO TBDP: 4.0000 mg | ORAL_TABLET | Freq: Three times a day (TID) | ORAL | 0 refills | Status: DC | PRN

## 2019-04-17 NOTE — Discharge Instructions (Signed)
Take Zofran as needed for nausea Continue Pepcid and Omeprazole for acid reflux Please drink plenty of fluids Return if you are worsening

## 2019-04-17 NOTE — ED Notes (Signed)
Patient had a successful PO challenge with water

## 2019-04-17 NOTE — ED Notes (Signed)
Patient left the ER prior to receiving papers stating "I can see everything on MyChart." The patient was told by RN Wilmer Floor the papers may be printed, but she left without them.

## 2019-04-17 NOTE — ED Triage Notes (Signed)
Pt in POV, reports she was seen 3/22 for N/V/D, palpitations but LWBS d/t wait time. Reports her chest is sore/pain with inspiration secondary to throwing up. Pt states she is dehydrated.

## 2019-04-17 NOTE — ED Notes (Signed)
Attempted to get a urine sample from the pt, pt states that she forgot to provide a sample when she used the bathroom.

## 2019-04-17 NOTE — ED Provider Notes (Signed)
[facility] EMERGENCY DEPARTMENT Provider Note   CSN: [id number] Arrival date & time: 04/17/19  1648     History Chief Complaint  Patient presents with  . Chest Pain    Amanda [name] is a 39 y.o. female who presents with multiple complaints.  The patient states that she was here for shoulder pain on March 20 and given a prescription for steroids.  She took the steroids and started to feel sick.  She started to have nausea, vomiting, and diarrhea.  She came back to the ED 2 days later but left without being seen due to long wait times.  Yesterday the vomiting and diarrhea started to improve however she was having a lot of chest pain which she is attributing to vomiting and reflux.  She is also having a lot of gas and is burping and passing a lot of gas and she has stomach discomfort.  She tried Pepcid and omeprazole without significant relief.  She feels dehydrated and has not urinated.  She came back to the ED today because she is just not feeling better and is having panic attacks  HPI     Past Medical History:  Diagnosis Date  . Anginal pain ([place])    r/t anxiety  . Anxiety   . Asthma    no inhaler - seasonal  . Constipation   . Depression   . Dysrhythmia    Palpatations - r/t anxiety  . Genital warts   . GERD (gastroesophageal reflux disease)   . H/O hiatal hernia    surgery to repair  . Headache(784.0)   . Heart murmur   . History of low transverse cesarean section 02/12/2018  . MRSA cellulitis    greater than 10 yrs ago dx at urgent care   . Sickle cell trait [facility])     Patient Active Problem List   Diagnosis Date Noted  . Screening for cardiovascular condition 12/07/2018  . Mixed hyperlipidemia 12/07/2018  . Benign gestational thrombocytopenia ([place]) 11/06/2018  . Status post repeat low transverse cesarean section 02/13/2018  . History of low transverse cesarean section 02/12/2018  . Decreased fetal movement 02/07/2018  . Migraine  09/03/2017  . Maternal age 17+, multigravida, antepartum 07/24/2017  . Maternal obesity affecting pregnancy, antepartum 07/24/2017  . Constipation 07/24/2017  . Sickle cell trait ([place]) 07/24/2017  . 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 Internal derangement of right knee 03/13/2017  . 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 Morning sickness 01/05/2017  . Abdominal pain affecting pregnancy 01/05/2017  . Suprapubic pain 01/05/2017  . Bacterial vaginitis 12/08/2016  . Low back pain 12/16/2015  . H. pylori infection 11/02/2015  . Intractable chronic migraine without aura and without status migrainosus 03/19/2015  . Morbid obesity ([place]) 03/19/2015  . Murmur 06/20/2012  . INSOMNIA 08/14/2009  . DYSMENORRHEA 12/09/2008  . FATIGUE 03/25/2008  . DEPRESSION/ANXIETY 09/11/2007  . HEADACHE 09/11/2007  . PALPITATIONS 02/23/2007  . Atypical chest pain 02/23/2007  . ALLERGIC RHINITIS 10/06/2006  . ASTHMA 10/06/2006  . GERD 10/06/2006  . Asthma 10/06/2006    Past Surgical History:  Procedure Laterality Date  . CERVICAL CONE BIOPSY    . CESAREAN SECTION  01/20/2012   Procedure: CESAREAN SECTION;  Surgeon: Melina Schools, MD;  Location: [facility];  Service: Obstetrics;  Laterality: N/A;  . CESAREAN SECTION WITH BILATERAL TUBAL LIGATION N/A 02/13/2018   Procedure: CESAREAN SECTION WITH BILATERAL TUBAL LIGATION;  Surgeon: Janyth Contes, MD;  Location: [facility];  Service: Obstetrics;  Laterality: N/A;  [name], RNFA  Needs [name]  . COLONOSCOPY    . DILATION AND EVACUATION N/A 03/30/2017   Procedure: DILATATION AND EVACUATION WITH ULTRASOUND GUIDANCE;  Surgeon: Janyth Contes, MD;  Location: [facility];  Service: Gynecology;  Laterality: N/A;  . HERNIA REPAIR    . INSERTION OF MESH N/A 05/15/2015   Procedure: INSERTION OF MESH;  Surgeon: Ralene Ok, MD;  Location: [facility];  Service: General;  Laterality: N/A;  . MARSUPIALIZATION URETHRAL DIVERTICULUM    . UMBILICAL HERNIA REPAIR N/A  05/15/2015   Procedure: LAPAROSCOPIC UMBILICAL HERNIA;  Surgeon: Ralene Ok, MD;  Location: [facility];  Service: General;  Laterality: N/A;  . UPPER GI ENDOSCOPY    . WISDOM TOOTH EXTRACTION       OB History    Gravida  5   Para  2   Term  2   Preterm  0   AB  3   Living  2     SAB  2   TAB  1   Ectopic  0   Multiple  0   Live Births  2           Family History  Problem Relation Age of Onset  . Hypertension Mother   . Depression Mother   . Depression Father   . Heart disease Maternal Grandmother   . Breast cancer Maternal Aunt        [employer]  . Cancer Maternal Aunt   . Diabetes Maternal Grandfather   . Heart disease Maternal Grandfather   . Hypertension Maternal Grandfather   . Congestive Heart Failure Paternal Grandfather   . Anesthesia problems Neg Hx     Social History   Tobacco Use  . Smoking status: Never Smoker  . Smokeless tobacco: Never Used  Substance Use Topics  . Alcohol use: No    Alcohol/week: 0.0 standard drinks  . Drug use: No    Home Medications Prior to Admission medications   Medication Sig Start Date End Date Taking? Authorizing Provider  albuterol (VENTOLIN HFA) 108 (90 Base) MCG/ACT inhaler TAKE 2 PUFFS BY MOUTH 4 TIMES A DAY 10/18/18   [provider]  cetirizine (ZYRTEC) 10 MG tablet Take 10 mg by mouth daily. 10/18/18   [provider]  clotrimazole (LOTRIMIN) 1 % external solution Apply 1 application topically 2 (two) times daily. In between toes 11/06/18   Landis Martins, DPM  cyclobenzaprine (FLEXERIL) 5 MG tablet Take 5-10 mg by mouth at bedtime as needed. 12/28/18   [provider]  famotidine (PEPCID) 20 MG tablet Take 20 mg by mouth 2 (two) times daily. 10/18/18   [provider]  Ferrous Sulfate (IRON PO) Take 1 tablet by mouth daily.    [provider]  gabapentin (NEURONTIN) 300 MG capsule TAKE 1 CAPSULE BY MOUTH AT BEDTIME 12/28/18   [provider]    Galcanezumab-gnlm (EMGALITY) 120 MG/ML SOAJ Inject 120 mg into the skin every 28 (twenty-eight) days. 03/15/19   Pieter Partridge, DO  linaclotide Rolan Lipa) 145 MCG CAPS capsule Take by mouth. 02/18/19   [provider]  nystatin-triamcinolone ointment (MYCOLOG) Apply 1 application topically 2 (two) times daily. 11/06/18   Landis Martins, DPM  predniSONE (DELTASONE) 10 MG tablet Take 2 tablets (20 mg total) by mouth daily for 5 days. 04/14/19 04/19/19  Albrizze, Kaitlyn E, PA-C  Probiotic Product (PROBIOTIC PO) Take 1 tablet by mouth daily.    [provider]  Rimegepant Sulfate (NURTEC) 75  MG TBDP Take 1 tablet by mouth daily as needed (Maximum 1 tablet in 24 hours). 03/15/19   Pieter Partridge, DO  SUMAtriptan ([name]) 20 MG/ACT nasal spray 1 spray in nostril.  May repeat x1 in 2 hours if headache persists or recurs. 10/19/18   Pieter Partridge, DO  terbinafine (LAMISIL) 250 MG tablet Take 1 tablet (250 mg total) by mouth daily. 11/08/18   Landis Martins, DPM  venlafaxine XR (EFFEXOR-XR) 75 MG 24 hr capsule Take 1 capsule (75 mg total) by mouth daily. Patient not taking: Reported on 03/15/2019 01/07/19   Pieter Partridge, DO  VITAMIN D PO Take by mouth. Takes 1 pill a week    [provider]    Allergies    Aimovig [erenumab-aooe], Doxycycline hyclate, Ketorolac tromethamine, and Amoxicillin  Review of Systems   Review of Systems  Constitutional: Positive for fatigue.  Respiratory: Negative for shortness of breath.   Cardiovascular: Positive for chest pain.  Gastrointestinal: Positive for abdominal pain, diarrhea (resolved), nausea and vomiting (resolved).  Genitourinary: Positive for decreased urine volume. Negative for dysuria.  All other systems reviewed and are negative.   Physical Exam Updated Vital Signs BP (!) 149/91 (BP Location: Left Wrist)   Pulse 83   Temp 98.8 F (37.1 C) (Oral)   Resp 20   Ht 5\' 3"  (1.6 m)   Wt 117.9 kg   SpO2 99%   BMI 46.06 kg/m    Physical Exam Vitals and nursing note reviewed.  Constitutional:      General: She is not in acute distress.    Appearance: She is well-developed. She is obese. She is not ill-appearing.     Comments: Cooperative. Tearful. Mildly anxious  HENT:     Head: Normocephalic and atraumatic.  Eyes:     General: No scleral icterus.       Right eye: No discharge.        Left eye: No discharge.     Conjunctiva/sclera: Conjunctivae normal.     Pupils: Pupils are equal, round, and reactive to light.  Cardiovascular:     Rate and Rhythm: Normal rate and regular rhythm.  Pulmonary:     Effort: Pulmonary effort is normal. No respiratory distress.     Breath sounds: Normal breath sounds.  Abdominal:     General: There is no distension.     Palpations: Abdomen is soft.     Tenderness: There is abdominal tenderness (diffuse upper abdominal tenderness).  Musculoskeletal:     Cervical back: Normal range of motion.  Skin:    General: Skin is warm and dry.  Neurological:     Mental Status: She is alert and oriented to person, place, and time.  Psychiatric:        Behavior: Behavior normal.     ED Results / Procedures / Treatments   Labs (all labs ordered are listed, but only abnormal results are displayed) Labs Reviewed  COMPREHENSIVE METABOLIC PANEL - Abnormal; Notable for the following components:      Result Value   Glucose, Bld 122 (*)    BUN 5 (*)    Calcium 8.7 (*)    All other components within normal limits  LIPASE, BLOOD  CBC  URINALYSIS, ROUTINE W REFLEX MICROSCOPIC  I-STAT BETA HCG BLOOD, ED ([facility], [facility], [facility] ONLY)  TROPONIN I (HIGH SENSITIVITY)  TROPONIN I (HIGH SENSITIVITY)    EKG None  Radiology No results found.  Procedures Procedures (including critical care time)  Medications Ordered in  ED Medications  sodium chloride flush (NS) 0.9 % injection 3 mL (3 mLs Intravenous Given 04/17/19 1958)  lactated ringers bolus 1,000 mL (1,000 mLs Intravenous Bolus from Bag  04/17/19 1958)  ondansetron (ZOFRAN) injection 4 mg (4 mg Intravenous Given 04/17/19 1959)  alum & mag hydroxide-simeth (MAALOX/MYLANTA) 200-200-20 MG/5ML suspension 15 mL (15 mLs Oral Given 04/17/19 1959)    ED Course  I have reviewed the triage vital signs and the nursing notes.  Pertinent labs & imaging results that were available during my care of the patient were reviewed by me and considered in my medical decision making (see chart for details).  39 year old female presents with GI illness and feeling dehydrated. Symptoms are reportedly resolving but she has persistent nausea and excess gas. Vital signs are reassuring. Heart is regular rate and rhythm. Lungs are CTA. Abdomen is soft and non-tender. Labs obtained in triage are reassuring. 1st and 2nd trop are normal. EKG is normal. Will give fluids, zofran, GI cocktail.  8:43 PM Rechecked pt. She is standing in the room and talking on the phone. She feels much better. She has tolerated PO. Fluids have not finished yet but she she was told she will be discharged soon and she was comfortable with this.  MDM Rules/Calculators/A&P                       Final Clinical Impression(s) / ED Diagnoses Final diagnoses:  Nausea vomiting and diarrhea    Rx / DC Orders ED Discharge Orders    None       Recardo Evangelist, PA-C 04/19/19 [id number]    Malvin Johns, MD 04/19/19 [phone]

## 2019-04-17 NOTE — ED Notes (Signed)
Patient notified contact

## 2019-08-09 NOTE — Progress Notes (Deleted)
NEUROLOGY FOLLOW UP OFFICE NOTE  HALYN FLAUGHER [id number]  HISTORY OF PRESENT ILLNESS: [name] is a 39year old right-handed woman with asthma, depression, anxiety and morbid obesity who follows up for migraine.  UPDATE: Started Emgality in February and tried Nurtec for rescue. Intensity:10/10 Duration: eases the intensity for about 4 hours but does not abort it.   Frequency: daily except for week of cycle. Current headache have been ongoing for 3 days. Flexeril ineffective.  Frequency of abortive therapy: 5-6 days a week. Current NSAIDS:Ibuprofen 800mg  Current analgesics:Acetaminophen 500mg  Current triptans:sumatriptan NS Current ergotamine:none Current anti-emetic:Zofran ODT 4mg  Current muscle relaxants:Flexeril 10mg  Current anti-anxiolytic:none Current sleep aide:none Current Antihypertensive medications:none Current Antidepressant medications:none Current Anticonvulsant medications:none Current anti-CGRP:Emgallity, Nurtec Current Vitamins/Herbal/Supplements:prenatal Current Antihistamines/Decongestants:none Other therapy:none Hormone/birth control:none  Caffeine: Amanda Alcohol: Amanda Smoker: Amanda Diet: Cut out pork, cheese, chocolate. Increased water intake Exercise: walking Depression/stress: stress Sleep hygiene: poor  HISTORY: Onset: adolescence Location:Holocephalic from neck and shoulders to behind the eyes Quality:Throbbing, pressure Initial intensity:10/10 Aura:Amanda Prodrome:Amanda Associated symptoms:Dizziness, nausea, photophobia, phonophobia Initial Duration:constant Initial Frequency:Daily (16-20 days severe) Triggers: Emotional stress, getting upset, menstrual cycle Relieving factors:none Activity:Difficult to function when severe  Her headaches improved during her pregnancy but returned post-partum.  Due to worsening headaches, an MRI of the brain with and  without contrast was performed on 11/04/2018 and was normal  Past NSAIDS:Advil, Aleve Past analgesics:BC powder Past abortive triptans:Sumatriptan 100mg , Maxalt, Relpax Past abortive ergotamine:none Past muscle relaxants:none Past anti-emetic:Zofran Past antihypertensive medications:would not use beta blockers due to underlying asthma. Past antidepressant medications:Nortriptyline 25mg  (side effects), Zoloft Past anticonvulsant medications:topiramate 100mg  (ineffective) Past anti-CGRP:Aimovig 70mg  (helped but it made her feel hot, lightheaded, nauseous with vomiting) Past vitamins/Herbal/Supplements:none Past antihistamines/decongestants:none Other past therapies:Trigger point injections  Family history of headaches: Amanda  PAST MEDICAL HISTORY: Past Medical History:  Diagnosis Date  . Anginal pain ([place])    r/t anxiety  . Anxiety   . Asthma    Amanda inhaler - seasonal  . Constipation   . Depression   . Dysrhythmia    Palpatations - r/t anxiety  . Genital warts   . GERD (gastroesophageal reflux disease)   . H/O hiatal hernia    surgery to repair  . Headache(784.0)   . Heart murmur   . History of low transverse cesarean section 02/12/2018  . MRSA cellulitis    greater than 10 yrs ago dx at urgent care   . Sickle cell trait ([place])     MEDICATIONS: Current Outpatient Medications on File Prior to Visit  Medication Sig Dispense Refill  . albuterol (VENTOLIN HFA) 108 (90 Base) MCG/ACT inhaler TAKE 2 PUFFS BY MOUTH 4 TIMES A DAY    . cetirizine (ZYRTEC) 10 MG tablet Take 10 mg by mouth daily.    . clotrimazole (LOTRIMIN) 1 % external solution Apply 1 application topically 2 (two) times daily. In between toes 60 mL 5  . cyclobenzaprine (FLEXERIL) 5 MG tablet Take 5-10 mg by mouth at bedtime as needed.    . famotidine (PEPCID) 20 MG tablet Take 20 mg by mouth 2 (two) times daily.    . Ferrous Sulfate (IRON PO) Take 1 tablet by mouth daily.    Marland Kitchen gabapentin  (NEURONTIN) 300 MG capsule TAKE 1 CAPSULE BY MOUTH AT BEDTIME    . Galcanezumab-gnlm (EMGALITY) 120 MG/ML SOAJ Inject 120 mg into the skin every 28 (twenty-eight) days. 1 pen 11  . linaclotide (LINZESS) 145 MCG CAPS capsule Take by mouth.    Marland Kitchen  nystatin-triamcinolone ointment (MYCOLOG) Apply 1 application topically 2 (two) times daily. 30 g 0  . ondansetron (ZOFRAN ODT) 4 MG disintegrating tablet Take 1 tablet (4 mg total) by mouth every 8 (eight) hours as needed for nausea or vomiting. 8 tablet 0  . Probiotic Product (PROBIOTIC PO) Take 1 tablet by mouth daily.    . Rimegepant Sulfate (NURTEC) 75 MG TBDP Take 1 tablet by mouth daily as needed (Maximum 1 tablet in 24 hours). 8 tablet 11  . SUMAtriptan ([name]) 20 MG/ACT nasal spray 1 spray in nostril.  May repeat x1 in 2 hours if headache persists or recurs. 10 Inhaler 3  . terbinafine (LAMISIL) 250 MG tablet Take 1 tablet (250 mg total) by mouth daily. 90 tablet 0  . venlafaxine XR (EFFEXOR-XR) 75 MG 24 hr capsule Take 1 capsule (75 mg total) by mouth daily. (Patient not taking: Reported on 03/15/2019) 90 capsule 3  . VITAMIN D PO Take by mouth. Takes 1 pill a week     Amanda current facility-administered medications on file prior to visit.    ALLERGIES: Allergies  Allergen Reactions  . Aimovig [Erenumab-Aooe]     Hot/lightheaded/nausea and vomiting  . Doxycycline Hyclate Hives, Itching and Swelling  . Ketorolac Tromethamine Other (See Comments)    Patient stated that it makes her extremely hyper  . Amoxicillin Hives, Swelling and Rash    Has patient had a PCN reaction causing immediate rash, facial/tongue/throat swelling, SOB or lightheadedness with hypotension: Unsure Has patient had a PCN reaction causing severe rash involving mucus membranes or skin necrosis: Unsure Has patient had a PCN reaction that required hospitalization Amanda Has patient had a PCN reaction occurring within the last 10 years: Amanda If all of the above answers are "Amanda",  then may proceed with Cephalosporin use.REACTION: facial swelling and rash.    FAMILY HISTORY: Family History  Problem Relation Age of Onset  . Hypertension Mother   . Depression Mother   . Depression Father   . Heart disease Maternal Grandmother   . Breast cancer Maternal Aunt        [employer]  . Cancer Maternal Aunt   . Diabetes Maternal Grandfather   . Heart disease Maternal Grandfather   . Hypertension Maternal Grandfather   . Congestive Heart Failure Paternal Grandfather   . Anesthesia problems Neg Hx     SOCIAL HISTORY: Social History   Socioeconomic History  . Marital status: Single    Spouse name: Not on file  . Number of children: 1  . Years of education: 12+  . Highest education level: Not on file  Occupational History  . Not on file  Tobacco Use  . Smoking status: Never Smoker  . Smokeless tobacco: Never Used  Vaping Use  . Vaping Use: Never used  Substance and Sexual Activity  . Alcohol use: Amanda    Alcohol/week: 0.0 standard drinks  . Drug use: Amanda  . Sexual activity: Yes    Partners: Male    Birth control/protection: Condom  Other Topics Concern  . Not on file  Social History Narrative   Regular exercise-Amanda   Caffeine Use-yes   Right handed   Two story house   Social Determinants of Health   Financial Resource Strain:   . Difficulty of Paying Living Expenses:   Food Insecurity:   . Worried About Charity fundraiser in the Last Year:   . Arboriculturist in the Last Year:   Transportation Needs:   .  Lack of Transportation (Medical):   Marland Kitchen Lack of Transportation (Non-Medical):   Physical Activity:   . Days of Exercise per Week:   . Minutes of Exercise per Session:   Stress:   . Feeling of Stress :   Social Connections:   . Frequency of Communication with Friends and Family:   . Frequency of Social Gatherings with Friends and Family:   . Attends Religious Services:   . Active Member of Clubs or Organizations:   . Attends Theatre manager Meetings:   Marland Kitchen Marital Status:   Intimate Partner Violence:   . Fear of Current or Ex-Partner:   . Emotionally Abused:   Marland Kitchen Physically Abused:   . Sexually Abused:     PHYSICAL EXAM: *** General: Amanda acute distress.  Patient appears well-groomed.   Head:  Normocephalic/atraumatic Eyes:  Fundi examined but not visualized Neck: supple, Amanda paraspinal tenderness, full range of motion Heart:  Regular rate and rhythm Lungs:  Clear to auscultation bilaterally Back: Amanda paraspinal tenderness Neurological Exam: alert and oriented to person, place, and time. Attention span and concentration intact, recent and remote memory intact, fund of knowledge intact.  Speech fluent and not dysarthric, language intact.  CN II-XII intact. Bulk and tone normal, muscle strength 5/5 throughout.  Sensation to light touch, temperature and vibration intact.  Deep tendon reflexes 2+ throughout, toes downgoing.  Finger to nose and heel to shin testing intact.  Gait normal, Romberg negative.  IMPRESSION: Migraine without aura, without status migrainosus, not intractable  PLAN: 1.  For preventative management, *** 2.  For abortive therapy, *** 3.  Limit use of pain relievers to Amanda more than 2 days out of week to prevent risk of rebound or medication-overuse headache. 4.  Keep headache diary 5.  Exercise, hydration, caffeine cessation, sleep hygiene, monitor for and avoid triggers 6. Follow up ***   Metta Clines, DO  CC: Raelyn Number, PA

## 2019-08-12 ENCOUNTER — Ambulatory Visit: Payer: Medicaid Other | Admitting: Neurology

## 2019-09-23 ENCOUNTER — Other Ambulatory Visit: Payer: Self-pay

## 2019-09-23 ENCOUNTER — Ambulatory Visit (INDEPENDENT_AMBULATORY_CARE_PROVIDER_SITE_OTHER): Payer: Medicaid Other

## 2019-09-23 ENCOUNTER — Ambulatory Visit: Payer: Medicaid Other | Admitting: Podiatry

## 2019-09-23 DIAGNOSIS — S9031XA Contusion of right foot, initial encounter: Secondary | ICD-10-CM | POA: Diagnosis not present

## 2019-09-23 NOTE — Progress Notes (Signed)
  Subjective:  Patient ID: Amanda [name], female    DOB: 1980-10-19,  MRN: [id number]  Chief Complaint  Patient presents with  . Foot Injury    right, jammed foot into refridgerator on Thursday    39 y.o. female presents with the above complaint. History confirmed with patient.  Painful and swollen, bruising is improving  Objective:  Physical Exam: warm, good capillary refill, no trophic changes or ulcerative lesions, normal DP and PT pulses and normal sensory exam.   Right Foot: Mild tenderness over the dorsal forefoot and with passive plantarflexion stretch of the long extensors  Radiographs: X-ray of the right foot: no fracture, dislocation, swelling or degenerative changes noted Assessment:   1. Contusion of right foot, initial encounter      Plan:  Patient was evaluated and treated and all questions answered.   Advised her that she likely has a contusion from jamming the foot from direct impact.  Advised to wear stiff shoes, she also has a CAM boot and she may wear this again which would be comfortable.  She will return in 3 weeks for new x-ray to evaluate for stress fracture.  Return in about 3 weeks (around 10/14/2019) for new x-ray, check injury.

## 2019-10-17 ENCOUNTER — Other Ambulatory Visit: Payer: Self-pay

## 2019-10-17 ENCOUNTER — Ambulatory Visit (INDEPENDENT_AMBULATORY_CARE_PROVIDER_SITE_OTHER): Payer: Medicaid Other | Admitting: Sports Medicine

## 2019-10-17 ENCOUNTER — Ambulatory Visit (INDEPENDENT_AMBULATORY_CARE_PROVIDER_SITE_OTHER): Payer: Medicaid Other

## 2019-10-17 ENCOUNTER — Encounter: Payer: Self-pay | Admitting: Sports Medicine

## 2019-10-17 DIAGNOSIS — S9031XD Contusion of right foot, subsequent encounter: Secondary | ICD-10-CM

## 2019-10-17 DIAGNOSIS — M79671 Pain in right foot: Secondary | ICD-10-CM

## 2019-10-17 NOTE — Progress Notes (Signed)
Subjective: Amanda [name] is a 39 y.o. female patient who presents to office for evaluation of right foot pain. Patient reports that her right foot is still sore across the top but feels much better than it did last time, still has pain but otherwise is able to tolerate a normal shoe.  Patient admits to ongoing heel pain but states that it is tolerable at this time and she is not ready for another shot of medicine.  No other pedal complaints noted. Patient Active Problem List   Diagnosis Date Noted  . Screening for cardiovascular condition 12/07/2018  . Mixed hyperlipidemia 12/07/2018  . Benign gestational thrombocytopenia ([place]) 11/06/2018  . Status post repeat low transverse cesarean section 02/13/2018  . History of low transverse cesarean section 02/12/2018  . Decreased fetal movement 02/07/2018  . Migraine 09/03/2017  . Maternal age 72+, multigravida, antepartum 07/24/2017  . Maternal obesity affecting pregnancy, antepartum 07/24/2017  . Constipation 07/24/2017  . Sickle cell trait ([place]) 07/24/2017  . 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 Internal derangement of right knee 03/13/2017  . 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 Morning sickness 01/05/2017  . Abdominal pain affecting pregnancy 01/05/2017  . Suprapubic pain 01/05/2017  . Bacterial vaginitis 12/08/2016  . Low back pain 12/16/2015  . H. pylori infection 11/02/2015  . Intractable chronic migraine without aura and without status migrainosus 03/19/2015  . Morbid obesity ([place]) 03/19/2015  . Murmur 06/20/2012  . INSOMNIA 08/14/2009  . DYSMENORRHEA 12/09/2008  . FATIGUE 03/25/2008  . DEPRESSION/ANXIETY 09/11/2007  . HEADACHE 09/11/2007  . PALPITATIONS 02/23/2007  . Atypical chest pain 02/23/2007  . ALLERGIC RHINITIS 10/06/2006  . ASTHMA 10/06/2006  . GERD 10/06/2006  . Asthma 10/06/2006    Current Outpatient Medications on File Prior to Visit  Medication Sig Dispense Refill  . albuterol (VENTOLIN HFA) 108 (90 Base)  MCG/ACT inhaler TAKE 2 PUFFS BY MOUTH 4 TIMES A DAY    . benzonatate (TESSALON) 100 MG capsule Take 100 mg by mouth 3 (three) times daily.    . cetirizine (ZYRTEC) 10 MG tablet Take 10 mg by mouth daily.    . clotrimazole (LOTRIMIN) 1 % external solution Apply 1 application topically 2 (two) times daily. In between toes 60 mL 5  . cyclobenzaprine (FLEXERIL) 5 MG tablet Take 5-10 mg by mouth at bedtime as needed.    . famotidine (PEPCID) 20 MG tablet Take 20 mg by mouth 2 (two) times daily.    . Ferrous Sulfate (IRON PO) Take 1 tablet by mouth daily.    Marland Kitchen gabapentin (NEURONTIN) 300 MG capsule TAKE 1 CAPSULE BY MOUTH AT BEDTIME    . Galcanezumab-gnlm (EMGALITY) 120 MG/ML SOAJ Inject 120 mg into the skin every 28 (twenty-eight) days. 1 pen 11  . linaclotide (LINZESS) 145 MCG CAPS capsule Take by mouth.    . nystatin-triamcinolone ointment (MYCOLOG) Apply 1 application topically 2 (two) times daily. 30 g 0  . omeprazole (PRILOSEC) 20 MG capsule Take 20 mg by mouth 2 (two) times daily.    . ondansetron (ZOFRAN ODT) 4 MG disintegrating tablet Take 1 tablet (4 mg total) by mouth every 8 (eight) hours as needed for nausea or vomiting. 8 tablet 0  . Probiotic Product (PROBIOTIC PO) Take 1 tablet by mouth daily.    . promethazine-dextromethorphan (PROMETHAZINE-DM) 6.25-15 MG/5ML syrup Take 5 mLs by mouth every 6 (six) hours.    . Rimegepant Sulfate (NURTEC) 75 MG TBDP Take 1 tablet by mouth daily as needed (Maximum 1 tablet in 24  hours). 8 tablet 11  . SUMAtriptan ([name]) 20 MG/ACT nasal spray 1 spray in nostril.  May repeat x1 in 2 hours if headache persists or recurs. 10 Inhaler 3  . terbinafine (LAMISIL) 250 MG tablet Take 1 tablet (250 mg total) by mouth daily. 90 tablet 0  . venlafaxine XR (EFFEXOR-XR) 75 MG 24 hr capsule Take 1 capsule (75 mg total) by mouth daily. (Patient not taking: Reported on 03/15/2019) 90 capsule 3  . VITAMIN D PO Take by mouth. Takes 1 pill a week    . Vitamin D,  Ergocalciferol, (DRISDOL) 1.25 MG (50000 UNIT) CAPS capsule Take 50,000 Units by mouth once a week.     No current facility-administered medications on file prior to visit.    Allergies  Allergen Reactions  . Aimovig [Erenumab-Aooe]     Hot/lightheaded/nausea and vomiting  . Doxycycline Hyclate Hives, Itching and Swelling  . Ketorolac Tromethamine Other (See Comments)    Patient stated that it makes her extremely hyper  . Amoxicillin Hives, Swelling and Rash    Has patient had a PCN reaction causing immediate rash, facial/tongue/throat swelling, SOB or lightheadedness with hypotension: Unsure Has patient had a PCN reaction causing severe rash involving mucus membranes or skin necrosis: Unsure Has patient had a PCN reaction that required hospitalization No Has patient had a PCN reaction occurring within the last 10 years: No If all of the above answers are "NO", then may proceed with Cephalosporin use.REACTION: facial swelling and rash.    Objective:  General: Alert and oriented x3 in no acute distress  Dermatology: No open lesions bilateral lower extremities, no webspace macerations, no ecchymosis bilateral, all nails x 10 are well manicured.  Dry scaly skin plantar surfaces bilateral consistent with history of tinea that appears to be resolving.  Vascular: Dorsalis Pedis and Posterior Tibial pedal pulses palpable, Capillary Fill Time 3 seconds,(+) pedal hair growth bilateral, no edema bilateral lower extremities, Temperature gradient within normal limits.  Neurology: Johney Maine sensation intact via light touch bilateral.  Musculoskeletal: Mild tenderness with palpation at right dorsal foot over the lesser MPJs that radiates to the midfoot along the fourth and fifth metatarsal ray on the right foot.  There is also pain to the insertion of plantar fascia that is chronic in nature on the right.  Pes planus foot type.  Gait: Antalgic gait  Xrays  Right foot   Impression: No acute fracture  or dislocation, there is joint space narrowing at the first metatarsophalangeal joint with dorsal bone spur consistent with hallux limitus, there is also mid tarsal breech supportive of pes planus, and inferior calcaneal heel spur present.  Assessment and Plan: Problem List Items Addressed This Visit    None    Visit Diagnoses    Contusion of right foot, subsequent encounter    -  Primary   Relevant Orders   DG Foot Complete Right   Right foot pain           -Complete examination performed -Xrays reviewed -Discussed treatement options for continued pain related to contusion and chronic unchanged plantar fasciitis -Patient declined prescription for NSAIDs at this time -Advised patient to continue with good supportive shoes daily rest ice elevation till symptoms improve -Advised patient to return to office if her plantar fascia worsens for a follow-up evaluation of this concern for a steroid shot -Patient to return to office as needed or sooner if condition worsens.  Landis Martins, DPM

## 2019-12-25 NOTE — Progress Notes (Signed)
Virtual Visit via Video Note The purpose of this virtual visit is to provide medical care while limiting exposure to the novel coronavirus.    Consent was obtained for video visit:  Yes.   Answered questions that patient had about telehealth interaction:  Yes.   I discussed the limitations, risks, security and privacy concerns of performing an evaluation and management service by telemedicine. I also discussed with the patient that there may be a patient responsible charge related to this service. The patient expressed understanding and agreed to proceed.  Pt location: Home Physician Location: office Name of referring provider:  Trey Sailors, PA I connected with Amanda [name] at patients initiation/request on 12/27/2019 at 10:10 AM EST by video enabled telemedicine application and verified that I am speaking with the correct person using two identifiers. Pt MRN:  [id number] Pt DOB:  [birth year] Video Participants:  Amanda [name]   History of Present Illness:  Amanda [name] is a 39year old right-handed woman with asthma, depression, anxiety and morbid obesity who follows up for chronic migraine.   UPDATE: Last seen in February.  Didn't want to take Emgality due to taking a shot.  Never picked up Nurtec. Has been treating headaches with Midol and tizanidine.  Still uses sumatriptan NS.  She would like to revisit Botox.  Intensity:10/10 Duration: eases the intensity for about 4 hours but does not abort it.   Frequency: daily Midol Current headache have been ongoing for 3 days.   Frequency of abortive therapy: 5-6 days a week. Current NSAIDS:Midol Current analgesics:none Current triptans:sumatriptan NS Current ergotamine:none Current anti-emetic:Zofran ODT 4mg  Current muscle relaxants:tizanidine PRN for headache Current anti-anxiolytic:none Current sleep aide:none Current Antihypertensive medications:none Current  Antidepressant medications:none Current Anticonvulsant medications:none Current anti-CGRP:Emgality, Nurtec (rescue) Current Vitamins/Herbal/Supplements:prenatal Current Antihistamines/Decongestants:none Other therapy:none Hormone/birth control:none  Caffeine: no Alcohol: no Smoker: no Diet: Cut out pork, cheese, chocolate. Increased water intake Exercise: walking Depression/stress: stress Sleep hygiene: poor  HISTORY: Onset: adolescence Location:Holocephalic from neck and shoulders to behind the eyes Quality:Throbbing, pressure Initial intensity:10/10 Aura:no Prodrome:no Associated symptoms:Dizziness, nausea, photophobia, phonophobia Initial Duration:constant Initial Frequency:Daily (16-20 days severe) Triggers: Emotional stress, getting upset, menstrual cycle Relieving factors:none Activity:Difficult to function when severe  Her headaches improved during her pregnancy but returned post-partum.  Due to worsening headaches, an MRI of the brain with and without contrast was performed on 11/04/2018 and was normal.  Past NSAIDS:Advil, Aleve Past analgesics:BC powder, Tylenol Past abortive triptans:Sumatriptan 100mg , Maxalt, Relpax Past abortive ergotamine:none Past muscle relaxants:Flexeril 10mg  Past anti-emetic:Zofran Past antihypertensive medications:would not use beta blockers due to underlying asthma. Past antidepressant medications:Nortriptyline 25mg  (side effects), Zoloft Past anticonvulsant medications:topiramate 100mg  (ineffective) Past anti-CGRP:Aimovig 70mg  (helped but it made her feel hot, lightheaded, nauseous with vomiting) Past vitamins/Herbal/Supplements:none Past antihistamines/decongestants:none Other past therapies:Trigger point injections  Family history of headaches: No  Past Medical History: Past Medical History:  Diagnosis Date  . Anginal pain ([place])    r/t anxiety  .  Anxiety   . Asthma    no inhaler - seasonal  . Constipation   . Depression   . Dysrhythmia    Palpatations - r/t anxiety  . Genital warts   . GERD (gastroesophageal reflux disease)   . H/O hiatal hernia    surgery to repair  . Headache(784.0)   . Heart murmur   . History of low transverse cesarean section 02/12/2018  . MRSA cellulitis    greater than 10 yrs ago dx at urgent care   . Sickle cell  trait ([place])     Medications: Outpatient Encounter Medications as of 12/27/2019  Medication Sig  . albuterol (VENTOLIN HFA) 108 (90 Base) MCG/ACT inhaler TAKE 2 PUFFS BY MOUTH 4 TIMES A DAY  . benzonatate (TESSALON) 100 MG capsule Take 100 mg by mouth 3 (three) times daily.  . cetirizine (ZYRTEC) 10 MG tablet Take 10 mg by mouth daily.  . clotrimazole (LOTRIMIN) 1 % external solution Apply 1 application topically 2 (two) times daily. In between toes  . cyclobenzaprine (FLEXERIL) 5 MG tablet Take 5-10 mg by mouth at bedtime as needed.  . famotidine (PEPCID) 20 MG tablet Take 20 mg by mouth 2 (two) times daily.  . Ferrous Sulfate (IRON PO) Take 1 tablet by mouth daily.  Marland Kitchen gabapentin (NEURONTIN) 300 MG capsule TAKE 1 CAPSULE BY MOUTH AT BEDTIME  . Galcanezumab-gnlm (EMGALITY) 120 MG/ML SOAJ Inject 120 mg into the skin every 28 (twenty-eight) days.  Marland Kitchen linaclotide (LINZESS) 145 MCG CAPS capsule Take by mouth.  . nystatin-triamcinolone ointment (MYCOLOG) Apply 1 application topically 2 (two) times daily.  Marland Kitchen omeprazole (PRILOSEC) 20 MG capsule Take 20 mg by mouth 2 (two) times daily.  . ondansetron (ZOFRAN ODT) 4 MG disintegrating tablet Take 1 tablet (4 mg total) by mouth every 8 (eight) hours as needed for nausea or vomiting.  . Probiotic Product (PROBIOTIC PO) Take 1 tablet by mouth daily.  . promethazine-dextromethorphan (PROMETHAZINE-DM) 6.25-15 MG/5ML syrup Take 5 mLs by mouth every 6 (six) hours.  . Rimegepant Sulfate (NURTEC) 75 MG TBDP Take 1 tablet by mouth daily as needed (Maximum 1  tablet in 24 hours).  . SUMAtriptan ([name]) 20 MG/ACT nasal spray 1 spray in nostril.  May repeat x1 in 2 hours if headache persists or recurs.  . terbinafine (LAMISIL) 250 MG tablet Take 1 tablet (250 mg total) by mouth daily.  Marland Kitchen venlafaxine XR (EFFEXOR-XR) 75 MG 24 hr capsule Take 1 capsule (75 mg total) by mouth daily. (Patient not taking: Reported on 03/15/2019)  . VITAMIN D PO Take by mouth. Takes 1 pill a week  . Vitamin D, Ergocalciferol, (DRISDOL) 1.25 MG (50000 UNIT) CAPS capsule Take 50,000 Units by mouth once a week.   No facility-administered encounter medications on file as of 12/27/2019.    Allergies: Allergies  Allergen Reactions  . Aimovig [Erenumab-Aooe]     Hot/lightheaded/nausea and vomiting  . Doxycycline Hyclate Hives, Itching and Swelling  . Ketorolac Tromethamine Other (See Comments)    Patient stated that it makes her extremely hyper  . Amoxicillin Hives, Swelling and Rash    Has patient had a PCN reaction causing immediate rash, facial/tongue/throat swelling, SOB or lightheadedness with hypotension: Unsure Has patient had a PCN reaction causing severe rash involving mucus membranes or skin necrosis: Unsure Has patient had a PCN reaction that required hospitalization No Has patient had a PCN reaction occurring within the last 10 years: No If all of the above answers are "NO", then may proceed with Cephalosporin use.REACTION: facial swelling and rash.    Family History: Family History  Problem Relation Age of Onset  . Hypertension Mother   . Depression Mother   . Depression Father   . Heart disease Maternal Grandmother   . Breast cancer Maternal Aunt        [employer]  . Cancer Maternal Aunt   . Diabetes Maternal Grandfather   . Heart disease Maternal Grandfather   . Hypertension Maternal Grandfather   . Congestive Heart Failure Paternal Grandfather   . Anesthesia  problems Neg Hx     Social History: Social History   Socioeconomic History  .  Marital status: Single    Spouse name: Not on file  . Number of children: 1  . Years of education: 12+  . Highest education level: Not on file  Occupational History  . Not on file  Tobacco Use  . Smoking status: Never Smoker  . Smokeless tobacco: Never Used  Vaping Use  . Vaping Use: Never used  Substance and Sexual Activity  . Alcohol use: No    Alcohol/week: 0.0 standard drinks  . Drug use: No  . Sexual activity: Yes    Partners: Male    Birth control/protection: Condom  Other Topics Concern  . Not on file  Social History Narrative   Regular exercise-no   Caffeine Use-yes   Right handed   Two story house   Social Determinants of Health   Financial Resource Strain:   . Difficulty of Paying Living Expenses: Not on file  Food Insecurity:   . Worried About Charity fundraiser in the Last Year: Not on file  . Ran Out of Food in the Last Year: Not on file  Transportation Needs:   . Lack of Transportation (Medical): Not on file  . Lack of Transportation (Non-Medical): Not on file  Physical Activity:   . Days of Exercise per Week: Not on file  . Minutes of Exercise per Session: Not on file  Stress:   . Feeling of Stress : Not on file  Social Connections:   . Frequency of Communication with Friends and Family: Not on file  . Frequency of Social Gatherings with Friends and Family: Not on file  . Attends Religious Services: Not on file  . Active Member of Clubs or Organizations: Not on file  . Attends Archivist Meetings: Not on file  . Marital Status: Not on file  Intimate Partner Violence:   . Fear of Current or Ex-Partner: Not on file  . Emotionally Abused: Not on file  . Physically Abused: Not on file  . Sexually Abused: Not on file    Observations/Objective:   None  Assessment and Plan:   Migraine without aura, without status migrainosus, not intractable  1.  Migraine prevention:  Botox 2.  Migraine rescue:  sumatriptan NS.  Will try Nurtec.  May  use tizanidine if needed. 3.  Limit use of pain relievers to no more than 2 days out of week to prevent risk of rebound or medication-overuse headache. 4.  Keep headache diary 5.  Follow up for Botox  Follow Up Instructions:    -I discussed the assessment and treatment plan with the patient. The patient was provided an opportunity to ask questions and all were answered. The patient agreed with the plan and demonstrated an understanding of the instructions.   The patient was advised to call back or seek an in-person evaluation if the symptoms worsen or if the condition fails to improve as anticipated.   Dudley Major, DO

## 2019-12-27 ENCOUNTER — Encounter: Payer: Self-pay | Admitting: Neurology

## 2019-12-27 ENCOUNTER — Telehealth (INDEPENDENT_AMBULATORY_CARE_PROVIDER_SITE_OTHER): Payer: Medicaid Other | Admitting: Neurology

## 2019-12-27 ENCOUNTER — Other Ambulatory Visit: Payer: Self-pay

## 2019-12-27 DIAGNOSIS — G43719 Chronic migraine without aura, intractable, without status migrainosus: Secondary | ICD-10-CM

## 2019-12-27 MED ORDER — NURTEC 75 MG PO TBDP
1.0000 | ORAL_TABLET | Freq: Every day | ORAL | 5 refills | Status: DC | PRN
Start: 2019-12-27 — End: 2020-07-14

## 2019-12-27 NOTE — Patient Instructions (Signed)
botox Nurtec

## 2019-12-30 ENCOUNTER — Encounter: Payer: Self-pay | Admitting: Neurology

## 2019-12-30 NOTE — Progress Notes (Signed)
Per BV through [facility].   Submitted PA through [facility] #: [id number] W pending 12/30/19.   Once PA approved will call [facility] and set up account/ give verbal script for the Botox. Also will give PA info for faster turn around.

## 2019-12-31 ENCOUNTER — Ambulatory Visit (HOSPITAL_COMMUNITY)
Admission: EM | Admit: 2019-12-31 | Discharge: 2019-12-31 | Disposition: A | Discharge status: Home / Self Care | Payer: Medicaid Other

## 2019-12-31 NOTE — Progress Notes (Addendum)
12/7- called [facility] and set up patient's account and gave verbal script for the Botox.  PA info: Approval for Botox valid from 01/08/20 to 01/02/21 for PA #:[id number]. Call ref #: [id number].   12/16- Called [facility] to give them PA info. Per rep Delivery is set up for Tuesday 01/14/20. I LMOM with patient to schedule appt.  12/21- LMOM with patient to schedule Botox appt

## 2020-04-10 ENCOUNTER — Ambulatory Visit: Payer: Medicaid Other | Admitting: Neurology

## 2020-04-10 ENCOUNTER — Telehealth (INDEPENDENT_AMBULATORY_CARE_PROVIDER_SITE_OTHER): Payer: Medicaid Other | Admitting: Neurology

## 2020-04-10 ENCOUNTER — Other Ambulatory Visit: Payer: Self-pay

## 2020-04-10 ENCOUNTER — Encounter: Payer: Self-pay | Admitting: Neurology

## 2020-04-10 DIAGNOSIS — M7918 Myalgia, other site: Secondary | ICD-10-CM | POA: Diagnosis not present

## 2020-04-10 DIAGNOSIS — G43719 Chronic migraine without aura, intractable, without status migrainosus: Secondary | ICD-10-CM | POA: Diagnosis not present

## 2020-04-10 MED ORDER — SUMATRIPTAN 20 MG/ACT NA SOLN
NASAL | 3 refills | Status: DC
Start: 2020-04-10 — End: 2020-07-14

## 2020-04-10 NOTE — Progress Notes (Signed)
Virtual Visit via Video Note The purpose of this virtual visit is to provide medical care while limiting exposure to the novel coronavirus.    Consent was obtained for video visit:  Yes.   Answered questions that patient had about telehealth interaction:  Yes.   I discussed the limitations, risks, security and privacy concerns of performing an evaluation and management service by telemedicine. I also discussed with the patient that there may be a patient responsible charge related to this service. The patient expressed understanding and agreed to proceed.  Pt location: Home Physician Location: office Name of referring provider:  Trey Sailors, PA I connected with Amanda [name] at patients initiation/request on 04/10/2020 at 11:30 AM EDT by video enabled telemedicine application and verified that I am speaking with the correct person using two identifiers. Pt MRN:  [id number] Pt DOB:  08/16/1980 Video Participants:  Amanda [name];  Assessment and Plan:   Chronic migraine without aura, without status migrainosus, not intractable 1.  She declines Botox at this time and, instead, wants to see how she does with the chiropractor. 2.  For rescue therapy, she has sumatriptan NS.  Advised to take tizanidine as needed as it may help with her associated cervical myofascial pain. 3.  Limit use of pain relievers to no more than 2 days out of week to prevent risk of rebound or medication-overuse headache. 4.  Keep headache diary 5.  She has a Botox appointment in June.  Will change that to a routine follow up office visit.  History of Present Illness:  Amanda [name] is a 40year old right-handed woman with asthma, depression, anxiety and morbid obesity who follows up for chronic migraine.   UPDATE: Patient was scheduled for Botox today.  She has decided to not proceed with Botox.  She feels uncomfortable about the procedure.  She started seeing a chiropractor a month  ago, already had 8 visits for adjustments with 3 visits left.  From there, plan is to continue with deep tissue massage.  She notes some improvement already.  Sumatriptan NS effective.  Nurtec ineffective for rescue.  She has tizanidine but has not been taking it.    Current NSAIDS:Midol Current analgesics:none Current triptans:sumatriptan NS Current ergotamine:none Current anti-emetic:Zofran ODT 4mg  Current muscle relaxants:tizanidine PRN for headache Current anti-anxiolytic:none Current sleep aide:none Current Antihypertensive medications:none Current Antidepressant medications:none Current Anticonvulsant medications:none Current anti-CGRP:Emgality, Nurtec (rescue) Current Vitamins/Herbal/Supplements:prenatal Current Antihistamines/Decongestants:none Other therapy:none Hormone/birth control:none  Caffeine: no Alcohol: no Smoker: no Diet: Cut out pork, cheese, chocolate. Increased water intake Exercise: walking Depression/stress: stress Sleep hygiene: poor  HISTORY: Onset: adolescence Location:Holocephalic from neck and shoulders to behind the eyes Quality:Throbbing, pressure Initial intensity:10/10 Aura:no Prodrome:no Associated symptoms:Dizziness, nausea, photophobia, phonophobia Initial Duration:constant Initial Frequency:Daily (16-20 days severe) Triggers: Emotional stress, getting upset, menstrual cycle Relieving factors:none Activity:Difficult to function when severe  Her headaches improved during her pregnancy but returned post-partum.  Due to worsening headaches, an MRI of the brain with and without contrast was performed on 11/04/2018 and was normal.  Past NSAIDS:Advil, Aleve Past analgesics:BC powder, Tylenol Past abortive triptans:Sumatriptan 100mg , Maxalt, Relpax Past abortive ergotamine:none Past muscle relaxants:Flexeril 10mg  Past anti-emetic:Zofran Past antihypertensive  medications:would not use beta blockers due to underlying asthma. Past antidepressant medications:Nortriptyline 25mg  (side effects), Zoloft Past anticonvulsant medications:topiramate 100mg  (ineffective) Past anti-CGRP:Aimovig 70mg  (helped but itmade her feel hot, lightheaded, nauseous with vomiting), Nurtec (rescue, ineffective) Past vitamins/Herbal/Supplements:none Past antihistamines/decongestants:none Other past therapies:Trigger point injections  Family history of headaches: No  Past  Medical History: Past Medical History:  Diagnosis Date  . Anginal pain ([place])    r/t anxiety  . Anxiety   . Asthma    no inhaler - seasonal  . Constipation   . Depression   . Dysrhythmia    Palpatations - r/t anxiety  . Genital warts   . GERD (gastroesophageal reflux disease)   . H/O hiatal hernia    surgery to repair  . Headache(784.0)   . Heart murmur   . History of low transverse cesarean section 02/12/2018  . MRSA cellulitis    greater than 10 yrs ago dx at urgent care   . Sickle cell trait (HCC)     Medications: Outpatient Encounter Medications as of 04/10/2020  Medication Sig  . albuterol (VENTOLIN HFA) 108 (90 Base) MCG/ACT inhaler TAKE 2 PUFFS BY MOUTH 4 TIMES A DAY  . benzonatate (TESSALON) 100 MG capsule Take 100 mg by mouth 3 (three) times daily.  . cetirizine (ZYRTEC) 10 MG tablet Take 10 mg by mouth daily.  . clotrimazole (LOTRIMIN) 1 % external solution Apply 1 application topically 2 (two) times daily. In between toes  . cyclobenzaprine (FLEXERIL) 5 MG tablet Take 5-10 mg by mouth at bedtime as needed.  . famotidine (PEPCID) 20 MG tablet Take 20 mg by mouth 2 (two) times daily.  . Ferrous Sulfate (IRON PO) Take 1 tablet by mouth daily.  Marland Kitchen gabapentin (NEURONTIN) 300 MG capsule TAKE 1 CAPSULE BY MOUTH AT BEDTIME  . Galcanezumab-gnlm (EMGALITY) 120 MG/ML SOAJ Inject 120 mg into the skin every 28 (twenty-eight) days. (Patient not taking: Reported on 12/27/2019)  .  linaclotide (LINZESS) 145 MCG CAPS capsule Take by mouth.  . nystatin-triamcinolone ointment (MYCOLOG) Apply 1 application topically 2 (two) times daily.  Marland Kitchen omeprazole (PRILOSEC) 20 MG capsule Take 20 mg by mouth 2 (two) times daily.  . ondansetron (ZOFRAN ODT) 4 MG disintegrating tablet Take 1 tablet (4 mg total) by mouth every 8 (eight) hours as needed for nausea or vomiting.  . Probiotic Product (PROBIOTIC PO) Take 1 tablet by mouth daily.  . promethazine-dextromethorphan (PROMETHAZINE-DM) 6.25-15 MG/5ML syrup Take 5 mLs by mouth every 6 (six) hours.  . Rimegepant Sulfate (NURTEC) 75 MG TBDP Take 1 tablet by mouth daily as needed (Maximum 1 tablet in 24 hours).  . SUMAtriptan ([name]) 20 MG/ACT nasal spray 1 spray in nostril.  May repeat x1 in 2 hours if headache persists or recurs.  . terbinafine (LAMISIL) 250 MG tablet Take 1 tablet (250 mg total) by mouth daily.  Marland Kitchen venlafaxine XR (EFFEXOR-XR) 75 MG 24 hr capsule Take 1 capsule (75 mg total) by mouth daily. (Patient not taking: Reported on 03/15/2019)  . VITAMIN D PO Take by mouth. Takes 1 pill a week  . Vitamin D, Ergocalciferol, (DRISDOL) 1.25 MG (50000 UNIT) CAPS capsule Take 50,000 Units by mouth once a week.   No facility-administered encounter medications on file as of 04/10/2020.    Allergies: Allergies  Allergen Reactions  . Aimovig [Erenumab-Aooe]     Hot/lightheaded/nausea and vomiting  . Doxycycline Hyclate Hives, Itching and Swelling  . Ketorolac Tromethamine Other (See Comments)    Patient stated that it makes her extremely hyper  . Amoxicillin Hives, Swelling and Rash    Has patient had a PCN reaction causing immediate rash, facial/tongue/throat swelling, SOB or lightheadedness with hypotension: Unsure Has patient had a PCN reaction causing severe rash involving mucus membranes or skin necrosis: Unsure Has patient had a PCN reaction that required hospitalization  No Has patient had a PCN reaction occurring within the last  10 years: No If all of the above answers are "NO", then may proceed with Cephalosporin use.REACTION: facial swelling and rash.    Family History: Family History  Problem Relation Age of Onset  . Hypertension Mother   . Depression Mother   . Depression Father   . Heart disease Maternal Grandmother   . Breast cancer Maternal Aunt        [employer]  . Cancer Maternal Aunt   . Diabetes Maternal Grandfather   . Heart disease Maternal Grandfather   . Hypertension Maternal Grandfather   . Congestive Heart Failure Paternal Grandfather   . Anesthesia problems Neg Hx     Observations/Objective:   No vitals. No acute distress.  Alert and oriented.  Speech fluent and not dysarthric.  Language intact.     Follow Up Instructions:    -I discussed the assessment and treatment plan with the patient. The patient was provided an opportunity to ask questions and all were answered. The patient agreed with the plan and demonstrated an understanding of the instructions.   The patient was advised to call back or seek an in-person evaluation if the symptoms worsen or if the condition fails to improve as anticipated.  Dudley Major, DO

## 2020-07-10 ENCOUNTER — Ambulatory Visit: Payer: Medicaid Other | Admitting: Neurology

## 2020-07-13 NOTE — Progress Notes (Signed)
Virtual Visit via Video Note The purpose of this virtual visit is to provide medical care while limiting exposure to the novel coronavirus.    Consent was obtained for video visit:  Yes.   Answered questions that patient had about telehealth interaction:  Yes.   I discussed the limitations, risks, security and privacy concerns of performing an evaluation and management service by telemedicine. I also discussed with the patient that there may be a patient responsible charge related to this service. The patient expressed understanding and agreed to proceed.  Pt location: Home Physician Location: office Name of referring provider:  Trey Sailors, PA I connected with [name] at patients initiation/request on 07/14/2020 at  8:30 AM EDT by video enabled telemedicine application and verified that I am speaking with the correct person using two identifiers. Pt MRN:  [id number] Pt DOB:  1980-07-03 Video Participants:  [name]  Assessment and Plan:   Chronic migraine without aura, without status migrainosus, not intractable  Migraine prevention:  Qulipta 60mg  daly Migraine rescue:  Sumatriptan NS or Midol.  Zofran for nausea. Limit use of pain relievers to no more than 2 days out of week to prevent risk of rebound or medication-overuse headache. Keep headache diary Follow up 6 months.  History of Present Illness:  Amanda [name] is a 40 year old right-handed woman with asthma, depression, anxiety and morbid obesity who follows up for chronic migraine.     UPDATE: Since last visit, continued therapy with the chiropractor.   Intensity:  moderate Duration:  30 minutes with Midol Frequency:  15 days a month Frequency of Midol:  daily Current NSAIDS:  Midol Current analgesics:  none Current triptans:  none Current ergotamine:  none Current anti-emetic:  Zofran ODT 4mg  Current muscle relaxants:  tizanidine PRN for headache Current anti-anxiolytic:   none Current sleep aide:  none Current Antihypertensive medications:  none Current Antidepressant medications:  none Current Anticonvulsant medications:  none Current anti-CGRP:  none Current Vitamins/Herbal/Supplements:  prenatal Current Antihistamines/Decongestants:  none Other therapy:  chiropractic medicine Hormone/birth control:  none   Caffeine:  no Alcohol:  no Smoker:  no Diet:  Cut out pork, cheese, chocolate.  Increased water intake Exercise:  walking Depression/stress:  stress Sleep hygiene:  poor   HISTORY: Onset:  adolescence Location:  Holocephalic from neck and shoulders to behind the eyes Quality:  Throbbing, pressure Initial intensity:  10/10 Aura:  no Prodrome:  no Associated symptoms:  Dizziness, nausea, photophobia, phonophobia Initial Duration:  constant Initial Frequency:  Daily (16-20 days severe) Triggers:  Emotional stress, getting upset, menstrual cycle Relieving factors:  none Activity:  Difficult to function when severe   Her headaches improved during her pregnancy but returned post-partum.   Due to worsening headaches, an MRI of the brain with and without contrast was performed on 11/04/2018 and was normal.   Past NSAIDS:  Advil, Aleve Past analgesics:  BC powder, Tylenol Past abortive triptans:  Sumatriptan 100mg , sumatriptan NS, Maxalt, Relpax Past abortive ergotamine:  none Past muscle relaxants:  Flexeril 10mg  Past anti-emetic:  Zofran Past antihypertensive medications:  would not use beta blockers due to underlying asthma. Past antidepressant medications:  Nortriptyline 25mg  (side effects), Zoloft Past anticonvulsant medications:  topiramate 100mg  (ineffective) Past anti-CGRP:  Aimovig 70mg  (helped but it made her feel hot, lightheaded, nauseous with vomiting), Emgality, Nurtec (rescue, ineffective) Past vitamins/Herbal/Supplements:  none Past antihistamines/decongestants:  none Other past therapies:  Trigger point injections    Family history of headaches:  No  Past Medical History: Past Medical History:  Diagnosis Date   Anginal pain ([place])    r/t anxiety   Anxiety    Asthma    no inhaler - seasonal   Constipation    Depression    Dysrhythmia    Palpatations - r/t anxiety   Genital warts    GERD (gastroesophageal reflux disease)    H/O hiatal hernia    surgery to repair   Headache(784.0)    Heart murmur    History of low transverse cesarean section 02/12/2018   MRSA cellulitis    greater than 10 yrs ago dx at urgent care    Sickle cell trait (HCC)     Medications: Outpatient Encounter Medications as of 07/14/2020  Medication Sig   albuterol (VENTOLIN HFA) 108 (90 Base) MCG/ACT inhaler TAKE 2 PUFFS BY MOUTH 4 TIMES A DAY   cetirizine (ZYRTEC) 10 MG tablet Take 10 mg by mouth daily.   cyclobenzaprine (FLEXERIL) 5 MG tablet Take 5-10 mg by mouth at bedtime as needed. (Patient not taking: Reported on 04/10/2020)   famotidine (PEPCID) 20 MG tablet Take 20 mg by mouth 2 (two) times daily. (Patient not taking: Reported on 04/10/2020)   Ferrous Sulfate (IRON PO) Take 1 tablet by mouth daily. (Patient not taking: Reported on 04/10/2020)   gabapentin (NEURONTIN) 300 MG capsule TAKE 1 CAPSULE BY MOUTH AT BEDTIME   Galcanezumab-gnlm (EMGALITY) 120 MG/ML SOAJ Inject 120 mg into the skin every 28 (twenty-eight) days. (Patient not taking: No sig reported)   linaclotide (LINZESS) 145 MCG CAPS capsule Take by mouth. (Patient not taking: Reported on 04/10/2020)   nystatin-triamcinolone ointment (MYCOLOG) Apply 1 application topically 2 (two) times daily. (Patient not taking: Reported on 04/10/2020)   omeprazole (PRILOSEC) 20 MG capsule Take 20 mg by mouth 2 (two) times daily.   ondansetron (ZOFRAN ODT) 4 MG disintegrating tablet Take 1 tablet (4 mg total) by mouth every 8 (eight) hours as needed for nausea or vomiting. (Patient not taking: Reported on 04/10/2020)   Probiotic Product (PROBIOTIC PO) Take 1 tablet by mouth  daily. (Patient not taking: Reported on 04/10/2020)   promethazine-dextromethorphan (PROMETHAZINE-DM) 6.25-15 MG/5ML syrup Take 5 mLs by mouth every 6 (six) hours. (Patient not taking: Reported on 04/10/2020)   Rimegepant Sulfate (NURTEC) 75 MG TBDP Take 1 tablet by mouth daily as needed (Maximum 1 tablet in 24 hours). (Patient not taking: Reported on 04/10/2020)   SUMAtriptan ([name]) 20 MG/ACT nasal spray 1 spray in nostril.  May repeat x1 in 2 hours if headache persists or recurs.   terbinafine (LAMISIL) 250 MG tablet Take 1 tablet (250 mg total) by mouth daily. (Patient not taking: Reported on 04/10/2020)   venlafaxine XR (EFFEXOR-XR) 75 MG 24 hr capsule Take 1 capsule (75 mg total) by mouth daily. (Patient not taking: No sig reported)   VITAMIN D PO Take by mouth. Takes 1 pill a week (Patient not taking: Reported on 04/10/2020)   Vitamin D, Ergocalciferol, (DRISDOL) 1.25 MG (50000 UNIT) CAPS capsule Take 50,000 Units by mouth once a week. (Patient not taking: Reported on 04/10/2020)   No facility-administered encounter medications on file as of 07/14/2020.    Allergies: Allergies  Allergen Reactions   Aimovig [Erenumab-Aooe]     Hot/lightheaded/nausea and vomiting   Doxycycline Hyclate Hives, Itching and Swelling   Ketorolac Tromethamine Other (See Comments)    Patient stated that it makes her extremely hyper   Amoxicillin Hives, Swelling and Rash    Has patient  had a PCN reaction causing immediate rash, facial/tongue/throat swelling, SOB or lightheadedness with hypotension: Unsure Has patient had a PCN reaction causing severe rash involving mucus membranes or skin necrosis: Unsure Has patient had a PCN reaction that required hospitalization No Has patient had a PCN reaction occurring within the last 10 years: No If all of the above answers are "NO", then may proceed with Cephalosporin use.REACTION: facial swelling and rash.    Family History: Family History  Problem Relation Age of  Onset   Hypertension Mother    Depression Mother    Depression Father    Heart disease Maternal Grandmother    Breast cancer Maternal Aunt        Great Aunt   Cancer Maternal Aunt    Diabetes Maternal Grandfather    Heart disease Maternal Grandfather    Hypertension Maternal Grandfather    Congestive Heart Failure Paternal Grandfather    Anesthesia problems Neg Hx     Observations/Objective:   There were no vitals filed for this visit. No acute distress.  Alert and oriented.  Speech fluent and not dysarthric.  Language intact.     Follow Up Instructions:    -I discussed the assessment and treatment plan with the patient. The patient was provided an opportunity to ask questions and all were answered. The patient agreed with the plan and demonstrated an understanding of the instructions.   The patient was advised to call back or seek an in-person evaluation if the symptoms worsen or if the condition fails to improve as anticipated.   Dudley Major, DO

## 2020-07-14 ENCOUNTER — Other Ambulatory Visit: Payer: Self-pay

## 2020-07-14 ENCOUNTER — Telehealth (INDEPENDENT_AMBULATORY_CARE_PROVIDER_SITE_OTHER): Payer: Medicaid Other | Admitting: Neurology

## 2020-07-14 ENCOUNTER — Encounter: Payer: Self-pay | Admitting: Neurology

## 2020-07-14 DIAGNOSIS — G43709 Chronic migraine without aura, not intractable, without status migrainosus: Secondary | ICD-10-CM

## 2020-07-14 MED ORDER — QULIPTA 60 MG PO TABS: 60.0000 mg | ORAL_TABLET | Freq: Every day | ORAL | 5 refills | Status: DC

## 2020-07-14 MED ORDER — SUMATRIPTAN 20 MG/ACT NA SOLN
NASAL | 3 refills | Status: DC
Start: 2020-07-14 — End: 2021-06-04

## 2020-07-14 NOTE — Patient Instructions (Signed)
Start Qulipta 60mg  daily.  Sent to specialty pharmacy [facility].  Expect a text message to confirm.  If you do not hear from them today, then contact our office tomorrow May take sumatriptan nasal spray or Midol but Limit use of a pain reliever to no more than 2 days out of week to prevent risk of rebound or medication-overuse headache. Keep headache diary Follow up 6 months.

## 2020-07-20 ENCOUNTER — Telehealth: Payer: Self-pay | Admitting: Neurology

## 2020-07-20 NOTE — Telephone Encounter (Signed)
Alex from [employer] called to check the status of a prior authorization for Qlipta.

## 2020-07-30 NOTE — Telephone Encounter (Signed)
PA sent to [facility]

## 2020-08-13 ENCOUNTER — Other Ambulatory Visit: Payer: Self-pay

## 2020-08-13 ENCOUNTER — Ambulatory Visit (HOSPITAL_COMMUNITY)
Admission: EM | Admit: 2020-08-13 | Discharge: 2020-08-13 | Disposition: A | Discharge status: Home / Self Care | Payer: Medicaid Other | Attending: Family Medicine | Admitting: Family Medicine

## 2020-08-13 ENCOUNTER — Encounter (HOSPITAL_COMMUNITY): Payer: Self-pay

## 2020-08-13 DIAGNOSIS — M79675 Pain in left toe(s): Secondary | ICD-10-CM

## 2020-08-13 DIAGNOSIS — S90452A Superficial foreign body, left great toe, initial encounter: Secondary | ICD-10-CM | POA: Diagnosis not present

## 2020-08-13 MED ORDER — LIDOCAINE HCL (PF) 1 % IJ SOLN
INTRAMUSCULAR | Status: AC
  Filled 2020-08-13: qty 30

## 2020-08-13 MED ORDER — SULFAMETHOXAZOLE-TRIMETHOPRIM 800-160 MG PO TABS: 1.0000 | ORAL_TABLET | Freq: Two times a day (BID) | ORAL | 0 refills | Status: AC

## 2020-08-13 NOTE — ED Provider Notes (Signed)
[facility]   [id number] 08/13/20 Arrival Time: 1618  ASSESSMENT & PLAN:  1. Foreign body of skin of left great toe     Foreign Body Removal Procedure Note  Anesthesia: digital block of L great toe performed with 1% lidocaine after cleaning skin with betadine  Procedure Details  The procedure, risks and complications have been discussed in detail (including, but not limited to pain and bleeding) with the patient.  The skin over plantar L great toe prepped and draped in the usual fashion. After adequate local anesthesia, a 2-3 mm incision made over glass puncture wound; scraped with forceps feeling 1-2 mm piece of glass; able to grasp and remove. Bandage applied. EBL: minimal Drains: none Packing: n/a Condition: Tolerated procedure well Complications: none.  If she sees signs of infection will begin: Meds ordered this encounter  Medications   sulfamethoxazole-trimethoprim (BACTRIM DS) 800-160 MG tablet    Sig: Take 1 tablet by mouth 2 (two) times daily for 7 days.    Dispense:  14 tablet    Refill:  0   Wound care discussed. See AVS for d/c information. WBAT.  Reviewed expectations re: course of current medical issues. Questions answered. Outlined signs and symptoms indicating need for more acute intervention. Patient verbalized understanding. After Visit Summary given.   SUBJECTIVE:  KALIANNA VERBEKE is a 40 y.o. female who presents with a possible foreign body of L great toe; stepped on small piece of glass yest evening. No bleeding. Painful with weight bearing.  No tx PTA. No extremity sensation changes or weakness.   OBJECTIVE:  Vitals:   08/13/20 1723  BP: 114/76  Pulse: 78  Resp: 20  SpO2: 100%    General appearance: alert; no distress L great toe: closed puncture wound visible; tender to touch; no active drainage or bleeding Psychological: alert and cooperative; normal mood and affect  Allergies  Allergen Reactions   Aimovig  [Erenumab-Aooe]     Hot/lightheaded/nausea and vomiting   Doxycycline Hyclate Hives, Itching and Swelling   Ketorolac Tromethamine Other (See Comments)    Patient stated that it makes her extremely hyper   Amoxicillin Hives, Swelling and Rash    Has patient had a PCN reaction causing immediate rash, facial/tongue/throat swelling, SOB or lightheadedness with hypotension: Unsure Has patient had a PCN reaction causing severe rash involving mucus membranes or skin necrosis: Unsure Has patient had a PCN reaction that required hospitalization No Has patient had a PCN reaction occurring within the last 10 years: No If all of the above answers are "NO", then may proceed with Cephalosporin use.REACTION: facial swelling and rash.    Past Medical History:  Diagnosis Date   Anginal pain ([place])    r/t anxiety   Anxiety    Asthma    no inhaler - seasonal   Constipation    Depression    Dysrhythmia    Palpatations - r/t anxiety   Genital warts    GERD (gastroesophageal reflux disease)    H/O hiatal hernia    surgery to repair   Headache(784.0)    Heart murmur    History of low transverse cesarean section 02/12/2018   MRSA cellulitis    greater than 10 yrs ago dx at urgent care    Sickle cell trait ([place])    Social History   Socioeconomic History   Marital status: Significant Other    Spouse name: Not on file   Number of children: 1   Years of education: 12+  Highest education level: Not on file  Occupational History   Not on file  Tobacco Use   Smoking status: Never   Smokeless tobacco: Never  Vaping Use   Vaping Use: Never used  Substance and Sexual Activity   Alcohol use: No    Alcohol/week: 0.0 standard drinks   Drug use: No   Sexual activity: Yes    Partners: Male    Birth control/protection: Condom  Other Topics Concern   Not on file  Social History Narrative   Regular exercise-no   Caffeine Use-yes   Right handed   Two story house   Social Determinants of Health    Financial Resource Strain: Not on file  Food Insecurity: Not on file  Transportation Needs: Not on file  Physical Activity: Not on file  Stress: Not on file  Social Connections: Not on file   Family History  Problem Relation Age of Onset   Hypertension Mother    Depression Mother    Depression Father    Heart disease Maternal Grandmother    Breast cancer Maternal Aunt        Great Aunt   Cancer Maternal Aunt    Diabetes Maternal Grandfather    Heart disease Maternal Grandfather    Hypertension Maternal Grandfather    Congestive Heart Failure Paternal Grandfather    Anesthesia problems Neg Hx    Past Surgical History:  Procedure Laterality Date   CERVICAL CONE BIOPSY     CESAREAN SECTION  01/20/2012   Procedure: CESAREAN SECTION;  Surgeon: Melina Schools, MD;  Location: [facility];  Service: Obstetrics;  Laterality: N/A;   CESAREAN SECTION WITH BILATERAL TUBAL LIGATION N/A 02/13/2018   Procedure: CESAREAN SECTION WITH BILATERAL TUBAL LIGATION;  Surgeon: Janyth Contes, MD;  Location: [facility];  Service: Obstetrics;  Laterality: N/A;  Nira Conn, RNFA  Needs [name]   COLONOSCOPY     DILATION AND EVACUATION N/A 03/30/2017   Procedure: DILATATION AND EVACUATION WITH ULTRASOUND GUIDANCE;  Surgeon: Janyth Contes, MD;  Location: [facility];  Service: Gynecology;  Laterality: N/A;   HERNIA REPAIR     INSERTION OF MESH N/A 05/15/2015   Procedure: INSERTION OF MESH;  Surgeon: Ralene Ok, MD;  Location: [facility];  Service: General;  Laterality: N/A;   MARSUPIALIZATION URETHRAL DIVERTICULUM     UMBILICAL HERNIA REPAIR N/A 05/15/2015   Procedure: LAPAROSCOPIC UMBILICAL HERNIA;  Surgeon: Ralene Ok, MD;  Location: [facility];  Service: General;  Laterality: N/A;   UPPER GI ENDOSCOPY     WISDOM TOOTH EXTRACTION              Vanessa Kick, MD 08/13/20 1825

## 2020-08-13 NOTE — ED Triage Notes (Signed)
Pt in with c/o glass in left big toe that occurred last nigth when she stepped on broken glass  States she is unable to put pressure on foot

## 2020-08-17 NOTE — Progress Notes (Unsigned)
Sent authorization to medicaid, denied. Sent denial to [facility]. Also sent clinicals, sent to scan. Patient called to contact qulipta [phone].

## 2020-08-18 ENCOUNTER — Telehealth: Payer: Self-pay | Admitting: Neurology

## 2020-08-18 NOTE — Telephone Encounter (Signed)
Spoke to the pt, Advised pt she may stop by and pick up samples.

## 2020-08-18 NOTE — Telephone Encounter (Signed)
Pt called in wanting to speak with sheena regarding the issues with her insurance. I tried to get more information but she hung up on me. Pt said [facility]pen was on the other line stating they have been calling us to get info. Please advise

## 2020-10-09 ENCOUNTER — Other Ambulatory Visit: Payer: Self-pay

## 2020-10-09 ENCOUNTER — Encounter (HOSPITAL_COMMUNITY): Payer: Self-pay

## 2020-10-09 ENCOUNTER — Emergency Department (HOSPITAL_COMMUNITY)
Admission: EM | Admit: 2020-10-09 | Discharge: 2020-10-10 | Disposition: A | Discharge status: Home / Self Care | Payer: Medicaid Other | Attending: Emergency Medicine | Admitting: Emergency Medicine

## 2020-10-09 DIAGNOSIS — N939 Abnormal uterine and vaginal bleeding, unspecified: Secondary | ICD-10-CM | POA: Insufficient documentation

## 2020-10-09 DIAGNOSIS — J45909 Unspecified asthma, uncomplicated: Secondary | ICD-10-CM | POA: Diagnosis not present

## 2020-10-09 DIAGNOSIS — D649 Anemia, unspecified: Secondary | ICD-10-CM | POA: Diagnosis not present

## 2020-10-09 LAB — CBC WITH DIFFERENTIAL/PLATELET
Abs Immature Granulocytes: 0.02 10*3/uL (ref 0.00–0.07)
Basophils Absolute: 0 10*3/uL (ref 0.0–0.1)
Basophils Relative: 1 %
Eosinophils Absolute: 0.2 10*3/uL (ref 0.0–0.5)
Eosinophils Relative: 3 %
HCT: 36 % (ref 36.0–46.0)
Hemoglobin: 11.7 g/dL — ABNORMAL LOW (ref 12.0–15.0)
Immature Granulocytes: 0 %
Lymphocytes Relative: 16 %
Lymphs Abs: 1.2 10*3/uL (ref 0.7–4.0)
MCH: 25.6 pg — ABNORMAL LOW (ref 26.0–34.0)
MCHC: 32.5 g/dL (ref 30.0–36.0)
MCV: 78.8 fL — ABNORMAL LOW (ref 80.0–100.0)
Monocytes Absolute: 0.5 10*3/uL (ref 0.1–1.0)
Monocytes Relative: 6 %
Neutro Abs: 5.6 10*3/uL (ref 1.7–7.7)
Neutrophils Relative %: 74 %
Platelets: 181 10*3/uL (ref 150–400)
RBC: 4.57 MIL/uL (ref 3.87–5.11)
RDW: 15.9 % — ABNORMAL HIGH (ref 11.5–15.5)
WBC: 7.5 10*3/uL (ref 4.0–10.5)
nRBC: 0 % (ref 0.0–0.2)

## 2020-10-09 LAB — URINALYSIS, ROUTINE W REFLEX MICROSCOPIC
Bilirubin Urine: NEGATIVE
Glucose, UA: NEGATIVE mg/dL
Ketones, ur: NEGATIVE mg/dL
Leukocytes,Ua: NEGATIVE
Nitrite: NEGATIVE
Protein, ur: 30 mg/dL — AB
Specific Gravity, Urine: 1.013 (ref 1.005–1.030)
pH: 6 (ref 5.0–8.0)

## 2020-10-09 LAB — COMPREHENSIVE METABOLIC PANEL
ALT: 13 U/L (ref 0–44)
AST: 16 U/L (ref 15–41)
Albumin: 3.5 g/dL (ref 3.5–5.0)
Alkaline Phosphatase: 54 U/L (ref 38–126)
Anion gap: 10 (ref 5–15)
BUN: 7 mg/dL (ref 6–20)
CO2: 26 mmol/L (ref 22–32)
Calcium: 9 mg/dL (ref 8.9–10.3)
Chloride: 104 mmol/L (ref 98–111)
Creatinine, Ser: 0.72 mg/dL (ref 0.44–1.00)
GFR, Estimated: >60 mL/min (ref >60)
Glucose, Bld: 84 mg/dL (ref 70–99)
Potassium: 4.1 mmol/L (ref 3.5–5.1)
Sodium: 140 mmol/L (ref 135–145)
Total Bilirubin: 0.7 mg/dL (ref 0.3–1.2)
Total Protein: 6.7 g/dL (ref 6.5–8.1)

## 2020-10-09 LAB — I-STAT BETA HCG BLOOD, ED (MC, WL, AP ONLY): I-stat hCG, quantitative: <5 m[IU]/mL (ref <5)

## 2020-10-09 LAB — LIPASE, BLOOD: Lipase: 33 U/L (ref 11–51)

## 2020-10-09 NOTE — ED Triage Notes (Addendum)
Pt reports vaginal bleeding since 9/3, pt also reports lower abd pain. States the bleeding is spotting, stopped for the past 2 days but came back today.

## 2020-10-09 NOTE — ED Provider Notes (Signed)
Emergency Medicine Provider Triage Evaluation Note  Amanda [name] , a 40 y.o. female  was evaluated in triage.  Pt complains of vaginal bleeding since 10/02/2020..  Associated with suprapubic cramping.  No recent sexual partners, no dysuria.  History of anemia, does not take iron supplements.  Status post tubal ligation.  Review of Systems  Positive: Vaginal bleeding, abdominal cramping Negative: Discharge, vomiting, diarrhea  Physical Exam  BP 107/64   Pulse 84   Temp 98 F (36.7 C)   Resp 15   SpO2 100%  Gen:   Awake, no distress   Resp:  Normal effort  MSK:   Moves extremities without difficulty  Other:  Suprapubic tenderness  Medical Decision Making  Medically screening exam initiated at 1:27 PM.  Appropriate orders placed.  [facility] was informed that the remainder of the evaluation will be completed by another provider, this initial triage assessment does not replace that evaluation, and the importance of remaining in the ED until their evaluation is complete.  Will check basic blood counts.  Pelvic exam deferred.   Sherrill Raring, PA-C 10/09/20 1330    Valarie Merino, MD 10/10/20 1747

## 2020-10-10 ENCOUNTER — Encounter (HOSPITAL_COMMUNITY): Payer: Self-pay | Admitting: Student

## 2020-10-10 ENCOUNTER — Emergency Department (HOSPITAL_COMMUNITY): Payer: Medicaid Other

## 2020-10-10 LAB — WET PREP, GENITAL
Sperm: NONE SEEN
Trich, Wet Prep: NONE SEEN
WBC, Wet Prep HPF POC: NONE SEEN
Yeast Wet Prep HPF POC: NONE SEEN

## 2020-10-10 MED ORDER — FENTANYL CITRATE PF 50 MCG/ML IJ SOSY
50.0000 ug | PREFILLED_SYRINGE | Freq: Once | INTRAMUSCULAR | Status: AC
Start: 2020-10-10 — End: 2020-10-10
  Administered 2020-10-10: 50 ug via INTRAVENOUS
  Filled 2020-10-10: qty 1

## 2020-10-10 MED ORDER — NAPROXEN 500 MG PO TABS: 500.0000 mg | ORAL_TABLET | Freq: Two times a day (BID) | ORAL | 0 refills | Status: DC | PRN

## 2020-10-10 MED ORDER — SODIUM CHLORIDE 0.9 % IV BOLUS
1000.0000 mL | Freq: Once | INTRAVENOUS | Status: AC
Start: 2020-10-10 — End: 2020-10-10
  Administered 2020-10-10: 1000 mL via INTRAVENOUS

## 2020-10-10 MED ORDER — METRONIDAZOLE 500 MG PO TABS: 500.0000 mg | ORAL_TABLET | Freq: Two times a day (BID) | ORAL | 0 refills | Status: DC

## 2020-10-10 MED ORDER — ONDANSETRON 4 MG PO TBDP: 4.0000 mg | ORAL_TABLET | Freq: Three times a day (TID) | ORAL | 0 refills | Status: DC | PRN

## 2020-10-10 MED ORDER — ONDANSETRON HCL 4 MG/2ML IJ SOLN
4.0000 mg | Freq: Once | INTRAMUSCULAR | Status: AC
Start: 2020-10-10 — End: 2020-10-10
  Administered 2020-10-10: 4 mg via INTRAVENOUS
  Filled 2020-10-10: qty 2

## 2020-10-10 NOTE — ED Notes (Signed)
To untrasound

## 2020-10-10 NOTE — ED Notes (Signed)
Iv not started before  the pt went to [place]

## 2020-10-10 NOTE — ED Provider Notes (Signed)
[facility] EMERGENCY DEPARTMENT Provider Note   CSN: [id number] Arrival date & time: 10/09/20  1218     History Chief Complaint  Patient presents with   Vaginal Bleeding    Amanda [name] is a 40 y.o. female with a hx of anxiety, depression, GERD, asthma, insomnia, constipation, hyperlipidemia, and prior tubal ligation who presents to the emergency department with complaints of vaginal bleeding since 09/26/20.  Patient states she is having waxing and waning vaginal bleeding, no significant heavy bleeding, only requiring 1 pad per day, however this is prolonged compared to prior periods.  States that sometimes it is bleeding and sometimes it is more of a brown discharge.  Over the past few days she is to develop some suprapubic abdominal pain that is waxing and waning as well without significant alleviating or aggravating factors.  Has had some nausea without vomiting.  Currently sexually active in a monogamous relationship.  Denies fever, chills, emesis, dysuria,  HPI     Past Medical History:  Diagnosis Date   Anginal pain ([place])    r/t anxiety   Anxiety    Asthma    no inhaler - seasonal   Constipation    Depression    Dysrhythmia    Palpatations - r/t anxiety   Genital warts    GERD (gastroesophageal reflux disease)    H/O hiatal hernia    surgery to repair   Headache(784.0)    Heart murmur    History of low transverse cesarean section 02/12/2018   MRSA cellulitis    greater than 10 yrs ago dx at urgent care    Sickle cell trait [facility])     Patient Active Problem List   Diagnosis Date Noted   Screening for cardiovascular condition 12/07/2018   Mixed hyperlipidemia 12/07/2018   Benign gestational thrombocytopenia ([place]) 11/06/2018   Status post repeat low transverse cesarean section 02/13/2018   History of low transverse cesarean section 02/12/2018   Decreased fetal movement 02/07/2018   Migraine 09/03/2017   Maternal age 74+, multigravida,  antepartum 07/24/2017   Maternal obesity affecting pregnancy, antepartum 07/24/2017   Constipation 07/24/2017   Sickle cell trait ([place]) 07/24/2017   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 pylori infection 11/02/2015   Intractable chronic migraine without aura and without status migrainosus 03/19/2015   Morbid obesity ([place]) 03/19/2015   Murmur 06/20/2012   INSOMNIA 08/14/2009   DYSMENORRHEA 12/09/2008   FATIGUE 03/25/2008   DEPRESSION/ANXIETY 09/11/2007   HEADACHE 09/11/2007   PALPITATIONS 02/23/2007   Atypical chest pain 02/23/2007   ALLERGIC RHINITIS 10/06/2006   ASTHMA 10/06/2006   GERD 10/06/2006   Asthma 10/06/2006    Past Surgical History:  Procedure Laterality Date   CERVICAL CONE BIOPSY     CESAREAN SECTION  01/20/2012   Procedure: CESAREAN SECTION;  Surgeon: Melina Schools, MD;  Location: [facility];  Service: Obstetrics;  Laterality: N/A;   CESAREAN SECTION WITH BILATERAL TUBAL LIGATION N/A 02/13/2018   Procedure: CESAREAN SECTION WITH BILATERAL TUBAL LIGATION;  Surgeon: Janyth Contes, MD;  Location: [facility];  Service: Obstetrics;  Laterality: N/A;  Nira Conn, RNFA  Needs [name]   COLONOSCOPY     DILATION AND EVACUATION N/A 03/30/2017   Procedure: DILATATION AND EVACUATION WITH ULTRASOUND  GUIDANCE;  Surgeon: Janyth Contes, MD;  Location: [facility];  Service: Gynecology;  Laterality: N/A;   HERNIA REPAIR     INSERTION OF MESH N/A 05/15/2015   Procedure: INSERTION OF MESH;  Surgeon: Ralene Ok, MD;  Location: [facility];  Service: General;  Laterality: N/A;   MARSUPIALIZATION URETHRAL DIVERTICULUM     UMBILICAL HERNIA REPAIR N/A 05/15/2015   Procedure: LAPAROSCOPIC UMBILICAL HERNIA;  Surgeon: Ralene Ok,  MD;  Location: [facility];  Service: General;  Laterality: N/A;   UPPER GI ENDOSCOPY     WISDOM TOOTH EXTRACTION       OB History     Gravida  5   Para  2   Term  2   Preterm  0   AB  3   Living  2      SAB  2   IAB  1   Ectopic  0   Multiple  0   Live Births  2           Family History  Problem Relation Age of Onset   Hypertension Mother    Depression Mother    Depression Father    Heart disease Maternal Grandmother    Breast cancer Maternal Aunt        Great Aunt   Cancer Maternal Aunt    Diabetes Maternal Grandfather    Heart disease Maternal Grandfather    Hypertension Maternal Grandfather    Congestive Heart Failure Paternal Grandfather    Anesthesia problems Neg Hx     Social History   Tobacco Use   Smoking status: Never   Smokeless tobacco: Never  Vaping Use   Vaping Use: Never used  Substance Use Topics   Alcohol use: No    Alcohol/week: 0.0 standard drinks   Drug use: No    Home Medications Prior to Admission medications   Medication Sig Start Date End Date Taking? Authorizing Provider  albuterol (VENTOLIN HFA) 108 (90 Base) MCG/ACT inhaler TAKE 2 PUFFS BY MOUTH 4 TIMES A DAY 10/18/18   [provider]  Atogepant (QULIPTA) 60 MG TABS Take 60 mg by mouth daily. 07/14/20   Pieter Partridge, DO  cetirizine (ZYRTEC) 10 MG tablet Take 10 mg by mouth daily. 10/18/18   [provider]  cyclobenzaprine (FLEXERIL) 5 MG tablet Take 5-10 mg by mouth at bedtime as needed. Patient not taking: Reported on 04/10/2020 12/28/18   [provider]  famotidine (PEPCID) 20 MG tablet Take 20 mg by mouth 2 (two) times daily. Patient not taking: Reported on 04/10/2020 10/18/18   [provider]  Ferrous Sulfate (IRON PO) Take 1 tablet by mouth daily. Patient not taking: Reported on 04/10/2020    [provider]  gabapentin (NEURONTIN) 300 MG capsule TAKE 1 CAPSULE BY MOUTH AT BEDTIME 12/28/18   [provider]   Galcanezumab-gnlm (EMGALITY) 120 MG/ML SOAJ Inject 120 mg into the skin every 28 (twenty-eight) days. Patient not taking: No sig reported 03/15/19   Pieter Partridge, DO  linaclotide (LINZESS) 145 MCG CAPS capsule Take by mouth. Patient not taking: Reported on 04/10/2020 02/18/19   [provider]  nystatin-triamcinolone ointment (MYCOLOG) Apply 1 application topically 2 (two) times daily. Patient not taking: Reported on 04/10/2020 11/06/18   Landis Martins, DPM  omeprazole (PRILOSEC) 20 MG capsule Take 20 mg by mouth 2 (two) times daily. 04/06/19   [provider]  ondansetron (ZOFRAN ODT) 4 MG disintegrating tablet Take 1 tablet (4 mg total) by  mouth every 8 (eight) hours as needed for nausea or vomiting. Patient not taking: Reported on 04/10/2020 04/17/19   Recardo Evangelist, PA-C  Probiotic Product (PROBIOTIC PO) Take 1 tablet by mouth daily. Patient not taking: Reported on 04/10/2020    [provider]  promethazine-dextromethorphan (PROMETHAZINE-DM) 6.25-15 MG/5ML syrup Take 5 mLs by mouth every 6 (six) hours. Patient not taking: Reported on 04/10/2020 06/04/19   [provider]  SUMAtriptan ([name]) 20 MG/ACT nasal spray 1 spray in nostril.  May repeat x1 in 2 hours if headache persists or recurs. 07/14/20   Pieter Partridge, DO  terbinafine (LAMISIL) 250 MG tablet Take 1 tablet (250 mg total) by mouth daily. Patient not taking: Reported on 04/10/2020 11/08/18   Landis Martins, DPM  venlafaxine XR (EFFEXOR-XR) 75 MG 24 hr capsule Take 1 capsule (75 mg total) by mouth daily. Patient not taking: No sig reported 01/07/19   Metta Clines R, DO  VITAMIN D PO Take by mouth. Takes 1 pill a week Patient not taking: Reported on 04/10/2020    [provider]  Vitamin D, Ergocalciferol, (DRISDOL) 1.25 MG (50000 UNIT) CAPS capsule Take 50,000 Units by mouth once a week. Patient not taking: Reported on 04/10/2020 03/28/19   [provider]    Allergies     Aimovig [erenumab-aooe], Doxycycline hyclate, Ketorolac tromethamine, and Amoxicillin  Review of Systems   Review of Systems  Constitutional:  Negative for chills and fever.  Respiratory:  Negative for shortness of breath.   Cardiovascular:  Negative for chest pain.  Gastrointestinal:  Positive for abdominal pain and nausea. Negative for diarrhea and vomiting.  Genitourinary:  Positive for vaginal bleeding. Negative for dysuria.  Neurological:  Negative for syncope.  All other systems reviewed and are negative.  Physical Exam Updated Vital Signs BP 117/67 (BP Location: Right Arm)   Pulse 64   Temp 98.7 F (37.1 C)   Resp 18   LMP 08/30/2020 (Exact Date)   SpO2 100%   Physical Exam Vitals and nursing note reviewed.  Constitutional:      General: She is not in acute distress.    Appearance: She is well-developed. She is not toxic-appearing.  HENT:     Head: Normocephalic and atraumatic.  Eyes:     General:        Right eye: No discharge.        Left eye: No discharge.     Conjunctiva/sclera: Conjunctivae normal.  Cardiovascular:     Rate and Rhythm: Normal rate and regular rhythm.  Pulmonary:     Effort: Pulmonary effort is normal. No respiratory distress.     Breath sounds: Normal breath sounds. No wheezing, rhonchi or rales.  Abdominal:     General: There is no distension.     Palpations: Abdomen is soft.     Tenderness: There is no abdominal tenderness. There is no guarding or rebound.  Genitourinary:    Comments: Jeri NT present as chaperone.  No external lesions.  Mild amount of blood present in the vaginal canal.  No cervical friability.  Mild diffuse tenderness with bimanual exam.  No specific cervical motion tenderness.  No palpable masses. Musculoskeletal:     Cervical back: Neck supple.  Skin:    General: Skin is warm and dry.     Findings: No rash.  Neurological:     Mental Status: She is alert.     Comments: Clear speech.   Psychiatric:  Behavior: Behavior normal.    ED Results / Procedures / Treatments   Labs (all labs ordered are listed, but only abnormal results are displayed) Labs Reviewed  CBC WITH DIFFERENTIAL/PLATELET - Abnormal; Notable for the following components:      Result Value   Hemoglobin 11.7 (*)    MCV 78.8 (*)    MCH 25.6 (*)    RDW 15.9 (*)    All other components within normal limits  URINALYSIS, ROUTINE W REFLEX MICROSCOPIC - Abnormal; Notable for the following components:   APPearance HAZY (*)    Hgb urine dipstick LARGE (*)    Protein, ur 30 (*)    Bacteria, UA RARE (*)    All other components within normal limits  COMPREHENSIVE METABOLIC PANEL  LIPASE, BLOOD  I-STAT BETA HCG BLOOD, ED ([facility], [facility], [facility] ONLY)    EKG None  Radiology US Transvaginal Non-OB  Result Date: 10/10/2020 CLINICAL DATA:  Pelvic pain x3 days with vaginal bleeding. EXAM: TRANSABDOMINAL ULTRASOUND OF PELVIS DOPPLER ULTRASOUND OF OVARIES TECHNIQUE: Transabdominal ultrasound examination of the pelvis was performed including evaluation of the uterus, ovaries, adnexal regions, and pelvic cul-de-sac. Color and duplex Doppler ultrasound was utilized to evaluate blood flow to the ovaries. COMPARISON:  None. FINDINGS: Uterus Measurements: 9.9 cm x 4.8 cm x 5.7 cm = volume: 139.9 mL. No fibroids or other mass visualized. Endometrium Thickness: 5.5 mm.  No focal abnormality visualized. Right ovary The right ovary is not visualized. Left ovary Measurements: 2.5 cm x 1.8 cm x 2.1 cm = volume: 4.9 mL. Normal appearance/no adnexal mass. Pulsed Doppler evaluation demonstrates normal low-resistance arterial and venous waveforms in the LEFT ovary. Other: No pelvic free fluid is seen. IMPRESSION: 1. Nonvisualization of the right ovary. 2. Otherwise, unremarkable pelvic ultrasound. Electronically Signed   By: Virgina Norfolk M.D.   On: 10/10/2020 03:06   US Pelvis Complete  Result Date: 10/10/2020 CLINICAL DATA:  Pelvic pain x3 days with  vaginal bleeding. EXAM: TRANSABDOMINAL ULTRASOUND OF PELVIS DOPPLER ULTRASOUND OF OVARIES TECHNIQUE: Transabdominal ultrasound examination of the pelvis was performed including evaluation of the uterus, ovaries, adnexal regions, and pelvic cul-de-sac. Color and duplex Doppler ultrasound was utilized to evaluate blood flow to the ovaries. COMPARISON:  None. FINDINGS: Uterus Measurements: 9.9 cm x 4.8 cm x 5.7 cm = volume: 139.9 mL. No fibroids or other mass visualized. Endometrium Thickness: 5.5 mm.  No focal abnormality visualized. Right ovary The right ovary is not visualized. Left ovary Measurements: 2.5 cm x 1.8 cm x 2.1 cm = volume: 4.9 mL. Normal appearance/no adnexal mass. Pulsed Doppler evaluation demonstrates normal low-resistance arterial and venous waveforms in the LEFT ovary. Other: No pelvic free fluid is seen. IMPRESSION: 1. Nonvisualization of the right ovary. 2. Otherwise, unremarkable pelvic ultrasound. Electronically Signed   By: Virgina Norfolk M.D.   On: 10/10/2020 03:06   [place] Art/Ven Flow Abd Pelv Doppler  Result Date: 10/10/2020 CLINICAL DATA:  Pelvic pain and vaginal bleeding. EXAM: TRANSABDOMINAL AND TRANSVAGINAL ULTRASOUND OF PELVIS DOPPLER ULTRASOUND OF OVARIES TECHNIQUE: Both transabdominal and transvaginal ultrasound examinations of the pelvis were performed. Transabdominal technique was performed for global imaging of the pelvis including uterus, ovaries, adnexal regions, and pelvic cul-de-sac. It was necessary to proceed with endovaginal exam following the transabdominal exam to visualize the . Color and duplex Doppler ultrasound was utilized to evaluate blood flow to the ovaries. COMPARISON:  None. FINDINGS: Uterus Measurements: 9.9 cm x 4.8 cm x 5.7 cm = volume: 139.9 mL. No fibroids or  other mass visualized. Endometrium Thickness: 5.5 mm.  No focal abnormality visualized. Right ovary The right ovary is not visualized. Left ovary Measurements: 2.5 cm x 1.8 cm x 2.1 cm = volume:  4.9 mL. Normal appearance/no adnexal mass. Pulsed Doppler evaluation demonstrates normal low-resistance arterial and venous waveforms in the LEFT ovary. Other: No pelvic free fluid is seen. IMPRESSION: 1. Nonvisualization of the right ovary. 2. Otherwise, unremarkable pelvic ultrasound. Electronically Signed   By: Virgina Norfolk M.D.   On: 10/10/2020 03:07    Procedures Procedures   Medications Ordered in ED Medications - No data to display  ED Course  I have reviewed the triage vital signs and the nursing notes.  Pertinent labs & imaging results that were available during my care of the patient were reviewed by me and considered in my medical decision making (see chart for details).    MDM Rules/Calculators/A&P                           Patient presents to the ED with complaints of vaginal bleeding.  Nontoxic, resting comfortably, vitals without significant abnormality. Additional history obtained:  Additional history obtained from chart review & nursing note review.   Lab Tests:  I Ordered, reviewed, and interpreted labs, which included:  CBC: Mild anemia CMP: Unremarkable Lipase: Within normal limits Urinalysis: Blood present consistent with vaginal bleeding Pregnancy test: Negative Wet prep: Clue cells  Imaging Studies ordered:  I ordered imaging studies which included pelvic ultrasound, I independently reviewed, formal radiology impression shows:  1. Nonvisualization of the right ovary. 2. Otherwise, unremarkable pelvic ultrasound.  ED Course:  Pregnancy test is negative therefore doubt ectopic pregnancy or spontaneous abortion.  Ultrasound without findings of torsion or other acute surgical process at this time, unable to visualize right ovary, pain is more diffuse though.  Hemoglobin very mildly decreased, no critical anemia requiring transfusion.  Patient monogamous, no concern for STI, low suspicion for PID.  Wet prep with clue cells, given some brown discharge we will  treat for BV.  Overall patient appears appropriate for discharge home with OB/GYN follow-up which she has scheduled for this monday. I discussed results, treatment plan, need for follow-up, and return precautions with the patient. Provided opportunity for questions, patient confirmed understanding and is in agreement with plan.   Portions of this note were generated with [employer]. Dictation errors may occur despite best attempts at proofreading.  Final Clinical Impression(s) / ED Diagnoses Final diagnoses:  Vaginal bleeding    Rx / DC Orders ED Discharge Orders          Ordered    ondansetron (ZOFRAN ODT) 4 MG disintegrating tablet  Every 8 hours PRN        10/10/20 0507    naproxen (NAPROSYN) 500 MG tablet  2 times daily PRN        10/10/20 0507    metroNIDAZOLE (FLAGYL) 500 MG tablet  2 times daily        10/10/20 0507           Hx of allergy to toradol- can tolerate other NSAIDs without difficulty per patient.    Leafy Kindle 10/10/20 0509    Quintella Reichert, MD 10/10/20 1950

## 2020-10-10 NOTE — Discharge Instructions (Addendum)
You were seen in the emergency department today for vaginal bleeding.  Your blood work did show that your hemoglobin was very mildly low compared to prior, you can start taking an over-the-counter iron supplement for this.  Your ultrasound did not show any significant abnormalities, we had trouble seeing your right ovary, however the remainder of the imaging was reassuring.  Your pelvic sample did show clue cells which indicates bacterial vaginosis, we are treating this with Flagyl, please take this twice per day for the next 1 week.  Do not drink alcohol with Flagyl as it can be very dangerous.  We are sending you home with naproxen and Zofran to help with your symptoms  - Naproxen is a nonsteroidal anti-inflammatory medication that will help with pain and swelling. Be sure to take this medication as prescribed with food, 1 pill every 12 hours,  It should be taken with food, as it can cause stomach upset, and more seriously, stomach bleeding. Do not take other nonsteroidal anti-inflammatory medications with this such as Advil, Motrin, Aleve, Mobic, Goodie Powder, or Motrin.    - Zofran: Take every 8 hours as needed for nausea/vomiting.   You make take Tylenol per over the counter dosing with these medications.   We have prescribed you new medication(s) today. Discuss the medications prescribed today with your pharmacist as they can have adverse effects and interactions with your other medicines including over the counter and prescribed medications. Seek medical evaluation if you start to experience new or abnormal symptoms after taking one of these medicines, seek care immediately if you start to experience difficulty breathing, feeling of your throat closing, facial swelling, or rash as these could be indications of a more serious allergic reaction   Please follow-up with your OB/GYN on Monday as scheduled. Return to the ER for new or worsening symptoms including but not limited to new or worsening  pain, increased bleeding, dizziness, passing out, fever, inability to keep fluids down or any other concerns.

## 2020-10-10 NOTE — ED Notes (Signed)
The pt has had vaginal bleeding since the first of the month  lmp September 1st

## 2020-10-12 LAB — GC/CHLAMYDIA PROBE AMP (~~LOC~~) NOT AT ARMC
Chlamydia: NEGATIVE
Comment: NEGATIVE
Comment: NORMAL
Neisseria Gonorrhea: NEGATIVE

## 2021-01-20 NOTE — Progress Notes (Deleted)
NEUROLOGY FOLLOW UP OFFICE NOTE  MARIELLEN [name] [id number]  Assessment/Plan:   Migraine without aura, without status migrainosus, not intractable  Migraine prevention:  Qulipta 60mg  daily Migraine rescue:  Sumatriptan NS or Midol; Zofran for nausea Limit use of pain relievers to no more than 2 days out of week to prevent risk of rebound or medication-overuse headache. Keep headache diary Follow up ***   Subjective:  Amanda [name] is a 40 year old right-handed woman with asthma, depression, anxiety and morbid obesity who follows up for chronic migraine.     UPDATE: Intensity:  moderate Duration:  30 minutes with Midol Frequency:  15 days a month Frequency of Midol:  daily Current NSAIDS:  Midol Current analgesics:  none Current triptans:  none Current ergotamine:  none Current anti-emetic:  Zofran ODT 4mg  Current muscle relaxants:  tizanidine PRN for headache Current anti-anxiolytic:  none Current sleep aide:  none Current Antihypertensive medications:  none Current Antidepressant medications:  none Current Anticonvulsant medications:  none Current anti-CGRP:  none Current Vitamins/Herbal/Supplements:  prenatal Current Antihistamines/Decongestants:  none Other therapy:  chiropractic medicine Hormone/birth control:  none   Caffeine:  no Alcohol:  no Smoker:  no Diet:  Cut out pork, cheese, chocolate.  Increased water intake Exercise:  walking Depression/stress:  stress Sleep hygiene:  poor   HISTORY: Onset:  adolescence Location:  Holocephalic from neck and shoulders to behind the eyes Quality:  Throbbing, pressure Initial intensity:  10/10 Aura:  no Prodrome:  no Associated symptoms:  Dizziness, nausea, photophobia, phonophobia Initial Duration:  constant Initial Frequency:  Daily (16-20 days severe) Triggers:  Emotional stress, getting upset, menstrual cycle Relieving factors:  none Activity:  Difficult to function when severe   Her  headaches improved during her pregnancy but returned post-partum.   Due to worsening headaches, an MRI of the brain with and without contrast was performed on 11/04/2018 and was normal.   Past NSAIDS:  Advil, Aleve Past analgesics:  BC powder, Tylenol Past abortive triptans:  Sumatriptan 100mg , sumatriptan NS, Maxalt, Relpax Past abortive ergotamine:  none Past muscle relaxants:  Flexeril 10mg  Past anti-emetic:  Zofran Past antihypertensive medications:  would not use beta blockers due to underlying asthma. Past antidepressant medications:  Nortriptyline 25mg  (side effects), Zoloft Past anticonvulsant medications:  topiramate 100mg  (ineffective) Past anti-CGRP:  Aimovig 70mg  (helped but it made her feel hot, lightheaded, nauseous with vomiting), Emgality, Nurtec (rescue, ineffective) Past vitamins/Herbal/Supplements:  none Past antihistamines/decongestants:  none Other past therapies:  Trigger point injections   Family history of headaches:  No  PAST MEDICAL HISTORY: Past Medical History:  Diagnosis Date   Anginal pain ([place])    r/t anxiety   Anxiety    Asthma    no inhaler - seasonal   Constipation    Depression    Dysrhythmia    Palpatations - r/t anxiety   Genital warts    GERD (gastroesophageal reflux disease)    H/O hiatal hernia    surgery to repair   Headache(784.0)    Heart murmur    History of low transverse cesarean section 02/12/2018   MRSA cellulitis    greater than 10 yrs ago dx at urgent care    Sickle cell trait ([place])     MEDICATIONS: Current Outpatient Medications on File Prior to Visit  Medication Sig Dispense Refill   albuterol (VENTOLIN HFA) 108 (90 Base) MCG/ACT inhaler TAKE 2 PUFFS BY MOUTH 4 TIMES A DAY     Atogepant (QULIPTA) 60 MG TABS  Take 60 mg by mouth daily. 30 tablet 5   cetirizine (ZYRTEC) 10 MG tablet Take 10 mg by mouth daily.     gabapentin (NEURONTIN) 300 MG capsule TAKE 1 CAPSULE BY MOUTH AT BEDTIME     Galcanezumab-gnlm (EMGALITY)  120 MG/ML SOAJ Inject 120 mg into the skin every 28 (twenty-eight) days. (Patient not taking: No sig reported) 1 pen 11   metroNIDAZOLE (FLAGYL) 500 MG tablet Take 1 tablet (500 mg total) by mouth 2 (two) times daily. 14 tablet 0   naproxen (NAPROSYN) 500 MG tablet Take 1 tablet (500 mg total) by mouth 2 (two) times daily as needed for moderate pain. 15 tablet 0   omeprazole (PRILOSEC) 20 MG capsule Take 20 mg by mouth 2 (two) times daily.     ondansetron (ZOFRAN ODT) 4 MG disintegrating tablet Take 1 tablet (4 mg total) by mouth every 8 (eight) hours as needed for nausea or vomiting. 5 tablet 0   SUMAtriptan ([name]) 20 MG/ACT nasal spray 1 spray in nostril.  May repeat x1 in 2 hours if headache persists or recurs. 10 each 3   No current facility-administered medications on file prior to visit.    ALLERGIES: Allergies  Allergen Reactions   Aimovig [Erenumab-Aooe]     Hot/lightheaded/nausea and vomiting   Doxycycline Hyclate Hives, Itching and Swelling   Ketorolac Tromethamine Other (See Comments)    Patient stated that it makes her extremely hyper   Amoxicillin Hives, Swelling and Rash    Has patient had a PCN reaction causing immediate rash, facial/tongue/throat swelling, SOB or lightheadedness with hypotension: Unsure Has patient had a PCN reaction causing severe rash involving mucus membranes or skin necrosis: Unsure Has patient had a PCN reaction that required hospitalization No Has patient had a PCN reaction occurring within the last 10 years: No If all of the above answers are "NO", then may proceed with Cephalosporin use.REACTION: facial swelling and rash.    FAMILY HISTORY: Family History  Problem Relation Age of Onset   Hypertension Mother    Depression Mother    Depression Father    Heart disease Maternal Grandmother    Breast cancer Maternal Aunt        Great Aunt   Cancer Maternal Aunt    Diabetes Maternal Grandfather    Heart disease Maternal Grandfather     Hypertension Maternal Grandfather    Congestive Heart Failure Paternal Grandfather    Anesthesia problems Neg Hx       Objective:  *** General: No acute distress.  Patient appears ***-groomed.   Head:  Normocephalic/atraumatic Eyes:  Fundi examined but not visualized Neck: supple, no paraspinal tenderness, full range of motion Heart:  Regular rate and rhythm Lungs:  Clear to auscultation bilaterally Back: No paraspinal tenderness Neurological Exam: alert and oriented to person, place, and time.  Speech fluent and not dysarthric, language intact.  CN II-XII intact. Bulk and tone normal, muscle strength 5/5 throughout.  Sensation to light touch intact.  Deep tendon reflexes 2+ throughout, toes downgoing.  Finger to nose testing intact.  Gait normal, Romberg negative.   Metta Clines, DO  CC: ***

## 2021-01-22 ENCOUNTER — Ambulatory Visit: Payer: Medicaid Other | Admitting: Neurology

## 2021-02-12 ENCOUNTER — Encounter (HOSPITAL_COMMUNITY): Payer: Self-pay | Admitting: Emergency Medicine

## 2021-02-12 ENCOUNTER — Ambulatory Visit (HOSPITAL_COMMUNITY)
Admission: EM | Admit: 2021-02-12 | Discharge: 2021-02-12 | Disposition: A | Discharge status: Home / Self Care | Payer: Medicaid Other | Attending: Family Medicine | Admitting: Family Medicine

## 2021-02-12 ENCOUNTER — Other Ambulatory Visit: Payer: Self-pay

## 2021-02-12 DIAGNOSIS — J302 Other seasonal allergic rhinitis: Secondary | ICD-10-CM

## 2021-02-12 DIAGNOSIS — H109 Unspecified conjunctivitis: Secondary | ICD-10-CM

## 2021-02-12 MED ORDER — TRIAMCINOLONE ACETONIDE 40 MG/ML IJ SUSP
INTRAMUSCULAR | Status: AC
  Filled 2021-02-12: qty 1

## 2021-02-12 MED ORDER — TRIAMCINOLONE ACETONIDE 40 MG/ML IJ SUSP
40.0000 mg | Freq: Once | INTRAMUSCULAR | Status: AC
  Administered 2021-02-12: 40 mg via INTRAMUSCULAR

## 2021-02-12 MED ORDER — FLUTICASONE PROPIONATE 50 MCG/ACT NA SUSP: 2.0000 | Freq: Every day | NASAL | 0 refills | Status: AC

## 2021-02-12 NOTE — ED Provider Notes (Signed)
[place]    CSN: [id number] Arrival date & time: 02/12/21  0820      History   Chief Complaint Chief Complaint  Patient presents with   Eye Problem    HPI Amanda [name] is a 41 y.o. female.    Eye Problem Here for a 1 week history of bilateral eye irritation, itching, and now drainage.  On the left eye it is mainly clear drainage.  On the right eyes she has had some green drainage.  She is having some mattering in both eyes when she awakens.  She also notes some sinus pressure.  This began after she cleaned a fan.  She states she has allergy trouble whenever she dusts and stirs up dust like that.  No fever, chills, or cough  Past Medical History:  Diagnosis Date   Anginal pain ([place])    r/t anxiety   Anxiety    Asthma    no inhaler - seasonal   Constipation    Depression    Dysrhythmia    Palpatations - r/t anxiety   Genital warts    GERD (gastroesophageal reflux disease)    H/O hiatal hernia    surgery to repair   Headache(784.0)    Heart murmur    History of low transverse cesarean section 02/12/2018   MRSA cellulitis    greater than 10 yrs ago dx at urgent care    Sickle cell trait [facility])     Patient Active Problem List   Diagnosis Date Noted   Screening for cardiovascular condition 12/07/2018   Mixed hyperlipidemia 12/07/2018   Benign gestational thrombocytopenia ([place]) 11/06/2018   Status post repeat low transverse cesarean section 02/13/2018   History of low transverse cesarean section 02/12/2018   Decreased fetal movement 02/07/2018   Migraine 09/03/2017   Maternal age 58+, multigravida, antepartum 07/24/2017   Maternal obesity affecting pregnancy, antepartum 07/24/2017   Constipation 07/24/2017   Sickle cell trait ([place]) 07/24/2017   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 pylori infection 11/02/2015   Intractable chronic migraine without aura and without status migrainosus 03/19/2015   Morbid obesity ([place]) 03/19/2015   Murmur 06/20/2012   INSOMNIA 08/14/2009   DYSMENORRHEA 12/09/2008   FATIGUE 03/25/2008   DEPRESSION/ANXIETY 09/11/2007   HEADACHE 09/11/2007   PALPITATIONS 02/23/2007   Atypical chest pain 02/23/2007   ALLERGIC RHINITIS 10/06/2006   ASTHMA 10/06/2006   GERD 10/06/2006   Asthma 10/06/2006    Past Surgical History:  Procedure Laterality Date   CERVICAL CONE BIOPSY     CESAREAN SECTION  01/20/2012   Procedure: CESAREAN SECTION;  Surgeon: Melina Schools, MD;  Location: [facility];  Service: Obstetrics;  Laterality: N/A;   CESAREAN SECTION WITH BILATERAL TUBAL LIGATION N/A 02/13/2018   Procedure: CESAREAN SECTION WITH BILATERAL TUBAL LIGATION;  Surgeon: Janyth Contes, MD;  Location: [place];  Service: Obstetrics;  Laterality: N/A;  Nira Conn, RNFA  Needs [name]   COLONOSCOPY     DILATION AND EVACUATION N/A 03/30/2017   Procedure: DILATATION AND EVACUATION WITH ULTRASOUND GUIDANCE;  Surgeon: Janyth Contes, MD;  Location: [facility];  Service: Gynecology;  Laterality: N/A;   HERNIA REPAIR     INSERTION OF MESH N/A 05/15/2015   Procedure: INSERTION OF MESH;  Surgeon: Ralene Ok, MD;  Location: [facility];  Service: General;  Laterality: N/A;   MARSUPIALIZATION URETHRAL DIVERTICULUM     UMBILICAL HERNIA REPAIR N/A 05/15/2015   Procedure: LAPAROSCOPIC UMBILICAL HERNIA;  Surgeon: Ralene Ok, MD;  Location: [facility];  Service: General;  Laterality: N/A;   UPPER GI ENDOSCOPY     WISDOM TOOTH EXTRACTION      OB History     Gravida  5   Para  2   Term  2   Preterm  0   AB  3   Living  2      SAB  2   IAB  1   Ectopic  0   Multiple  0   Live Births  2            Home Medications    Prior to Admission  medications   Medication Sig Start Date End Date Taking? Authorizing Provider  fluticasone (FLONASE) 50 MCG/ACT nasal spray Place 2 sprays into both nostrils daily. 02/12/21  Yes Barrett Henle, MD  albuterol (VENTOLIN HFA) 108 (90 Base) MCG/ACT inhaler TAKE 2 PUFFS BY MOUTH 4 TIMES A DAY 10/18/18   [provider]  Atogepant (QULIPTA) 60 MG TABS Take 60 mg by mouth daily. 07/14/20   Pieter Partridge, DO  cetirizine (ZYRTEC) 10 MG tablet Take 10 mg by mouth daily. 10/18/18   [provider]  gabapentin (NEURONTIN) 300 MG capsule TAKE 1 CAPSULE BY MOUTH AT BEDTIME 12/28/18   [provider]  omeprazole (PRILOSEC) 20 MG capsule Take 20 mg by mouth 2 (two) times daily. 04/06/19   [provider]  ondansetron (ZOFRAN ODT) 4 MG disintegrating tablet Take 1 tablet (4 mg total) by mouth every 8 (eight) hours as needed for nausea or vomiting. 10/10/20   Petrucelli, Aldona Bar R, PA-C  SUMAtriptan ([name]) 20 MG/ACT nasal spray 1 spray in nostril.  May repeat x1 in 2 hours if headache persists or recurs. 07/14/20   Pieter Partridge, DO    Family History Family History  Problem Relation Age of Onset   Hypertension Mother    Depression Mother    Depression Father    Heart disease Maternal Grandmother    Breast cancer Maternal Aunt        Great Aunt   Cancer Maternal Aunt    Diabetes Maternal Grandfather    Heart disease Maternal Grandfather    Hypertension Maternal Grandfather    Congestive Heart Failure Paternal Grandfather    Anesthesia problems Neg Hx     Social History Social History   Tobacco Use   Smoking status: Never   Smokeless tobacco: Never  Vaping Use   Vaping Use: Never used  Substance Use Topics   Alcohol use: No    Alcohol/week: 0.0 standard drinks   Drug use: No     Allergies   Aimovig [erenumab-aooe], Doxycycline hyclate, Ketorolac tromethamine, and Amoxicillin   Review of Systems Review of Systems   Physical Exam Triage Vital  Signs ED Triage Vitals  Enc Vitals Group     BP 02/12/21 0830 112/76     Pulse Rate 02/12/21 0830 79     Resp 02/12/21 0832 16     Temp 02/12/21 0830 98.1 F (36.7 C)     Temp Source 02/12/21 0830 Oral     SpO2 02/12/21 0830 99 %     Weight --      Height --      Head Circumference --  Peak Flow --      Pain Score 02/12/21 0829 5     Pain Loc --      Pain Edu? --      Excl. in [place]? --    No data found.  Updated Vital Signs BP 112/76 (BP Location: Left Arm)    Pulse 79    Temp 98.1 F (36.7 C) (Oral)    Resp 16    LMP 02/05/2021 (Approximate)    SpO2 99%    Breastfeeding No   Visual Acuity Right Eye Distance:   Left Eye Distance:   Bilateral Distance:    Right Eye Near:   Left Eye Near:    Bilateral Near:     Physical Exam Vitals reviewed.  Constitutional:      General: She is not in acute distress.    Appearance: She is not toxic-appearing.  HENT:     Right Ear: Tympanic membrane and ear canal normal.     Left Ear: Tympanic membrane and ear canal normal.     Nose: Nose normal.     Mouth/Throat:     Mouth: Mucous membranes are moist.     Pharynx: No oropharyngeal exudate or posterior oropharyngeal erythema.  Eyes:     Extraocular Movements: Extraocular movements intact.     Pupils: Pupils are equal, round, and reactive to light.     Comments: Mild injection of the right conjunctivae  Cardiovascular:     Rate and Rhythm: Normal rate and regular rhythm.     Heart sounds: No murmur heard. Pulmonary:     Effort: Pulmonary effort is normal. No respiratory distress.     Breath sounds: No wheezing, rhonchi or rales.  Chest:     Chest wall: No tenderness.  Musculoskeletal:     Cervical back: Neck supple.  Lymphadenopathy:     Cervical: No cervical adenopathy.  Skin:    Capillary Refill: Capillary refill takes less than 2 seconds.     Coloration: Skin is not jaundiced or pale.  Neurological:     General: No focal deficit present.     Mental Status: She is  alert and oriented to person, place, and time.  Psychiatric:        Behavior: Behavior normal.     UC Treatments / Results  Labs (all labs ordered are listed, but only abnormal results are displayed) Labs Reviewed - No data to display  EKG   Radiology No results found.  Procedures Procedures (including critical care time)  Medications Ordered in UC Medications  triamcinolone acetonide (KENALOG-40) injection 40 mg (has no administration in time range)    Initial Impression / Assessment and Plan / UC Course  I have reviewed the triage vital signs and the nursing notes.  Pertinent labs & imaging results that were available during my care of the patient were reviewed by me and considered in my medical decision making (see chart for details).     Will treat for poss bacterial conjunctivitis with abx drops since the right eye is red, but will send in flonase and give kenalog for poss allergic rhinitis/conjunctivitis. Final Clinical Impressions(s) / UC Diagnoses   Final diagnoses:  Conjunctivitis of both eyes, unspecified conjunctivitis type  Seasonal allergies     Discharge Instructions      Take cetirizine 10 mg, 1 daily as needed for allergies  Use Flonase no spray, 2 sprays in each side of your nose once daily for 2 to 3 weeks.  You have been given  an injection of triamcinolone today.  That is a steroid to reduce allergic reaction     ED Prescriptions     Medication Sig Dispense Auth. Provider   fluticasone (FLONASE) 50 MCG/ACT nasal spray Place 2 sprays into both nostrils daily. 16 g Barrett Henle, MD      PDMP not reviewed this encounter.   Barrett Henle, MD 02/12/21 [phone]

## 2021-02-12 NOTE — Discharge Instructions (Addendum)
Take cetirizine 10 mg, 1 daily as needed for allergies  Use Flonase no spray, 2 sprays in each side of your nose once daily for 2 to 3 weeks.  You have been given an injection of triamcinolone today.  That is a steroid to reduce allergic reaction

## 2021-02-12 NOTE — ED Triage Notes (Signed)
Patient c/o bilateral eye problem x 4-5 days.   Patient endorses RT eye drainage w/ " green" discharge.   Patient endorses eye crusting in the morning.   Patient denies trauma to eyes.   Patient endorses seasonal allergies. Patient endorses sinus pressure.   Patient hasn't taken any medications for symptoms.

## 2021-02-15 ENCOUNTER — Telehealth (HOSPITAL_COMMUNITY): Payer: Self-pay | Admitting: Family Medicine

## 2021-02-15 ENCOUNTER — Encounter (HOSPITAL_COMMUNITY): Payer: Self-pay | Admitting: Family Medicine

## 2021-02-15 MED ORDER — GENTAMICIN SULFATE 0.3 % OP SOLN: 1.0000 [drp] | Freq: Three times a day (TID) | OPHTHALMIC | 0 refills | Status: AC

## 2021-02-15 NOTE — Telephone Encounter (Signed)
Garamycin ophthalmic solution sent in. My chart message sent

## 2021-05-12 NOTE — Progress Notes (Deleted)
NEUROLOGY FOLLOW UP OFFICE NOTE  Amanda [name] [id number]  Assessment/Plan:   Chronic migraine without aura, without status migrainosus, not intractable   Migraine prevention:  Qulipta '60mg'$  daly Migraine rescue:  Sumatriptan NS or Midol.  Zofran for nausea. Limit use of pain relievers to no more than 2 days out of week to prevent risk of rebound or medication-overuse headache. Keep headache diary Follow up 6 months.  Subjective:  Amanda [name] is a 41 year old right-handed woman with asthma, depression, anxiety and morbid obesity who follows up for chronic migraine.     UPDATEResa [name] in June. Intensity:  moderate Duration:  30 minutes with Midol Frequency:  15 days a month Frequency of Midol:  daily Current NSAIDS:  Midol Current analgesics:  none Current triptans:  sumatriptan '6mg'$  NS Current ergotamine:  none Current anti-emetic:  Zofran ODT '4mg'$  Current muscle relaxants:  tizanidine PRN for headache Current anti-anxiolytic:  none Current sleep aide:  none Current Antihypertensive medications:  none Current Antidepressant medications:  none Current Anticonvulsant medications:  none Current anti-CGRP:  Qulipta '60mg'$  Current Vitamins/Herbal/Supplements:  prenatal Current Antihistamines/Decongestants:  none Other therapy:  chiropractic medicine Hormone/birth control:  none   Caffeine:  no Alcohol:  no Smoker:  no Diet:  Cut out pork, cheese, chocolate.  Increased water intake Exercise:  walking Depression/stress:  stress Sleep hygiene:  poor   HISTORY: Onset:  adolescence Location:  Holocephalic from neck and shoulders to behind the eyes Quality:  Throbbing, pressure Initial intensity:  10/10 Aura:  no Prodrome:  no Associated symptoms:  Dizziness, nausea, photophobia, phonophobia Initial Duration:  constant Initial Frequency:  Daily (16-20 days severe) Triggers:  Emotional stress, getting upset, menstrual cycle Relieving factors:   none Activity:  Difficult to function when severe   Her headaches improved during her pregnancy but returned post-partum.   Due to worsening headaches, an MRI of the brain with and without contrast was performed on 11/04/2018 and was normal.   Past NSAIDS:  Advil, Aleve Past analgesics:  BC powder, Tylenol Past abortive triptans:  Sumatriptan '100mg'$ , sumatriptan NS, Maxalt, Relpax Past abortive ergotamine:  none Past muscle relaxants:  Flexeril '10mg'$  Past anti-emetic:  Zofran Past antihypertensive medications:  would not use beta blockers due to underlying asthma. Past antidepressant medications:  Nortriptyline '25mg'$  (side effects), Zoloft Past anticonvulsant medications:  topiramate '100mg'$  (ineffective) Past anti-CGRP:  Aimovig '70mg'$  (helped but it made her feel hot, lightheaded, nauseous with vomiting), Emgality, Nurtec (rescue, ineffective) Past vitamins/Herbal/Supplements:  none Past antihistamines/decongestants:  none Other past therapies:  Trigger point injections   Family history of headaches:  No  PAST MEDICAL HISTORY: Past Medical History:  Diagnosis Date   Anginal pain ([place])    r/t anxiety   Anxiety    Asthma    no inhaler - seasonal   Constipation    Depression    Dysrhythmia    Palpatations - r/t anxiety   Genital warts    GERD (gastroesophageal reflux disease)    H/O hiatal hernia    surgery to repair   Headache(784.0)    Heart murmur    History of low transverse cesarean section 02/12/2018   MRSA cellulitis    greater than 10 yrs ago dx at urgent care    Sickle cell trait ([place])     MEDICATIONS: Current Outpatient Medications on File Prior to Visit  Medication Sig Dispense Refill   albuterol (VENTOLIN HFA) 108 (90 Base) MCG/ACT inhaler TAKE 2 PUFFS BY MOUTH 4 TIMES A  DAY     Atogepant (QULIPTA) 60 MG TABS Take 60 mg by mouth daily. 30 tablet 5   cetirizine (ZYRTEC) 10 MG tablet Take 10 mg by mouth daily.     fluticasone (FLONASE) 50 MCG/ACT nasal spray  Place 2 sprays into both nostrils daily. 16 g 0   gabapentin (NEURONTIN) 300 MG capsule TAKE 1 CAPSULE BY MOUTH AT BEDTIME     omeprazole (PRILOSEC) 20 MG capsule Take 20 mg by mouth 2 (two) times daily.     ondansetron (ZOFRAN ODT) 4 MG disintegrating tablet Take 1 tablet (4 mg total) by mouth every 8 (eight) hours as needed for nausea or vomiting. 5 tablet 0   SUMAtriptan ([name]) 20 MG/ACT nasal spray 1 spray in nostril.  May repeat x1 in 2 hours if headache persists or recurs. 10 each 3   No current facility-administered medications on file prior to visit.    ALLERGIES: Allergies  Allergen Reactions   Aimovig [Erenumab-Aooe]     Hot/lightheaded/nausea and vomiting   Doxycycline Hyclate Hives, Itching and Swelling   Ketorolac Tromethamine Other (See Comments)    Patient stated that it makes her extremely hyper   Amoxicillin Hives, Swelling and Rash    Has patient had a PCN reaction causing immediate rash, facial/tongue/throat swelling, SOB or lightheadedness with hypotension: Unsure Has patient had a PCN reaction causing severe rash involving mucus membranes or skin necrosis: Unsure Has patient had a PCN reaction that required hospitalization No Has patient had a PCN reaction occurring within the last 10 years: No If all of the above answers are "NO", then may proceed with Cephalosporin use.REACTION: facial swelling and rash.    FAMILY HISTORY: Family History  Problem Relation Age of Onset   Hypertension Mother    Depression Mother    Depression Father    Heart disease Maternal Grandmother    Breast cancer Maternal Aunt        Great Aunt   Cancer Maternal Aunt    Diabetes Maternal Grandfather    Heart disease Maternal Grandfather    Hypertension Maternal Grandfather    Congestive Heart Failure Paternal Grandfather    Anesthesia problems Neg Hx       Objective:  *** General: No acute distress.  Patient appears well-groomed.   Head:  Normocephalic/atraumatic Eyes:   Fundi examined but not visualized Neck: supple, no paraspinal tenderness, full range of motion Heart:  Regular rate and rhythm Lungs:  Clear to auscultation bilaterally Back: No paraspinal tenderness Neurological Exam: alert and oriented to person, place, and time.  Speech fluent and not dysarthric, language intact.  CN II-XII intact. Bulk and tone normal, muscle strength 5/5 throughout.  Sensation to light touch intact.  Deep tendon reflexes 2+ throughout, toes downgoing.  Finger to nose testing intact.  Gait normal, Romberg negative.   Metta Clines, DO  CC: Raelyn Number, PA

## 2021-05-17 ENCOUNTER — Ambulatory Visit: Payer: Medicaid Other | Admitting: Neurology

## 2021-05-20 ENCOUNTER — Institutional Professional Consult (permissible substitution): Payer: Medicaid Other | Admitting: Adult Health

## 2021-05-27 ENCOUNTER — Encounter: Payer: Self-pay | Admitting: Physician Assistant

## 2021-06-02 NOTE — Progress Notes (Signed)
? ?Virtual Visit via Video Note ?Patient had to switch visit to a virtual video visit.  The purpose of this virtual visit is to provide medical care while limiting exposure to the novel coronavirus.   ? ?Consent was obtained for video visit:  Yes.   ?Answered questions that patient had about telehealth interaction:  Yes.   ?I discussed the limitations, risks, security and privacy concerns of performing an evaluation and management service by telemedicine. I also discussed with the patient that there may be a patient responsible charge related to this service. The patient expressed understanding and agreed to proceed. ? ?Pt location: Home ?Physician Location: office ?Name of referring provider:  Trey Sailors, PA ?I connected with [name] at patients initiation/request on 06/04/2021 at 10:10 AM EDT by video enabled telemedicine application and verified that I am speaking with the correct person using two identifiers. ?Pt MRN:  [id number] ?Pt DOB:  28-May-1980 ?Video Participants:  HILDUR BAYER ? ? ?Assessment/Plan:  ? ?Migraine without aura, without status migrainosus, not intractable ?Left cervical radiculopathy ?  ?Migraine prevention:  Start Qulipta '60mg'$  daly ?Migraine rescue:  Sumatriptan NS or Midol, Flexeril.  Zofran for nausea. ?Limit use of pain relievers to no more than 2 days out of week to prevent risk of rebound or medication-overuse headache. ?Keep headache diary ?As she endorses some hand weakness, will get MRI of cervical spine ?Further recommendations pending results.  Follow up 6 months. ? ? ?Subjective:  ?Amanda [name] is a 41 year old right-handed woman with asthma, depression, anxiety and morbid obesity who follows up for chronic migraine.   ?  ?UPDATE: ?Plan was to start Qulipta.  However she never started it.  There was a complication with communication. ?Intensity:  moderate ?Duration:  30 minutes with Midol ?Frequency:  4 days a month severe (before and  after menses), but daily underlying headache ? ?She has been having left arm pain and burning from the left side of her neck to the shoulder down to the index finger.  Associated numbness. Hand feels weak - trouble holding things or opening containers but may be due to pain.   ? ?Frequency of Midol:   ?Rescue protocol:  Midol ?Current NSAIDS:  Midol ?Current analgesics:  none ?Current triptans:  sumatriptan '20mg'$  NS (not too effective but only tried once) ?Current ergotamine:  none ?Current anti-emetic:  Zofran ODT '4mg'$  ?Current muscle relaxants:  tizanidine PRN for headache ?Current anti-anxiolytic:  none ?Current sleep aide:  none ?Current Antihypertensive medications:  none ?Current Antidepressant medications:  none ?Current Anticonvulsant medications:  none ?Current anti-CGRP:  Qulipta '60mg'$  ?Current Vitamins/Herbal/Supplements:  prenatal ?Current Antihistamines/Decongestants:  none ?Other therapy:  chiropractic medicine ?Hormone/birth control:  none ?  ?Caffeine:  no ?Alcohol:  no ?Smoker:  no ?Diet:  Cut out pork, cheese, chocolate.  Increased water intake ?Exercise:  walking ?Depression/stress:  stress ?Sleep hygiene:  poor ?  ?HISTORY: ?Onset:  adolescence ?Location:  Holocephalic from neck and shoulders to behind the eyes ?Quality:  Throbbing, pressure ?Initial intensity:  10/10 ?Aura:  no ?Prodrome:  no ?Associated symptoms:  Dizziness, nausea, photophobia, phonophobia ?Initial Duration:  constant ?Initial Frequency:  Daily (16-20 days severe) ?Triggers:  Emotional stress, getting upset, menstrual cycle ?Relieving factors:  none ?Activity:  Difficult to function when severe ?  ?Her headaches improved during her pregnancy but returned post-partum. ?  ?Due to worsening headaches, an MRI of the brain with and without contrast was performed on 11/04/2018 and was normal. ?  ?  Past NSAIDS:  Advil, Aleve ?Past analgesics:  BC powder, Tylenol ?Past abortive triptans:  Sumatriptan '100mg'$ , sumatriptan NS, Maxalt,  Relpax ?Past abortive ergotamine:  none ?Past muscle relaxants:  Flexeril '10mg'$  ?Past anti-emetic:  Zofran ?Past antihypertensive medications:  would not use beta blockers due to underlying asthma. ?Past antidepressant medications:  Nortriptyline '25mg'$  (side effects), Zoloft ?Past anticonvulsant medications:  topiramate '100mg'$  (ineffective) ?Past anti-CGRP:  Aimovig '70mg'$  (helped but it made her feel hot, lightheaded, nauseous with vomiting), Emgality, Nurtec (rescue, ineffective) ?Past vitamins/Herbal/Supplements:  none ?Past antihistamines/decongestants:  none ?Other past therapies:  Trigger point injections ?  ?Family history of headaches:  No ? ?Past Medical History: ?Past Medical History:  ?Diagnosis Date  ? Anginal pain ([place])   ? r/t anxiety  ? Anxiety   ? Asthma   ? no inhaler - seasonal  ? Constipation   ? Depression   ? Dysrhythmia   ? Palpatations - r/t anxiety  ? Genital warts   ? GERD (gastroesophageal reflux disease)   ? H/O hiatal hernia   ? surgery to repair  ? Headache(784.0)   ? Heart murmur   ? History of low transverse cesarean section 02/12/2018  ? MRSA cellulitis   ? greater than 10 yrs ago dx at urgent care   ? Sickle cell trait ([place])   ? ? ?Medications: ?Outpatient Encounter Medications as of 06/04/2021  ?Medication Sig  ? albuterol (VENTOLIN HFA) 108 (90 Base) MCG/ACT inhaler TAKE 2 PUFFS BY MOUTH 4 TIMES A DAY  ? Atogepant (QULIPTA) 60 MG TABS Take 60 mg by mouth daily.  ? cetirizine (ZYRTEC) 10 MG tablet Take 10 mg by mouth daily.  ? fluticasone (FLONASE) 50 MCG/ACT nasal spray Place 2 sprays into both nostrils daily.  ? gabapentin (NEURONTIN) 300 MG capsule TAKE 1 CAPSULE BY MOUTH AT BEDTIME  ? omeprazole (PRILOSEC) 20 MG capsule Take 20 mg by mouth 2 (two) times daily.  ? ondansetron (ZOFRAN ODT) 4 MG disintegrating tablet Take 1 tablet (4 mg total) by mouth every 8 (eight) hours as needed for nausea or vomiting.  ? SUMAtriptan ([name]) 20 MG/ACT nasal spray 1 spray in nostril.  May repeat x1  in 2 hours if headache persists or recurs.  ? ?No facility-administered encounter medications on file as of 06/04/2021.  ? ? ?Allergies: ?Allergies  ?Allergen Reactions  ? Aimovig [Erenumab-Aooe]   ?  Hot/lightheaded/nausea and vomiting  ? Doxycycline Hyclate Hives, Itching and Swelling  ? Ketorolac Tromethamine Other (See Comments)  ?  Patient stated that it makes her extremely hyper  ? Amoxicillin Hives, Swelling and Rash  ?  Has patient had a PCN reaction causing immediate rash, facial/tongue/throat swelling, SOB or lightheadedness with hypotension: Unsure ?Has patient had a PCN reaction causing severe rash involving mucus membranes or skin necrosis: Unsure ?Has patient had a PCN reaction that required hospitalization No ?Has patient had a PCN reaction occurring within the last 10 years: No ?If all of the above answers are "NO", then may proceed with Cephalosporin use.REACTION: facial swelling and rash.  ? ? ?Family History: ?Family History  ?Problem Relation Age of Onset  ? Hypertension Mother   ? Depression Mother   ? Depression Father   ? Heart disease Maternal Grandmother   ? Breast cancer Maternal Aunt   ?     Great Aunt  ? Cancer Maternal Aunt   ? Diabetes Maternal Grandfather   ? Heart disease Maternal Grandfather   ? Hypertension Maternal Grandfather   ? Congestive Heart  Failure Paternal Grandfather   ? Anesthesia problems Neg Hx   ? ? ?Observations/Objective:   ?No acute distress.  Alert and oriented.  Speech fluent and not dysarthric.  Language intact.  Eyes orthophoric on primary gaze.  Face symmetric. ? ? ?Follow Up Instructions: ?  ? -I discussed the assessment and treatment plan with the patient. The patient was provided an opportunity to ask questions and all were answered. The patient agreed with the plan and demonstrated an understanding of the instructions. ?  ?The patient was advised to call back or seek an in-person evaluation if the symptoms worsen or if the condition fails to improve as  anticipated. ? ? ?Dudley Major, DO ?

## 2021-06-04 ENCOUNTER — Encounter: Payer: Self-pay | Admitting: Neurology

## 2021-06-04 ENCOUNTER — Telehealth (INDEPENDENT_AMBULATORY_CARE_PROVIDER_SITE_OTHER): Payer: Medicaid Other | Admitting: Neurology

## 2021-06-04 ENCOUNTER — Telehealth (HOSPITAL_COMMUNITY): Payer: Self-pay | Admitting: Pharmacy Technician

## 2021-06-04 DIAGNOSIS — M79602 Pain in left arm: Secondary | ICD-10-CM | POA: Diagnosis not present

## 2021-06-04 DIAGNOSIS — R29898 Other symptoms and signs involving the musculoskeletal system: Secondary | ICD-10-CM

## 2021-06-04 DIAGNOSIS — G43009 Migraine without aura, not intractable, without status migrainosus: Secondary | ICD-10-CM

## 2021-06-04 DIAGNOSIS — R519 Headache, unspecified: Secondary | ICD-10-CM | POA: Diagnosis not present

## 2021-06-04 MED ORDER — QULIPTA 60 MG PO TABS: 60.0000 mg | ORAL_TABLET | Freq: Every day | ORAL | 5 refills | Status: DC

## 2021-06-04 MED ORDER — CYCLOBENZAPRINE HCL 10 MG PO TABS: ORAL_TABLET | ORAL | 5 refills | Status: DC

## 2021-06-04 MED ORDER — SUMATRIPTAN 20 MG/ACT NA SOLN: NASAL | 5 refills | Status: DC

## 2021-06-04 NOTE — Telephone Encounter (Signed)
Patient Advocate Encounter ?  ?Received notification that prior authorization for Qulipta '60MG'$  tablets is required. ?  ?PA submitted on 06/04/2021 ?Key [id number] ?Status is pending ?   ? ? ? ?Lyndel Safe, CPhT ?Pharmacy Patient Advocate Specialist ?[place] Patient Advocate Team ?Direct Number: [phone]  Fax: [phone]  ?

## 2021-06-04 NOTE — Telephone Encounter (Signed)
Patient Advocate Encounter ? ?Prior Authorization for Qulipta '60MG'$  tablets has been approved.   ? ?PA# [id number] ?Effective dates: 06/04/2021 through 09/02/2021 ? ? ? ? ? ?Lyndel Safe, CPhT ?Pharmacy Patient Advocate Specialist ?[place] Patient Advocate Team ?Direct Number: [phone]  Fax: [phone]  ?

## 2021-06-08 ENCOUNTER — Encounter: Payer: Self-pay | Admitting: Adult Health

## 2021-06-08 ENCOUNTER — Ambulatory Visit (INDEPENDENT_AMBULATORY_CARE_PROVIDER_SITE_OTHER): Payer: Medicaid Other | Admitting: Adult Health

## 2021-06-08 VITALS — BP 98/70 | HR 79 | Temp 98.1°F | Ht 63.0 in | Wt 266.6 lb

## 2021-06-08 DIAGNOSIS — R0683 Snoring: Secondary | ICD-10-CM

## 2021-06-08 NOTE — Progress Notes (Signed)
? ?'@Patient'$  ID: Amanda [name], female    DOB: 01/01/1981, 41 y.o.   MRN: [id number] ? ?Chief Complaint  ?Patient presents with  ? Consult  ? ? ?Referring provider: ?Trey Sailors, PA ? ?HPI: ?41 year old female seen for sleep consult Jun 08, 2021 for snoring, restless sleep and daytime sleepiness ? ?TEST/EVENTS :  ? ?06/08/2021 Sleep Consult  ?Patient presents for sleep consult today.  Patient complains of snoring, restless sleep and daytime sleepiness.  Patient complains that she feels tired and not rested.  Typically goes to bed about 930 to 11:30 PM.  Takes about an hour to go to sleep.  Does not sleep well.  Sleep is very restless.  She is up throughout the night.  Usually gets up about 6 AM.  Patient's weight is up about 30 pounds.  Current weight is at 266 pounds with a BMI of 47.  She is had no previous sleep study before. ?Patient says she does have a lot of stress.  She has a 27-year-old son that is autistic and has special needs.  Caffeine intake is about 1 to 2 cups of tea daily.  She has no history of stroke or congestive heart failure.  She does take hydroxyzine on rare occasions to help with her sleep.  Epworth score is 8 out of 24.  Typically gets sleepy if she sitting still or watching TV.  No symptoms suspicious for cataplexy or sleep paralysis.  She feels that her sleep is very fragmented.  She says she always feels like she has trouble sleeping sometimes going to sleep sometimes staying asleep. ? ?Medical history is significant for heart murmur, asthma, chronic allergies, migraine,  ? ?Surgical history is tubal ligation ? ?Family history positive for heart disease and breast cancer ? ?Social history patient is a Pharmacist, hospital.  She lives with her 2 children ages 73 and 62.  She is single.  She is a never smoker.  No alcohol or drug use. ? ?Allergies  ?Allergen Reactions  ? Aimovig [Erenumab-Aooe]   ?  Hot/lightheaded/nausea and vomiting  ? Doxycycline Hyclate Hives, Itching and Swelling  ?  Ketorolac Tromethamine Other (See Comments)  ?  Patient stated that it makes her extremely hyper  ? Amoxicillin Hives, Swelling and Rash  ?  Has patient had a PCN reaction causing immediate rash, facial/tongue/throat swelling, SOB or lightheadedness with hypotension: Unsure ?Has patient had a PCN reaction causing severe rash involving mucus membranes or skin necrosis: Unsure ?Has patient had a PCN reaction that required hospitalization No ?Has patient had a PCN reaction occurring within the last 10 years: No ?If all of the above answers are "NO", then may proceed with Cephalosporin use.REACTION: facial swelling and rash.  ? ? ?Immunization History  ?Administered Date(s) Administered  ? Influenza Whole 02/23/2007, 12/24/2007, 02/09/2010  ? Influenza,inj,Quad PF,6+ Mos 10/29/2012  ? PPD Test 12/21/2012  ? Pneumococcal Polysaccharide-23 02/23/2007  ? Td 04/24/2004  ? Tdap 11/22/2017  ? ? ?Past Medical History:  ?Diagnosis Date  ? Anginal pain ([place])   ? r/t anxiety  ? Anxiety   ? Asthma   ? no inhaler - seasonal  ? Constipation   ? Depression   ? Dysrhythmia   ? Palpatations - r/t anxiety  ? Genital warts   ? GERD (gastroesophageal reflux disease)   ? H/O hiatal hernia   ? surgery to repair  ? Headache(784.0)   ? Heart murmur   ? History of low transverse cesarean section 02/12/2018  ? MRSA  cellulitis   ? greater than 10 yrs ago dx at urgent care   ? Sickle cell trait ([place])   ? ? ?Tobacco History: ?Social History  ? ?Tobacco Use  ?Smoking Status Never  ?Smokeless Tobacco Never  ? ?Counseling given: Not Answered ? ? ?Outpatient Medications Prior to Visit  ?Medication Sig Dispense Refill  ? albuterol (VENTOLIN HFA) 108 (90 Base) MCG/ACT inhaler TAKE 2 PUFFS BY MOUTH 4 TIMES A DAY    ? cetirizine (ZYRTEC) 10 MG tablet Take 10 mg by mouth daily.    ? cyclobenzaprine (FLEXERIL) 10 MG tablet May use every 8 hours for headache 90 tablet 5  ? famotidine (PEPCID) 20 MG tablet Take 20 mg by mouth 2 (two) times daily.    ?  fluticasone (FLONASE) 50 MCG/ACT nasal spray Place 2 sprays into both nostrils daily. 16 g 0  ? gabapentin (NEURONTIN) 300 MG capsule TAKE 1 CAPSULE BY MOUTH AT BEDTIME    ? omeprazole (PRILOSEC) 20 MG capsule Take 20 mg by mouth 2 (two) times daily.    ? ondansetron (ZOFRAN ODT) 4 MG disintegrating tablet Take 1 tablet (4 mg total) by mouth every 8 (eight) hours as needed for nausea or vomiting. 5 tablet 0  ? SUMAtriptan ([name]) 20 MG/ACT nasal spray 1 spray in nostril.  May repeat x1 in 2 hours if headache persists or recurs. 10 each 5  ? Atogepant (QULIPTA) 60 MG TABS Take 60 mg by mouth daily. (Patient not taking: Reported on 06/08/2021) 30 tablet 5  ? ?No facility-administered medications prior to visit.  ? ? ? ?Review of Systems:  ? ?Constitutional:   No  weight loss, night sweats,  Fevers, chills,  ?+fatigue, or  lassitude. ? ?HEENT:   No headaches,  Difficulty swallowing,  Tooth/dental problems, or  Sore throat,  ?              No sneezing, itching, ear ache, nasal congestion, post nasal drip,  ? ?CV:  No chest pain,  Orthopnea, PND, swelling in lower extremities, anasarca, dizziness, palpitations, syncope.  ? ?GI  No heartburn, indigestion, abdominal pain, nausea, vomiting, diarrhea, change in bowel habits, loss of appetite, bloody stools.  ? ?Resp: No shortness of breath with exertion or at rest.  No excess mucus, no productive cough,  No non-productive cough,  No coughing up of blood.  No change in color of mucus.  No wheezing.  No chest wall deformity ? ?Skin: no rash or lesions. ? ?GU: no dysuria, change in color of urine, no urgency or frequency.  No flank pain, no hematuria  ? ?MS:  No joint pain or swelling.  No decreased range of motion.  No back pain. ? ? ? ?Physical Exam ? ?BP 98/70 (BP Location: Left Arm, Patient Position: Sitting, Cuff Size: Large)   Pulse 79   Temp 98.1 ?F (36.7 ?C) (Oral)   Ht '5\' 3"'$  (1.6 m)   Wt 266 lb 9.6 oz (120.9 kg)   SpO2 97%   BMI 47.23 kg/m?  ? ?GEN: A/Ox3;  pleasant , NAD, well nourished  ?  ?HEENT:  [place]/AT,  , NOSE-clear, THROAT-clear, no lesions, no postnasal drip or exudate noted.  ?Class 2-3 MP airway ? ?NECK:  Supple w/ fair ROM; no JVD; normal carotid impulses w/o bruits; no thyromegaly or nodules palpated; no lymphadenopathy.   ? ?RESP  Clear  P & A; w/o, wheezes/ rales/ or rhonchi. no accessory muscle use, no dullness to percussion ? ?CARD:  RRR, no  m/r/g, no peripheral edema, pulses intact, no cyanosis or clubbing. ? ?GI:   Soft & nt; nml bowel sounds; no organomegaly or masses detected.  ? ?Musco: Warm bil, no deformities or joint swelling noted.  ? ?Neuro: alert, no focal deficits noted.   ? ?Skin: Warm, no lesions or rashes ? ? ? ?Lab Results: ? ? ?BMET ? ? ?BNP ?No results found for: BNP ? ?ProBNP ?No results found for: PROBNP ? ?Imaging: ?No results found. ? ? ? ?   ? View : No data to display.  ?  ?  ?  ? ? ?No results found for: NITRICOXIDE ? ? ? ? ? ?Assessment & Plan:  ? ?Snoring ?Snoring, daytime sleepiness, restless sleep, BMI 47 all suspicious for underlying sleep apnea.  Suspect patient also has a component of insomnia.  We will set up for home sleep study.  Healthy sleep regimen was discussed in detail. ? ?- discussed how weight can impact sleep and risk for sleep disordered breathing ?- discussed options to assist with weight loss: combination of diet modification, cardiovascular and strength training exercises ?  ?- had an extensive discussion regarding the adverse health consequences related to untreated sleep disordered breathing ?- specifically discussed the risks for hypertension, coronary artery disease, cardiac dysrhythmias, cerebrovascular disease, and diabetes ?- lifestyle modification discussed ?  ?- discussed how sleep disruption can increase risk of accidents, particularly when driving ?- safe driving practices were discussed ?  ?Plan  ?Patient Instructions  ?Set a Home sleep study  ?Work on healthy weight loss.  ?Do not drive if  sleepy ?Healthy sleep regimen  ?Follow up in 6-8 weeks to discuss results and treatment plan  ? ? ? ?  ? ? ?Morbid obesity ([place]) ?Healthy weight loss  ? ? ? ? ?Rexene Edison, NP ?06/08/2021 ? ?

## 2021-06-08 NOTE — Patient Instructions (Addendum)
Set a Home sleep study  ?Work on healthy weight loss.  ?Do not drive if sleepy ?Healthy sleep regimen  ?Follow up in 6-8 weeks to discuss results and treatment plan.  ? ? ? ?

## 2021-06-08 NOTE — Assessment & Plan Note (Signed)
Snoring, daytime sleepiness, restless sleep, BMI 47 all suspicious for underlying sleep apnea.  Suspect patient also has a component of insomnia.  We will set up for home sleep study.  Healthy sleep regimen was discussed in detail. ? ?- discussed how weight can impact sleep and risk for sleep disordered breathing ?- discussed options to assist with weight loss: combination of diet modification, cardiovascular and strength training exercises ?  ?- had an extensive discussion regarding the adverse health consequences related to untreated sleep disordered breathing ?- specifically discussed the risks for hypertension, coronary artery disease, cardiac dysrhythmias, cerebrovascular disease, and diabetes ?- lifestyle modification discussed ?  ?- discussed how sleep disruption can increase risk of accidents, particularly when driving ?- safe driving practices were discussed ?  ?Plan  ?Patient Instructions  ?Set a Home sleep study  ?Work on healthy weight loss.  ?Do not drive if sleepy ?Healthy sleep regimen  ?Follow up in 6-8 weeks to discuss results and treatment plan  ? ? ? ?  ? ?

## 2021-06-08 NOTE — Assessment & Plan Note (Signed)
Healthy weight loss 

## 2021-06-22 ENCOUNTER — Inpatient Hospital Stay: Admission: RE | Admit: 2021-06-22 | Payer: Medicaid Other | Source: Ambulatory Visit

## 2021-06-28 ENCOUNTER — Ambulatory Visit
Admission: RE | Admit: 2021-06-28 | Discharge: 2021-06-28 | Disposition: A | Discharge status: Home / Self Care | Payer: Medicaid Other | Source: Ambulatory Visit | Attending: Neurology | Admitting: Neurology

## 2021-06-28 DIAGNOSIS — M79602 Pain in left arm: Secondary | ICD-10-CM

## 2021-06-28 DIAGNOSIS — R29898 Other symptoms and signs involving the musculoskeletal system: Secondary | ICD-10-CM

## 2021-06-30 ENCOUNTER — Telehealth: Payer: Self-pay

## 2021-06-30 DIAGNOSIS — M79602 Pain in left arm: Secondary | ICD-10-CM

## 2021-06-30 DIAGNOSIS — R29898 Other symptoms and signs involving the musculoskeletal system: Secondary | ICD-10-CM

## 2021-06-30 NOTE — Telephone Encounter (Signed)
Patient advised of Dr.Jaffe note.  MRI Cervical Spine W/O Contrast.

## 2021-06-30 NOTE — Telephone Encounter (Signed)
-----   Message from Pieter Partridge, DO sent at 06/29/2021  3:57 PM EDT ----- MRI of brain is stable.  I want to check MRI of cervical spine without contrast to evaluate for left arm pain and left hand weakness

## 2021-07-09 ENCOUNTER — Inpatient Hospital Stay: Admission: RE | Admit: 2021-07-09 | Payer: Medicaid Other | Source: Ambulatory Visit

## 2021-08-23 ENCOUNTER — Telehealth: Payer: Self-pay | Admitting: Pharmacy Technician

## 2021-08-23 NOTE — Telephone Encounter (Signed)
Received notification from  [facility]  regarding a prior authorization for  Qulipta '60mg'$  . Authorization has been APPROVED from 08/23/21 to 08/23/22.    Authorization # Key: [id number] - PA Case ID: [id number]

## 2021-08-24 ENCOUNTER — Ambulatory Visit: Payer: Medicaid Other

## 2021-08-24 DIAGNOSIS — R0683 Snoring: Secondary | ICD-10-CM

## 2021-08-24 DIAGNOSIS — G4733 Obstructive sleep apnea (adult) (pediatric): Secondary | ICD-10-CM | POA: Diagnosis not present

## 2021-09-01 DIAGNOSIS — G4733 Obstructive sleep apnea (adult) (pediatric): Secondary | ICD-10-CM

## 2021-09-07 ENCOUNTER — Telehealth: Payer: Self-pay | Admitting: Gastroenterology

## 2021-09-07 NOTE — Telephone Encounter (Signed)
Good afternoon Dr. Bryan Lemma,  We have received a referral for  worsening reflux and dyspecptic symptoms. Patient was previously with [facility] and is requesting you as her provider. Records are showing in Epic. Please review and advise of scheduling. Thank you.

## 2021-09-09 ENCOUNTER — Telehealth: Payer: Self-pay | Admitting: Adult Health

## 2021-09-09 NOTE — Telephone Encounter (Signed)
Home sleep study completed on August 25, 2021 showed mild sleep apnea with AHI at 6.2/hour and SPO2 low at 92%. Please set up office visit to discuss sleep study results and go over treatment plan May set up for in person or virtual visit.  May double book.

## 2021-09-10 NOTE — Telephone Encounter (Signed)
Called and spoke with patient, advised of results/recommendations per Rexene Edison NP.  She verbalized understanding.  Advised her to arrive to the appointment about 4:15 pm to allow for any technical difficulties and we would send her a link to connect.  Scheduled for 09/13/2021 at 4:30 pm.  Nothing further needed.

## 2021-09-13 ENCOUNTER — Telehealth: Payer: Medicaid Other | Admitting: Adult Health

## 2021-09-13 ENCOUNTER — Telehealth: Payer: Self-pay | Admitting: Adult Health

## 2021-09-13 NOTE — Telephone Encounter (Signed)
I called the patient and let her know she would need to re-schedule as I had left her 2 messages to start her video visit.  Please get her re-scheduled for another day with Tammy Parrett.

## 2021-09-15 ENCOUNTER — Encounter: Payer: Self-pay | Admitting: Gastroenterology

## 2021-09-29 ENCOUNTER — Telehealth: Payer: Self-pay | Admitting: Neurology

## 2021-09-29 ENCOUNTER — Other Ambulatory Visit: Payer: Self-pay | Admitting: Neurology

## 2021-09-29 MED ORDER — SUMATRIPTAN 20 MG/ACT NA SOLN: NASAL | 5 refills | Status: DC

## 2021-09-29 NOTE — Telephone Encounter (Signed)
Pt took her first dose of qulipta and she cant stop gagging and dry heaving. Needs to speak with someone

## 2021-09-29 NOTE — Telephone Encounter (Signed)
Per Patient she had a headache and decided to take the [place]. Per Patient it made her sick nausea. Per Patient the pharmacy never advised of the prescription ready for pick for four months.  Per patient she would like to use sumatriptan spray as needed only. Per patient unsure if she will like to take the Qulipta any more. " Did not make me feel right"

## 2021-09-29 NOTE — Telephone Encounter (Signed)
I sent refill for sumatriptan spray.  If patient agreeable, please send prescription for Ajovy injection every 28 days with 5 refills.  It is another monthly injection.

## 2021-09-29 NOTE — Telephone Encounter (Signed)
Patient advised of script sent. Patient declined injection at this time.

## 2021-10-29 ENCOUNTER — Ambulatory Visit: Payer: Medicaid Other | Admitting: Gastroenterology

## 2021-10-29 ENCOUNTER — Encounter: Payer: Self-pay | Admitting: Gastroenterology

## 2021-10-29 VITALS — BP 122/86 | HR 75 | Ht 63.0 in | Wt 258.5 lb

## 2021-10-29 DIAGNOSIS — R1012 Left upper quadrant pain: Secondary | ICD-10-CM | POA: Diagnosis not present

## 2021-10-29 DIAGNOSIS — K219 Gastro-esophageal reflux disease without esophagitis: Secondary | ICD-10-CM | POA: Diagnosis not present

## 2021-10-29 DIAGNOSIS — Z8619 Personal history of other infectious and parasitic diseases: Secondary | ICD-10-CM

## 2021-10-29 DIAGNOSIS — K5900 Constipation, unspecified: Secondary | ICD-10-CM

## 2021-10-29 DIAGNOSIS — Z6841 Body Mass Index (BMI) 40.0 and over, adult: Secondary | ICD-10-CM

## 2021-10-29 DIAGNOSIS — R109 Unspecified abdominal pain: Secondary | ICD-10-CM

## 2021-10-29 DIAGNOSIS — R142 Eructation: Secondary | ICD-10-CM

## 2021-10-29 MED ORDER — LINACLOTIDE 145 MCG PO CAPS: 145.0000 ug | ORAL_CAPSULE | Freq: Every day | ORAL | 3 refills | Status: AC

## 2021-10-29 MED ORDER — DICYCLOMINE HCL 10 MG PO CAPS: 10.0000 mg | ORAL_CAPSULE | Freq: Four times a day (QID) | ORAL | 3 refills | Status: DC | PRN

## 2021-10-29 MED ORDER — CLENPIQ 10-3.5-12 MG-GM -GM/175ML PO SOLN: 1.0000 | Freq: Once | ORAL | 0 refills | Status: AC

## 2021-10-29 MED ORDER — ESOMEPRAZOLE MAGNESIUM 40 MG PO CPDR: 40.0000 mg | DELAYED_RELEASE_CAPSULE | Freq: Two times a day (BID) | ORAL | 3 refills | Status: AC

## 2021-10-29 NOTE — Patient Instructions (Signed)
We have sent the following medications to your pharmacy for you to pick up at your convenience: Bentyl, linzess, Nexium, Clenpiq  You have been scheduled for an endoscopy and colonoscopy. Please follow the written instructions given to you at your visit today. Please pick up your prep supplies at the pharmacy within the next 1-3 days. If you use inhalers (even only as needed), please bring them with you on the day of your procedure.   Stop Omeprazole when you start Nexium  Due to recent changes in healthcare laws, you may see the results of your imaging and laboratory studies on MyChart before your provider has had a chance to review them.  We understand that in some cases there may be results that are confusing or concerning to you. Not all laboratory results come back in the same time frame and the provider may be waiting for multiple results in order to interpret others.  Please give [place] 48 hours in order for your provider to thoroughly review all the results before contacting the office for clarification of your results.     Thank you for choosing me and [facility].  Vito Cirigliano, D.O.

## 2021-10-29 NOTE — Progress Notes (Signed)
Chief Complaint: GERD, belching, diarrhea   Referring Provider:     Trey Sailors, PA   HPI:     Amanda [name] is a 41 y.o. female with a history of anxiety, depression, asthma, sickle cell trait, GERD, obesity (BMI 45.8), migraines, umbilical hernia repair with mesh in 04/2015, cesarean, referred to the [facility] for evaluation of GERD.  Previously followed at [facility], last seen on 02/18/2019 for continued follow-up of GERD.  At that time, was treated with omeprazole 20 mg/day, famotidine 20 mg/day along with fiber supplement, MiraLAX, and footstool for treatment of constipation.  Was started on Linzess and declined referral to [facility].  Did have EGD (normal) and colonoscopy (normal) in 2013.  Was seen in the urgent care for heartburn on 09/16/2021.  Was treated with GI cocktail, omeprazole, Zofran with improvement and discharged home with omeprazole and famotidine.Has had Nexium years ago, but cannot recall if that was efficacious.   Reports having GERD for years. Index sxs of HB, regurgitation, nausea. No dysphagia. Can now occur after anything she eats/drinks. Currently taking Prilosec 20 mg BID and famotidine BID, but can take Prilosec up to Q6H if continued sxs. Can have LUQ pain.   Reports having had H pylori tx 4 times in the past. Has had GI sxs for at least the last 11 years or so. All progressively worsening now.   Separately with chronic constipation and occasional diarrhea. Has trialed Miralax, stool softeners, without much improvement. Not sure if Linzess worked years ago.   Mother died young and unsure of father's history.    Past Medical History:  Diagnosis Date   Anginal pain ([place])    r/t anxiety   Anxiety    Asthma    no inhaler - seasonal   Constipation    Depression    Dysrhythmia    Palpatations - r/t anxiety   Genital warts    GERD (gastroesophageal reflux disease)    H/O hiatal hernia    surgery to repair    Headache(784.0)    Heart murmur    History of low transverse cesarean section 02/12/2018   MRSA cellulitis    greater than 10 yrs ago dx at urgent care    Sickle cell trait [facility])      Past Surgical History:  Procedure Laterality Date   CERVICAL CONE BIOPSY     CESAREAN SECTION  01/20/2012   Procedure: CESAREAN SECTION;  Surgeon: Melina Schools, MD;  Location: [facility];  Service: Obstetrics;  Laterality: N/A;   CESAREAN SECTION WITH BILATERAL TUBAL LIGATION N/A 02/13/2018   Procedure: CESAREAN SECTION WITH BILATERAL TUBAL LIGATION;  Surgeon: Janyth Contes, MD;  Location: [place];  Service: Obstetrics;  Laterality: N/A;  Nira Conn, RNFA  Needs [name]   COLONOSCOPY     DILATION AND EVACUATION N/A 03/30/2017   Procedure: DILATATION AND EVACUATION WITH ULTRASOUND GUIDANCE;  Surgeon: Janyth Contes, MD;  Location: [facility];  Service: Gynecology;  Laterality: N/A;   HERNIA REPAIR     INSERTION OF MESH N/A 05/15/2015   Procedure: INSERTION OF MESH;  Surgeon: Ralene Ok, MD;  Location: [facility];  Service: General;  Laterality: N/A;   MARSUPIALIZATION URETHRAL DIVERTICULUM     UMBILICAL HERNIA REPAIR N/A 05/15/2015   Procedure: LAPAROSCOPIC UMBILICAL HERNIA;  Surgeon: Ralene Ok, MD;  Location: [facility];  Service: General;  Laterality: N/A;   UPPER GI ENDOSCOPY  WISDOM TOOTH EXTRACTION     Family History  Problem Relation Age of Onset   Hypertension Mother    Depression Mother    Depression Father    Heart disease Maternal Grandmother    Breast cancer Maternal Aunt        Great Aunt   Cancer Maternal Aunt    Diabetes Maternal Grandfather    Heart disease Maternal Grandfather    Hypertension Maternal Grandfather    Congestive Heart Failure Paternal Grandfather    Anesthesia problems Neg Hx    Social History   Tobacco Use   Smoking status: Never   Smokeless tobacco: Never  Vaping Use   Vaping Use: Never used  Substance Use Topics   Alcohol use: No     Alcohol/week: 0.0 standard drinks of alcohol   Drug use: No   Current Outpatient Medications  Medication Sig Dispense Refill   albuterol (VENTOLIN HFA) 108 (90 Base) MCG/ACT inhaler TAKE 2 PUFFS BY MOUTH 4 TIMES A DAY     cetirizine (ZYRTEC) 10 MG tablet Take 10 mg by mouth daily.     cyclobenzaprine (FLEXERIL) 10 MG tablet May use every 8 hours for headache 90 tablet 5   famotidine (PEPCID) 20 MG tablet Take 20 mg by mouth 2 (two) times daily.     fluticasone (FLONASE) 50 MCG/ACT nasal spray Place 2 sprays into both nostrils daily. 16 g 0   omeprazole (PRILOSEC) 20 MG capsule Take 20 mg by mouth 2 (two) times daily.     SUMAtriptan ([name]) 20 MG/ACT nasal spray 1 spray in nostril.  May repeat x1 in 2 hours if headache persists or recurs. 10 each 5   gabapentin (NEURONTIN) 300 MG capsule TAKE 1 CAPSULE BY MOUTH AT BEDTIME (Patient not taking: Reported on 10/29/2021)     ondansetron (ZOFRAN ODT) 4 MG disintegrating tablet Take 1 tablet (4 mg total) by mouth every 8 (eight) hours as needed for nausea or vomiting. (Patient not taking: Reported on 10/29/2021) 5 tablet 0   No current facility-administered medications for this visit.   Allergies  Allergen Reactions   Aimovig [Erenumab-Aooe]     Hot/lightheaded/nausea and vomiting   Doxycycline Hyclate Hives, Itching and Swelling   Ketorolac Tromethamine Other (See Comments)    Patient stated that it makes her extremely hyper   Amoxicillin Hives, Swelling and Rash    Has patient had a PCN reaction causing immediate rash, facial/tongue/throat swelling, SOB or lightheadedness with hypotension: Unsure Has patient had a PCN reaction causing severe rash involving mucus membranes or skin necrosis: Unsure Has patient had a PCN reaction that required hospitalization No Has patient had a PCN reaction occurring within the last 10 years: No If all of the above answers are "NO", then may proceed with Cephalosporin use.REACTION: facial swelling and rash.      Review of Systems: All systems reviewed and negative except where noted in HPI.     Physical Exam:    Wt Readings from Last 3 Encounters:  10/29/21 258 lb 8 oz (117.3 kg)  06/08/21 266 lb 9.6 oz (120.9 kg)  04/17/19 260 lb (117.9 kg)    Ht '5\' 3"'$  (1.6 m)   Wt 258 lb 8 oz (117.3 kg)   BMI 45.79 kg/m  Constitutional:  Pleasant, in no acute distress. Psychiatric: Normal mood and affect. Behavior is normal. Cardiovascular: Normal rate, regular rhythm. No edema Pulmonary/chest: Effort normal and breath sounds normal. No wheezing, rales or rhonchi. Abdominal: Soft, nondistended, nontender. Bowel sounds active  throughout. There are no masses palpable. No hepatomegaly. Neurological: Alert and oriented to person place and time. Skin: Skin is warm and dry. No rashes noted.   ASSESSMENT AND PLAN;   1) GERD 2) Belching 3) Heartburn - Nexium 40 mg PO BID - Stop Omeprazole when she is able to start the Nexium - Resume famotidine - Continue antireflux lifestyle/dietary modifications with avoidance of exacerbating foods, avoid overeating, avoid eating close to bedtime - EGD to evaluate for erosive esophagitis, LES laxity, hiatal hernia  4) Constipation 5) Abdominal cramping Clinical presentation seems most consistent with chronic constipation with occasional overflow diarrhea - Colonoscopy to evaluate for medical/luminal pathology, luminal narrowing, stricture, etc. - Start Linzess 145 mcg/day - Adequate hydration with only 6 points of water/day - Bentyl 10 mg prn Q6H #30, RF3 - Will need extended 2-day bowel preparation for colonoscopy - If evaluation unrevealing, plan for CT A/P and ARM  6) History of H. pylori 7) LUQ pain - Evaluate for PUD, gastritis, H. pylori recurrence at time of EGD as above with gastric biopsies as appropriate  8) Obesity (BMI 45.8) Suspect reflux harder to control given morbid obesity.  Evaluate for hiatal hernia, etc. time EGD   The  indications, risks, and benefits of EGD and colonoscopy were explained to the patient in detail. Risks include but are not limited to bleeding, perforation, adverse reaction to medications, and cardiopulmonary compromise. Sequelae include but are not limited to the possibility of surgery, hospitalization, and mortality. The patient verbalized understanding and wished to proceed. All questions answered, referred to scheduler and bowel prep ordered. Further recommendations pending results of the exam.     Lavena Bullion, DO, FACG  10/29/2021, 9:45 AM   Trey Sailors, PA

## 2021-11-26 ENCOUNTER — Encounter: Payer: Medicaid Other | Admitting: Gastroenterology

## 2021-12-02 NOTE — Progress Notes (Signed)
NEUROLOGY FOLLOW UP OFFICE NOTE  [name] [id number]  Assessment/Plan:     Migraine without aura, without status migrainosus, not intractable - aggravated by underlying emotional stress Right index finger pain/burning - may be arthritis vs tendonitis.   Seems less likely carpal tunnel syndrome vs radiculopathy   Migraine prevention:  Start venlafaxine XR 37.'5mg'$  daily - we can increase to '75mg'$  daily in 4 weeks if needed. Migraine rescue:  Sumatriptan NS or Midol, Flexeril.  Zofran for nausea. Limit use of pain relievers to no more than 2 days out of week to prevent risk of rebound or medication-overuse headache. Keep headache diary Will have her wear a right wrist splint.  If ineffective, would recommend seeing a hand specialist. Follow up 4-5 months.     Subjective:  Amanda [name] is a 41 year old right-handed woman with asthma, depression, anxiety and morbid obesity who follows up for chronic migraine.     UPDATE: Amanda [name].  She states it made her nauseous.   Wanted to switch to Ajovy but she declined the injection.   Intensity:  moderate Duration:  30 minutes with Midol Frequency:  15 days a month   Never had the MRI C-spine scheduled but left sided cervical radicular pain has resolved.  Now she reports a pain and burning along the metacarpal.  No neck or radicular pain.  No paresthesias involving the hand or fingers.     Rescue protocol:  Midol Current NSAIDS:  Midol Current analgesics:  none Current triptans:  sumatriptan '20mg'$  NS Current ergotamine:  none Current anti-emetic:  Zofran ODT '4mg'$  Current muscle relaxants:  Flexeril '10mg'$  TID PRN headACHE Current anti-anxiolytic:  none Current sleep aide:  none Current Antihypertensive medications:  none Current Antidepressant medications:  none Current Anticonvulsant medications:  gabapentin '300mg'$  PRN Current anti-CGRP: none Current Vitamins/Herbal/Supplements:  prenatal Current  Antihistamines/Decongestants:  Flonase, Zyrtec Other therapy:  chiropractic medicine Hormone/birth control:  none   Caffeine:  no Alcohol:  no Smoker:  no Diet:  Cut out pork, cheese, chocolate.  Increased water intake Exercise:  walking Depression/stress:  stress Sleep hygiene:  poor   HISTORY: Onset:  adolescence Location:  Holocephalic from neck and shoulders to behind the eyes Quality:  Throbbing, pressure Initial intensity:  10/10 Aura:  no Prodrome:  no Associated symptoms:  Dizziness, nausea, photophobia, phonophobia Initial Duration:  constant Initial Frequency:  Daily (16-20 days severe) Triggers:  Emotional stress, getting upset, menstrual cycle, certain perfumes, sun light Relieving factors:  none Activity:  Difficult to function when severe   Her headaches improved during her pregnancy but returned post-partum.   Due to worsening headaches, an MRI of the brain with and without contrast was performed on 11/04/2018 and was normal.   Past NSAIDS:  Advil, Aleve Past analgesics:  BC powder, Tylenol Past abortive triptans:  Sumatriptan '100mg'$ , sumatriptan NS, Maxalt, Relpax Past abortive ergotamine:  none Past muscle relaxants:  tizanidine Past anti-emetic:  Zofran Past antihypertensive medications:  would not use beta blockers due to underlying asthma. Past antidepressant medications:  Nortriptyline '25mg'$  (side effects), Zoloft Past anticonvulsant medications:  topiramate '100mg'$  (ineffective) Past anti-CGRP:  Aimovig '70mg'$  (helped but it made her feel hot, lightheaded, nauseous with vomiting), Emgality, Nurtec (rescue), Qulipta '60mg'$  (caused nausea) Past vitamins/Herbal/Supplements:  none Past antihistamines/decongestants:  none Other past therapies:  Trigger point injections   Family history of headaches:  No  PAST MEDICAL HISTORY: Past Medical History:  Diagnosis Date   Anginal pain ([place])    r/t  anxiety   Anxiety    Asthma    no inhaler - seasonal    Constipation    Depression    Dysrhythmia    Palpatations - r/t anxiety   Genital warts    GERD (gastroesophageal reflux disease)    H/O hiatal hernia    surgery to repair   Headache(784.0)    Heart murmur    History of low transverse cesarean section 02/12/2018   MRSA cellulitis    greater than 10 yrs ago dx at urgent care    Sickle cell trait ([place])     MEDICATIONS: Current Outpatient Medications on File Prior to Visit  Medication Sig Dispense Refill   albuterol (VENTOLIN HFA) 108 (90 Base) MCG/ACT inhaler TAKE 2 PUFFS BY MOUTH 4 TIMES A DAY     cetirizine (ZYRTEC) 10 MG tablet Take 10 mg by mouth daily.     cyclobenzaprine (FLEXERIL) 10 MG tablet May use every 8 hours for headache 90 tablet 5   dicyclomine (BENTYL) 10 MG capsule Take 1 capsule (10 mg total) by mouth every 6 (six) hours as needed for spasms. 30 capsule 3   esomeprazole (NEXIUM) 40 MG capsule Take 1 capsule (40 mg total) by mouth 2 (two) times daily before a meal. 180 capsule 3   famotidine (PEPCID) 20 MG tablet Take 20 mg by mouth 2 (two) times daily.     fluticasone (FLONASE) 50 MCG/ACT nasal spray Place 2 sprays into both nostrils daily. 16 g 0   gabapentin (NEURONTIN) 300 MG capsule TAKE 1 CAPSULE BY MOUTH AT BEDTIME (Patient not taking: Reported on 10/29/2021)     linaclotide (LINZESS) 145 MCG CAPS capsule Take 1 capsule (145 mcg total) by mouth daily before breakfast. 90 capsule 3   ondansetron (ZOFRAN ODT) 4 MG disintegrating tablet Take 1 tablet (4 mg total) by mouth every 8 (eight) hours as needed for nausea or vomiting. (Patient not taking: Reported on 10/29/2021) 5 tablet 0   SUMAtriptan ([name]) 20 MG/ACT nasal spray 1 spray in nostril.  May repeat x1 in 2 hours if headache persists or recurs. 10 each 5   No current facility-administered medications on file prior to visit.    ALLERGIES: Allergies  Allergen Reactions   Aimovig [Erenumab-Aooe]     Hot/lightheaded/nausea and vomiting   Doxycycline  Hyclate Hives, Itching and Swelling   Ketorolac Tromethamine Other (See Comments)    Patient stated that it makes her extremely hyper   Amoxicillin Hives, Swelling and Rash    Has patient had a PCN reaction causing immediate rash, facial/tongue/throat swelling, SOB or lightheadedness with hypotension: Unsure Has patient had a PCN reaction causing severe rash involving mucus membranes or skin necrosis: Unsure Has patient had a PCN reaction that required hospitalization No Has patient had a PCN reaction occurring within the last 10 years: No If all of the above answers are "NO", then may proceed with Cephalosporin use.REACTION: facial swelling and rash.    FAMILY HISTORY: Family History  Problem Relation Age of Onset   Hypertension Mother    Depression Mother    Depression Father    Heart disease Maternal Grandmother    Breast cancer Maternal Aunt        Great Aunt   Cancer Maternal Aunt    Diabetes Maternal Grandfather    Heart disease Maternal Grandfather    Hypertension Maternal Grandfather    Congestive Heart Failure Paternal Grandfather    Anesthesia problems Neg Hx       Objective:  Blood pressure 108/71, pulse 74, height '5\' 3"'$  (1.6 m), weight 258 lb (117 kg), SpO2 100 %. General: No acute distress.  Patient appears well-groomed.   Head:  Normocephalic/atraumatic Eyes:  Fundi examined but not visualized Neck: supple, no paraspinal tenderness, full range of motion Heart:  Regular rate and rhythm Lungs:  Clear to auscultation bilaterally Back: No paraspinal tenderness Neurological Exam: alert and oriented to person, place, and time.  Speech fluent and not dysarthric, language intact.  CN II-XII intact. Bulk and tone normal, muscle strength 5/5 throughout.  Sensation to light touch intact.  Deep tendon reflexes 2+ throughout, toes downgoing.  Finger to nose testing intact.  Gait normal, Romberg negative.   Metta Clines, DO  CC: Amanda Number, PA

## 2021-12-06 ENCOUNTER — Ambulatory Visit (INDEPENDENT_AMBULATORY_CARE_PROVIDER_SITE_OTHER): Payer: Medicaid Other | Admitting: Neurology

## 2021-12-06 ENCOUNTER — Encounter: Payer: Self-pay | Admitting: Neurology

## 2021-12-06 VITALS — BP 108/71 | HR 74 | Ht 63.0 in | Wt 258.0 lb

## 2021-12-06 DIAGNOSIS — M79644 Pain in right finger(s): Secondary | ICD-10-CM

## 2021-12-06 DIAGNOSIS — F341 Dysthymic disorder: Secondary | ICD-10-CM

## 2021-12-06 DIAGNOSIS — G43709 Chronic migraine without aura, not intractable, without status migrainosus: Secondary | ICD-10-CM

## 2021-12-06 MED ORDER — VENLAFAXINE HCL ER 37.5 MG PO CP24: 37.5000 mg | ORAL_CAPSULE | Freq: Every day | ORAL | 5 refills | Status: DC

## 2021-12-06 MED ORDER — SUMATRIPTAN 20 MG/ACT NA SOLN: NASAL | 5 refills | Status: DC

## 2021-12-06 MED ORDER — ONDANSETRON 4 MG PO TBDP: 4.0000 mg | ORAL_TABLET | Freq: Three times a day (TID) | ORAL | 0 refills | Status: DC | PRN

## 2021-12-06 NOTE — Patient Instructions (Signed)
Start venlafaxine XR 37.'5mg'$  every morning with breakfast.  If no improvement in 4 weeks, contact me and we can increase dose Sumatriptan and Zofran refilled Try wearing right wrist splint.  Limit use of pain relievers to no more than 2 days out of week to prevent risk of rebound or medication-overuse headache. Follow up 4-5 months.

## 2021-12-08 ENCOUNTER — Ambulatory Visit (AMBULATORY_SURGERY_CENTER): Payer: Self-pay

## 2021-12-08 VITALS — Ht 63.0 in | Wt 258.0 lb

## 2021-12-08 DIAGNOSIS — R1012 Left upper quadrant pain: Secondary | ICD-10-CM

## 2021-12-08 DIAGNOSIS — K219 Gastro-esophageal reflux disease without esophagitis: Secondary | ICD-10-CM

## 2021-12-08 DIAGNOSIS — K5900 Constipation, unspecified: Secondary | ICD-10-CM

## 2021-12-08 NOTE — Progress Notes (Signed)
Pre visit completed via phone call; Patient verified name, DOB, and address;  No egg or soy allergy known to patient;  No issues known to pt with past sedation with any surgeries or procedures; Patient denies ever being told they had issues or difficulty with intubation;  No FH of Malignant Hyperthermia; Pt is not on diet pills; Pt is not on home 02;  Pt is not on blood thinners;  Pt reports issues with constipation - currently on Linzess and encouraged to increase oral fluids and activity; also encouraged to take OTC laxatives and/or stool softeners; No A fib or A flutter; Have any cardiac testing pending--NO Pt instructed to use Single[employer] or GoodRx for a price reduction on prep;   Insurance verified during [place] appt=[place] [employer]  Patient's chart reviewed by Osvaldo Angst CNRA prior to previsit and patient appropriate for the [place].  Previsit completed and red dot placed by patient's name on their procedure day (on provider's schedule).    Patient reports she already has Clenpiq as her prep- prep instructions sent to patient via Mychart as well as mailed to patient per her request;

## 2022-01-01 ENCOUNTER — Other Ambulatory Visit: Payer: Self-pay | Admitting: Neurology

## 2022-01-05 ENCOUNTER — Encounter: Payer: Self-pay | Admitting: Neurology

## 2022-01-06 ENCOUNTER — Ambulatory Visit (AMBULATORY_SURGERY_CENTER): Payer: Medicaid Other | Admitting: Gastroenterology

## 2022-01-06 ENCOUNTER — Encounter: Payer: Self-pay | Admitting: Gastroenterology

## 2022-01-06 VITALS — BP 137/89 | HR 68 | Temp 96.8°F | Resp 12 | Ht 63.0 in | Wt 258.0 lb

## 2022-01-06 DIAGNOSIS — Z8619 Personal history of other infectious and parasitic diseases: Secondary | ICD-10-CM

## 2022-01-06 DIAGNOSIS — R109 Unspecified abdominal pain: Secondary | ICD-10-CM | POA: Diagnosis not present

## 2022-01-06 DIAGNOSIS — R1012 Left upper quadrant pain: Secondary | ICD-10-CM

## 2022-01-06 DIAGNOSIS — R194 Change in bowel habit: Secondary | ICD-10-CM

## 2022-01-06 DIAGNOSIS — D125 Benign neoplasm of sigmoid colon: Secondary | ICD-10-CM

## 2022-01-06 DIAGNOSIS — K2950 Unspecified chronic gastritis without bleeding: Secondary | ICD-10-CM | POA: Diagnosis not present

## 2022-01-06 DIAGNOSIS — B9681 Helicobacter pylori [H. pylori] as the cause of diseases classified elsewhere: Secondary | ICD-10-CM | POA: Diagnosis not present

## 2022-01-06 DIAGNOSIS — K2970 Gastritis, unspecified, without bleeding: Secondary | ICD-10-CM | POA: Diagnosis not present

## 2022-01-06 DIAGNOSIS — R197 Diarrhea, unspecified: Secondary | ICD-10-CM

## 2022-01-06 DIAGNOSIS — K640 First degree hemorrhoids: Secondary | ICD-10-CM

## 2022-01-06 DIAGNOSIS — R12 Heartburn: Secondary | ICD-10-CM | POA: Diagnosis not present

## 2022-01-06 DIAGNOSIS — K5900 Constipation, unspecified: Secondary | ICD-10-CM

## 2022-01-06 DIAGNOSIS — K219 Gastro-esophageal reflux disease without esophagitis: Secondary | ICD-10-CM

## 2022-01-06 DIAGNOSIS — R142 Eructation: Secondary | ICD-10-CM

## 2022-01-06 DIAGNOSIS — K635 Polyp of colon: Secondary | ICD-10-CM

## 2022-01-06 MED ORDER — SODIUM CHLORIDE 0.9 % IV SOLN
500.0000 mL | Freq: Once | INTRAVENOUS | Status: DC
Start: 2022-01-06 — End: 2022-01-06

## 2022-01-06 NOTE — Progress Notes (Signed)
Pt's states no medical or surgical changes since previsit or office visit. VS assessed by C.W 

## 2022-01-06 NOTE — Op Note (Signed)
[place] Patient Name: Amanda [name] Procedure Date: 01/06/2022 3:16 PM MRN: [id number] Endoscopist: Gerrit Heck , MD, [id number] Age: 41 Referring MD:  Date of Birth: 1980/11/17 Gender: Female Account #: [id number] Procedure:                Upper GI endoscopy Indications:              Heartburn, Suspected esophageal reflux, Nausea,                            Regurgitation, History of H pylori, Change in bowel                            habits Medicines:                Monitored Anesthesia Care Procedure:                Pre-Anesthesia Assessment:                           - Prior to the procedure, a History and Physical                            was performed, and patient medications and                            allergies were reviewed. The patient's tolerance of                            previous anesthesia was also reviewed. The risks                            and benefits of the procedure and the sedation                            options and risks were discussed with the patient.                            All questions were answered, and informed consent                            was obtained. Prior Anticoagulants: The patient has                            taken no anticoagulant or antiplatelet agents. ASA                            Grade Assessment: II - A patient with mild systemic                            disease. After reviewing the risks and benefits,                            the patient was deemed in satisfactory condition to  undergo the procedure.                           After obtaining informed consent, the endoscope was                            passed under direct vision. Throughout the                            procedure, the patient's blood pressure, pulse, and                            oxygen saturations were monitored continuously. The                            Endoscope was introduced through  the mouth, and                            advanced to the second part of duodenum. The upper                            GI endoscopy was accomplished without difficulty.                            The patient tolerated the procedure well. Scope In: Scope Out: Findings:                 The examined esophagus was normal.                           The Z-line was regular and was found 40 cm from the                            incisors.                           The entire examined stomach was normal. Biopsies                            were taken with a cold forceps for Helicobacter                            pylori testing. Estimated blood loss was minimal.                           The examined duodenum was normal. Biopsies were                            taken with a cold forceps for histology. Estimated                            blood loss was minimal. Complications:            No immediate complications. Estimated Blood Loss:     Estimated blood loss was minimal. Impression:               -  Normal esophagus.                           - Z-line regular, 40 cm from the incisors.                           - Normal stomach. Biopsied.                           - Normal examined duodenum. Biopsied. Recommendation:           - Patient has a contact number available for                            emergencies. The signs and symptoms of potential                            delayed complications were discussed with the                            patient. Return to normal activities tomorrow.                            Written discharge instructions were provided to the                            patient.                           - Resume previous diet.                           - Continue present medications.                           - Await pathology results.                           - Perform a colonoscopy today. Gerrit Heck, MD 01/06/2022 3:44:36 PM

## 2022-01-06 NOTE — Progress Notes (Signed)
GASTROENTEROLOGY PROCEDURE H&P NOTE   Primary Care Physician: Trey Sailors, PA    Reason for Procedure:  GERD, belching, heartburn, abdominal cramping, history of H. pylori, LUQ pain, diarrhea, change in bowel habits  Plan:    EGD, colonoscopy  Patient is appropriate for endoscopic procedure(s) in the ambulatory ([place]) setting.  The nature of the procedure, as well as the risks, benefits, and alternatives were carefully and thoroughly reviewed with the patient. Ample time for discussion and questions allowed. The patient understood, was satisfied, and agreed to proceed.     HPI: Amanda [name] is a 41 y.o. female who presents for EGD and colonoscopy for evaluation of multiple GI symptoms to include GERD, heartburn, belching, history of H. pylori, LUQ pain, abdominal cramping, change in bowel habits.  Past Medical History:  Diagnosis Date   Anginal pain ([place])    r/t anxiety   Anxiety    on meds   Asthma    no inhaler - seasonal   Constipation    Depression    on meds   Dysrhythmia    Palpatations - r/t anxiety   Genital warts    GERD (gastroesophageal reflux disease)    on meds   H/O hiatal hernia    surgery to repair   Headache(784.0)    Heart murmur    History of low transverse cesarean section 02/12/2018   MRSA cellulitis    greater than 10 yrs ago dx at urgent care    Seasonal allergies    Sickle cell trait [facility])     Past Surgical History:  Procedure Laterality Date   CERVICAL CONE BIOPSY     CESAREAN SECTION  01/20/2012   Procedure: CESAREAN SECTION;  Surgeon: Melina Schools, MD;  Location: [facility];  Service: Obstetrics;  Laterality: N/A;   CESAREAN SECTION WITH BILATERAL TUBAL LIGATION N/A 02/13/2018   Procedure: CESAREAN SECTION WITH BILATERAL TUBAL LIGATION;  Surgeon: Janyth Contes, MD;  Location: [facility];  Service: Obstetrics;  Laterality: N/A;  Nira Conn, RNFA  Needs [name]   COLONOSCOPY     DILATION AND EVACUATION  N/A 03/30/2017   Procedure: DILATATION AND EVACUATION WITH ULTRASOUND GUIDANCE;  Surgeon: Janyth Contes, MD;  Location: [facility];  Service: Gynecology;  Laterality: N/A;   HERNIA REPAIR     INSERTION OF MESH N/A 05/15/2015   Procedure: INSERTION OF MESH;  Surgeon: Ralene Ok, MD;  Location: [facility];  Service: General;  Laterality: N/A;   MARSUPIALIZATION URETHRAL DIVERTICULUM     UMBILICAL HERNIA REPAIR N/A 05/15/2015   Procedure: LAPAROSCOPIC UMBILICAL HERNIA;  Surgeon: Ralene Ok, MD;  Location: [facility];  Service: General;  Laterality: N/A;   UPPER GI ENDOSCOPY     WISDOM TOOTH EXTRACTION      Prior to Admission medications   Medication Sig Start Date End Date Taking? Authorizing Provider  dicyclomine (BENTYL) 10 MG capsule Take 1 capsule (10 mg total) by mouth every 6 (six) hours as needed for spasms. 10/29/21  Yes Yalitza Teed V, DO  esomeprazole (NEXIUM) 40 MG capsule Take 1 capsule (40 mg total) by mouth 2 (two) times daily before a meal. 10/29/21  Yes Kearston Putman V, DO  famotidine (PEPCID) 20 MG tablet Take 20 mg by mouth 2 (two) times daily. 03/16/21  Yes [provider]  SUMAtriptan ([name]) 20 MG/ACT nasal spray 1 spray in nostril.  May repeat x1 in 2 hours if headache persists or recurs. 12/06/21  Yes Tomi Likens, Adam R, DO  albuterol (  VENTOLIN HFA) 108 (90 Base) MCG/ACT inhaler Inhale 2 puffs into the lungs every 6 (six) hours as needed. Patient not taking: Reported on 01/06/2022 10/18/18   [provider]  cetirizine (ZYRTEC) 10 MG tablet Take 10 mg by mouth daily. Patient not taking: Reported on 01/06/2022 10/18/18   [provider]  cyclobenzaprine (FLEXERIL) 10 MG tablet May use every 8 hours for headache Patient not taking: Reported on 01/06/2022 06/04/21   Pieter Partridge, DO  fluticasone (FLONASE) 50 MCG/ACT nasal spray Place 2 sprays into both nostrils daily. Patient not taking: Reported on 01/06/2022 02/12/21   Barrett Henle, MD   linaclotide [facility]) 145 MCG CAPS capsule Take 1 capsule (145 mcg total) by mouth daily before breakfast. Patient not taking: Reported on 12/08/2021 10/29/21 10/24/22  Fathima Bartl V, DO  ondansetron (ZOFRAN ODT) 4 MG disintegrating tablet Take 1 tablet (4 mg total) by mouth every 8 (eight) hours as needed for nausea or vomiting. Patient not taking: Reported on 01/06/2022 12/06/21   Pieter Partridge, DO  venlafaxine XR (EFFEXOR-XR) 37.5 MG 24 hr capsule TAKE 1 CAPSULE BY MOUTH DAILY WITH BREAKFAST. Patient not taking: Reported on 01/06/2022 01/03/22   Pieter Partridge, DO    Current Outpatient Medications  Medication Sig Dispense Refill   dicyclomine (BENTYL) 10 MG capsule Take 1 capsule (10 mg total) by mouth every 6 (six) hours as needed for spasms. 30 capsule 3   esomeprazole (NEXIUM) 40 MG capsule Take 1 capsule (40 mg total) by mouth 2 (two) times daily before a meal. 180 capsule 3   famotidine (PEPCID) 20 MG tablet Take 20 mg by mouth 2 (two) times daily.     SUMAtriptan ([name]) 20 MG/ACT nasal spray 1 spray in nostril.  May repeat x1 in 2 hours if headache persists or recurs. 10 each 5   albuterol (VENTOLIN HFA) 108 (90 Base) MCG/ACT inhaler Inhale 2 puffs into the lungs every 6 (six) hours as needed. (Patient not taking: Reported on 01/06/2022)     cetirizine (ZYRTEC) 10 MG tablet Take 10 mg by mouth daily. (Patient not taking: Reported on 01/06/2022)     cyclobenzaprine (FLEXERIL) 10 MG tablet May use every 8 hours for headache (Patient not taking: Reported on 01/06/2022) 90 tablet 5   fluticasone (FLONASE) 50 MCG/ACT nasal spray Place 2 sprays into both nostrils daily. (Patient not taking: Reported on 01/06/2022) 16 g 0   linaclotide (LINZESS) 145 MCG CAPS capsule Take 1 capsule (145 mcg total) by mouth daily before breakfast. (Patient not taking: Reported on 12/08/2021) 90 capsule 3   ondansetron (ZOFRAN ODT) 4 MG disintegrating tablet Take 1 tablet (4 mg total) by mouth every 8  (eight) hours as needed for nausea or vomiting. (Patient not taking: Reported on 01/06/2022) 5 tablet 0   venlafaxine XR (EFFEXOR-XR) 37.5 MG 24 hr capsule TAKE 1 CAPSULE BY MOUTH DAILY WITH BREAKFAST. (Patient not taking: Reported on 01/06/2022) 90 capsule 2   Current Facility-Administered Medications  Medication Dose Route Frequency Provider Last Rate Last Admin   0.9 %  sodium chloride infusion  500 mL Intravenous Once Ainslie Mazurek V, DO        Allergies as of 01/06/2022 - Review Complete 01/06/2022  Allergen Reaction Noted   Aimovig [erenumab-aooe]  10/25/2018   Doxycycline hyclate Hives, Itching, and Swelling 03/25/2008   Ketorolac tromethamine Other (See Comments) 12/13/2010   Amoxicillin Hives, Swelling, and Rash     Family History  Problem Relation Age of Onset  Hypertension Mother    Depression Mother    Depression Father    Breast cancer Maternal Aunt        Great Aunt   Cancer Maternal Aunt    Heart disease Maternal Grandmother    Diabetes Maternal Grandfather    Heart disease Maternal Grandfather    Hypertension Maternal Grandfather    Congestive Heart Failure Paternal Grandfather    Anesthesia problems Neg Hx    Colon polyps Neg Hx    Colon cancer Neg Hx    Esophageal cancer Neg Hx    Stomach cancer Neg Hx    Rectal cancer Neg Hx     Social History   Socioeconomic History   Marital status: Significant Other    Spouse name: Not on file   Number of children: 1   Years of education: 12+   Highest education level: Not on file  Occupational History   Not on file  Tobacco Use   Smoking status: Never   Smokeless tobacco: Never  Vaping Use   Vaping Use: Never used  Substance and Sexual Activity   Alcohol use: No    Alcohol/week: 0.0 standard drinks of alcohol   Drug use: No   Sexual activity: Yes    Partners: Male    Birth control/protection: Condom  Other Topics Concern   Not on file  Social History Narrative   Regular exercise-no   Caffeine  Use-yes   Right handed   Two story house   Social Determinants of Health   Financial Resource Strain: Low Risk  (01/31/2018)   Overall Financial Resource Strain (CARDIA)    Difficulty of Paying Living Expenses: Not hard at all  Food Insecurity: No Food Insecurity (01/31/2018)   Hunger Vital Sign    Worried About Running Out of Food in the Last Year: Never true    Ran Out of Food in the Last Year: Never true  Transportation Needs: Unknown (01/31/2018)   PRAPARE - Hydrologist (Medical): No    Lack of Transportation (Non-Medical): Not on file  Physical Activity: Not on file  Stress: Stress Concern Present (01/31/2018)   [place]    Feeling of Stress : Rather much  Social Connections: Not on file  Intimate Partner Violence: Not At Risk (01/31/2018)   Humiliation, Afraid, Rape, and Kick questionnaire    Fear of Current or Ex-Partner: No    Emotionally Abused: No    Physically Abused: No    Sexually Abused: No    Physical Exam: Vital signs in last 24 hours: '@BP'$  116/67   Pulse 78   Temp (!) 96.8 F (36 C) (Skin)   Ht '5\' 3"'$  (1.6 m)   Wt 258 lb (117 kg)   SpO2 100%   BMI 45.70 kg/m  GEN: NAD EYE: Sclerae anicteric ENT: MMM CV: Non-tachycardic Pulm: CTA b/l GI: Soft, NT/ND NEURO:  Alert & Oriented x 3   Gerrit Heck, DO [facility]   01/06/2022 3:08 PM

## 2022-01-06 NOTE — Op Note (Signed)
[facility] Patient Name: Amanda [name] Procedure Date: 01/06/2022 3:01 PM MRN: [id number] Endoscopist: Gerrit Heck , MD, [id number] Age: 41 Referring MD:  Date of Birth: 08-Jun-1980 Gender: Female Account #: [id number] Procedure:                Colonoscopy Indications:              Abdominal pain in the left upper quadrant, Change                            in bowel habits, Constipation with onset of                            episodic diarrhea Medicines:                Monitored Anesthesia Care Procedure:                Pre-Anesthesia Assessment:                           - Prior to the procedure, a History and Physical                            was performed, and patient medications and                            allergies were reviewed. The patient's tolerance of                            previous anesthesia was also reviewed. The risks                            and benefits of the procedure and the sedation                            options and risks were discussed with the patient.                            All questions were answered, and informed consent                            was obtained. Prior Anticoagulants: The patient has                            taken no anticoagulant or antiplatelet agents. ASA                            Grade Assessment: II - A patient with mild systemic                            disease. After reviewing the risks and benefits,                            the patient was deemed in satisfactory condition to  undergo the procedure.                           After obtaining informed consent, the colonoscope                            was passed under direct vision. Throughout the                            procedure, the patient's blood pressure, pulse, and                            oxygen saturations were monitored continuously. The                            Olympus CF-HQ190L [id number])  Colonoscope was                            introduced through the anus and advanced to the the                            cecum, identified by appendiceal orifice and                            ileocecal valve. The colonoscopy was performed                            without difficulty. The patient tolerated the                            procedure well. The quality of the bowel                            preparation was good. The ileocecal valve,                            appendiceal orifice, and rectum were photographed. Scope In: 3:25:45 PM Scope Out: 3:39:55 PM Scope Withdrawal Time: 0 hours 8 minutes 47 seconds  Total Procedure Duration: 0 hours 14 minutes 10 seconds  Findings:                 The perianal and digital rectal examinations were                            normal.                           A 3 mm polyp was found in the sigmoid colon. The                            polyp was sessile. The polyp was removed with a                            cold snare. Resection and retrieval were complete.  Estimated blood loss was minimal.                           Normal mucosa was found in the entire colon.                            Biopsies for histology were taken with a cold                            forceps from the right colon and left colon for                            evaluation of microscopic colitis. Estimated blood                            loss was minimal.                           Non-bleeding internal hemorrhoids were found during                            retroflexion. The hemorrhoids were small. Complications:            No immediate complications. Estimated Blood Loss:     Estimated blood loss was minimal. Impression:               - One 3 mm polyp in the sigmoid colon, removed with                            a cold snare. Resected and retrieved.                           - Normal mucosa in the entire examined colon.                             Biopsied.                           - Non-bleeding internal hemorrhoids. Recommendation:           - Patient has a contact number available for                            emergencies. The signs and symptoms of potential                            delayed complications were discussed with the                            patient. Return to normal activities tomorrow.                            Written discharge instructions were provided to the                            patient.                           -  Resume previous diet.                           - Continue present medications.                           - Await pathology results.                           - Repeat colonoscopy in 5-10 years for surveillance                            based on pathology results.                           - Return to GI clinic PRN.                           - Use fiber, for example Citrucel, Fibercon, Konsyl                            or Metamucil.                           - Internal hemorrhoids were noted on this study and                            may be amenable to hemorrhoid band ligation. If you                            are interested in further treatment of these                            hemorrhoids with band ligation, please contact my                            clinic to set up an appointment for evaluation and                            treatment. Gerrit Heck, MD 01/06/2022 3:48:51 PM

## 2022-01-06 NOTE — Progress Notes (Signed)
Sedate, gd SR, tolerated procedure well, VSS, report to RN 

## 2022-01-06 NOTE — Patient Instructions (Signed)
Handout on polyps given.  Resume previous diet. Continue present medications.  Use fiber, for example Citrucel, [place], Konsyl or metamucil.     YOU HAD AN ENDOSCOPIC PROCEDURE TODAY AT [facility]:   Refer to the procedure report that was given to you for any specific questions about what was found during the examination.  If the procedure report does not answer your questions, please call your gastroenterologist to clarify.  If you requested that your care partner not be given the details of your procedure findings, then the procedure report has been included in a sealed envelope for you to review at your convenience later.  YOU SHOULD EXPECT: Some feelings of bloating in the abdomen. Passage of more gas than usual.  Walking can help get rid of the air that was put into your GI tract during the procedure and reduce the bloating. If you had a lower endoscopy (such as a colonoscopy or flexible sigmoidoscopy) you may notice spotting of blood in your stool or on the toilet paper. If you underwent a bowel prep for your procedure, you may not have a normal bowel movement for a few days.  Please Note:  You might notice some irritation and congestion in your nose or some drainage.  This is from the oxygen used during your procedure.  There is no need for concern and it should clear up in a day or so.  SYMPTOMS TO REPORT IMMEDIATELY:  Following lower endoscopy (colonoscopy or flexible sigmoidoscopy):  Excessive amounts of blood in the stool  Significant tenderness or worsening of abdominal pains  Swelling of the abdomen that is new, acute  Fever of 100F or higher  Following upper endoscopy (EGD)  Vomiting of blood or coffee ground material  New chest pain or pain under the shoulder blades  Painful or persistently difficult swallowing  New shortness of breath  Fever of 100F or higher  Black, tarry-looking stools  For urgent or emergent issues, a gastroenterologist can be  reached at any hour by calling [phone]. Do not use MyChart messaging for urgent concerns.    DIET:  We do recommend a small meal at first, but then you may proceed to your regular diet.  Drink plenty of fluids but you should avoid alcoholic beverages for 24 hours.  ACTIVITY:  You should plan to take it easy for the rest of today and you should NOT DRIVE or use heavy machinery until tomorrow (because of the sedation medicines used during the test).    FOLLOW UP: Our staff will call the number listed on your records the next business day following your procedure.  We will call around 7:15- 8:00 am to check on you and address any questions or concerns that you may have regarding the information given to you following your procedure. If we do not reach you, we will leave a message.     If any biopsies were taken you will be contacted by phone or by letter within the next 1-3 weeks.  Please call us at [phone] if you have not heard about the biopsies in 3 weeks.    SIGNATURES/CONFIDENTIALITY: You and/or your care partner have signed paperwork which will be entered into your electronic medical record.  These signatures attest to the fact that that the information above on your After Visit Summary has been reviewed and is understood.  Full responsibility of the confidentiality of this discharge information lies with you and/or your care-partner.

## 2022-01-06 NOTE — Progress Notes (Signed)
Called to room to assist during endoscopic procedure.  Patient ID and intended procedure confirmed with present staff. Received instructions for my participation in the procedure from the performing physician.  

## 2022-01-07 ENCOUNTER — Telehealth: Payer: Self-pay

## 2022-01-07 NOTE — Telephone Encounter (Signed)
Left message on answering machine. 

## 2022-01-19 ENCOUNTER — Other Ambulatory Visit: Payer: Self-pay

## 2022-01-20 ENCOUNTER — Other Ambulatory Visit: Payer: Self-pay

## 2022-01-27 ENCOUNTER — Other Ambulatory Visit: Payer: Self-pay

## 2022-01-27 DIAGNOSIS — B9681 Helicobacter pylori [H. pylori] as the cause of diseases classified elsewhere: Secondary | ICD-10-CM

## 2022-02-09 ENCOUNTER — Other Ambulatory Visit: Payer: Self-pay

## 2022-02-09 ENCOUNTER — Encounter: Payer: Self-pay | Admitting: Internal Medicine

## 2022-02-09 ENCOUNTER — Ambulatory Visit (INDEPENDENT_AMBULATORY_CARE_PROVIDER_SITE_OTHER): Payer: Medicaid Other | Admitting: Internal Medicine

## 2022-02-09 VITALS — BP 123/84 | HR 86 | Temp 98.6°F | Ht 63.0 in | Wt 269.0 lb

## 2022-02-09 DIAGNOSIS — A048 Other specified bacterial intestinal infections: Secondary | ICD-10-CM | POA: Diagnosis present

## 2022-02-09 MED ORDER — LEVOFLOXACIN 500 MG PO TABS: 500.0000 mg | ORAL_TABLET | Freq: Every day | ORAL | 0 refills | Status: DC

## 2022-02-09 MED ORDER — FLUCONAZOLE 150 MG PO TABS: 150.0000 mg | ORAL_TABLET | Freq: Once | ORAL | 0 refills | Status: AC

## 2022-02-09 MED ORDER — AMOXICILLIN 250 MG PO CAPS: 750.0000 mg | ORAL_CAPSULE | Freq: Three times a day (TID) | ORAL | 0 refills | Status: DC

## 2022-02-09 NOTE — Progress Notes (Signed)
[name]      Reason for Consult: H pylori infection    Referring Physician: Dr. Bryan Lemma    Patient ID: Amanda [name], female    DOB: [birth year], 42 y.o.   MRN: [id number]  HPI:   Amanda [name] is here for evaluation for H pylori infection She is followed by GI and noted GI symptoms that started over 10 years ago.  She equates this with an episode of food poisoning she had from eating at a [employer] at that time. Since then she has had continued issues with bloating, belching, reflux and has had no relief.  She was previously followed elsewhere and did get treatment for H pylori then but does not recall what regimen she was given. She did not have any change in her symptoms or resolution of follow up H pylori testing. She did recall clarithromycin.   Past Medical History:  Diagnosis Date   Anginal pain ([place])    r/t anxiety   Anxiety    on meds   Asthma    no inhaler - seasonal   Constipation    Depression    on meds   Dysrhythmia    Palpatations - r/t anxiety   Genital warts    GERD (gastroesophageal reflux disease)    on meds   H/O hiatal hernia    surgery to repair   Headache(784.0)    Heart murmur    History of low transverse cesarean section 02/12/2018   MRSA cellulitis    greater than 10 yrs ago dx at urgent care    Seasonal allergies    Sickle cell trait ([place])     Prior to Admission medications   Medication Sig Start Date End Date Taking? Authorizing Provider  albuterol (VENTOLIN HFA) 108 (90 Base) MCG/ACT inhaler Inhale 2 puffs into the lungs every 6 (six) hours as needed. Patient not taking: Reported on 01/06/2022 10/18/18   [provider]  cetirizine (ZYRTEC) 10 MG tablet Take 10 mg by mouth daily. Patient not taking: Reported on 01/06/2022 10/18/18   [provider]  cyclobenzaprine (FLEXERIL) 10 MG tablet May use every 8 hours for headache Patient not taking: Reported on 01/06/2022 06/04/21   Pieter Partridge, DO  dicyclomine (BENTYL) 10 MG capsule Take 1 capsule (10 mg total) by mouth every 6 (six) hours as needed for spasms. 10/29/21   Cirigliano, Vito V, DO  esomeprazole (NEXIUM) 40 MG capsule Take 1 capsule (40 mg total) by mouth 2 (two) times daily before a meal. 10/29/21   Cirigliano, Vito V, DO  famotidine (PEPCID) 20 MG tablet Take 20 mg by mouth 2 (two) times daily. 03/16/21   [provider]  fluticasone (FLONASE) 50 MCG/ACT nasal spray Place 2 sprays into both nostrils daily. Patient not taking: Reported on 01/06/2022 02/12/21   Barrett Henle, MD  linaclotide [facility]) 145 MCG CAPS capsule Take 1 capsule (145 mcg total) by mouth daily before breakfast. Patient not taking: Reported on 12/08/2021 10/29/21 10/24/22  Cirigliano, Vito V, DO  ondansetron (ZOFRAN ODT) 4 MG disintegrating tablet Take 1 tablet (4 mg total) by mouth every 8 (eight) hours as needed for nausea or vomiting. Patient not taking: Reported on 01/06/2022 12/06/21   Pieter Partridge, DO  SUMAtriptan ([name]) 20 MG/ACT nasal spray 1 spray in nostril.  May repeat x1 in 2 hours if headache persists or recurs. 12/06/21   Tomi Likens, Adam R, DO  venlafaxine XR (EFFEXOR-XR) 37.5 MG 24 hr  capsule TAKE 1 CAPSULE BY MOUTH DAILY WITH BREAKFAST. Patient not taking: Reported on 01/06/2022 01/03/22   Pieter Partridge, DO    Allergies  Allergen Reactions   Aimovig [Erenumab-Aooe]     Hot/lightheaded/nausea and vomiting   Doxycycline Hyclate Hives, Itching and Swelling   Ketorolac Tromethamine Other (See Comments)    Patient stated that it makes her extremely hyper   Amoxicillin Hives, Swelling and Rash    Has patient had a PCN reaction causing immediate rash, facial/tongue/throat swelling, SOB or lightheadedness with hypotension: Unsure Has patient had a PCN reaction causing severe rash involving mucus membranes or skin necrosis: Unsure Has patient had a PCN reaction that required hospitalization No Has patient had a PCN  reaction occurring within the last 10 years: No If all of the above answers are "NO", then may proceed with Cephalosporin use.REACTION: facial swelling and rash.    Social History   Tobacco Use   Smoking status: Never   Smokeless tobacco: Never  Vaping Use   Vaping Use: Never used  Substance Use Topics   Alcohol use: No    Alcohol/week: 0.0 standard drinks of alcohol   Drug use: No    Family History  Problem Relation Age of Onset   Hypertension Mother    Depression Mother    Depression Father    Breast cancer Maternal Aunt        Great Aunt   Cancer Maternal Aunt    Heart disease Maternal Grandmother    Diabetes Maternal Grandfather    Heart disease Maternal Grandfather    Hypertension Maternal Grandfather    Congestive Heart Failure Paternal Grandfather    Anesthesia problems Neg Hx    Colon polyps Neg Hx    Colon cancer Neg Hx    Esophageal cancer Neg Hx    Stomach cancer Neg Hx    Rectal cancer Neg Hx     Review of Systems  Constitutional: negative for fatigue and malaise All other systems reviewed and are negative    Constitutional: in no apparent distress There were no vitals filed for this visit. EYES: anicteric Respiratory: normal respiratory effort GI: nt  Labs: Lab Results  Component Value Date   WBC 7.5 10/09/2020   HGB 11.7 (L) 10/09/2020   HCT 36.0 10/09/2020   MCV 78.8 (L) 10/09/2020   PLT 181 10/09/2020    Lab Results  Component Value Date   CREATININE 0.72 10/09/2020   BUN 7 10/09/2020   NA 140 10/09/2020   K 4.1 10/09/2020   CL 104 10/09/2020   CO2 26 10/09/2020    Lab Results  Component Value Date   ALT 13 10/09/2020   AST 16 10/09/2020   ALKPHOS 54 10/09/2020   BILITOT 0.7 10/09/2020     Assessment: H pylori infection associated with gastritis.  Most likely, she has been on a clarithromycin-based regiment so I suspect resistance.   She does have allergies to doxycycline listed and remembers hives. She did have some itching  with amoxicillin but had tolerated it in the past.  I discussed options and since she has had amoxicillin previously and no significant reaction with hives or throat closing, will start an amoxicillin-based regimen  Plan: 1)  amoxicillin 750 mg three times a day + levafloxacin 500 mg daily + high dose ppi x 14 days Follow up in 2 weeks to check for improvement and if improved, will have her stop and check a breath test 2 weeks later If no improvement, will  consider a rifabutin-based regimen  2) also will prescribe flucnoazole in case she gets a yeast infection

## 2022-02-09 NOTE — Patient Instructions (Addendum)
Take amoxicillin 3 pills three times a day + levaquin 500 mg once daily along with the esomeprazol (Nexium) that you are already on.

## 2022-02-23 ENCOUNTER — Other Ambulatory Visit: Payer: Self-pay

## 2022-02-23 ENCOUNTER — Encounter: Payer: Self-pay | Admitting: Internal Medicine

## 2022-02-23 ENCOUNTER — Ambulatory Visit: Payer: Medicaid Other | Admitting: Internal Medicine

## 2022-02-23 VITALS — BP 115/77 | HR 74 | Temp 97.6°F | Ht 63.0 in | Wt 265.0 lb

## 2022-02-23 DIAGNOSIS — A048 Other specified bacterial intestinal infections: Secondary | ICD-10-CM | POA: Diagnosis present

## 2022-02-23 DIAGNOSIS — Z889 Allergy status to unspecified drugs, medicaments and biological substances status: Secondary | ICD-10-CM | POA: Diagnosis not present

## 2022-02-23 NOTE — Assessment & Plan Note (Signed)
She feels much better now on treatment for just one week.  At this point I will have her continue to complete 14 days including with Nexium and then stop.  I will have her do a breath test 7 days after stopping.  She can follow up with me after the breath test for results and reevaluation.  If negative and she is doing well, she can cancel the appointment with me.

## 2022-02-23 NOTE — Progress Notes (Signed)
   Subjective:    Patient ID: Amanda [name], female    DOB: 1980-09-03, 42 y.o.   MRN: [id number]  HPI Amanda [name] is here for follow up on H pylori. She started treatment about 1 week ago with amoxicillin 750 mg three times a day + levaquin 500 mg daily.  She has not been taking the nexium regularly though as recommended.  She though feels much better and different than she had previously with treatment.  She is pleased with the result so far.  Some minimal itching but has tolerated amoxicillin well.    Review of Systems  Constitutional:  Negative for fatigue.  Gastrointestinal:  Negative for diarrhea and nausea.  Skin:  Negative for rash.       Objective:   Physical Exam Eyes:     General: No scleral icterus. Pulmonary:     Effort: Pulmonary effort is normal.  Skin:    Findings: No rash.  Neurological:     Mental Status: She is alert.  Psychiatric:        Mood and Affect: Mood normal.           Assessment & Plan:

## 2022-02-23 NOTE — Patient Instructions (Signed)
Take the Nexium twice a day along with the antibiotics until you are done with the antibiotics and then stop. We will do the H pylori test 1 week after you stop the antibiotics

## 2022-02-23 NOTE — Assessment & Plan Note (Signed)
She has tolerated amoxicillin well so I have delisted this from her allergy list.    I have personally spent 30 minutes involved in face-to-face and non-face-to-face activities for this patient on the day of the visit. Professional time spent includes the following activities: Preparing to see the patient (review of tests), Obtaining and/or reviewing separately obtained history (admission/discharge record), Performing a medically appropriate examination and/or evaluation , Ordering medications/tests/procedures, referring and communicating with other health care professionals, Documenting clinical information in the EMR, Independently interpreting results (not separately reported), Communicating results to the patient/family/caregiver, Counseling and educating the patient/family/caregiver and Care coordination (not separately reported).

## 2022-03-09 ENCOUNTER — Other Ambulatory Visit: Payer: Self-pay

## 2022-03-09 ENCOUNTER — Telehealth: Payer: Self-pay

## 2022-03-09 ENCOUNTER — Other Ambulatory Visit: Payer: Medicaid Other

## 2022-03-09 DIAGNOSIS — A048 Other specified bacterial intestinal infections: Secondary | ICD-10-CM

## 2022-03-09 NOTE — Telephone Encounter (Signed)
-----   Message from [facility] sent at 03/09/2022  8:28 AM EST ----- Patient came in for lab appointment, asked to send a message to Dr. Linus Salmons about stomach issues returning. Has finished medication, wanting to know if she can take anything else. Best contact number [phone]

## 2022-03-09 NOTE — Telephone Encounter (Signed)
Spoke with patient regarding concerns. States for the past two days she has been having loose stool. Is not taking anything to help with this at the moment.  Would like recommendations from provider on what to do.  Leatrice Jewels, RMA

## 2022-03-10 NOTE — Telephone Encounter (Signed)
Called patient and relayed provider's response. Verbalized understanding. Will call if she starts to feel worse. Is feeling about the same today regarding bloating and upset stomach.  Leatrice Jewels, RMA

## 2022-03-14 LAB — H. PYLORI BREATH TEST: H. pylori Breath Test: NOT DETECTED

## 2022-03-15 ENCOUNTER — Telehealth: Payer: Self-pay

## 2022-03-15 NOTE — Telephone Encounter (Signed)
-----   Message from Thayer Headings, MD sent at 03/15/2022  9:27 AM EST ----- Her H pylori test is now negative, which is great news!  The treatment seems to have worked.   Let her know she can restart the Nexium if she feels she needs to now that the test is done and can cancel the follow up with me, which was to discuss further steps if positive again.   She can follow up with GI for her GERD or any stomach issues.  thanks

## 2022-03-15 NOTE — Telephone Encounter (Signed)
Patient returned call regarding lab results. Does not have any questions at this time. Will follow up with GI. Leatrice Jewels, RMA

## 2022-03-15 NOTE — Telephone Encounter (Signed)
Attempted to reach patient regarding results and follow up appointment (not needed). If patient returns call please cancel follow up and transfer to triage.   Eugenia Mcalpine, LPN

## 2022-03-22 ENCOUNTER — Emergency Department (HOSPITAL_COMMUNITY)
Admission: EM | Admit: 2022-03-22 | Discharge: 2022-03-22 | Discharge status: Against Medical Advice | Payer: Medicaid Other | Attending: Emergency Medicine | Admitting: Emergency Medicine

## 2022-03-22 ENCOUNTER — Emergency Department (HOSPITAL_COMMUNITY): Payer: Medicaid Other

## 2022-03-22 ENCOUNTER — Encounter (HOSPITAL_COMMUNITY): Payer: Self-pay | Admitting: Emergency Medicine

## 2022-03-22 DIAGNOSIS — R197 Diarrhea, unspecified: Secondary | ICD-10-CM | POA: Diagnosis not present

## 2022-03-22 DIAGNOSIS — Z20822 Contact with and (suspected) exposure to covid-19: Secondary | ICD-10-CM | POA: Insufficient documentation

## 2022-03-22 DIAGNOSIS — R519 Headache, unspecified: Secondary | ICD-10-CM | POA: Insufficient documentation

## 2022-03-22 DIAGNOSIS — R112 Nausea with vomiting, unspecified: Secondary | ICD-10-CM | POA: Diagnosis present

## 2022-03-22 DIAGNOSIS — R1030 Lower abdominal pain, unspecified: Secondary | ICD-10-CM | POA: Diagnosis not present

## 2022-03-22 DIAGNOSIS — R1013 Epigastric pain: Secondary | ICD-10-CM | POA: Insufficient documentation

## 2022-03-22 LAB — RESP PANEL BY RT-PCR (RSV, FLU A&B, COVID)  RVPGX2
Influenza A by PCR: NEGATIVE
Influenza B by PCR: NEGATIVE
Resp Syncytial Virus by PCR: NEGATIVE
SARS Coronavirus 2 by RT PCR: NEGATIVE

## 2022-03-22 LAB — URINALYSIS, ROUTINE W REFLEX MICROSCOPIC
Bilirubin Urine: NEGATIVE
Glucose, UA: NEGATIVE mg/dL
Hgb urine dipstick: NEGATIVE
Ketones, ur: NEGATIVE mg/dL
Leukocytes,Ua: NEGATIVE
Nitrite: NEGATIVE
Protein, ur: NEGATIVE mg/dL
Specific Gravity, Urine: 1.01 (ref 1.005–1.030)
pH: 7 (ref 5.0–8.0)

## 2022-03-22 LAB — CBC WITH DIFFERENTIAL/PLATELET
Abs Immature Granulocytes: 0.04 10*3/uL (ref 0.00–0.07)
Basophils Absolute: 0 10*3/uL (ref 0.0–0.1)
Basophils Relative: 0 %
Eosinophils Absolute: 0.1 10*3/uL (ref 0.0–0.5)
Eosinophils Relative: 1 %
HCT: 39.2 % (ref 36.0–46.0)
Hemoglobin: 12.5 g/dL (ref 12.0–15.0)
Immature Granulocytes: 0 %
Lymphocytes Relative: 9 %
Lymphs Abs: 1 10*3/uL (ref 0.7–4.0)
MCH: 24.6 pg — ABNORMAL LOW (ref 26.0–34.0)
MCHC: 31.9 g/dL (ref 30.0–36.0)
MCV: 77.2 fL — ABNORMAL LOW (ref 80.0–100.0)
Monocytes Absolute: 0.5 10*3/uL (ref 0.1–1.0)
Monocytes Relative: 5 %
Neutro Abs: 9.2 10*3/uL — ABNORMAL HIGH (ref 1.7–7.7)
Neutrophils Relative %: 85 %
Platelets: 230 10*3/uL (ref 150–400)
RBC: 5.08 MIL/uL (ref 3.87–5.11)
RDW: 15.8 % — ABNORMAL HIGH (ref 11.5–15.5)
WBC: 10.9 10*3/uL — ABNORMAL HIGH (ref 4.0–10.5)
nRBC: 0 % (ref 0.0–0.2)

## 2022-03-22 LAB — COMPREHENSIVE METABOLIC PANEL
ALT: 25 U/L (ref 0–44)
AST: 26 U/L (ref 15–41)
Albumin: 3.6 g/dL (ref 3.5–5.0)
Alkaline Phosphatase: 66 U/L (ref 38–126)
Anion gap: 13 (ref 5–15)
BUN: 12 mg/dL (ref 6–20)
CO2: 22 mmol/L (ref 22–32)
Calcium: 9.1 mg/dL (ref 8.9–10.3)
Chloride: 103 mmol/L (ref 98–111)
Creatinine, Ser: 0.76 mg/dL (ref 0.44–1.00)
GFR, Estimated: >60 mL/min (ref >60)
Glucose, Bld: 129 mg/dL — ABNORMAL HIGH (ref 70–99)
Potassium: 3.8 mmol/L (ref 3.5–5.1)
Sodium: 138 mmol/L (ref 135–145)
Total Bilirubin: 0.6 mg/dL (ref 0.3–1.2)
Total Protein: 7.2 g/dL (ref 6.5–8.1)

## 2022-03-22 LAB — LIPASE, BLOOD: Lipase: 37 U/L (ref 11–51)

## 2022-03-22 LAB — I-STAT BETA HCG BLOOD, ED (MC, WL, AP ONLY): I-stat hCG, quantitative: <5 m[IU]/mL (ref <5)

## 2022-03-22 MED ORDER — IOHEXOL 350 MG/ML SOLN
75.0000 mL | Freq: Once | INTRAVENOUS | Status: AC | PRN
  Administered 2022-03-22: 75 mL via INTRAVENOUS

## 2022-03-22 MED ORDER — LACTATED RINGERS IV BOLUS
1000.0000 mL | Freq: Once | INTRAVENOUS | Status: AC
  Administered 2022-03-22: 1000 mL via INTRAVENOUS

## 2022-03-22 MED ORDER — FENTANYL CITRATE PF 50 MCG/ML IJ SOSY
12.5000 ug | PREFILLED_SYRINGE | Freq: Once | INTRAMUSCULAR | Status: AC
  Administered 2022-03-22: 12.5 ug via INTRAVENOUS
  Filled 2022-03-22: qty 1

## 2022-03-22 MED ORDER — SODIUM CHLORIDE 0.9 % IV SOLN
25.0000 mg | Freq: Four times a day (QID) | INTRAVENOUS | Status: DC | PRN
  Administered 2022-03-22: 25 mg via INTRAVENOUS
  Filled 2022-03-22: qty 1

## 2022-03-22 MED ORDER — ONDANSETRON HCL 4 MG/2ML IJ SOLN
4.0000 mg | Freq: Once | INTRAMUSCULAR | Status: AC
  Administered 2022-03-22: 4 mg via INTRAVENOUS
  Filled 2022-03-22: qty 2

## 2022-03-22 MED ORDER — PROCHLORPERAZINE EDISYLATE 10 MG/2ML IJ SOLN
10.0000 mg | INTRAMUSCULAR | Status: AC
  Administered 2022-03-22: 10 mg via INTRAVENOUS
  Filled 2022-03-22: qty 2

## 2022-03-22 MED ORDER — SODIUM CHLORIDE 0.9 % IV BOLUS
1000.0000 mL | Freq: Once | INTRAVENOUS | Status: AC
  Administered 2022-03-22: 1000 mL via INTRAVENOUS

## 2022-03-22 NOTE — ED Notes (Signed)
When staff went to check on Pt, she was gone and all belongings were gone.  Staff have tried to call her a few times w/o an answer.  VM left asking Pt to call back to let us know she is ok.

## 2022-03-22 NOTE — ED Notes (Signed)
ED Provider at bedside. 

## 2022-03-22 NOTE — ED Provider Notes (Signed)
[facility] Provider Note   CSN: [id number] Arrival date & time: 03/22/22  0447     History  Chief Complaint  Patient presents with   Abdominal Pain    Amanda [name] is a 42 y.o. female.  HPI 42 year old female history of anxiety, constipation, GERD, hiatal hernia, H. pylori infection, daily headaches, presents today complaining of nausea vomiting and diarrhea for the past 2 days.  She states the symptoms began around 4:30 in the morning on Sunday after leaving the hospital with her son here for constipation.  She has not had fever or chills.  She denies UTI symptoms.  She states that she has regular periods and has had bilateral oophorectomy.  Has taken acetaminophen and ibuprofen over-the-counter that has helped her headaches somewhat.  She states that she gets daily headaches but this headache with the nausea vomiting diarrhea is different and is "a sick" headache.  No nasal congestion, sore throat, cough, neck pain.    Home Medications Prior to Admission medications   Medication Sig Start Date End Date Taking? Authorizing Provider  albuterol (VENTOLIN HFA) 108 (90 Base) MCG/ACT inhaler Inhale 2 puffs into the lungs every 6 (six) hours as needed. Patient not taking: Reported on 01/06/2022 10/18/18   [provider]  amoxicillin (AMOXIL) 250 MG capsule Take 3 capsules (750 mg total) by mouth 3 (three) times daily. 02/09/22   Thayer Headings, MD  cetirizine (ZYRTEC) 10 MG tablet Take 10 mg by mouth daily. Patient not taking: Reported on 01/06/2022 10/18/18   [provider]  cyclobenzaprine (FLEXERIL) 10 MG tablet May use every 8 hours for headache Patient not taking: Reported on 01/06/2022 06/04/21   Pieter Partridge, DO  dicyclomine (BENTYL) 10 MG capsule Take 1 capsule (10 mg total) by mouth every 6 (six) hours as needed for spasms. 10/29/21   Cirigliano, Vito V, DO  esomeprazole (NEXIUM) 40 MG capsule Take 1 capsule  (40 mg total) by mouth 2 (two) times daily before a meal. 10/29/21   Cirigliano, Vito V, DO  famotidine (PEPCID) 20 MG tablet Take 20 mg by mouth 2 (two) times daily. 03/16/21   [provider]  fluticasone (FLONASE) 50 MCG/ACT nasal spray Place 2 sprays into both nostrils daily. Patient not taking: Reported on 01/06/2022 02/12/21   Barrett Henle, MD  levofloxacin (LEVAQUIN) 500 MG tablet Take 1 tablet (500 mg total) by mouth daily. 02/09/22   Thayer Headings, MD  linaclotide [facility]) 145 MCG CAPS capsule Take 1 capsule (145 mcg total) by mouth daily before breakfast. Patient not taking: Reported on 12/08/2021 10/29/21 10/24/22  Cirigliano, Vito V, DO  ondansetron (ZOFRAN ODT) 4 MG disintegrating tablet Take 1 tablet (4 mg total) by mouth every 8 (eight) hours as needed for nausea or vomiting. Patient not taking: Reported on 01/06/2022 12/06/21   Pieter Partridge, DO  SUMAtriptan ([name]) 20 MG/ACT nasal spray 1 spray in nostril.  May repeat x1 in 2 hours if headache persists or recurs. 12/06/21   Tomi Likens, Adam R, DO  venlafaxine XR (EFFEXOR-XR) 37.5 MG 24 hr capsule TAKE 1 CAPSULE BY MOUTH DAILY WITH BREAKFAST. Patient not taking: Reported on 01/06/2022 01/03/22   Pieter Partridge, DO      Allergies    Aimovig [erenumab-aooe], Doxycycline hyclate, and Ketorolac tromethamine    Review of Systems   Review of Systems  Physical Exam Updated Vital Signs BP 113/71   Pulse 80   Temp 99.5  F (37.5 C)   Resp 10   SpO2 98%  Physical Exam Vitals and nursing note reviewed.  Constitutional:      Appearance: She is well-developed.  HENT:     Head: Normocephalic and atraumatic.     Mouth/Throat:     Mouth: Mucous membranes are moist.  Eyes:     Extraocular Movements: Extraocular movements intact.  Cardiovascular:     Rate and Rhythm: Normal rate and regular rhythm.     Heart sounds: Normal heart sounds.  Pulmonary:     Effort: Pulmonary effort is normal.     Breath sounds: Normal  breath sounds.  Abdominal:     General: Abdomen is flat. Bowel sounds are normal.     Palpations: Abdomen is soft.     Tenderness: There is abdominal tenderness in the epigastric area and suprapubic area.  Skin:    General: Skin is warm and dry.     Capillary Refill: Capillary refill takes less than 2 seconds.  Neurological:     General: No focal deficit present.     Mental Status: She is alert.  Psychiatric:        Mood and Affect: Mood normal.        Behavior: Behavior normal.     ED Results / Procedures / Treatments   Labs (all labs ordered are listed, but only abnormal results are displayed) Labs Reviewed  CBC WITH DIFFERENTIAL/PLATELET - Abnormal; Notable for the following components:      Result Value   WBC 10.9 (*)    MCV 77.2 (*)    MCH 24.6 (*)    RDW 15.8 (*)    Neutro Abs 9.2 (*)    All other components within normal limits  COMPREHENSIVE METABOLIC PANEL - Abnormal; Notable for the following components:   Glucose, Bld 129 (*)    All other components within normal limits  RESP PANEL BY RT-PCR (RSV, FLU A&B, COVID)  RVPGX2  LIPASE, BLOOD  URINALYSIS, ROUTINE W REFLEX MICROSCOPIC  I-STAT BETA HCG BLOOD, ED ([facility], [facility], [facility] ONLY)    EKG None  Radiology CT ABDOMEN PELVIS W CONTRAST  Result Date: 03/22/2022 CLINICAL DATA:  Nausea, vomiting, abdominal pain EXAM: CT ABDOMEN AND PELVIS WITH CONTRAST TECHNIQUE: Multidetector CT imaging of the abdomen and pelvis was performed using the standard protocol following bolus administration of intravenous contrast. RADIATION DOSE REDUCTION: This exam was performed according to the departmental dose-optimization program which includes automated exposure control, adjustment of the mA and/or kV according to patient size and/or use of iterative reconstruction technique. CONTRAST:  47m OMNIPAQUE IOHEXOL 350 MG/ML SOLN COMPARISON:  CT abdomen/pelvis 08/08/2016 FINDINGS: Lower chest: The lung bases are clear. The imaged heart is  unremarkable. Hepatobiliary: The liver and gallbladder are unremarkable. There is no biliary ductal dilatation. Pancreas: Unremarkable. Spleen: Unremarkable. Adrenals/Urinary Tract: The adrenals are unremarkable. The kidneys are unremarkable, with no focal lesion, stone, hydronephrosis, or hydroureter. The bladder is unremarkable. Stomach/Bowel: The stomach is unremarkable. There is no evidence of bowel obstruction. There is no abnormal bowel wall thickening or inflammatory change. The tentatively identified appendix is normal. There is no pericecal inflammatory change. Vascular/Lymphatic: The abdominal aorta is normal in course and caliber. The major branch vessels are patent. The main portal and splenic veins are patent. There is no abdominal or pelvic lymphadenopathy. Reproductive: The uterus and adnexa are unremarkable. Other: There is no ascites or free air. There is a small fat containing umbilical hernia. Musculoskeletal: There is no acute osseous abnormality  or suspicious osseous lesion. IMPRESSION: No acute finding in the abdomen or pelvis to explain the patient's pain. Electronically Signed   By: Valetta Mole M.D.   On: 03/22/2022 11:33    Procedures Procedures    Medications Ordered in ED Medications  promethazine (PHENERGAN) 25 mg in sodium chloride 0.9 % 50 mL IVPB (0 mg Intravenous Stopped 03/22/22 0833)  lactated ringers bolus 1,000 mL (0 mLs Intravenous Stopped 03/22/22 0811)  ondansetron (ZOFRAN) injection 4 mg (4 mg Intravenous Given 03/22/22 0649)  sodium chloride 0.9 % bolus 1,000 mL (0 mLs Intravenous Stopped 03/22/22 0935)  fentaNYL (SUBLIMAZE) injection 12.5 mcg (12.5 mcg Intravenous Given 03/22/22 0941)  prochlorperazine (COMPAZINE) injection 10 mg (10 mg Intravenous Given 03/22/22 1144)  iohexol (OMNIPAQUE) 350 MG/ML injection 75 mL (75 mLs Intravenous Contrast Given 03/22/22 1121)    ED Course/ Medical Decision Making/ A&P Clinical Course as of 03/22/22 1217  Tue Mar 22, 2022   0923 Patient resting at this time.  She has not vomited recently but continues to be nauseated.  Plan oral fluid trial [DR]    Clinical Course User Index [DR] Pattricia Boss, MD                             Medical Decision Making Amount and/or Complexity of Data Reviewed Radiology: ordered.  Risk Prescription drug management.   42 year old female presents today with nausea vomiting diarrhea, headache.  On exam vital signs are stable.  She has mild epigastric and suprapubic tenderness which she thinks is secondary to the vomiting into diarrhea.  He has not noted any blood or bile.  She has not had fever.  Her child has some illness. Differential diagnosis includes but is not limited to Viral syndrome Other diarrhea infectious etiologies Small bowel obstruction doubt as she is having frequent diarrhea.  Her abdomen is soft and there are no hyperactive or decreased bowel sounds UTI-doubt patient is not having urinary frequency or pain Intra-abdominal etiologies including appendicitis, cholecystitis, gastritis,colitis and other intraabdominal etiology  Went to give patient results of CT scan and reevaluate and she has left.  Nurse reports that she not say anything and found her room empty       Final Clinical Impression(s) / ED Diagnoses Final diagnoses:  Nausea vomiting and diarrhea    Rx / DC Orders ED Discharge Orders     None         Pattricia Boss, MD 03/22/22 1217

## 2022-03-22 NOTE — ED Notes (Signed)
Pt found to be vomiting again.  EDP made aware.

## 2022-03-22 NOTE — ED Notes (Signed)
Pt noted to be walking to restroom w/ belongings when asked if she needed help, Pt sts "I'm just going to the bathroom."

## 2022-03-22 NOTE — ED Notes (Signed)
Patient transported to CT 

## 2022-03-22 NOTE — ED Notes (Signed)
Pt provided a small cup of ice chips.

## 2022-03-22 NOTE — ED Provider Triage Note (Signed)
Emergency Medicine Provider Triage Evaluation Note  JUNNE FESSENDEN , a 42 y.o. female  was evaluated in triage.  Pt complains of with nausea vomiting abdominal pain since Sunday, tearful in triage.  Young child ill with the same.  No chest pain shortness of breath fevers or chills.  Review of Systems  Positive: As above Negative: As above  Physical Exam  BP 124/82 (BP Location: Right Arm)   Pulse 82   Temp 98.5 F (36.9 C) (Oral)   Resp 20   SpO2 97%  Gen:   Awake, no distress   Resp:  Normal effort  MSK:   Moves extremities without difficulty  Other:  Lungs CTAB, RRR no setters/D.  Abdomen soft nondistended nontender  Medical Decision Making  Medically screening exam initiated at 5:10 AM.  Appropriate orders placed.  MARKIAH SOULSBY was informed that the remainder of the evaluation will be completed by another provider, this initial triage assessment does not replace that evaluation, and the importance of remaining in the ED until their evaluation is complete.  This chart was dictated using voice recognition software, Dragon. Despite the best efforts of this provider to proofread and correct errors, errors may still occur which can change documentation meaning.    Emeline Darling, PA-C 03/22/22 [phone]

## 2022-03-22 NOTE — ED Notes (Signed)
Pt provided w/ ice pack and warm blanket per request.  PRN Phenergan requested from pharmacy.

## 2022-03-22 NOTE — ED Triage Notes (Signed)
Pt reports n/v/d and abdominal pains since Sunday.  Pt very tearful in triage.

## 2022-03-22 NOTE — ED Notes (Signed)
This Probation officer offered Pt PO challenge, Pt reports her belly is hurting "really bad."  EDP made aware.

## 2022-03-23 ENCOUNTER — Ambulatory Visit: Payer: Medicaid Other | Admitting: Internal Medicine

## 2022-05-09 ENCOUNTER — Telehealth: Payer: Medicaid Other | Admitting: Neurology

## 2022-05-27 NOTE — Progress Notes (Unsigned)
Virtual Visit via Video Note  Consent was obtained for video visit:  Yes.   Answered questions that patient had about telehealth interaction:  Yes.   I discussed the limitations, risks, security and privacy concerns of performing an evaluation and management service by telemedicine. I also discussed with the patient that there may be a patient responsible charge related to this service. The patient expressed understanding and agreed to proceed.  Pt location: Home Physician Location: office Name of referring provider:  Norm Salt, PA I connected with Amanda [name] at patients initiation/request on 05/30/2022 at  2:50 PM EDT by video enabled telemedicine application and verified that I am speaking with the correct person using two identifiers. Pt MRN:  [id number] Pt DOB:  05/07/1980 Video Participants:  Amanda [name]  Assessment/Plan:     Migraine without aura, without status migrainosus, not intractable - aggravated by underlying emotional stress Left upper extremity pain and numbness - suspicious for C8 radiculopathy vs ulnar neuropathy   Migraine prevention:  Start venlafaxine XR 37.5mg  daily Migraine rescue:  Sumatriptan NS or Midol, Flexeril.  Zofran for nausea NCV-EMG left upper extremity Limit use of pain relievers to no more than 2 days out of week to prevent risk of rebound or medication-overuse headache. Keep headache diary Follow up 6 months.     Subjective:  Amanda [name] is a 42 year old right-handed woman with asthma, depression, anxiety and morbid obesity who follows up for chronic migraine.     UPDATE: Started venlafaxine but she never started it.  Wasn't sure which medication to start. Intensity:  moderate Duration:  60 minutes and nap with sumatriptan Frequency:  15 headache days in April (4 were severe)  Now  having pain and numbness in the last 2 fingers of her left hand radiating up the ulnar aspect of her forearm and  lateral arm to the shoulder but the neck.  She goes to the chiropractor.    Rescue protocol:  sumatriptan NS Current NSAIDS:  none Current analgesics:  none Current triptans:  sumatriptan 20mg  NS Current ergotamine:  none Current anti-emetic:  Zofran ODT 4mg  Current muscle relaxants:  Flexeril 10mg  TID PRN headACHE Current anti-anxiolytic:  none Current sleep aide:  none Current Antihypertensive medications:  none Current Antidepressant medications:  none Current Anticonvulsant medications:  gabapentin 300mg  PRN Current anti-CGRP: none Current Vitamins/Herbal/Supplements:  prenatal Current Antihistamines/Decongestants:  Flonase, Zyrtec Other therapy:  chiropractic medicine Hormone/birth control:  none   Caffeine:  no Alcohol:  no Smoker:  no Diet:  Cut out pork, cheese, chocolate.  Increased water intake Exercise:  walking Depression/stress:  stress Sleep hygiene:  poor   HISTORY: Onset:  adolescence Location:  Holocephalic from neck and shoulders to behind the eyes Quality:  Throbbing, pressure Initial intensity:  10/10 Aura:  no Prodrome:  no Associated symptoms:  Dizziness, nausea, photophobia, phonophobia Initial Duration:  constant Initial Frequency:  Daily (16-20 days severe) Triggers:  Emotional stress, getting upset, menstrual cycle, certain perfumes, sun light Relieving factors:  none Activity:  Difficult to function when severe   Her headaches improved during her pregnancy but returned post-partum.   Due to worsening headaches, an MRI of the brain with and without contrast was performed on 11/04/2018 and was normal.   Past NSAIDS:  Advil, Aleve Past analgesics:  BC powder, Tylenol, Midol Past abortive triptans:  Sumatriptan 100mg , sumatriptan NS, Maxalt, Relpax Past abortive ergotamine:  none Past muscle relaxants:  tizanidine Past anti-emetic:  Zofran Past antihypertensive medications:  would not use beta blockers due to underlying asthma. Past  antidepressant medications:  Nortriptyline 25mg  (side effects), Zoloft Past anticonvulsant medications:  topiramate 100mg  (ineffective) Past anti-CGRP:  Aimovig 70mg  (helped but it made her feel hot, lightheaded, nauseous with vomiting), Emgality, Nurtec (rescue), Qulipta 60mg  (caused nausea).  Would rather avoid injection Past vitamins/Herbal/Supplements:  none Past antihistamines/decongestants:  none Other past therapies:  Trigger point injections   Family history of headaches:  No  Past Medical History: Past Medical History:  Diagnosis Date   Anginal pain (HCC)    r/t anxiety   Anxiety    on meds   Asthma    no inhaler - seasonal   Constipation    Depression    on meds   Dysrhythmia    Palpatations - r/t anxiety   Genital warts    GERD (gastroesophageal reflux disease)    on meds   H/O hiatal hernia    surgery to repair   Headache(784.0)    Heart murmur    History of low transverse cesarean section 02/12/2018   MRSA cellulitis    greater than 10 yrs ago dx at urgent care    Seasonal allergies    Sickle cell trait (HCC)     Medications: Outpatient Encounter Medications as of 05/30/2022  Medication Sig   albuterol (VENTOLIN HFA) 108 (90 Base) MCG/ACT inhaler Inhale 2 puffs into the lungs every 6 (six) hours as needed. (Patient not taking: Reported on 01/06/2022)   amoxicillin (AMOXIL) 250 MG capsule Take 3 capsules (750 mg total) by mouth 3 (three) times daily.   cetirizine (ZYRTEC) 10 MG tablet Take 10 mg by mouth daily. (Patient not taking: Reported on 01/06/2022)   cyclobenzaprine (FLEXERIL) 10 MG tablet May use every 8 hours for headache (Patient not taking: Reported on 01/06/2022)   dicyclomine (BENTYL) 10 MG capsule Take 1 capsule (10 mg total) by mouth every 6 (six) hours as needed for spasms.   esomeprazole (NEXIUM) 40 MG capsule Take 1 capsule (40 mg total) by mouth 2 (two) times daily before a meal.   famotidine (PEPCID) 20 MG tablet Take 20 mg by mouth 2 (two)  times daily.   fluticasone (FLONASE) 50 MCG/ACT nasal spray Place 2 sprays into both nostrils daily. (Patient not taking: Reported on 01/06/2022)   levofloxacin (LEVAQUIN) 500 MG tablet Take 1 tablet (500 mg total) by mouth daily.   linaclotide (LINZESS) 145 MCG CAPS capsule Take 1 capsule (145 mcg total) by mouth daily before breakfast. (Patient not taking: Reported on 12/08/2021)   ondansetron (ZOFRAN ODT) 4 MG disintegrating tablet Take 1 tablet (4 mg total) by mouth every 8 (eight) hours as needed for nausea or vomiting.   SUMAtriptan ([name]) 20 MG/ACT nasal spray 1 spray in nostril.  May repeat x1 in 2 hours if headache persists or recurs.   venlafaxine XR (EFFEXOR-XR) 37.5 MG 24 hr capsule Take 1 capsule (37.5 mg total) by mouth daily with breakfast.   [DISCONTINUED] ondansetron (ZOFRAN ODT) 4 MG disintegrating tablet Take 1 tablet (4 mg total) by mouth every 8 (eight) hours as needed for nausea or vomiting. (Patient not taking: Reported on 01/06/2022)   [DISCONTINUED] SUMAtriptan ([name]) 20 MG/ACT nasal spray 1 spray in nostril.  May repeat x1 in 2 hours if headache persists or recurs.   [DISCONTINUED] venlafaxine XR (EFFEXOR-XR) 37.5 MG 24 hr capsule TAKE 1 CAPSULE BY MOUTH DAILY WITH BREAKFAST. (Patient not taking: Reported on 01/06/2022)   No facility-administered encounter medications on file as of  05/30/2022.    Allergies: Allergies  Allergen Reactions   Aimovig [Erenumab-Aooe]     Hot/lightheaded/nausea and vomiting   Doxycycline Hyclate Hives, Itching and Swelling   Ketorolac Tromethamine Other (See Comments)    Patient stated that it makes her extremely hyper    Family History: Family History  Problem Relation Age of Onset   Hypertension Mother    Depression Mother    Depression Father    Breast cancer Maternal Aunt        Great Aunt   Cancer Maternal Aunt    Heart disease Maternal Grandmother    Diabetes Maternal Grandfather    Heart disease Maternal Grandfather     Hypertension Maternal Grandfather    Congestive Heart Failure Paternal Grandfather    Anesthesia problems Neg Hx    Colon polyps Neg Hx    Colon cancer Neg Hx    Esophageal cancer Neg Hx    Stomach cancer Neg Hx    Rectal cancer Neg Hx     Observations/Objective:   No acute distress.  Alert and oriented.  Speech fluent and not dysarthric.  Language intact.     Follow Up Instructions:    -I discussed the assessment and treatment plan with the patient. The patient was provided an opportunity to ask questions and all were answered. The patient agreed with the plan and demonstrated an understanding of the instructions.   The patient was advised to call back or seek an in-person evaluation if the symptoms worsen or if the condition fails to improve as anticipated.   Cira Servant, DO CC: Norva Riffle, PA

## 2022-05-30 ENCOUNTER — Telehealth: Payer: Medicaid Other | Admitting: Neurology

## 2022-05-30 ENCOUNTER — Encounter: Payer: Self-pay | Admitting: Neurology

## 2022-05-30 DIAGNOSIS — G43709 Chronic migraine without aura, not intractable, without status migrainosus: Secondary | ICD-10-CM | POA: Diagnosis not present

## 2022-05-30 DIAGNOSIS — M79602 Pain in left arm: Secondary | ICD-10-CM

## 2022-05-30 MED ORDER — SUMATRIPTAN 20 MG/ACT NA SOLN: NASAL | 5 refills | Status: DC

## 2022-05-30 MED ORDER — ONDANSETRON 4 MG PO TBDP: 4.0000 mg | ORAL_TABLET | Freq: Three times a day (TID) | ORAL | 0 refills | Status: DC | PRN

## 2022-05-30 MED ORDER — VENLAFAXINE HCL ER 37.5 MG PO CP24: 37.5000 mg | ORAL_CAPSULE | Freq: Every day | ORAL | 2 refills | Status: DC

## 2022-06-16 ENCOUNTER — Encounter: Payer: Self-pay | Admitting: Physician Assistant

## 2022-06-16 DIAGNOSIS — Z0000 Encounter for general adult medical examination without abnormal findings: Secondary | ICD-10-CM

## 2022-07-04 ENCOUNTER — Encounter: Payer: Medicaid Other | Admitting: Neurology

## 2022-07-04 ENCOUNTER — Encounter: Payer: Self-pay | Admitting: Neurology

## 2022-08-01 ENCOUNTER — Ambulatory Visit: Payer: Medicaid Other | Admitting: Neurology

## 2022-08-01 DIAGNOSIS — M5412 Radiculopathy, cervical region: Secondary | ICD-10-CM | POA: Diagnosis not present

## 2022-08-01 DIAGNOSIS — M79602 Pain in left arm: Secondary | ICD-10-CM

## 2022-08-01 NOTE — Procedures (Signed)
  [facility]  [address] [place], [address]  [place], [place] [address] Tel: [phone] Fax: [phone] Test Date:  08/01/2022  Patient: Amanda [name] DOB: 06-Jun-1980 Physician: Jacquelyne Balint, MD  Sex: Female Height: 5\' 3"  Ref Phys: Shon Millet, DO  ID#: [id number]   Technician:    History: This is a 42 year old female with left arm pain and numbness.  NCV & EMG Findings: Extensive electrodiagnostic evaluation of the left upper limb shows: Left median, ulnar, and radial sensory responses are within normal limits. Left median (APB) and ulnar (ADM) motor responses are within normal limits. Chronic motor axon loss changes without accompanying active denervation changes are seen in the left first dorsal interosseous, extensor indicis proprius, and cervical (C7 level) paraspinal muscles.  Impression: This is an abnormal study. The findings are most consistent with the following: The residuals of an old intraspinal canal lesion (ie: motor radiculopathy) at the left C8 root or segment, mild in degree electrically. No electrodiagnostic evidence of a left ulnar neuropathy. No electrodiagnostic evidence of a right median mononeuropathy at or distal to the wrist, consistent with carpal tunnel syndrome.   ___________________________ Jacquelyne Balint, MD    Nerve Conduction Studies Motor Nerve Results    Latency Amplitude F-Lat Segment Distance CV Comment  Site (ms) Norm (mV) Norm (ms)  (cm) (m/s) Norm   Left Median (APB) Motor  Wrist 2.1  < 3.9 13.2  > 6.0        Elbow 6.7 - 11.7 -  Elbow-Wrist 29 63  > 50   Left Ulnar (ADM) Motor  Wrist 1.68  < 3.1 9.4  > 7.0        Bel elbow 5.0 - 8.5 -  Bel elbow-Wrist 21 64  > 50   Ab elbow 6.7 - 7.5 -  Ab elbow-Bel elbow 10 59 -    Sensory Sites    Neg Peak Lat Amplitude (O-P) Segment Distance Velocity Comment  Site (ms) Norm (V) Norm  (cm) (ms)   Left Median Sensory  Wrist-Dig II 2.9  < 3.4 45  > 20 Wrist-Dig II 13    Left  Radial Sensory  Forearm-Wrist 1.95  < 2.7 33  > 18 Forearm-Wrist 10    Left Ulnar Sensory  Wrist-Dig V 2.4  < 3.1 31  > 12 Wrist-Dig V 11     Electromyography   Side Muscle Ins.Act Fibs Fasc Recrt Amp Dur Poly Activation Comment  Left FDI Nml Nml Nml *1- *1+ *1+ *1+ Nml N/A  Left EIP Nml Nml Nml *1- *1+ *1+ *1+ Nml N/A  Left Pronator teres Nml Nml Nml Nml Nml Nml Nml Nml N/A  Left Biceps Nml Nml Nml Nml Nml Nml Nml Nml N/A  Left Triceps Nml Nml Nml Nml Nml Nml Nml Nml N/A  Left Deltoid Nml Nml Nml Nml Nml Nml Nml Nml N/A  Left C7 PSP *CRD Nml Nml *1- *1+ *1+ *1+ Nml N/A      Waveforms:  Motor      Sensory

## 2022-08-04 ENCOUNTER — Telehealth: Payer: Self-pay

## 2022-08-04 DIAGNOSIS — M5412 Radiculopathy, cervical region: Secondary | ICD-10-CM

## 2022-08-04 NOTE — Telephone Encounter (Signed)
Patient advised of note below. Order added.

## 2022-08-04 NOTE — Telephone Encounter (Signed)
-----   Message from Cira Servant sent at 08/02/2022  8:24 AM EDT ----- Nerve study does show evidence of nerve irritation in the neck that is the likely cause of the left arm pain and numbness.  In this situation, we first would try physical therapy for left sided cervical radiculopathy.  If agreeable, please send in order.

## 2022-08-09 ENCOUNTER — Ambulatory Visit: Payer: Medicaid Other | Attending: Neurology | Admitting: Physical Therapy

## 2022-08-09 ENCOUNTER — Encounter: Payer: Self-pay | Admitting: Physical Therapy

## 2022-08-09 ENCOUNTER — Other Ambulatory Visit: Payer: Self-pay

## 2022-08-09 DIAGNOSIS — M542 Cervicalgia: Secondary | ICD-10-CM | POA: Insufficient documentation

## 2022-08-09 DIAGNOSIS — M79602 Pain in left arm: Secondary | ICD-10-CM | POA: Diagnosis present

## 2022-08-09 DIAGNOSIS — M5412 Radiculopathy, cervical region: Secondary | ICD-10-CM | POA: Insufficient documentation

## 2022-08-09 NOTE — Therapy (Signed)
OUTPATIENT PHYSICAL THERAPY CERVICAL EVALUATION   Patient Name: Amanda [name] MRN: [id number] DOB:[birth year], 42 y.o., female Today's Date: 08/09/2022  END OF SESSION:  08/09/22 1419  PT Visits / Re-Eval  Visit Number 1  Number of Visits 7 (6 + eval)  Date for PT Re-Evaluation 09/30/22 (pushed out due to scheduling delay)  Authorization  Authorization Type [place] MEDICAID HEALTHY BLUE  PT Time Calculation  PT Start Time 1412 (PT with patient prior and patient late to eval.)  PT Stop Time 1500  PT Time Calculation (min) 48 min  PT - End of Session  Behavior During Therapy WFL for tasks assessed/performed    Past Medical History:  Diagnosis Date   Anginal pain (HCC)    r/t anxiety   Anxiety    on meds   Asthma    no inhaler - seasonal   Constipation    Depression    on meds   Dysrhythmia    Palpatations - r/t anxiety   Genital warts    GERD (gastroesophageal reflux disease)    on meds   H/O hiatal hernia    surgery to repair   Headache(784.0)    Heart murmur    History of low transverse cesarean section 02/12/2018   MRSA cellulitis    greater than 10 yrs ago dx at urgent care    Seasonal allergies    Sickle cell trait (HCC)    Past Surgical History:  Procedure Laterality Date   CERVICAL CONE BIOPSY     CESAREAN SECTION  01/20/2012   Procedure: CESAREAN SECTION;  Surgeon: [name], MD;  Location: [facility];  Service: Obstetrics;  Laterality: N/A;   CESAREAN SECTION WITH BILATERAL TUBAL LIGATION N/A 02/13/2018   Procedure: CESAREAN SECTION WITH BILATERAL TUBAL LIGATION;  Surgeon: Sherian Rein, MD;  Location: [facility];  Service: Obstetrics;  Laterality: N/A;  Herbert Seta, RNFA  Needs [name]   COLONOSCOPY     DILATION AND EVACUATION N/A 03/30/2017   Procedure: DILATATION AND EVACUATION WITH ULTRASOUND GUIDANCE;  Surgeon: Sherian Rein, MD;  Location: [facility];  Service: Gynecology;  Laterality: N/A;   HERNIA REPAIR     INSERTION OF  MESH N/A 05/15/2015   Procedure: INSERTION OF MESH;  Surgeon: Axel Filler, MD;  Location: [facility];  Service: General;  Laterality: N/A;   MARSUPIALIZATION URETHRAL DIVERTICULUM     UMBILICAL HERNIA REPAIR N/A 05/15/2015   Procedure: LAPAROSCOPIC UMBILICAL HERNIA;  Surgeon: Axel Filler, MD;  Location: [facility];  Service: General;  Laterality: N/A;   UPPER GI ENDOSCOPY     WISDOM TOOTH EXTRACTION     Patient Active Problem List   Diagnosis Date Noted   Allergy history, drug 02/23/2022   Snoring 06/08/2021   Screening for cardiovascular condition 12/07/2018   Mixed hyperlipidemia 12/07/2018   Benign gestational thrombocytopenia (HCC) 11/06/2018   Status post repeat low transverse cesarean section 02/13/2018   History of low transverse cesarean section 02/12/2018   Decreased fetal movement 02/07/2018   Migraine 09/03/2017   Maternal age 9+, multigravida, antepartum 07/24/2017   Maternal obesity affecting pregnancy, antepartum 07/24/2017   Constipation 07/24/2017   Sickle cell trait (HCC) 07/24/2017   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 pylori infection 11/02/2015   Intractable chronic  migraine without aura and without status migrainosus 03/19/2015   Morbid obesity (HCC) 03/19/2015   Murmur 06/20/2012   INSOMNIA 08/14/2009   DYSMENORRHEA 12/09/2008   FATIGUE 03/25/2008   DEPRESSION/ANXIETY 09/11/2007   HEADACHE 09/11/2007   PALPITATIONS 02/23/2007   Atypical chest pain 02/23/2007   ALLERGIC RHINITIS 10/06/2006   ASTHMA 10/06/2006   GERD 10/06/2006   Asthma 10/06/2006    PCP: Norm Salt, PA  REFERRING PROVIDER: Drema Dallas, DO  REFERRING DIAG: [phone] (ICD-10-CM) - Cervical radiculopathy  THERAPY DIAG:   Radiculopathy, cervical region  Pain in left arm  Cervicalgia  Rationale for Evaluation and Treatment: Rehabilitation  ONSET DATE: 1 year ago  SUBJECTIVE:                                                                                                                                                                                                         SUBJECTIVE STATEMENT: Patient reports symptoms for about a year.  She states she has had some neck pain that turned into left arm pain mostly in the upper and posterior shoulder (she rubs the upper trap area) and the dorsal aspect of the 4th and 5th digits.  She states she has migraines that happen at the same time as neck pain at the base of the neck radiating upward.  She states she currently has an ongoing migraine of 5 days.  She has been relying on muscle relaxers here lately.  She reports bilateral cramping in both hands causing them to lock up randomly usually when doing activity with her hands in one position too long. Hand dominance: Right  PERTINENT HISTORY:  GERD, depression, anxiety, migraines, sickle cell trait, asthma  PAIN:  Are you having pain? Yes: NPRS scale: 4/10 Pain location: migraine Pain description: migraine Aggravating factors: bending down and standing back up, lights, loud noises Relieving factors: medications, ice pack, dark room  PRECAUTIONS: None  RED FLAGS: None     WEIGHT BEARING RESTRICTIONS: No  FALLS:  Has patient fallen in last 6 months? 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 Everlena Cooper  12/06/2022  OBJECTIVE:   DIAGNOSTIC FINDINGS:  No recent  relevant imaging.  PATIENT SURVEYS:  NDI 30/50 = 60% = moderate to high perceived disability  COGNITION: Overall cognitive status: Within functional limits for tasks assessed  SENSATION: Light touch: WFL and N/T down the entire left arm and intermittently in the right hand  POSTURE: rounded shoulders  PALPATION: Tightness in left upper trap, no cervical midline tenderness or excessive tightness noted today, difficult to assess suboccipitals due to dowager's hump and upper crossed syndrome impacting cervical posture   CERVICAL ROM:   Active ROM A/PROM (deg) eval  Flexion 29 degrees  Extension 14 degrees  Right lateral flexion 25 degrees  Left lateral flexion 31 degrees  Right rotation 26 degrees  Left rotation 30 degrees   (Blank rows = not tested)  UPPER EXTREMITY ROM:  Active ROM Right eval Left eval  Shoulder flexion All WNL, no pain during motion  Shoulder extension   Shoulder abduction   Shoulder adduction   Shoulder extension   Shoulder internal rotation   Shoulder external rotation   Elbow flexion   Elbow extension   Wrist flexion   Wrist extension   Wrist ulnar deviation    Wrist radial deviation    Wrist pronation    Wrist supination     (Blank rows = not tested)  UPPER EXTREMITY MMT:  MMT Right eval Left eval  Shoulder flexion 4-/5 4-/5  Shoulder extension    Shoulder abduction 4-/5; soreness in upper trap and base of neck 3+/5; soreness in upper trap and base of neck  Shoulder adduction    Shoulder extension    Shoulder internal rotation    Shoulder external rotation    Middle trapezius    Lower trapezius    Elbow flexion 5/5 5/5  Elbow extension    Wrist flexion 5/5 5/5  Wrist extension 5/5 4/5  Wrist ulnar deviation    Wrist radial deviation    Wrist pronation    Wrist supination    Grip strength     (Blank rows = not tested)  CERVICAL SPECIAL TESTS:  Upper limb  tension test (ULTT): testing limited due to posterior arm soreness and pt guarding, she becomes tearful with initial median 1 positioning and Distraction test: +; pt reports relief of pressure and some tingling  FUNCTIONAL TESTS:  None completed due to lack of relevance to chief complaint  TODAY'S TREATMENT:                                                                                                                              DATE: N/A eval only.   PATIENT EDUCATION:  Education details: Discussed chiropractic appts and benefits and risks-PT educated on exercising precaution due to chronic neurologic symptoms and full-disclosure of symptom progression and PMH to providers for best informed practice as well as symptom logging before and after appts to correlate relief vs exacerbation.  Discussed messaging Dr. Everlena Cooper via MyChart for further neurological input and advice.  Educated on temporary  modalities such as massage per inquiry.  PT POC, assessments used, and goals to be set. Person educated: Patient Education method: Explanation Education comprehension: verbalized understanding  HOME EXERCISE PROGRAM: To be established.  ASSESSMENT:  CLINICAL IMPRESSION: Patient is a 42 y.o. female who was seen today for physical therapy evaluation and treatment for cervicogenic headaches and possible left UE radiculopathy.  Pt has a significant PMH of GERD, depression, anxiety, migraines, sickle cell trait, and asthma.  Identified impairments include migraines, reported N/T in the LUE and intermittently in the right hand, upper crossed syndrome with rounded shoulders, left upper trap tightness, and decreased cervical AROM and shoulder strength.  Unable to adequately assess ULTT due to patient tearfulness during initiation of positioning.  She did have a positive distraction test suggesting neurologic involvement.  She would benefit from skilled PT to address impairments as noted and progress towards long  term goals.  OBJECTIVE IMPAIRMENTS: decreased ROM, decreased strength, impaired sensation, improper body mechanics, postural dysfunction, and pain.   ACTIVITY LIMITATIONS: carrying, lifting, and reach over head  PARTICIPATION LIMITATIONS: meal prep, cleaning, laundry, shopping, community activity, and occupation  PERSONAL FACTORS: Fitness, Sex, Time since onset of injury/illness/exacerbation, and 1-2 comorbidities: anxiety, chronic migraines  are also affecting patient's functional outcome.   REHAB POTENTIAL: Good  CLINICAL DECISION MAKING: Stable/uncomplicated  EVALUATION COMPLEXITY: Low   GOALS: Goals reviewed with patient? Yes  SHORT TERM GOALS: Target date: 09/02/2022  Patient will tolerate positional testing for all ULTT in order to assess and treat for possible neurologic involvement and promote pain management. Baseline: To be further assessed. Goal status: INITIAL  LONG TERM GOALS: Target date: 09/23/2022  Pt will be independent and compliant with cervical and UE stretching, mobilizing, and strengthening HEP in order to maintain functional progress and promote pain and migraine management. Baseline: To be established. Goal status: INITIAL  2.  Patient will present with at least 2 less migraine days per week than baseline in order to demonstrate improved pain management. Baseline: 5 day long migraine at eval. Goal status: INITIAL  3.  Grip strength goal to be assessed and established as appropriate. Baseline: To be assessed. Goal status: INITIAL  PLAN:  PT FREQUENCY: 1x/week  PT DURATION: 6 weeks  PLANNED INTERVENTIONS: Therapeutic exercises, Therapeutic activity, Neuromuscular re-education, Balance training, Gait training, Patient/Family education, Self Care, Joint mobilization, Spinal mobilization, Taping, Traction, Manual therapy, and Re-evaluation  PLAN FOR NEXT SESSION: Attempt reassessment of ULTT if pt can tolerate.  Grip strength assessment?  Initiate HEP  for cervical strengthening, upper trap stretching, and self-mobilization.  Distraction mobilization, myofascial work, IASTM to upper traps, chirp wheel, no DN due to fear of needles  Check all possible CPT codes: 04540 - PT Re-evaluation, 97110- Therapeutic Exercise, [phone]- Neuro Re-education, [phone] - Gait Training, [phone] - Manual Therapy, 97530 - Therapeutic Activities, [phone] - Self Care, and [phone] - Mechanical traction    Check all conditions that are expected to impact treatment: Psychological or psychiatric disorders  If treatment provided at initial evaluation, no treatment charged due to lack of authorization.   Sadie Haber, PT, DPT 08/09/2022, 3:01 PM

## 2022-08-14 ENCOUNTER — Other Ambulatory Visit (HOSPITAL_COMMUNITY): Payer: Self-pay

## 2022-08-21 NOTE — Therapy (Incomplete)
OUTPATIENT PHYSICAL THERAPY CERVICAL EVALUATION   Patient Name: Amanda [name] MRN: [id number] DOB:[birth year], 42 y.o., female Today's Date: 08/09/2022  END OF SESSION:  08/09/22 1419  PT Visits / Re-Eval  Visit Number 1  Number of Visits 7 (6 + eval)  Date for PT Re-Evaluation 09/30/22 (pushed out due to scheduling delay)  Authorization  Authorization Type [place] MEDICAID HEALTHY BLUE  PT Time Calculation  PT Start Time 1412 (PT with patient prior and patient late to eval.)  PT Stop Time 1500  PT Time Calculation (min) 48 min  PT - End of Session  Behavior During Therapy WFL for tasks assessed/performed    Past Medical History:  Diagnosis Date  . Anginal pain (HCC)    r/t anxiety  . Anxiety    on meds  . Asthma    no inhaler - seasonal  . Constipation   . Depression    on meds  . Dysrhythmia    Palpatations - r/t anxiety  . Genital warts   . GERD (gastroesophageal reflux disease)    on meds  . H/O hiatal hernia    surgery to repair  . Headache(784.0)   . Heart murmur   . History of low transverse cesarean section 02/12/2018  . MRSA cellulitis    greater than 10 yrs ago dx at urgent care   . Seasonal allergies   . Sickle cell trait [facility])    Past Surgical History:  Procedure Laterality Date  . CERVICAL CONE BIOPSY    . CESAREAN SECTION  01/20/2012   Procedure: CESAREAN SECTION;  Surgeon: [name], MD;  Location: [facility];  Service: Obstetrics;  Laterality: N/A;  . CESAREAN SECTION WITH BILATERAL TUBAL LIGATION N/A 02/13/2018   Procedure: CESAREAN SECTION WITH BILATERAL TUBAL LIGATION;  Surgeon: Sherian Rein, MD;  [facility];  Service: Obstetrics;  Laterality: N/A;  [name], RNFA  Needs [name]  . COLONOSCOPY    . DILATION AND EVACUATION N/A 03/30/2017   Procedure: DILATATION AND EVACUATION WITH ULTRASOUND GUIDANCE;  Surgeon: Sherian Rein, MD;  Location: [facility];  Service: Gynecology;  Laterality: N/A;  . HERNIA  REPAIR    . INSERTION OF MESH N/A 05/15/2015   Procedure: INSERTION OF MESH;  Surgeon: Axel Filler, MD;  Location: [facility];  Service: General;  Laterality: N/A;  . MARSUPIALIZATION URETHRAL DIVERTICULUM    . UMBILICAL HERNIA REPAIR N/A 05/15/2015   Procedure: LAPAROSCOPIC UMBILICAL HERNIA;  Surgeon: Axel Filler, MD;  Location: [facility];  Service: General;  Laterality: N/A;  . UPPER GI ENDOSCOPY    . WISDOM TOOTH EXTRACTION     Patient Active Problem List   Diagnosis Date Noted  . Allergy history, drug 02/23/2022  . Snoring 06/08/2021  . Screening for cardiovascular condition 12/07/2018  . Mixed hyperlipidemia 12/07/2018  . Benign gestational thrombocytopenia (HCC) 11/06/2018  . Status post repeat low transverse cesarean section 02/13/2018  . History of low transverse cesarean section 02/12/2018  . Decreased fetal movement 02/07/2018  . Migraine 09/03/2017  . Maternal age 65+, multigravida, antepartum 07/24/2017  . Maternal obesity affecting pregnancy, antepartum 07/24/2017  . Constipation 07/24/2017  . Sickle cell trait (HCC) 07/24/2017  . 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 Internal derangement of right knee 03/13/2017  . 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 Morning sickness 01/05/2017  . Abdominal pain affecting pregnancy 01/05/2017  . Suprapubic pain 01/05/2017  . Bacterial vaginitis 12/08/2016  . Low back pain 12/16/2015  . H. pylori infection 11/02/2015  . Intractable chronic  migraine without aura and without status migrainosus 03/19/2015  . Morbid obesity (HCC) 03/19/2015  . Murmur 06/20/2012  . INSOMNIA 08/14/2009  . DYSMENORRHEA 12/09/2008  . FATIGUE 03/25/2008  . DEPRESSION/ANXIETY 09/11/2007  . HEADACHE 09/11/2007  . PALPITATIONS 02/23/2007  . Atypical chest pain 02/23/2007  . ALLERGIC RHINITIS 10/06/2006  . ASTHMA 10/06/2006  . GERD 10/06/2006  . Asthma 10/06/2006    PCP: Norm Salt, PA  REFERRING PROVIDER: Drema Dallas, DO  REFERRING DIAG:  [id number] (ICD-10-CM) - Cervical radiculopathy  THERAPY DIAG:  Radiculopathy, cervical region  Pain in left arm  Cervicalgia  Rationale for Evaluation and Treatment: Rehabilitation  ONSET DATE: 1 year ago  SUBJECTIVE:                                                                                                                                                                                                         SUBJECTIVE STATEMENT: Patient reports symptoms for about a year.  She states she has had some neck pain that turned into left arm pain mostly in the upper and posterior shoulder (she rubs the upper trap area) and the dorsal aspect of the 4th and 5th digits.  She states she has migraines that happen at the same time as neck pain at the base of the neck radiating upward.  She states she currently has an ongoing migraine of 5 days.  She has been relying on muscle relaxers here lately.  She reports bilateral cramping in both hands causing them to lock up randomly usually when doing activity with her hands in one position too long. Hand dominance: Right  PERTINENT HISTORY:  GERD, depression, anxiety, migraines, sickle cell trait, asthma  PAIN:  Are you having pain? Yes: NPRS scale: 4/10 Pain location: migraine Pain description: migraine Aggravating factors: bending down and standing back up, lights, loud noises Relieving factors: medications, ice pack, dark room  PRECAUTIONS: None  RED FLAGS: None     WEIGHT BEARING RESTRICTIONS: No  FALLS:  Has patient fallen in last 6 months? 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 Everlena Cooper 12/06/2022  OBJECTIVE:   DIAGNOSTIC FINDINGS:  No recent  relevant imaging.  PATIENT SURVEYS:  NDI 30/50 = 60% = moderate to high perceived disability  COGNITION: Overall cognitive status: Within functional limits for tasks assessed  SENSATION: Light touch: WFL and N/T down the entire left arm and intermittently in the right hand  POSTURE: rounded shoulders  PALPATION: Tightness in left upper trap, no cervical midline tenderness or excessive tightness noted today, difficult to assess suboccipitals due to dowager's hump and upper crossed syndrome impacting cervical posture   CERVICAL ROM:   Active ROM A/PROM (deg) eval  Flexion 29 degrees  Extension 14 degrees  Right lateral flexion 25 degrees  Left lateral flexion 31 degrees  Right rotation 26 degrees  Left rotation 30 degrees   (Blank rows = not tested)  UPPER EXTREMITY ROM:  Active ROM Right eval Left eval  Shoulder flexion All WNL, no pain during motion  Shoulder extension   Shoulder abduction   Shoulder adduction   Shoulder extension   Shoulder internal rotation   Shoulder external rotation   Elbow flexion   Elbow extension   Wrist flexion   Wrist extension   Wrist ulnar deviation    Wrist radial deviation    Wrist pronation    Wrist supination     (Blank rows = not tested)  UPPER EXTREMITY MMT:  MMT Right eval Left eval  Shoulder flexion 4-/5 4-/5  Shoulder extension    Shoulder abduction 4-/5; soreness in upper trap and base of neck 3+/5; soreness in upper trap and base of neck  Shoulder adduction    Shoulder extension    Shoulder internal rotation    Shoulder external rotation    Middle trapezius    Lower trapezius    Elbow flexion 5/5 5/5  Elbow extension    Wrist flexion 5/5 5/5  Wrist extension 5/5 4/5  Wrist ulnar deviation    Wrist radial deviation    Wrist pronation    Wrist supination    Grip strength     (Blank rows =  not tested)  CERVICAL SPECIAL TESTS:  Upper limb tension test (ULTT): testing limited due to posterior arm soreness and pt guarding, she becomes tearful with initial median 1 positioning and Distraction test: +; pt reports relief of pressure and some tingling  FUNCTIONAL TESTS:  None completed due to lack of relevance to chief complaint  TODAY'S TREATMENT:                                                                                                                              DATE: N/A eval only.   PATIENT EDUCATION:  Education details: Discussed chiropractic appts and benefits and risks-PT educated on exercising precaution due to chronic neurologic symptoms and full-disclosure of symptom progression and PMH to providers for best informed practice as well as symptom logging before and after appts to correlate relief vs exacerbation.  Discussed messaging Dr. Everlena Cooper via MyChart for further neurological input and advice.  Educated on temporary  modalities such as massage per inquiry.  PT POC, assessments used, and goals to be set. Person educated: Patient Education method: Explanation Education comprehension: verbalized understanding  HOME EXERCISE PROGRAM: To be established.  ASSESSMENT:  CLINICAL IMPRESSION: Patient is a 42 y.o. female who was seen today for physical therapy evaluation and treatment for cervicogenic headaches and possible left UE radiculopathy.  Pt has a significant PMH of ***.  Identified impairments include ***.  Evaluation via the following assessment tools: *** indicate fall risk.  They would benefit from skilled PT to address impairments as noted and progress towards long term goals.  OBJECTIVE IMPAIRMENTS: {opptimpairments:25111}.   ACTIVITY LIMITATIONS: {activitylimitations:27494}  PARTICIPATION LIMITATIONS: {participationrestrictions:25113}  PERSONAL FACTORS: {Personal factors:25162} are also affecting patient's functional outcome.   REHAB POTENTIAL:  {rehabpotential:25112}  CLINICAL DECISION MAKING: {clinical decision making:25114}  EVALUATION COMPLEXITY: {Evaluation complexity:25115}   GOALS: Goals reviewed with patient? Yes  SHORT TERM GOALS: Target date: ***  *** Baseline:  Goal status: INITIAL  2.  *** Baseline:  Goal status: INITIAL  3.  *** Baseline:  Goal status: INITIAL  4.  *** Baseline:  Goal status: INITIAL  5.  *** Baseline:  Goal status: INITIAL  6.  *** Baseline:  Goal status: INITIAL  LONG TERM GOALS: Target date: ***  *** Baseline:  Goal status: INITIAL  2.  *** Baseline:  Goal status: INITIAL  3.  *** Baseline:  Goal status: INITIAL  4.  *** Baseline:  Goal status: INITIAL  5.  *** Baseline:  Goal status: INITIAL  6.  *** Baseline:  Goal status: INITIAL   PLAN:  PT FREQUENCY: 1x/week  PT DURATION: 6 weeks  PLANNED INTERVENTIONS: {rehab planned interventions:25118::"Therapeutic exercises","Therapeutic activity","Neuromuscular re-education","Balance training","Gait training","Patient/Family education","Self Care","Joint mobilization"}  PLAN FOR NEXT SESSION: ***Attempt reassessment of ULTT if pt can tolerate.  Initiate HEP for cervical strengthening, upper trap stretching, and self-mobilization.  Distraction mobilization, myofascial work, IASTM to upper traps, chirp wheel, no DN due to fear of needles  Sadie Haber, PT, DPT 08/09/2022, 3:01 PM

## 2022-08-22 ENCOUNTER — Ambulatory Visit: Payer: Medicaid Other | Admitting: Physical Therapy

## 2022-08-29 ENCOUNTER — Ambulatory Visit: Payer: Medicaid Other | Attending: Neurology | Admitting: Physical Therapy

## 2022-08-29 ENCOUNTER — Encounter: Payer: Self-pay | Admitting: Physical Therapy

## 2022-08-29 DIAGNOSIS — M79602 Pain in left arm: Secondary | ICD-10-CM | POA: Insufficient documentation

## 2022-08-29 DIAGNOSIS — M5412 Radiculopathy, cervical region: Secondary | ICD-10-CM | POA: Insufficient documentation

## 2022-08-29 DIAGNOSIS — M542 Cervicalgia: Secondary | ICD-10-CM | POA: Insufficient documentation

## 2022-08-29 NOTE — Patient Instructions (Signed)
Access Code: [email or web address] URL: [email or web address] Date: 08/29/2022 Prepared by: Camille Bal  Exercises - Supine Chin Tuck  - 1 x daily - 6 x weekly - 2 sets - 10 reps - 2 seconds hold - Supine Cervical Retraction with Towel  - 1 x daily - 6 x weekly - 1-2 sets - 15 reps - 2 seconds hold - Supine Segmental Cervical Flexion  - 1 x daily - 6 x weekly - 2 sets - 10 reps - Scapular retraction with resistance  - 1 x daily - 7 x weekly - 2 sets - 12 reps - Seated Upper Trapezius Stretch  - 1 x daily - 7 x weekly - 1 sets - 3 reps - 45 seconds hold - Seated Isometric Cervical Rotation  - 1 x daily - 7 x weekly - 2 sets - 10 reps - 3 seconds hold

## 2022-08-29 NOTE — Therapy (Signed)
OUTPATIENT PHYSICAL THERAPY CERVICAL TREATMENT   Patient Name: Amanda [name] MRN: [id number] DOB:January 26, 1980, 42 y.o., female Today's Date: 08/29/2022  END OF SESSION:  PT End of Session - 08/29/22 1625     Visit Number 2    Number of Visits 7   6 + eval   Date for PT Re-Evaluation 09/30/22   pushed out due to scheduling delay   Authorization Type [place] MEDICAID HEALTHY BLUE    Authorization - Visit Number 1    Authorization - Number of Visits 5    PT Start Time 1622    PT Stop Time 1700    PT Time Calculation (min) 38 min    Activity Tolerance Patient tolerated treatment well    Behavior During Therapy WFL for tasks assessed/performed            Past Medical History:  Diagnosis Date   Anginal pain (HCC)    r/t anxiety   Anxiety    on meds   Asthma    no inhaler - seasonal   Constipation    Depression    on meds   Dysrhythmia    Palpatations - r/t anxiety   Genital warts    GERD (gastroesophageal reflux disease)    on meds   H/O hiatal hernia    surgery to repair   Headache(784.0)    Heart murmur    History of low transverse cesarean section 02/12/2018   MRSA cellulitis    greater than 10 yrs ago dx at urgent care    Seasonal allergies    Sickle cell trait (HCC)    Past Surgical History:  Procedure Laterality Date   CERVICAL CONE BIOPSY     CESAREAN SECTION  01/20/2012   Procedure: CESAREAN SECTION;  Surgeon: [name], MD;  [facility]: [facility];  Service: Obstetrics;  Laterality: N/A;   CESAREAN SECTION WITH BILATERAL TUBAL LIGATION N/A 02/13/2018   Procedure: CESAREAN SECTION WITH BILATERAL TUBAL LIGATION;  Surgeon: Sherian Rein, MD;  [facility];  Service: Obstetrics;  Laterality: N/A;  Herbert Seta, RNFA  Needs [name]   COLONOSCOPY     DILATION AND EVACUATION N/A 03/30/2017   Procedure: DILATATION AND EVACUATION WITH ULTRASOUND GUIDANCE;  Surgeon: Sherian Rein, MD;  Location: [facility];  Service: Gynecology;   Laterality: N/A;   HERNIA REPAIR     INSERTION OF MESH N/A 05/15/2015   Procedure: INSERTION OF MESH;  Surgeon: Axel Filler, MD;  Location: [facility];  Service: General;  Laterality: N/A;   MARSUPIALIZATION URETHRAL DIVERTICULUM     UMBILICAL HERNIA REPAIR N/A 05/15/2015   Procedure: LAPAROSCOPIC UMBILICAL HERNIA;  Surgeon: Axel Filler, MD;  Location: [facility];  Service: General;  Laterality: N/A;   UPPER GI ENDOSCOPY     WISDOM TOOTH EXTRACTION     Patient Active Problem List   Diagnosis Date Noted   Allergy history, drug 02/23/2022   Snoring 06/08/2021   Screening for cardiovascular condition 12/07/2018   Mixed hyperlipidemia 12/07/2018   Benign gestational thrombocytopenia (HCC) 11/06/2018   Status post repeat low transverse cesarean section 02/13/2018   History of low transverse cesarean section 02/12/2018   Decreased fetal movement 02/07/2018   Migraine 09/03/2017   Maternal age 43+, multigravida, antepartum 07/24/2017   Maternal obesity affecting pregnancy, antepartum 07/24/2017   Constipation 07/24/2017   Sickle cell trait (HCC) 07/24/2017   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 pylori infection 11/02/2015   Intractable chronic migraine without aura and without status migrainosus 03/19/2015   Morbid obesity (HCC) 03/19/2015   Murmur 06/20/2012   INSOMNIA 08/14/2009   DYSMENORRHEA 12/09/2008   FATIGUE 03/25/2008   DEPRESSION/ANXIETY 09/11/2007   HEADACHE 09/11/2007   PALPITATIONS 02/23/2007   Atypical chest pain 02/23/2007   ALLERGIC RHINITIS 10/06/2006   ASTHMA 10/06/2006   GERD 10/06/2006   Asthma 10/06/2006    PCP: Norm Salt, PA  REFERRING PROVIDER: Drema Dallas, DO  REFERRING DIAG: [id number] (ICD-10-CM)  - Cervical radiculopathy  THERAPY DIAG:  Radiculopathy, cervical region  Pain in left arm  Cervicalgia  Rationale for Evaluation and Treatment: Rehabilitation  ONSET DATE: 1 year ago  SUBJECTIVE:                                                                                                                                                                                                         SUBJECTIVE STATEMENT: Patient denies changes in status.  She is not having pain today, but states this happens when she is on her cycle which she is at current.  She states she had one episode of hand cramping where her boyfriend had to pry it open and hold it until it settled down. Hand dominance: Right  PERTINENT HISTORY:  GERD, depression, anxiety, migraines, sickle cell trait, asthma  PAIN:  Are you having pain? No  PRECAUTIONS: None  RED FLAGS: None     WEIGHT BEARING RESTRICTIONS: No  FALLS:  Has patient fallen in last 6 months? 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 Everlena Cooper 12/06/2022  OBJECTIVE:   DIAGNOSTIC FINDINGS:  No recent relevant imaging.  PATIENT SURVEYS:  NDI 30/50 = 60% = moderate to high perceived disability  COGNITION: Overall cognitive status: Within functional limits for tasks assessed  SENSATION: Light touch: WFL and N/T down the entire left arm and intermittently in the right hand  POSTURE: rounded shoulders  PALPATION: Tightness in left upper trap, no cervical midline tenderness or  excessive tightness noted today, difficult to assess  suboccipitals due to dowager's hump and upper crossed syndrome impacting cervical posture   CERVICAL ROM:   Active ROM A/PROM (deg) eval  Flexion 29 degrees  Extension 14 degrees  Right lateral flexion 25 degrees  Left lateral flexion 31 degrees  Right rotation 26 degrees  Left rotation 30 degrees   (Blank rows = not tested)  UPPER EXTREMITY ROM:  Active ROM Right eval Left eval  Shoulder flexion All WNL, no pain during motion  Shoulder extension   Shoulder abduction   Shoulder adduction   Shoulder extension   Shoulder internal rotation   Shoulder external rotation   Elbow flexion   Elbow extension   Wrist flexion   Wrist extension   Wrist ulnar deviation    Wrist radial deviation    Wrist pronation    Wrist supination     (Blank rows = not tested)  UPPER EXTREMITY MMT:  MMT Right eval Left eval  Shoulder flexion 4-/5 4-/5  Shoulder extension    Shoulder abduction 4-/5; soreness in upper trap and base of neck 3+/5; soreness in upper trap and base of neck  Shoulder adduction    Shoulder extension    Shoulder internal rotation    Shoulder external rotation    Middle trapezius    Lower trapezius    Elbow flexion 5/5 5/5  Elbow extension    Wrist flexion 5/5 5/5  Wrist extension 5/5 4/5  Wrist ulnar deviation    Wrist radial deviation    Wrist pronation    Wrist supination    Grip strength     (Blank rows = not tested)  CERVICAL SPECIAL TESTS:  Upper limb tension test (ULTT): testing limited due to posterior arm soreness and pt guarding, she becomes tearful with initial median 1 positioning and Distraction test: +; pt reports relief of pressure and some tingling  FUNCTIONAL TESTS:  None completed due to lack of relevance to chief complaint  TODAY'S TREATMENT:                                                                                                                              DATE: 08/29/2022 -Median nerve 1 and 2:  negative -Ulnar nerve:  negative -Radial nerve: negative  Right (dominant) Grip strength: -Trial 1:  61.9 lbs -Trial 2:  59.9 lbs -Trial 3:  55.1 lbs AVERAGE:  58.97 lbs  Left Grip strength: -Trial 1:  46.2 lbs -Trial 2:  56.2 lbs -Trial 3:  44.7 lbs AVERAGE:  49.03 lbs  Initiated HEP: Access Code: [email or web address] URL: [email or web address] Date: 08/29/2022 Prepared by: Camille Bal  Exercises - Supine Chin Tuck  - 1 x daily - 6 x weekly - 2 sets - 10 reps - 2 seconds hold - Supine Cervical Retraction with Towel  - 1 x daily - 6 x weekly - 1-2 sets - 15 reps - 2 seconds hold - Supine Segmental Cervical Flexion  -  1 x daily - 6 x weekly - 2 sets - 10 reps - Scapular retraction with resistance  - 1 x daily - 7 x weekly - 2 sets - 12 reps - Seated Upper Trapezius Stretch  - 1 x daily - 7 x weekly - 1 sets - 3 reps - 45 seconds hold - Seated Isometric Cervical Rotation  - 1 x daily - 7 x weekly - 2 sets - 10 reps - 3 seconds hold  PATIENT EDUCATION:  Education details: Discussed grip strength WNL and ULTT negative.  Education on performance of initial HEP. Person educated: Patient Education method: Explanation Education comprehension: verbalized understanding  HOME EXERCISE PROGRAM: To be established.  ASSESSMENT:  CLINICAL IMPRESSION: Patient having no pain this visit with grip strength and ULTT insignificant showing no abnormalities.  Time spent initiating HEP for cervical strength and ROM.  Plan to address pain management and introduce self-mobilization techniques at next session.  Will continue per POC.  OBJECTIVE IMPAIRMENTS: decreased ROM, decreased strength, impaired sensation, improper body mechanics, postural dysfunction, and pain.   ACTIVITY LIMITATIONS: carrying, lifting, and reach over head  PARTICIPATION LIMITATIONS: meal prep, cleaning, laundry, shopping, community activity, and occupation  PERSONAL FACTORS: Fitness, Sex, Time since onset of injury/illness/exacerbation,  and 1-2 comorbidities: anxiety, chronic migraines  are also affecting patient's functional outcome.   REHAB POTENTIAL: Good  CLINICAL DECISION MAKING: Stable/uncomplicated  EVALUATION COMPLEXITY: Low   GOALS: Goals reviewed with patient? Yes  SHORT TERM GOALS: Target date: 09/02/2022  Patient will tolerate positional testing for all ULTT in order to assess and treat for possible neurologic involvement and promote pain management. Baseline: Assessed, negative testing. Goal status: MET  LONG TERM GOALS: Target date: 09/23/2022  Pt will be independent and compliant with cervical and UE stretching, mobilizing, and strengthening HEP in order to maintain functional progress and promote pain and migraine management. Baseline: To be established. Goal status: INITIAL  2.  Patient will present with at least 2 less migraine days per week than baseline in order to demonstrate improved pain management. Baseline: 5 day long migraine at eval. Goal status: INITIAL  3.  Grip strength goal to be assessed and established as appropriate. Baseline: To be assessed. Goal status: INITIAL  PLAN:  PT FREQUENCY: 1x/week  PT DURATION: 6 weeks  PLANNED INTERVENTIONS: Therapeutic exercises, Therapeutic activity, Neuromuscular re-education, Balance training, Gait training, Patient/Family education, Self Care, Joint mobilization, Spinal mobilization, Taping, Traction, Manual therapy, and Re-evaluation  PLAN FOR NEXT SESSION:  Add to HEP for self-mobilization.  Distraction mobilization, myofascial work, IASTM to upper traps, chirp wheel, no DN due to fear of needles, cervical SNAGs  Check all possible CPT codes: 16109 - PT Re-evaluation, 97110- Therapeutic Exercise, [phone]- Neuro Re-education, [phone] - Gait Training, [phone] - Manual Therapy, 97530 - Therapeutic Activities, [phone] - Self Care, and [phone] - Mechanical traction    Check all conditions that are expected to impact treatment: Psychological or  psychiatric disorders  If treatment provided at initial evaluation, no treatment charged due to lack of authorization.   Sadie Haber, PT, DPT 08/29/2022, 4:59 PM

## 2022-09-05 ENCOUNTER — Ambulatory Visit: Payer: Medicaid Other | Admitting: Physical Therapy

## 2022-09-12 ENCOUNTER — Ambulatory Visit: Payer: Medicaid Other | Admitting: Physical Therapy

## 2022-09-12 ENCOUNTER — Encounter: Payer: Self-pay | Admitting: Physical Therapy

## 2022-09-12 DIAGNOSIS — M79602 Pain in left arm: Secondary | ICD-10-CM

## 2022-09-12 DIAGNOSIS — M5412 Radiculopathy, cervical region: Secondary | ICD-10-CM

## 2022-09-12 DIAGNOSIS — M542 Cervicalgia: Secondary | ICD-10-CM

## 2022-09-12 NOTE — Therapy (Signed)
OUTPATIENT PHYSICAL THERAPY CERVICAL TREATMENT   Patient Name: Amanda [name] MRN: [id number] DOB:03-07-1980, 42 y.o., female Today's Date: 09/12/2022  END OF SESSION:  PT End of Session - 09/12/22 1630     Visit Number 3    Number of Visits 7   6 + eval   Date for PT Re-Evaluation 09/30/22   pushed out due to scheduling delay   Authorization Type [place] MEDICAID HEALTHY BLUE    Authorization - Visit Number 2    Authorization - Number of Visits 5    PT Start Time 1626    PT Stop Time 1709    PT Time Calculation (min) 43 min    Activity Tolerance Patient tolerated treatment well;Patient limited by pain    Behavior During Therapy WFL for tasks assessed/performed            Past Medical History:  Diagnosis Date   Anginal pain (HCC)    r/t anxiety   Anxiety    on meds   Asthma    no inhaler - seasonal   Constipation    Depression    on meds   Dysrhythmia    Palpatations - r/t anxiety   Genital warts    GERD (gastroesophageal reflux disease)    on meds   H/O hiatal hernia    surgery to repair   Headache(784.0)    Heart murmur    History of low transverse cesarean section 02/12/2018   MRSA cellulitis    greater than 10 yrs ago dx at urgent care    Seasonal allergies    Sickle cell trait (HCC)    Past Surgical History:  Procedure Laterality Date   CERVICAL CONE BIOPSY     CESAREAN SECTION  01/20/2012   Procedure: CESAREAN SECTION;  Surgeon: [name], MD;  Location: [facility];  Service: Obstetrics;  Laterality: N/A;   CESAREAN SECTION WITH BILATERAL TUBAL LIGATION N/A 02/13/2018   Procedure: CESAREAN SECTION WITH BILATERAL TUBAL LIGATION;  Surgeon: Sherian Rein, MD;  Location: [facility];  Service: Obstetrics;  Laterality: N/A;  Herbert Seta, RNFA  Needs [name]   COLONOSCOPY     DILATION AND EVACUATION N/A 03/30/2017   Procedure: DILATATION AND EVACUATION WITH ULTRASOUND GUIDANCE;  Surgeon: Sherian Rein, MD;  Location: [facility];   Service: Gynecology;  Laterality: N/A;   HERNIA REPAIR     INSERTION OF MESH N/A 05/15/2015   Procedure: INSERTION OF MESH;  Surgeon: Axel Filler, MD;  Location: [facility];  Service: General;  Laterality: N/A;   MARSUPIALIZATION URETHRAL DIVERTICULUM     UMBILICAL HERNIA REPAIR N/A 05/15/2015   Procedure: LAPAROSCOPIC UMBILICAL HERNIA;  Surgeon: Axel Filler, MD;  Location: [facility];  Service: General;  Laterality: N/A;   UPPER GI ENDOSCOPY     WISDOM TOOTH EXTRACTION     Patient Active Problem List   Diagnosis Date Noted   Allergy history, drug 02/23/2022   Snoring 06/08/2021   Screening for cardiovascular condition 12/07/2018   Mixed hyperlipidemia 12/07/2018   Benign gestational thrombocytopenia (HCC) 11/06/2018   Status post repeat low transverse cesarean section 02/13/2018   History of low transverse cesarean section 02/12/2018   Decreased fetal movement 02/07/2018   Migraine 09/03/2017   Maternal age 40+, multigravida, antepartum 07/24/2017   Maternal obesity affecting pregnancy, antepartum 07/24/2017   Constipation 07/24/2017   Sickle cell trait (HCC) 07/24/2017   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 pylori infection 11/02/2015   Intractable chronic migraine without aura and without status migrainosus 03/19/2015   Morbid obesity (HCC) 03/19/2015   Murmur 06/20/2012   INSOMNIA 08/14/2009   DYSMENORRHEA 12/09/2008   FATIGUE 03/25/2008   DEPRESSION/ANXIETY 09/11/2007   HEADACHE 09/11/2007   PALPITATIONS 02/23/2007   Atypical chest pain 02/23/2007   ALLERGIC RHINITIS 10/06/2006   ASTHMA 10/06/2006   GERD 10/06/2006   Asthma 10/06/2006    PCP: Norm Salt, PA  REFERRING PROVIDER: Drema Dallas, DO  REFERRING  DIAG: [phone] (ICD-10-CM) - Cervical radiculopathy  THERAPY DIAG:  Radiculopathy, cervical region  Pain in left arm  Cervicalgia  Rationale for Evaluation and Treatment: Rehabilitation  ONSET DATE: 1 year ago  SUBJECTIVE:                                                                                                                                                                                                         SUBJECTIVE STATEMENT: Patient denies changes in status.  She states she hurt more after last session.  She inquires about TPDN. Hand dominance: Right  PERTINENT HISTORY:  GERD, depression, anxiety, migraines, sickle cell trait, asthma  PAIN:  Are you having pain? Yes: NPRS scale: 6/10 Pain location: neck and shoulders and head Pain description: headache, achy, tightness Aggravating factors: stretches Relieving factors: unsure  PRECAUTIONS: None  RED FLAGS: None     WEIGHT BEARING RESTRICTIONS: No  FALLS:  Has patient fallen in last 6 months? 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 Everlena Cooper 12/06/2022  OBJECTIVE:   DIAGNOSTIC FINDINGS:  No recent relevant imaging.  PATIENT SURVEYS:  NDI 30/50 = 60% = moderate to high perceived disability  COGNITION: Overall cognitive status: Within functional limits for tasks assessed  SENSATION: Light touch: WFL and N/T down the entire left arm and intermittently in the right hand  POSTURE: rounded shoulders  PALPATION: Tightness in left upper trap, no cervical midline tenderness or excessive tightness noted  today, difficult to assess  suboccipitals due to dowager's hump and upper crossed syndrome impacting cervical posture   CERVICAL ROM:   Active ROM A/PROM (deg) eval  Flexion 29 degrees  Extension 14 degrees  Right lateral flexion 25 degrees  Left lateral flexion 31 degrees  Right rotation 26 degrees  Left rotation 30 degrees   (Blank rows = not tested)  UPPER EXTREMITY ROM:  Active ROM Right eval Left eval  Shoulder flexion All WNL, no pain during motion  Shoulder extension   Shoulder abduction   Shoulder adduction   Shoulder extension   Shoulder internal rotation   Shoulder external rotation   Elbow flexion   Elbow extension   Wrist flexion   Wrist extension   Wrist ulnar deviation    Wrist radial deviation    Wrist pronation    Wrist supination     (Blank rows = not tested)  UPPER EXTREMITY MMT:  MMT Right eval Left eval  Shoulder flexion 4-/5 4-/5  Shoulder extension    Shoulder abduction 4-/5; soreness in upper trap and base of neck 3+/5; soreness in upper trap and base of neck  Shoulder adduction    Shoulder extension    Shoulder internal rotation    Shoulder external rotation    Middle trapezius    Lower trapezius    Elbow flexion 5/5 5/5  Elbow extension    Wrist flexion 5/5 5/5  Wrist extension 5/5 4/5  Wrist ulnar deviation    Wrist radial deviation    Wrist pronation    Wrist supination    Grip strength     (Blank rows = not tested)  CERVICAL SPECIAL TESTS:  Upper limb tension test (ULTT): testing limited due to posterior arm soreness and pt guarding, she becomes tearful with initial median 1 positioning and Distraction test: +; pt reports relief of pressure and some tingling  FUNCTIONAL TESTS:  None completed due to lack of relevance to chief complaint  TODAY'S TREATMENT:                                                                                                                              DATE: 09/12/2022 -IASTM and STM to upper  traps, levator scapulae, and right rhomboids; notable trigger point in left upper traps w/ time spent mobilizing this w/ trigger point release. -Upper trap and levator stretch several reps each side, pt self paces > added theracane for introduction of alternate STM w/ edu on using a cane at home -Cervical extension SNAGs x20, return demo -Cervical rotation SNAGs x15 each side  PATIENT EDUCATION:  Education details: TPDN benefits/risks/possible sensation as pt is worried it would be painful for her - will hold off for now.  Possible reasons she was hurting after last session.  Discussed POC going forward with remaining 2 visits and likely discharge to self-management due to variability of symptoms and difficulty with symptom reproduction in treatment sessions aside from [facility].  Discussed benefit of ongoing STM  and stretching and strengthening to prevent knots from reforming. Person educated: Patient Education method: Explanation Education comprehension: verbalized understanding  HOME EXERCISE PROGRAM: Initiated HEP: Access Code: [email or web address] URL: [email or web address] Date: 08/29/2022 Prepared by: Camille Bal  Exercises - Supine Chin Tuck  - 1 x daily - 6 x weekly - 2 sets - 10 reps - 2 seconds hold - Supine Cervical Retraction with Towel  - 1 x daily - 6 x weekly - 1-2 sets - 15 reps - 2 seconds hold - Supine Segmental Cervical Flexion  - 1 x daily - 6 x weekly - 2 sets - 10 reps - Scapular retraction with resistance  - 1 x daily - 7 x weekly - 2 sets - 12 reps - Seated Upper Trapezius Stretch  - 1 x daily - 7 x weekly - 1 sets - 3 reps - 45 seconds hold - Seated Isometric Cervical Rotation  - 1 x daily - 7 x weekly - 2 sets - 10 reps - 3 seconds hold  ASSESSMENT:  CLINICAL IMPRESSION: Patient continues to have variable pain, but had a positive response to IASTM and trigger point release today.  PT introduced the theracane for patient to potentially use a variation at home  to safely continue manual therapy.  Her upper traps seem to be greatly contributing to her discomfort.  She would benefit from IASTM at future session as PT unable to completely work the left mid-trap trigger point out today.  She would also benefit from review and advancement of HEP.  Will continue per POC.  OBJECTIVE IMPAIRMENTS: decreased ROM, decreased strength, impaired sensation, improper body mechanics, postural dysfunction, and pain.   ACTIVITY LIMITATIONS: carrying, lifting, and reach over head  PARTICIPATION LIMITATIONS: meal prep, cleaning, laundry, shopping, community activity, and occupation  PERSONAL FACTORS: Fitness, Sex, Time since onset of injury/illness/exacerbation, and 1-2 comorbidities: anxiety, chronic migraines  are also affecting patient's functional outcome.   REHAB POTENTIAL: Good  CLINICAL DECISION MAKING: Stable/uncomplicated  EVALUATION COMPLEXITY: Low   GOALS: Goals reviewed with patient? Yes  SHORT TERM GOALS: Target date: 09/02/2022  Patient will tolerate positional testing for all ULTT in order to assess and treat for possible neurologic involvement and promote pain management. Baseline: Assessed, negative testing. Goal status: MET  LONG TERM GOALS: Target date: 09/23/2022  Pt will be independent and compliant with cervical and UE stretching, mobilizing, and strengthening HEP in order to maintain functional progress and promote pain and migraine management. Baseline: To be established. Goal status: INITIAL  2.  Patient will present with at least 2 less migraine days per week than baseline in order to demonstrate improved pain management. Baseline: 5 day long migraine at eval. Goal status: INITIAL  3.  Grip strength goal to be assessed and established as appropriate. Baseline: To be assessed. Goal status: INITIAL  PLAN:  PT FREQUENCY: 1x/week  PT DURATION: 6 weeks  PLANNED INTERVENTIONS: Therapeutic exercises, Therapeutic activity,  Neuromuscular re-education, Balance training, Gait training, Patient/Family education, Self Care, Joint mobilization, Spinal mobilization, Taping, Traction, Manual therapy, and Re-evaluation  PLAN FOR NEXT SESSION:  Add to HEP for self-mobilization.  Distraction mobilization, myofascial work, IASTM to upper traps, chirp wheel, no DN due to fear of needles, review and advance HEP - lifting technique, periscapular strength  Check all possible CPT codes: 40981 - PT Re-evaluation, 97110- Therapeutic Exercise, [phone]- Neuro Re-education, [phone] - Gait Training, [phone] - Manual Therapy, 97530 - Therapeutic Activities, 97535 - Self Care, and [id number] - Mechanical traction  Check all conditions that are expected to impact treatment: Psychological or psychiatric disorders  If treatment provided at initial evaluation, no treatment charged due to lack of authorization.   Sadie Haber, PT, DPT 09/12/2022, 5:29 PM

## 2022-09-19 ENCOUNTER — Ambulatory Visit: Payer: Medicaid Other | Admitting: Physical Therapy

## 2022-09-19 ENCOUNTER — Encounter: Payer: Self-pay | Admitting: Physical Therapy

## 2022-09-19 VITALS — BP 108/55 | HR 71

## 2022-09-19 DIAGNOSIS — M5412 Radiculopathy, cervical region: Secondary | ICD-10-CM

## 2022-09-19 DIAGNOSIS — M542 Cervicalgia: Secondary | ICD-10-CM

## 2022-09-19 DIAGNOSIS — M79602 Pain in left arm: Secondary | ICD-10-CM

## 2022-09-19 NOTE — Patient Instructions (Signed)
Access Code: [email or web address] URL: [email or web address] Date: 09/19/2022 Prepared by: Camille Bal  Exercises - Supine Chin Tuck  - 1 x daily - 6 x weekly - 2 sets - 10 reps - 2 seconds hold - Supine Cervical Retraction with Towel  - 1 x daily - 6 x weekly - 1-2 sets - 15 reps - 2 seconds hold - Supine Segmental Cervical Flexion  - 1 x daily - 6 x weekly - 2 sets - 10 reps - Scapular retraction with resistance  - 1 x daily - 7 x weekly - 2 sets - 12 reps - Seated Upper Trapezius Stretch  - 1 x daily - 7 x weekly - 1 sets - 3 reps - 45 seconds hold - Seated Isometric Cervical Rotation  - 1 x daily - 7 x weekly - 2 sets - 10 reps - 3 seconds hold - Standing Shoulder Row with Anchored Resistance  - 1 x daily - 7 x weekly - 3 sets - 12 reps - 1-2 seconds hold - Doorway Pec Stretch at 60 Elevation  - 1 x daily - 7 x weekly - 1 sets - 2-3 reps - 45 seconds hold - Doorway Pec Stretch at 90 Degrees Abduction  - 1 x daily - 7 x weekly - 1 sets - 2-3 reps - 45 seconds hold - Doorway Pec Stretch at 120 Degrees Abduction  - 1 x daily - 7 x weekly - 1 sets - 2-3 reps - 45 seconds hold

## 2022-09-19 NOTE — Therapy (Signed)
OUTPATIENT PHYSICAL THERAPY CERVICAL TREATMENT   Patient Name: Amanda [name] MRN: [id number] DOB:1981-01-14, 42 y.o., female Today's Date: 09/19/2022  END OF SESSION:  PT End of Session - 09/19/22 1626     Visit Number 4    Number of Visits 7   6 + eval   Date for PT Re-Evaluation 09/30/22   pushed out due to scheduling delay   Authorization Type [place] MEDICAID HEALTHY BLUE    Authorization - Visit Number 3    Authorization - Number of Visits 5    PT Start Time 1617    PT Stop Time 1700    PT Time Calculation (min) 43 min    Activity Tolerance Patient tolerated treatment well    Behavior During Therapy WFL for tasks assessed/performed            Past Medical History:  Diagnosis Date   Anginal pain (HCC)    r/t anxiety   Anxiety    on meds   Asthma    no inhaler - seasonal   Constipation    Depression    on meds   Dysrhythmia    Palpatations - r/t anxiety   Genital warts    GERD (gastroesophageal reflux disease)    on meds   H/O hiatal hernia    surgery to repair   Headache(784.0)    Heart murmur    History of low transverse cesarean section 02/12/2018   MRSA cellulitis    greater than 10 yrs ago dx at urgent care    Seasonal allergies    Sickle cell trait (HCC)    Past Surgical History:  Procedure Laterality Date   CERVICAL CONE BIOPSY     CESAREAN SECTION  01/20/2012   Procedure: CESAREAN SECTION;  Surgeon: [name], MD;  Location: [facility];  Service: Obstetrics;  Laterality: N/A;   CESAREAN SECTION WITH BILATERAL TUBAL LIGATION N/A 02/13/2018   Procedure: CESAREAN SECTION WITH BILATERAL TUBAL LIGATION;  Surgeon: Sherian Rein, MD;  Location: [facility];  Service: Obstetrics;  Laterality: N/A;  Herbert Seta, RNFA  Needs [name]   COLONOSCOPY     DILATION AND EVACUATION N/A 03/30/2017   Procedure: DILATATION AND EVACUATION WITH ULTRASOUND GUIDANCE;  Surgeon: Sherian Rein, MD;  Location: [facility];  Service: Gynecology;   Laterality: N/A;   HERNIA REPAIR     INSERTION OF MESH N/A 05/15/2015   Procedure: INSERTION OF MESH;  Surgeon: Axel Filler, MD;  Location: [facility];  Service: General;  Laterality: N/A;   MARSUPIALIZATION URETHRAL DIVERTICULUM     UMBILICAL HERNIA REPAIR N/A 05/15/2015   Procedure: LAPAROSCOPIC UMBILICAL HERNIA;  Surgeon: Axel Filler, MD;  Location: [facility];  Service: General;  Laterality: N/A;   UPPER GI ENDOSCOPY     WISDOM TOOTH EXTRACTION     Patient Active Problem List   Diagnosis Date Noted   Allergy history, drug 02/23/2022   Snoring 06/08/2021   Screening for cardiovascular condition 12/07/2018   Mixed hyperlipidemia 12/07/2018   Benign gestational thrombocytopenia (HCC) 11/06/2018   Status post repeat low transverse cesarean section 02/13/2018   History of low transverse cesarean section 02/12/2018   Decreased fetal movement 02/07/2018   Migraine 09/03/2017   Maternal age 5+, multigravida, antepartum 07/24/2017   Maternal obesity affecting pregnancy, antepartum 07/24/2017   Constipation 07/24/2017   Sickle cell trait (HCC) 07/24/2017   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 pylori infection 11/02/2015   Intractable chronic migraine without aura and without status migrainosus 03/19/2015   Morbid obesity (HCC) 03/19/2015   Murmur 06/20/2012   INSOMNIA 08/14/2009   DYSMENORRHEA 12/09/2008   FATIGUE 03/25/2008   DEPRESSION/ANXIETY 09/11/2007   HEADACHE 09/11/2007   PALPITATIONS 02/23/2007   Atypical chest pain 02/23/2007   ALLERGIC RHINITIS 10/06/2006   ASTHMA 10/06/2006   GERD 10/06/2006   Asthma 10/06/2006    PCP: Norm Salt, PA  REFERRING PROVIDER: Drema Dallas, DO  REFERRING DIAG: [phone] (ICD-10-CM)  - Cervical radiculopathy  THERAPY DIAG:  Radiculopathy, cervical region  Pain in left arm  Cervicalgia  Rationale for Evaluation and Treatment: Rehabilitation  ONSET DATE: 1 year ago  SUBJECTIVE:                                                                                                                                                                                                         SUBJECTIVE STATEMENT: Patient denies changes in status.  She has a headache today.  Hand dominance: Right  PERTINENT HISTORY:  GERD, depression, anxiety, migraines, sickle cell trait, asthma  PAIN:  Are you having pain? Yes: NPRS scale: 7/10 Pain location: headache Pain description: headache Aggravating factors: stretches Relieving factors: unsure  PRECAUTIONS: None  RED FLAGS: None     WEIGHT BEARING RESTRICTIONS: No  FALLS:  Has patient fallen in last 6 months? 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 Everlena Cooper 12/06/2022  OBJECTIVE:   DIAGNOSTIC FINDINGS:  No recent relevant imaging.  PATIENT SURVEYS:  NDI 30/50 = 60% = moderate to high perceived disability  COGNITION: Overall cognitive status: Within functional limits for tasks assessed  SENSATION: Light touch: WFL and N/T down the entire left arm and intermittently in the right hand  POSTURE: rounded shoulders  PALPATION: Tightness in left upper trap, no cervical midline tenderness or excessive tightness noted today, difficult to assess suboccipitals due to dowager's hump and upper crossed syndrome impacting  cervical posture   CERVICAL ROM:  Active ROM A/PROM (deg) eval  Flexion 29 degrees  Extension 14 degrees  Right lateral flexion 25 degrees  Left lateral flexion 31 degrees  Right rotation 26 degrees  Left rotation 30 degrees   (Blank rows = not tested)  UPPER EXTREMITY ROM:  Active ROM Right eval Left eval  Shoulder flexion All WNL, no pain during motion  Shoulder extension   Shoulder abduction   Shoulder adduction   Shoulder extension   Shoulder internal rotation   Shoulder external rotation   Elbow flexion   Elbow extension   Wrist flexion   Wrist extension   Wrist ulnar deviation    Wrist radial deviation    Wrist pronation    Wrist supination     (Blank rows = not tested)  UPPER EXTREMITY MMT:  MMT Right eval Left eval  Shoulder flexion 4-/5 4-/5  Shoulder extension    Shoulder abduction 4-/5; soreness in upper trap and base of neck 3+/5; soreness in upper trap and base of neck  Shoulder adduction    Shoulder extension    Shoulder internal rotation    Shoulder external rotation    Middle trapezius    Lower trapezius    Elbow flexion 5/5 5/5  Elbow extension    Wrist flexion 5/5 5/5  Wrist extension 5/5 4/5  Wrist ulnar deviation    Wrist radial deviation    Wrist pronation    Wrist supination    Grip strength     (Blank rows = not tested)  CERVICAL SPECIAL TESTS:  Upper limb tension test (ULTT): testing limited due to posterior arm soreness and pt guarding, she becomes tearful with initial median 1 positioning and Distraction test: +; pt reports relief of pressure and some tingling  FUNCTIONAL TESTS:  None completed due to lack of relevance to chief complaint  TODAY'S TREATMENT:                                                                                                                              DATE: 09/19/2022 -IASTM and STM to upper traps, levator scapulae, and bilateral rhomboids/periscapular regions and cervical paraspinals -Arm  bike x8 minutes split: 4 minutes forward level 1.0-2.0 and reverse level 2.0 decreased to 1.0 for dynamic UE movement and strength, pt has anterior shoulder tightness during task -Demonstrated chirp wheel for cervical manual release with resource provided for patient to purchase should she desire.  Had patient spend 8 minutes practicing placement for various types of suboccipital and paraspinal pressure/manual release. -Doorway pec stretch unilaterally 2x45 seconds each UE in each position (at 60 degrees, 90 degrees, and 120 degrees) -Standing row w/ red theraband 3x12 cued for form and squeezing at end range  PATIENT EDUCATION:  Education details: Resources to purchase chirp wheel, sizing and purpose options, and positioning for manual release at home.  Additions to HEP. Person educated: Patient Education method: Explanation Education comprehension: verbalized understanding  HOME EXERCISE PROGRAM: Access Code: [email or web address]  URL: [email or web address] Date: 09/19/2022 Prepared by: Camille Bal  Exercises - Supine Chin Tuck  - 1 x daily - 6 x weekly - 2 sets - 10 reps - 2 seconds hold - Supine Cervical Retraction with Towel  - 1 x daily - 6 x weekly - 1-2 sets - 15 reps - 2 seconds hold - Supine Segmental Cervical Flexion  - 1 x daily - 6 x weekly - 2 sets - 10 reps - Scapular retraction with resistance  - 1 x daily - 7 x weekly - 2 sets - 12 reps - Seated Upper Trapezius Stretch  - 1 x daily - 7 x weekly - 1 sets - 3 reps - 45 seconds hold - Seated Isometric Cervical Rotation  - 1 x daily - 7 x weekly - 2 sets - 10 reps - 3 seconds hold - Standing Shoulder Row with Anchored Resistance  - 1 x daily - 7 x weekly - 3 sets - 12 reps - 1-2 seconds hold - Doorway Pec Stretch at 60 Elevation  - 1 x daily - 7 x weekly - 1 sets - 2-3 reps - 45 seconds hold - Doorway Pec Stretch at 90 Degrees Abduction  - 1 x daily - 7 x weekly - 1 sets - 2-3 reps - 45 seconds hold - Doorway Pec  Stretch at 120 Degrees Abduction  - 1 x daily - 7 x weekly - 1 sets - 2-3 reps - 45 seconds hold  ASSESSMENT:  CLINICAL IMPRESSION: Ongoing IASTM with positive pain response today.  She found benefit from use of the chirp wheel and may do well purchasing one for use in home.  Continued to add to HEP for postural support and correction.  Will assess LTGs next session with plan for discharge.  OBJECTIVE IMPAIRMENTS: decreased ROM, decreased strength, impaired sensation, improper body mechanics, postural dysfunction, and pain.   ACTIVITY LIMITATIONS: carrying, lifting, and reach over head  PARTICIPATION LIMITATIONS: meal prep, cleaning, laundry, shopping, community activity, and occupation  PERSONAL FACTORS: Fitness, Sex, Time since onset of injury/illness/exacerbation, and 1-2 comorbidities: anxiety, chronic migraines  are also affecting patient's functional outcome.   REHAB POTENTIAL: Good  CLINICAL DECISION MAKING: Stable/uncomplicated  EVALUATION COMPLEXITY: Low   GOALS: Goals reviewed with patient? Yes  SHORT TERM GOALS: Target date: 09/02/2022  Patient will tolerate positional testing for all ULTT in order to assess and treat for possible neurologic involvement and promote pain management. Baseline: Assessed, negative testing. Goal status: MET  LONG TERM GOALS: Target date: 09/23/2022  Pt will be independent and compliant with cervical and UE stretching, mobilizing, and strengthening HEP in order to maintain functional progress and promote pain and migraine management. Baseline: To be established. Goal status: INITIAL  2.  Patient will present with at least 2 less migraine days per week than baseline in order to demonstrate improved pain management. Baseline: 5 day long migraine at eval. Goal status: INITIAL  3.  Grip strength goal to be assessed and established as appropriate. Baseline: To be assessed. Goal status: INITIAL  PLAN:  PT FREQUENCY: 1x/week  PT DURATION:  6 weeks  PLANNED INTERVENTIONS: Therapeutic exercises, Therapeutic activity, Neuromuscular re-education, Balance training, Gait training, Patient/Family education, Self Care, Joint mobilization, Spinal mobilization, Taping, Traction, Manual therapy, and Re-evaluation  PLAN FOR NEXT SESSION:  Lifting technique, assess LTGs - discharge!  Check all possible CPT codes: 40981 - PT Re-evaluation, 97110- Therapeutic Exercise, [phone]- Neuro Re-education, [phone] - Gait Training, [phone] - Manual Therapy, 97530 - Therapeutic  Activities, 97535 - Self Care, and 27253 - Mechanical traction    Check all conditions that are expected to impact treatment: Psychological or psychiatric disorders  If treatment provided at initial evaluation, no treatment charged due to lack of authorization.   Sadie Haber, PT, DPT 09/19/2022, 5:42 PM

## 2022-09-27 ENCOUNTER — Ambulatory Visit: Payer: Medicaid Other | Admitting: Physical Therapy

## 2022-10-04 ENCOUNTER — Ambulatory Visit: Payer: Medicaid Other | Attending: Neurology | Admitting: Physical Therapy

## 2022-11-21 NOTE — Progress Notes (Signed)
Declined SDOH.

## 2022-11-24 ENCOUNTER — Encounter (HOSPITAL_COMMUNITY): Payer: Self-pay

## 2022-11-24 ENCOUNTER — Emergency Department (HOSPITAL_COMMUNITY)
Admission: EM | Admit: 2022-11-24 | Discharge: 2022-11-25 | Discharge status: Against Medical Advice | Payer: Medicaid Other | Attending: Emergency Medicine | Admitting: Emergency Medicine

## 2022-11-24 DIAGNOSIS — R319 Hematuria, unspecified: Secondary | ICD-10-CM | POA: Diagnosis not present

## 2022-11-24 DIAGNOSIS — R030 Elevated blood-pressure reading, without diagnosis of hypertension: Secondary | ICD-10-CM | POA: Diagnosis not present

## 2022-11-24 DIAGNOSIS — R519 Headache, unspecified: Secondary | ICD-10-CM | POA: Diagnosis not present

## 2022-11-24 DIAGNOSIS — Z5329 Procedure and treatment not carried out because of patient's decision for other reasons: Secondary | ICD-10-CM | POA: Diagnosis not present

## 2022-11-24 DIAGNOSIS — D509 Iron deficiency anemia, unspecified: Secondary | ICD-10-CM | POA: Diagnosis not present

## 2022-11-24 DIAGNOSIS — N390 Urinary tract infection, site not specified: Secondary | ICD-10-CM | POA: Insufficient documentation

## 2022-11-24 DIAGNOSIS — R0602 Shortness of breath: Secondary | ICD-10-CM | POA: Diagnosis present

## 2022-11-24 LAB — CBC
HCT: 35.6 % — ABNORMAL LOW (ref 36.0–46.0)
Hemoglobin: 11.3 g/dL — ABNORMAL LOW (ref 12.0–15.0)
MCH: 24.5 pg — ABNORMAL LOW (ref 26.0–34.0)
MCHC: 31.7 g/dL (ref 30.0–36.0)
MCV: 77.2 fL — ABNORMAL LOW (ref 80.0–100.0)
Platelets: 225 10*3/uL (ref 150–400)
RBC: 4.61 MIL/uL (ref 3.87–5.11)
RDW: 15.9 % — ABNORMAL HIGH (ref 11.5–15.5)
WBC: 9.3 10*3/uL (ref 4.0–10.5)
nRBC: 0 % (ref 0.0–0.2)

## 2022-11-24 LAB — URINALYSIS, ROUTINE W REFLEX MICROSCOPIC
Bilirubin Urine: NEGATIVE
Glucose, UA: NEGATIVE mg/dL
Ketones, ur: NEGATIVE mg/dL
Nitrite: NEGATIVE
Protein, ur: NEGATIVE mg/dL
Specific Gravity, Urine: 1.006 (ref 1.005–1.030)
WBC, UA: >50 WBC/hpf (ref 0–5)
pH: 6 (ref 5.0–8.0)

## 2022-11-24 LAB — BASIC METABOLIC PANEL
Anion gap: 10 (ref 5–15)
BUN: 10 mg/dL (ref 6–20)
CO2: 25 mmol/L (ref 22–32)
Calcium: 9.2 mg/dL (ref 8.9–10.3)
Chloride: 102 mmol/L (ref 98–111)
Creatinine, Ser: 0.77 mg/dL (ref 0.44–1.00)
GFR, Estimated: >60 mL/min (ref >60)
Glucose, Bld: 90 mg/dL (ref 70–99)
Potassium: 3.8 mmol/L (ref 3.5–5.1)
Sodium: 137 mmol/L (ref 135–145)

## 2022-11-24 LAB — HCG, SERUM, QUALITATIVE: Preg, Serum: NEGATIVE

## 2022-11-24 NOTE — ED Triage Notes (Signed)
Pt is coming in with [facility], Chest pain, bilateral lower leg pain, general fatigue. She mentions she has the sickle cell trait as well. Pt endorses she had iron test done 3 weeks ago that mentioned her iron levels being low and she feels like she is anemic now but has never had a blood transfusion done for this before. She is tearful during triage but cooperative.

## 2022-11-25 ENCOUNTER — Other Ambulatory Visit: Payer: Self-pay

## 2022-11-25 MED ORDER — SODIUM CHLORIDE 0.9 % IV SOLN
2.0000 g | Freq: Once | INTRAVENOUS | Status: AC
  Administered 2022-11-25: 2 g via INTRAVENOUS
  Filled 2022-11-25: qty 20

## 2022-11-25 MED ORDER — CEFADROXIL 500 MG PO CAPS: 500.0000 mg | ORAL_CAPSULE | Freq: Two times a day (BID) | ORAL | 0 refills | Status: DC

## 2022-11-25 MED ORDER — ONDANSETRON 4 MG PO TBDP: 4.0000 mg | ORAL_TABLET | Freq: Three times a day (TID) | ORAL | 0 refills | Status: DC | PRN

## 2022-11-25 MED ORDER — PROCHLORPERAZINE EDISYLATE 10 MG/2ML IJ SOLN
10.0000 mg | Freq: Once | INTRAMUSCULAR | Status: AC
  Administered 2022-11-25: 10 mg via INTRAVENOUS
  Filled 2022-11-25: qty 2

## 2022-11-25 MED ORDER — SODIUM CHLORIDE 0.9 % IV BOLUS
1000.0000 mL | Freq: Once | INTRAVENOUS | Status: AC
  Administered 2022-11-25: 1000 mL via INTRAVENOUS

## 2022-11-25 NOTE — ED Notes (Signed)
Pt entered hallway, pt had removed monitor and gown and dressed back in her clothing. Pt tearful and informed this RN she wants to leave because "my anxiety is too bad." MD Preston Fleeting informed. Pt agreeable to stay and see MD at this time. Reassurance provided to patient.

## 2022-11-25 NOTE — ED Provider Notes (Signed)
[place] EMERGENCY DEPARTMENT AT [facility] Provider Note   CSN: [id number] Arrival date & time: 11/24/22  2050     History  Chief Complaint  Patient presents with   Shortness of Breath    Amanda [name] is a 42 y.o. female.  The history is provided by the patient.  Shortness of Breath She has history of anxiety, sickle cell trait, anemia and comes in because she has just been generally feeling bad for the last week.  She complains of headache, feeling dizzy, weak, and lightheaded, nausea.  She denies fever or chills.  She denies cough.  She does endorse urinary urgency, frequency, tenesmus.  She is worried that her anemia may be worse.  She has been taking her son's ondansetron which has been giving her relief of nausea.   Home Medications Prior to Admission medications   Medication Sig Start Date End Date Taking? Authorizing Provider  albuterol (VENTOLIN HFA) 108 (90 Base) MCG/ACT inhaler Inhale 2 puffs into the lungs every 6 (six) hours as needed. Patient not taking: Reported on 01/06/2022 10/18/18   [provider]  amoxicillin (AMOXIL) 250 MG capsule Take 3 capsules (750 mg total) by mouth 3 (three) times daily. 02/09/22   Gardiner Barefoot, MD  cetirizine (ZYRTEC) 10 MG tablet Take 10 mg by mouth daily. Patient not taking: Reported on 01/06/2022 10/18/18   [provider]  cyclobenzaprine (FLEXERIL) 10 MG tablet May use every 8 hours for headache Patient not taking: Reported on 01/06/2022 06/04/21   Drema Dallas, DO  dicyclomine (BENTYL) 10 MG capsule Take 1 capsule (10 mg total) by mouth every 6 (six) hours as needed for spasms. 10/29/21   Cirigliano, Vito V, DO  esomeprazole (NEXIUM) 40 MG capsule Take 1 capsule (40 mg total) by mouth 2 (two) times daily before a meal. 10/29/21   Cirigliano, Vito V, DO  famotidine (PEPCID) 20 MG tablet Take 20 mg by mouth 2 (two) times daily. 03/16/21   [provider]  fluticasone (FLONASE) 50  MCG/ACT nasal spray Place 2 sprays into both nostrils daily. Patient not taking: Reported on 01/06/2022 02/12/21   Zenia Resides, MD  levofloxacin (LEVAQUIN) 500 MG tablet Take 1 tablet (500 mg total) by mouth daily. 02/09/22   Gardiner Barefoot, MD  linaclotide Karlene Einstein) 145 MCG CAPS capsule Take 1 capsule (145 mcg total) by mouth daily before breakfast. Patient not taking: Reported on 12/08/2021 10/29/21 10/24/22  Cirigliano, Vito V, DO  ondansetron (ZOFRAN ODT) 4 MG disintegrating tablet Take 1 tablet (4 mg total) by mouth every 8 (eight) hours as needed for nausea or vomiting. 05/30/22   Drema Dallas, DO  SUMAtriptan ([name]) 20 MG/ACT nasal spray 1 spray in nostril.  May repeat x1 in 2 hours if headache persists or recurs. 05/30/22   Drema Dallas, DO  venlafaxine XR (EFFEXOR-XR) 37.5 MG 24 hr capsule Take 1 capsule (37.5 mg total) by mouth daily with breakfast. 05/30/22   Drema Dallas, DO      Allergies    Aimovig [erenumab-aooe], Doxycycline hyclate, and Ketorolac tromethamine    Review of Systems   Review of Systems  Respiratory:  Positive for shortness of breath.   All other systems reviewed and are negative.   Physical Exam Updated Vital Signs BP (!) 147/73   Pulse 79   Temp 98.7 F (37.1 C)   Resp 14   SpO2 100%  Physical Exam Vitals and nursing note reviewed.   42  year old female, very anxious, but is in no acute distress. Vital signs are significant for mildly elevated blood pressure. Oxygen saturation is 100%, which is normal. Head is normocephalic and atraumatic. PERRLA, EOMI. Oropharynx is clear. Neck is nontender and supple. Back is nontender and there is no CVA tenderness. Lungs are clear without rales, wheezes, or rhonchi. Chest is nontender. Heart has regular rate and rhythm without murmur. Abdomen is soft, flat, nontender. Extremities have no cyanosis or edema, full range of motion is present. Skin is warm and dry without rash. Neurologic: Awake and alert but  very anxious, moves all extremities equally.  ED Results / Procedures / Treatments   Labs (all labs ordered are listed, but only abnormal results are displayed) Labs Reviewed  CBC - Abnormal; Notable for the following components:      Result Value   Hemoglobin 11.3 (*)    HCT 35.6 (*)    MCV 77.2 (*)    MCH 24.5 (*)    RDW 15.9 (*)    All other components within normal limits  URINALYSIS, ROUTINE W REFLEX MICROSCOPIC - Abnormal; Notable for the following components:   APPearance HAZY (*)    Hgb urine dipstick MODERATE (*)    Leukocytes,Ua LARGE (*)    Bacteria, UA RARE (*)    All other components within normal limits  BASIC METABOLIC PANEL  HCG, SERUM, QUALITATIVE    EKG EKG Interpretation Date/Time:  Thursday November 24 2022 20:57:56 EDT Ventricular Rate:  88 PR Interval:  152 QRS Duration:  74 QT Interval:  342 QTC Calculation: 413 R Axis:   70  Text Interpretation: Normal sinus rhythm Right atrial enlargement Borderline ECG When compared with ECG of 17-Apr-2019 17:02, No significant change was found Confirmed by Dione Booze ([id number]) on 11/24/2022 11:11:01 PM  Radiology No results found.  Procedures Procedures    Medications Ordered in ED Medications - No data to display  ED Course/ Medical Decision Making/ A&P                                 Medical Decision Making Amount and/or Complexity of Data Reviewed Labs: ordered.  Risk Prescription drug management.   Malaise with headache and urinary symptoms.  Consider viral illness, urinary tract infection, worsening anemia.  I have reviewed her laboratory tests, and my interpretation is normal basic metabolic panel, mild microcytic anemia with hemoglobin approximately 1 g lower than on 03/22/2022 but in a range she has been at in the past.  Urinalysis is significant for pyuria with greater than 50 WBCs as well as mild hematuria with 10-20 RBCs.  I suspect that most of her symptoms are related to urinary tract  infection.  I have discussed this with the patient, I do not feel her anemia is significant.  I have ordered a headache cocktail of IV fluids, prochlorperazine for headache, and a dose of ceftriaxone for her urinary tract infection.  Patient eloped following getting her ceftriaxone before completing the full bolus of IV fluids.  I had already sent in prescriptions for cefadroxil and ondansetron oral dissolving tablet.  Final Clinical Impression(s) / ED Diagnoses Final diagnoses:  Urinary tract infection with hematuria, site unspecified  Bad headache  Microcytic anemia    Rx / DC Orders ED Discharge Orders          Ordered    cefadroxil (DURICEF) 500 MG capsule  2 times daily  11/25/22 0247    ondansetron (ZOFRAN ODT) 4 MG disintegrating tablet  Every 8 hours PRN        11/25/22 0247              Dione Booze, MD 11/25/22 [phone]

## 2022-11-25 NOTE — ED Notes (Signed)
This RN entered pts room to address IV pump beeping. Pt not in room. IV removed and IVF and abx flowing into floor. Pts belongings no longer in room. Pt not in surrounding areas. MD Preston Fleeting made aware.

## 2022-11-25 NOTE — ED Notes (Signed)
Pt resting in bed. Pt provided with warm blankets and water. No needs expressed at this time. Call bell in reach.

## 2022-12-05 NOTE — Progress Notes (Unsigned)
NEUROLOGY FOLLOW UP OFFICE NOTE  ALYTHIA RORKE [id number]  Assessment/Plan:   Migraine without aura, without status migrainosus, not intractable - aggravated by underlying emotional stress Left C8 radiculopathy   Migraine prevention:  Start venlafaxine XR 37.5mg  daily *** Migraine rescue:  Sumatriptan NS or Midol, Flexeril.  Zofran for nausea *** *** Limit use of pain relievers to no more than 2 days out of week to prevent risk of rebound or medication-overuse headache. Keep headache diary Follow up 6 months.     Subjective:  Ishara Beaudet is a 42 year old right-handed woman with asthma, depression, anxiety and morbid obesity who follows up for chronic migraine.     UPDATE: Started venlafaxine *** Intensity:  moderate Duration:  60 minutes and nap with sumatriptan *** Frequency:  15 headache days in April (4 were severe) ***  Now  having pain and numbness in the last 2 fingers of her left hand radiating up the ulnar aspect of her forearm and lateral arm to the shoulder but the neck.  Had NCV-EMG on 08/01/2022 which revealed evidence of mild chronic C8 radiculopathy but no evidence of left ulnar neuropathy or carpal tunnel syndrome.  She was referred to PT for left C8 cervical radiculopathy.  ***  Rescue protocol:  sumatriptan NS Current NSAIDS:  none Current analgesics:  none Current triptans:  sumatriptan 20mg  NS Current ergotamine:  none Current anti-emetic:  Zofran ODT 4mg  Current muscle relaxants:  Flexeril 10mg  TID PRN headACHE Current anti-anxiolytic:  none Current sleep aide:  none Current Antihypertensive medications:  none Current Antidepressant medications:  venlafaxine XR 37.5mg  daily Current Anticonvulsant medications:  gabapentin 300mg  PRN Current anti-CGRP: none Current Vitamins/Herbal/Supplements:  prenatal Current Antihistamines/Decongestants:  Flonase, Zyrtec Other therapy:  chiropractic medicine Hormone/birth control:  none    Caffeine:  no Alcohol:  no Smoker:  no Diet:  Cut out pork, cheese, chocolate.  Increased water intake Exercise:  walking Depression/stress:  stress Sleep hygiene:  poor   HISTORY: Onset:  adolescence Location:  Holocephalic from neck and shoulders to behind the eyes Quality:  Throbbing, pressure Initial intensity:  10/10 Aura:  no Prodrome:  no Associated symptoms:  Dizziness, nausea, photophobia, phonophobia Initial Duration:  constant Initial Frequency:  Daily (16-20 days severe) Triggers:  Emotional stress, getting upset, menstrual cycle, certain perfumes, sun light Relieving factors:  none Activity:  Difficult to function when severe   Her headaches improved during her pregnancy but returned post-partum.   Due to worsening headaches, an MRI of the brain with and without contrast was performed on 11/04/2018 and was normal.   Past NSAIDS:  Advil, Aleve Past analgesics:  BC powder, Tylenol, Midol Past abortive triptans:  Sumatriptan 100mg , sumatriptan NS, Maxalt, Relpax Past abortive ergotamine:  none Past muscle relaxants:  tizanidine Past anti-emetic:  Zofran Past antihypertensive medications:  would not use beta blockers due to underlying asthma. Past antidepressant medications:  Nortriptyline 25mg  (side effects), Zoloft Past anticonvulsant medications:  topiramate 100mg  (ineffective) Past anti-CGRP:  Aimovig 70mg  (helped but it made her feel hot, lightheaded, nauseous with vomiting), Emgality, Nurtec (rescue), Qulipta 60mg  (caused nausea).  Would rather avoid injection Past vitamins/Herbal/Supplements:  none Past antihistamines/decongestants:  none Other past therapies:  Trigger point injections   Family history of headaches:  No  PAST MEDICAL HISTORY: Past Medical History:  Diagnosis Date   Anginal pain (HCC)    r/t anxiety   Anxiety    on meds   Asthma    no inhaler - seasonal  Constipation    Depression    on meds   Dysrhythmia    Palpatations - r/t  anxiety   Genital warts    GERD (gastroesophageal reflux disease)    on meds   H/O hiatal hernia    surgery to repair   Headache(784.0)    Heart murmur    History of low transverse cesarean section 02/12/2018   MRSA cellulitis    greater than 10 yrs ago dx at urgent care    Seasonal allergies    Sickle cell trait (HCC)     MEDICATIONS: Current Outpatient Medications on File Prior to Visit  Medication Sig Dispense Refill   cefadroxil (DURICEF) 500 MG capsule Take 1 capsule (500 mg total) by mouth 2 (two) times daily. 14 capsule 0   dicyclomine (BENTYL) 10 MG capsule Take 1 capsule (10 mg total) by mouth every 6 (six) hours as needed for spasms. 30 capsule 3   esomeprazole (NEXIUM) 40 MG capsule Take 1 capsule (40 mg total) by mouth 2 (two) times daily before a meal. 180 capsule 3   famotidine (PEPCID) 20 MG tablet Take 20 mg by mouth 2 (two) times daily.     fluticasone (FLONASE) 50 MCG/ACT nasal spray Place 2 sprays into both nostrils daily. (Patient not taking: Reported on 01/06/2022) 16 g 0   linaclotide (LINZESS) 145 MCG CAPS capsule Take 1 capsule (145 mcg total) by mouth daily before breakfast. (Patient not taking: Reported on 12/08/2021) 90 capsule 3   ondansetron (ZOFRAN ODT) 4 MG disintegrating tablet Take 1 tablet (4 mg total) by mouth every 8 (eight) hours as needed for nausea or vomiting. 20 tablet 0   SUMAtriptan ([name]) 20 MG/ACT nasal spray 1 spray in nostril.  May repeat x1 in 2 hours if headache persists or recurs. 10 each 5   venlafaxine XR (EFFEXOR-XR) 37.5 MG 24 hr capsule Take 1 capsule (37.5 mg total) by mouth daily with breakfast. 90 capsule 2   No current facility-administered medications on file prior to visit.    ALLERGIES: Allergies  Allergen Reactions   Aimovig [Erenumab-Aooe]     Hot/lightheaded/nausea and vomiting   Doxycycline Hyclate Hives, Itching and Swelling   Ketorolac Tromethamine Other (See Comments)    Patient stated that it makes her  extremely hyper    FAMILY HISTORY: Family History  Problem Relation Age of Onset   Hypertension Mother    Depression Mother    Depression Father    Breast cancer Maternal Aunt        Great Aunt   Cancer Maternal Aunt    Heart disease Maternal Grandmother    Diabetes Maternal Grandfather    Heart disease Maternal Grandfather    Hypertension Maternal Grandfather    Congestive Heart Failure Paternal Grandfather    Anesthesia problems Neg Hx    Colon polyps Neg Hx    Colon cancer Neg Hx    Esophageal cancer Neg Hx    Stomach cancer Neg Hx    Rectal cancer Neg Hx       Objective:  *** General: No acute distress.  Patient appears ***-groomed.   Head:  Normocephalic/atraumatic Eyes:  Fundi examined but not visualized Neck: supple, no paraspinal tenderness, full range of motion Heart:  Regular rate and rhythm Lungs:  Clear to auscultation bilaterally Back: No paraspinal tenderness Neurological Exam: alert and oriented.  Speech fluent and not dysarthric, language intact.  CN II-XII intact. Bulk and tone normal, muscle strength 5/5 throughout.  Sensation to light  touch intact.  Deep tendon reflexes 2+ throughout, toes downgoing.  Finger to nose testing intact.  Gait normal, Romberg negative.   Shon Millet, DO  CC: ***

## 2022-12-06 ENCOUNTER — Ambulatory Visit (INDEPENDENT_AMBULATORY_CARE_PROVIDER_SITE_OTHER): Payer: Medicaid Other | Admitting: Neurology

## 2022-12-06 ENCOUNTER — Encounter: Payer: Self-pay | Admitting: Neurology

## 2022-12-06 VITALS — BP 98/68 | HR 84 | Ht 63.0 in | Wt 271.0 lb

## 2022-12-06 DIAGNOSIS — M5412 Radiculopathy, cervical region: Secondary | ICD-10-CM | POA: Diagnosis not present

## 2022-12-06 DIAGNOSIS — G43709 Chronic migraine without aura, not intractable, without status migrainosus: Secondary | ICD-10-CM

## 2022-12-06 MED ORDER — SUMATRIPTAN 20 MG/ACT NA SOLN: NASAL | 5 refills | Status: AC

## 2022-12-06 MED ORDER — ONDANSETRON 4 MG PO TBDP: 4.0000 mg | ORAL_TABLET | Freq: Three times a day (TID) | ORAL | 5 refills | Status: AC | PRN

## 2022-12-06 NOTE — Patient Instructions (Addendum)
Refer to Sports Medicine for treatment of neck pain/left sided cervical radiculopathy.  See contact information below.  If you do not hear from anybody in one week, contact office to schedule.  Make sure they didn't leave a message on your voicemail. Sumatriptan nasal spray and ondansetron  Follow up 6 months.

## 2022-12-07 ENCOUNTER — Ambulatory Visit: Payer: Medicaid Other | Admitting: Sports Medicine

## 2022-12-07 ENCOUNTER — Ambulatory Visit (INDEPENDENT_AMBULATORY_CARE_PROVIDER_SITE_OTHER): Payer: Medicaid Other

## 2022-12-07 VITALS — BP 120/80 | Ht 63.0 in | Wt 267.0 lb

## 2022-12-07 DIAGNOSIS — M5412 Radiculopathy, cervical region: Secondary | ICD-10-CM | POA: Diagnosis not present

## 2022-12-07 DIAGNOSIS — M542 Cervicalgia: Secondary | ICD-10-CM | POA: Diagnosis not present

## 2022-12-07 DIAGNOSIS — M9908 Segmental and somatic dysfunction of rib cage: Secondary | ICD-10-CM

## 2022-12-07 DIAGNOSIS — M9901 Segmental and somatic dysfunction of cervical region: Secondary | ICD-10-CM | POA: Diagnosis not present

## 2022-12-07 DIAGNOSIS — M9902 Segmental and somatic dysfunction of thoracic region: Secondary | ICD-10-CM | POA: Diagnosis not present

## 2022-12-07 MED ORDER — MELOXICAM 15 MG PO TABS: 15.0000 mg | ORAL_TABLET | Freq: Every day | ORAL | 0 refills | Status: DC

## 2022-12-07 NOTE — Progress Notes (Signed)
Amanda [name] D.[facility] [address] [place] [address] Phone: [phone]   Assessment and Plan:     1. Neck pain 2. Cervical radiculopathy at C8 3. Somatic dysfunction of cervical region 4. Somatic dysfunction of thoracic region 5. Somatic dysfunction of rib region -Chronic with exacerbation, initial sports medicine visit - Years of neck pain with progressive numbness and tingling in a C8 pattern with mild C8 radiculopathy diagnosed on EMG -Currently unclear what is causing patient's chronic neck pain and C8 radiculopathy.  X-ray obtained in clinic, and my interpretation: No acute fracture or dislocation.  Relatively unremarkable imaging.  No explanation for C8 radiculopathy based on x-ray imaging - Patient may have stenosis or disc herniation causing ongoing symptoms.  We will attempt to treat with conservative therapy, however if patient has no improvement or worsening of symptoms, would consider C-spine MRI to further evaluate - Start meloxicam 15 mg daily x2 weeks.  If still having pain after 2 weeks, complete 3rd-week of meloxicam. May use remaining meloxicam as needed once daily for pain control.  Do not to use additional NSAIDs while taking meloxicam.  May use Tylenol [phone] mg 2 to 3 times a day for breakthrough pain. - Start HEP for neck and trapezius - Patient elected for initial OMT today.  Tolerated well per note below. - Decision today to treat with OMT was based on Physical Exam  After verbal consent patient was treated with HVLA (high velocity low amplitude), ME (muscle energy), FPR (flex positional release), ST (soft tissue), PC/PD (Pelvic Compression/ Pelvic Decompression) techniques in cervical, rib, thoracic, areas. Patient tolerated the procedure well with improvement in symptoms.  Patient educated on potential side effects of soreness and recommended to rest, hydrate, and use Tylenol as needed for pain control.   15  additional minutes spent for educating Therapeutic Home Exercise Program.  This included exercises focusing on stretching, strengthening, with focus on eccentric aspects.   Long term goals include an improvement in range of motion, strength, endurance as well as avoiding reinjury. Patient's frequency would include in 1-2 times a day, 3-5 times a week for a duration of 6-12 weeks. Proper technique shown and discussed handout in great detail with ATC.  All questions were discussed and answered.    Pertinent previous records reviewed include neurology note 12/06/2022, physical therapy note 09/19/2022  Follow Up: 4 weeks for reevaluation.  Could consider repeat OMT.  If no improvement or worsening of symptoms, would consider C-spine MRI   Subjective:   I, Amanda [name], am serving as a Neurosurgeon for Doctor Richardean Sale  Chief Complaint: neck pain   HPI:   12/07/22 Patient is a 42 year old female with concerns of neck pain. Patient states that she has had neck pain for years. Pain radiates to her head and gives her headaches. Does get numbness to her hand. Is in PT. Muscle relaxer helps a little. Decrease ROM.   Relevant Historical Information: GERD  Additional pertinent review of systems negative.   Current Outpatient Medications:    dicyclomine (BENTYL) 10 MG capsule, Take 1 capsule (10 mg total) by mouth every 6 (six) hours as needed for spasms., Disp: 30 capsule, Rfl: 3   esomeprazole (NEXIUM) 40 MG capsule, Take 1 capsule (40 mg total) by mouth 2 (two) times daily before a meal., Disp: 180 capsule, Rfl: 3   famotidine (PEPCID) 20 MG tablet, Take 20 mg by mouth 2 (two) times daily., Disp: , Rfl:  fluticasone (FLONASE) 50 MCG/ACT nasal spray, Place 2 sprays into both nostrils daily., Disp: 16 g, Rfl: 0   meloxicam (MOBIC) 15 MG tablet, Take 1 tablet (15 mg total) by mouth daily., Disp: 30 tablet, Rfl: 0   ondansetron (ZOFRAN ODT) 4 MG disintegrating tablet, Take 1 tablet (4 mg total) by  mouth every 8 (eight) hours as needed for nausea or vomiting., Disp: 20 tablet, Rfl: 5   SUMAtriptan ([name]) 20 MG/ACT nasal spray, 1 spray in nostril.  May repeat x1 in 2 hours if headache persists or recurs., Disp: 10 each, Rfl: 5   linaclotide (LINZESS) 145 MCG CAPS capsule, Take 1 capsule (145 mcg total) by mouth daily before breakfast., Disp: 90 capsule, Rfl: 3   Objective:     Vitals:   12/07/22 0948  BP: 120/80  Weight: 267 lb (121.1 kg)  Height: 5\' 3"  (1.6 m)      Body mass index is 47.3 kg/m.    Physical Exam:    Neck Exam: Cervical Spine- Posture normal Skin- normal, intact  Neuro:  Strength-  Right Left   Deltoid (C5) 5/5 5/5  Bicep/Brachioradialis (C5/6) 5/5  5/5  Wrist Extension (C6) 5/5 5/5  Tricep (C7) 5/5 5/5  Wrist Flexion (C7) 5/5 5/5  Grip (C8) 5/5 5/5  Finger Abduction (T1) 5/5 5/5   Sensation: intact to light touch in upper extremities bilaterally  Spurling's:  negative bilaterally Neck ROM: Full active ROM  TTP: cervical spinous processes, cervical paraspinal, thoracic paraspinal, trapezius   General: Well-appearing, cooperative, sitting comfortably in no acute distress.   OMT Physical Exam:   Cervical: TTP paraspinal, C3 RRSL Rib: Bilateral elevated first rib with TTP, worse on left Thoracic: TTP paraspinal, T6 RRSR    Electronically signed by:  Amanda [name] D.[facility] 10:54 AM 12/07/22

## 2022-12-07 NOTE — Patient Instructions (Signed)
4 week follow  - Start meloxicam 15 mg daily x2 weeks.  If still having pain after 2 weeks, complete 3rd-week of meloxicam. May use remaining meloxicam as needed once daily for pain control.  Do not to use additional NSAIDs while taking meloxicam.  May use Tylenol [phone] 2 to 3 times a day for breakthrough pain. Neck HEP  4 week follow up

## 2022-12-15 ENCOUNTER — Encounter: Payer: Self-pay | Admitting: *Deleted

## 2022-12-15 NOTE — Progress Notes (Signed)
Pt attended 11/19/2022 screening event where her b/p was 102/77. At the event, the pt confirmed her PCP is Amanda Riffle, PA at [facility] in [facility], and pt did not identify any SDOH insecurities. Pt also noted she has insurance. Chart review (via referral notes in media) verify pt was last seen by her PCP on 11/23/22. Although PCP office encounter notes are not visible in [facility] encounters, the referral notes included all the info from her most recent PCP visit. Additional chart review confirms pt has ongoing neurology and oncology specialty support. No additional health equity team support indicated at this time.

## 2022-12-20 ENCOUNTER — Inpatient Hospital Stay: Payer: Medicaid Other | Admitting: Hematology

## 2022-12-20 ENCOUNTER — Inpatient Hospital Stay: Payer: Medicaid Other | Attending: Hematology

## 2022-12-20 ENCOUNTER — Encounter: Payer: Self-pay | Admitting: Hematology

## 2022-12-20 VITALS — BP 122/75 | HR 78 | Temp 98.2°F | Resp 17 | Wt 274.3 lb

## 2022-12-20 DIAGNOSIS — K449 Diaphragmatic hernia without obstruction or gangrene: Secondary | ICD-10-CM

## 2022-12-20 DIAGNOSIS — J45909 Unspecified asthma, uncomplicated: Secondary | ICD-10-CM

## 2022-12-20 DIAGNOSIS — Z87440 Personal history of urinary (tract) infections: Secondary | ICD-10-CM | POA: Diagnosis not present

## 2022-12-20 DIAGNOSIS — D649 Anemia, unspecified: Secondary | ICD-10-CM | POA: Insufficient documentation

## 2022-12-20 DIAGNOSIS — N920 Excessive and frequent menstruation with regular cycle: Secondary | ICD-10-CM | POA: Diagnosis not present

## 2022-12-20 DIAGNOSIS — Z803 Family history of malignant neoplasm of breast: Secondary | ICD-10-CM

## 2022-12-20 LAB — CBC WITH DIFFERENTIAL/PLATELET
Abs Immature Granulocytes: 0.02 10*3/uL (ref 0.00–0.07)
Basophils Absolute: 0 10*3/uL (ref 0.0–0.1)
Basophils Relative: 1 %
Eosinophils Absolute: 0.2 10*3/uL (ref 0.0–0.5)
Eosinophils Relative: 3 %
HCT: 34.4 % — ABNORMAL LOW (ref 36.0–46.0)
Hemoglobin: 11.2 g/dL — ABNORMAL LOW (ref 12.0–15.0)
Immature Granulocytes: 0 %
Lymphocytes Relative: 25 %
Lymphs Abs: 1.6 10*3/uL (ref 0.7–4.0)
MCH: 24.9 pg — ABNORMAL LOW (ref 26.0–34.0)
MCHC: 32.6 g/dL (ref 30.0–36.0)
MCV: 76.6 fL — ABNORMAL LOW (ref 80.0–100.0)
Monocytes Absolute: 0.4 10*3/uL (ref 0.1–1.0)
Monocytes Relative: 7 %
Neutro Abs: 4.1 10*3/uL (ref 1.7–7.7)
Neutrophils Relative %: 64 %
Platelets: 217 10*3/uL (ref 150–400)
RBC: 4.49 MIL/uL (ref 3.87–5.11)
RDW: 16.4 % — ABNORMAL HIGH (ref 11.5–15.5)
WBC: 6.3 10*3/uL (ref 4.0–10.5)
nRBC: 0 % (ref 0.0–0.2)

## 2022-12-20 LAB — RETIC PANEL
Immature Retic Fract: 15.1 % (ref 2.3–15.9)
RBC.: 4.45 MIL/uL (ref 3.87–5.11)
Retic Count, Absolute: 39.2 10*3/uL (ref 19.0–186.0)
Retic Ct Pct: 0.9 % (ref 0.4–3.1)
Reticulocyte Hemoglobin: 28.9 pg (ref >27.9)

## 2022-12-20 LAB — VITAMIN B12: Vitamin B-12: 393 pg/mL (ref 180–914)

## 2022-12-20 LAB — FOLATE: Folate: 12.2 ng/mL (ref >5.9)

## 2022-12-20 MED ORDER — ACCRUFER 30 MG PO CAPS: 30.0000 mg | ORAL_CAPSULE | Freq: Every day | ORAL | 2 refills | Status: DC

## 2022-12-20 NOTE — Progress Notes (Signed)
[facility]   [phone] [phone]   Clinic New Consult Note   Patient Care Team: Norm Salt, [place] as PCP - General (Physician Assistant) Drema Dallas, DO as Consulting Physician (Neurology) Norm Salt, [place] (Physician Assistant) 12/20/2022  CHIEF COMPLAINTS/PURPOSE OF CONSULTATION:  Anemia  REFERRING PHYSICIAN: Norm Salt, PA   Discussed the use of AI scribe software for clinical note transcription with the patient, who gave verbal consent to proceed.  History of Present Illness   A 42 year old patient with a history of asthma, seasonal allergies, depression, hiatal hernia, and sickle cell trait presents for evaluation of anemia.  She was referred by her PCP, she presents to the clinic by herself.    The patient reports intermittent episodes of severe fatigue that limit her daily activities to the point of needing resting during the day. 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 The patient also experiences shortness of breath and severe leg pain, which she describes as a heaviness that makes it difficult to lift her legs. These symptoms started earlier this year and have been sporadic.  The patient has noticed an increase in the heaviness of her menstrual periods, which she describes as bleeding more than usual. She has a regular menstrual cycle, lasting four to five days, during which she changes pads seven to eight times a day. She also reports passing more blood clots. Concurrently, she has been experiencing increased urinary frequency, which has led to a referral to a urologist. She missed a recent ultrasound appointment due to not feeling well and frequent nocturnal urination. She has had recent emergency room visits for what she thought was low iron but was diagnosed as a urinary tract infection (UTI). Despite treatment, she continues to have symptoms and recent tests  have shown microscopic blood and pus in her urine.  The patient also reports chronic constipation, for which she takes Linzess. She has tried oral iron supplements in the past but stopped due to stomach cramps and worsening constipation. She expresses a willingness to try a new oral iron supplement with fewer gastrointestinal side effects.         MEDICAL HISTORY:  Past Medical History:  Diagnosis Date   Anginal pain (HCC)    r/t anxiety   Anxiety    on meds   Asthma    no inhaler - seasonal   Constipation    Depression    on meds   Dysrhythmia    Palpatations - r/t anxiety   Genital warts    GERD (gastroesophageal reflux disease)    on meds   H/O hiatal hernia    surgery to repair   Headache(784.0)    Heart murmur    History of low transverse cesarean section 02/12/2018   MRSA cellulitis    greater than 10 yrs ago dx at urgent care    Seasonal allergies    Sickle cell trait (HCC)     SURGICAL HISTORY: Past Surgical History:  Procedure Laterality Date   CERVICAL CONE BIOPSY     CESAREAN SECTION  01/20/2012   Procedure: CESAREAN SECTION;  Surgeon: [name], MD;  Location: [facility];  Service: Obstetrics;  Laterality: N/A;   CESAREAN SECTION WITH BILATERAL TUBAL LIGATION N/A 02/13/2018   Procedure: CESAREAN SECTION WITH BILATERAL TUBAL LIGATION;  Surgeon: Sherian Rein, MD;  Location: [facility];  Service: Obstetrics;  Laterality: N/AHerbert Seta, RNFA  Needs  [name]   COLONOSCOPY     DILATION AND EVACUATION N/A 03/30/2017   Procedure: DILATATION AND EVACUATION WITH ULTRASOUND GUIDANCE;  Surgeon: Sherian Rein, MD;  Location: [facility];  Service: Gynecology;  Laterality: N/A;   HERNIA REPAIR     INSERTION OF MESH N/A 05/15/2015   Procedure: INSERTION OF MESH;  Surgeon: Axel Filler, MD;  Location: [facility];  Service: General;  Laterality: N/A;   MARSUPIALIZATION URETHRAL DIVERTICULUM     UMBILICAL HERNIA REPAIR N/A 05/15/2015   Procedure:  LAPAROSCOPIC UMBILICAL HERNIA;  Surgeon: Axel Filler, MD;  Location: [facility];  Service: General;  Laterality: N/A;   UPPER GI ENDOSCOPY     WISDOM TOOTH EXTRACTION      SOCIAL HISTORY: Social History   Socioeconomic History   Marital status: Significant Other    Spouse name: Not on file   Number of children: 2   Years of education: 12+   Highest education level: Not on file  Occupational History   Not on file  Tobacco Use   Smoking status: Never   Smokeless tobacco: Never  Vaping Use   Vaping status: Never Used  Substance and Sexual Activity   Alcohol use: No    Alcohol/week: 0.0 standard drinks of alcohol   Drug use: No   Sexual activity: Yes    Partners: Male    Birth control/protection: Condom  Other Topics Concern   Not on file  Social History Narrative   Regular exercise-no   Caffeine Use-yes   Right handed   Two story house   Social Determinants of Health   Financial Resource Strain: Low Risk  (01/31/2018)   Overall Financial Resource Strain (CARDIA)    Difficulty of Paying Living Expenses: Not hard at all  Food Insecurity: Patient Declined (11/19/2022)   Hunger Vital Sign    Worried About Running Out of Food in the Last Year: Patient declined    Ran Out of Food in the Last Year: Patient declined  Transportation Needs: Patient Declined (11/19/2022)   PRAPARE - Administrator, Civil Service (Medical): Patient declined    Lack of Transportation (Non-Medical): Patient declined  Physical Activity: Not on file  Stress: Stress Concern Present (01/31/2018)   [employer] of Occupational Health - Occupational Stress Questionnaire    Feeling of Stress : Rather much  Social Connections: Not on file  Intimate Partner Violence: Patient Declined (11/19/2022)   Humiliation, Afraid, Rape, and Kick questionnaire    Fear of Current or Ex-Partner: Patient declined    Emotionally Abused: Patient declined    Physically Abused: Patient declined    Sexually  Abused: Patient declined    FAMILY HISTORY: Family History  Problem Relation Age of Onset   Hypertension Mother    Depression Mother    Depression Father    Breast cancer Maternal Aunt        Great Aunt   Cancer Maternal Aunt    Cancer Maternal Uncle        UNKNOWN TYPE CANCER   Heart disease Maternal Grandmother    Diabetes Maternal Grandfather    Heart disease Maternal Grandfather    Hypertension Maternal Grandfather    Congestive Heart Failure Paternal Grandfather    Anesthesia problems Neg Hx    Colon polyps Neg Hx    Colon cancer Neg Hx    Esophageal cancer Neg Hx    Stomach cancer Neg Hx    Rectal cancer Neg Hx     ALLERGIES:  is allergic  to aimovig [erenumab-aooe], doxycycline hyclate, and ketorolac tromethamine.  MEDICATIONS:  Current Outpatient Medications  Medication Sig Dispense Refill   Ferric Maltol (ACCRUFER) 30 MG CAPS Take 1 capsule (30 mg total) by mouth daily. 30 capsule 2   dicyclomine (BENTYL) 10 MG capsule Take 1 capsule (10 mg total) by mouth every 6 (six) hours as needed for spasms. 30 capsule 3   esomeprazole (NEXIUM) 40 MG capsule Take 1 capsule (40 mg total) by mouth 2 (two) times daily before a meal. 180 capsule 3   famotidine (PEPCID) 20 MG tablet Take 20 mg by mouth 2 (two) times daily.     fluticasone (FLONASE) 50 MCG/ACT nasal spray Place 2 sprays into both nostrils daily. 16 g 0   linaclotide (LINZESS) 145 MCG CAPS capsule Take 1 capsule (145 mcg total) by mouth daily before breakfast. 90 capsule 3   meloxicam (MOBIC) 15 MG tablet Take 1 tablet (15 mg total) by mouth daily. 30 tablet 0   ondansetron (ZOFRAN ODT) 4 MG disintegrating tablet Take 1 tablet (4 mg total) by mouth every 8 (eight) hours as needed for nausea or vomiting. 20 tablet 5   SUMAtriptan ([name]) 20 MG/ACT nasal spray 1 spray in nostril.  May repeat x1 in 2 hours if headache persists or recurs. 10 each 5   No current facility-administered medications for this visit.     REVIEW OF SYSTEMS:   Constitutional: Denies fevers, chills or abnormal night sweats, (+) fatigue Eyes: Denies blurriness of vision, double vision or watery eyes Ears, nose, mouth, throat, and face: Denies mucositis or sore throat Respiratory: Denies cough, (+) dyspnea on exertion, no wheezes Cardiovascular: Denies palpitation, chest discomfort or lower extremity swelling Gastrointestinal:  Denies nausea, heartburn or change in bowel habits Skin: Denies abnormal skin rashes Lymphatics: Denies new lymphadenopathy or easy bruising Neurological:Denies numbness, tingling or new weaknesses Behavioral/Psych: Mood is stable, no new changes  All other systems were reviewed with the patient and are negative.  PHYSICAL EXAMINATION: ECOG PERFORMANCE STATUS: 1 - Symptomatic but completely ambulatory  Vitals:   12/20/22 1530  BP: 122/75  Pulse: 78  Resp: 17  Temp: 98.2 F (36.8 C)  SpO2: 100%   Filed Weights   12/20/22 1530  Weight: 274 lb 4.8 oz (124.4 kg)    GENERAL:alert, no distress and comfortable, obese  SKIN: skin color, texture, turgor are normal, no rashes or significant lesions EYES: normal, conjunctiva are pink and non-injected, sclera clear OROPHARYNX:no exudate, no erythema and lips, buccal mucosa, and tongue normal  NECK: supple, thyroid normal size, non-tender, without nodularity LYMPH:  no palpable lymphadenopathy in the cervical, axillary or inguinal LUNGS: clear to auscultation and percussion with normal breathing effort HEART: regular rate & rhythm and no murmurs and no lower extremity edema ABDOMEN:abdomen soft, non-tender and normal bowel sounds Musculoskeletal:no cyanosis of digits and no clubbing  PSYCH: alert & oriented x 3 with fluent speech NEURO: no focal motor/sensory deficits   LABORATORY DATA:  I have reviewed the data as listed    Latest Ref Rng & Units 12/20/2022    4:07 PM 11/24/2022    9:17 PM 03/22/2022    5:29 AM  CBC  WBC 4.0 - 10.5 K/uL  6.3  9.3  10.9   Hemoglobin 12.0 - 15.0 g/dL 86.5  78.4  69.6   Hematocrit 36.0 - 46.0 % 34.4  35.6  39.2   Platelets 150 - 400 K/uL 217  225  230     @cmpl @  RADIOGRAPHIC STUDIES:  I have personally reviewed the radiological images as listed and agreed with the findings in the report. DG Cervical Spine 2 or 3 views  Result Date: 12/08/2022 CLINICAL DATA:  Chronic neck pain. EXAM: CERVICAL SPINE - 3 VIEW COMPARISON:  07/28/2004. FINDINGS: There is no evidence of cervical spine fracture or prevertebral soft tissue swelling. Alignment is normal. No other significant bone abnormalities are identified. IMPRESSION: Negative cervical spine radiographs. Electronically Signed   By: Layla Maw M.D.   On: 12/08/2022 22:56    ASSESSMENT & PLAN:  42 year old female     Normocytic anemia, likely iron deficient anemia from menorrhagia Intermittent anemia over the past ten years, likely related to heavy menstrual periods and iron deficiency. Hemoglobin levels have been around 10-11. Symptoms include fatigue, dyspnea, and leg pain. Oral iron supplements have caused gastrointestinal side effects including stomach pain and constipation. Discussed options for iron supplementation, including a new oral iron medication with fewer gastrointestinal side effects and IV iron infusion if oral iron is not tolerated or ineffective. Explained that IV iron is effective and improves symptoms like fatigue and leg cramps. Discussed the importance of checking iron levels and blood counts to monitor effectiveness. - Order lab work to check iron levels and blood counts - Prescribe new oral iron medication ferric maltol with fewer gastrointestinal side effects - Consider IV iron infusion if oral iron is not tolerated or ineffective - Discuss with gynecologist about options to manage menorrhagia, including potential use of oral contraceptives  Urinary Tract Infection (UTI) Recurrent UTI with symptoms of urinary frequency,  microscopic hematuria, and pyuria. Recent emergency room visit confirmed UTI and treatment with IV antibiotics. - Follow up with urologist for further evaluation and management  Asthma Seasonal allergy-induced asthma, managed with as-needed treatment. - Continue current asthma management as needed  Hiatal Hernia Hiatal hernia, managed with Nexium. - Continue Nexium for management  General Health Maintenance Routine health maintenance discussed including mammogram and weight management. Family history of breast cancer and has been getting regular breast checks. Discussed weight management options, including potential referral to a weight management clinic or consideration of weight loss medication. - Ensure mammogram results are available and reviewed - Discuss weight management options with primary care physician, including potential referral to a weight management clinic or consideration of weight loss medication  Plan -Labs today including CBC, reticular panel, ferritin, iron and TIBC, B12 and folate -I called in ferric maltol 30 mg daily for her, she will try if her co-pay is not high -Pete labs in 3 months, if she does not respond to oral iron well, will consider IV iron.        Orders Placed This Encounter  Procedures   CBC with Differential/Platelet    Standing Status:   Standing    Number of Occurrences:   50    Standing Expiration Date:   12/20/2023   Ferritin    Standing Status:   Standing    Number of Occurrences:   20    Standing Expiration Date:   12/20/2023   Vitamin B12    Standing Status:   Future    Number of Occurrences:   1    Standing Expiration Date:   12/20/2023   Retic Panel    Standing Status:   Future    Number of Occurrences:   1    Standing Expiration Date:   12/20/2023   Folate, Serum    Standing Status:   Future    Number of Occurrences:  1    Standing Expiration Date:   12/20/2023    All questions were answered. The patient knows to call  the clinic with any problems, questions or concerns. I spent 30 minutes counseling the patient face to face. The total time spent in the appointment was 35 minutes and more than 50% was on counseling.     Malachy Mood, MD 12/20/2022 4:36 PM

## 2022-12-21 LAB — FERRITIN: Ferritin: 13 ng/mL (ref 11–307)

## 2022-12-25 ENCOUNTER — Encounter: Payer: Self-pay | Admitting: Hematology

## 2022-12-29 ENCOUNTER — Other Ambulatory Visit: Payer: Self-pay | Admitting: Physician Assistant

## 2022-12-29 DIAGNOSIS — Z1231 Encounter for screening mammogram for malignant neoplasm of breast: Secondary | ICD-10-CM

## 2023-01-03 NOTE — Progress Notes (Unsigned)
    Aleen [facility]ells [facility] [address] [place] [address] Phone: [phone]   Assessment and Plan:     There are no diagnoses linked to this encounter.  ***   Pertinent previous records reviewed include ***    Follow Up: ***     Subjective:   I, Leopoldo Mazzie, am serving as a Neurosurgeon for Doctor Richardean Sale   Chief Complaint: neck pain    HPI:    12/07/22 Patient is a 42 year old female with concerns of neck pain. Patient states that she has had neck pain for years. Pain radiates to her head and gives her headaches. Does get numbness to her hand. Is in PT. Muscle relaxer helps a little. Decrease ROM.   01/04/2023 Patient states  Relevant Historical Information: GERD  Additional pertinent review of systems negative.   Current Outpatient Medications:    dicyclomine (BENTYL) 10 MG capsule, Take 1 capsule (10 mg total) by mouth every 6 (six) hours as needed for spasms., Disp: 30 capsule, Rfl: 3   esomeprazole (NEXIUM) 40 MG capsule, Take 1 capsule (40 mg total) by mouth 2 (two) times daily before a meal., Disp: 180 capsule, Rfl: 3   famotidine (PEPCID) 20 MG tablet, Take 20 mg by mouth 2 (two) times daily., Disp: , Rfl:    Ferric Maltol (ACCRUFER) 30 MG CAPS, Take 1 capsule (30 mg total) by mouth daily., Disp: 30 capsule, Rfl: 2   fluticasone (FLONASE) 50 MCG/ACT nasal spray, Place 2 sprays into both nostrils daily., Disp: 16 g, Rfl: 0   linaclotide (LINZESS) 145 MCG CAPS capsule, Take 1 capsule (145 mcg total) by mouth daily before breakfast., Disp: 90 capsule, Rfl: 3   meloxicam (MOBIC) 15 MG tablet, Take 1 tablet (15 mg total) by mouth daily., Disp: 30 tablet, Rfl: 0   ondansetron (ZOFRAN ODT) 4 MG disintegrating tablet, Take 1 tablet (4 mg total) by mouth every 8 (eight) hours as needed for nausea or vomiting., Disp: 20 tablet, Rfl: 5   SUMAtriptan ([name]) 20 MG/ACT nasal spray, 1 spray in nostril.  May repeat x1 in 2  hours if headache persists or recurs., Disp: 10 each, Rfl: 5   Objective:     There were no vitals filed for this visit.    There is no height or weight on file to calculate BMI.    Physical Exam:    ***   Electronically signed by:  Aleen Sells [facility] 11:36 AM 01/03/23

## 2023-01-04 ENCOUNTER — Ambulatory Visit: Payer: Medicaid Other | Admitting: Sports Medicine

## 2023-01-04 VITALS — Ht 63.0 in | Wt 273.0 lb

## 2023-01-04 DIAGNOSIS — M9901 Segmental and somatic dysfunction of cervical region: Secondary | ICD-10-CM | POA: Diagnosis not present

## 2023-01-04 DIAGNOSIS — M9902 Segmental and somatic dysfunction of thoracic region: Secondary | ICD-10-CM | POA: Diagnosis not present

## 2023-01-04 DIAGNOSIS — M542 Cervicalgia: Secondary | ICD-10-CM

## 2023-01-04 DIAGNOSIS — M9908 Segmental and somatic dysfunction of rib cage: Secondary | ICD-10-CM

## 2023-01-08 ENCOUNTER — Other Ambulatory Visit: Payer: Self-pay | Admitting: Sports Medicine

## 2023-01-31 NOTE — Progress Notes (Signed)
 [name] [address] [place] [address] Phone: [phone]   Assessment and Plan:     1. Neck pain 2. Somatic dysfunction of cervical region 3. Somatic dysfunction of thoracic region 4. Somatic dysfunction of rib region  -Chronic with exacerbation, subsequent visit - Overall improvement in flare of neck pain with OMT, HEP.  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  Elects for repeat OMT today.  Tolerated well per note below. - Decision today to treat with OMT was based on Physical Exam  After verbal consent patient was treated with HVLA (high velocity low amplitude), ME (muscle energy), FPR (flex positional release), ST (soft tissue),   techniques in cervical, rib, thoracic,  areas. Patient tolerated the procedure well with improvement in symptoms.  Patient educated on potential side effects of soreness and recommended to rest, hydrate, and use Tylenol  as needed for pain control.   Pertinent previous records reviewed include none  Follow Up: 4 weeks for reevaluation.  Could consider repeat OMT   Subjective:   I, Moenique Parris, am serving as a neurosurgeon for Doctor Morene Mace   Chief Complaint: neck pain    HPI:    12/07/22 Patient is a 43 year old female with concerns of neck pain. Patient states that she has had neck pain for years. Pain radiates to her head and gives her headaches. Does get numbness to her hand. Is in PT. Muscle relaxer helps a little. Decrease ROM.    01/04/2023 Patient states that she is okay . OMT really helped . Meloxicam  didn't really do much   01/31/2022 Patient states that she is doing fine    Relevant Historical Information: GERD  Additional pertinent review of  systems negative.   Current Outpatient Medications:    dicyclomine  (BENTYL ) 10 MG capsule, Take 1 capsule (10 mg total) by mouth every 6 (six) hours as needed for spasms., Disp: 30 capsule, Rfl: 3   esomeprazole  (NEXIUM ) 40 MG capsule, Take 1 capsule (40 mg total) by mouth 2 (two) times daily before a meal., Disp: 180 capsule, Rfl: 3   famotidine  (PEPCID ) 20 MG tablet, Take 20 mg by mouth 2 (two) times daily., Disp: , Rfl:    Ferric Maltol  (ACCRUFER ) 30 MG CAPS, Take 1 capsule (30 mg total) by mouth daily., Disp: 30 capsule, Rfl: 2   fluticasone  (FLONASE ) 50 MCG/ACT nasal spray, Place 2 sprays into both nostrils daily., Disp: 16 g, Rfl: 0   meloxicam  (MOBIC ) 15 MG tablet, Take 1 tablet (15 mg total) by mouth daily., Disp: 30 tablet, Rfl: 0   ondansetron  (ZOFRAN  ODT) 4 MG disintegrating tablet, Take 1 tablet (4 mg total) by mouth every 8 (eight) hours as needed for nausea or vomiting., Disp: 20 tablet, Rfl: 5   SUMAtriptan  ([name] ) 20 MG/ACT nasal spray, 1 spray in nostril.  May repeat x1 in 2 hours if headache persists or recurs., Disp: 10 each, Rfl: 5   linaclotide  (LINZESS ) 145 MCG CAPS capsule, Take 1 capsule (145 mcg total) by mouth daily before breakfast., Disp: 90 capsule, Rfl: 3   Objective:     Vitals:   02/01/23 0747  BP: 110/74  Weight: 271 lb (122.9 kg)  Height: 5' 3 (1.6 m)  Body mass index is 48.01 kg/m.    Physical Exam:    General: Well-appearing, cooperative, sitting comfortably in no acute distress.   OMT Physical Exam:    Cervical: TTP paraspinal, C3 RRSL, C5 RLSR Rib: Bilateral elevated first rib with TTP Thoracic: TTP paraspinal, T6 RRSR   Electronically signed by:  Odis [facility]ace [name] 8:27 AM 02/01/23

## 2023-02-01 ENCOUNTER — Ambulatory Visit: Payer: Medicaid Other | Admitting: Sports Medicine

## 2023-02-01 VITALS — BP 110/74 | Ht 63.0 in | Wt 271.0 lb

## 2023-02-01 DIAGNOSIS — M9901 Segmental and somatic dysfunction of cervical region: Secondary | ICD-10-CM

## 2023-02-01 DIAGNOSIS — M9902 Segmental and somatic dysfunction of thoracic region: Secondary | ICD-10-CM | POA: Diagnosis not present

## 2023-02-01 DIAGNOSIS — M542 Cervicalgia: Secondary | ICD-10-CM | POA: Diagnosis not present

## 2023-02-01 DIAGNOSIS — M9908 Segmental and somatic dysfunction of rib cage: Secondary | ICD-10-CM | POA: Diagnosis not present

## 2023-02-08 ENCOUNTER — Ambulatory Visit
Admission: RE | Admit: 2023-02-08 | Discharge: 2023-02-08 | Disposition: A | Discharge status: Home / Self Care | Payer: Medicaid Other | Source: Ambulatory Visit | Attending: Physician Assistant | Admitting: Physician Assistant

## 2023-02-08 DIAGNOSIS — Z1231 Encounter for screening mammogram for malignant neoplasm of breast: Secondary | ICD-10-CM

## 2023-02-20 ENCOUNTER — Inpatient Hospital Stay: Payer: Medicaid Other | Attending: Hematology

## 2023-02-20 DIAGNOSIS — N920 Excessive and frequent menstruation with regular cycle: Secondary | ICD-10-CM | POA: Insufficient documentation

## 2023-02-20 DIAGNOSIS — K5900 Constipation, unspecified: Secondary | ICD-10-CM | POA: Insufficient documentation

## 2023-02-20 DIAGNOSIS — Z79899 Other long term (current) drug therapy: Secondary | ICD-10-CM | POA: Insufficient documentation

## 2023-02-20 DIAGNOSIS — N390 Urinary tract infection, site not specified: Secondary | ICD-10-CM | POA: Insufficient documentation

## 2023-02-20 DIAGNOSIS — D573 Sickle-cell trait: Secondary | ICD-10-CM | POA: Insufficient documentation

## 2023-02-20 DIAGNOSIS — J45909 Unspecified asthma, uncomplicated: Secondary | ICD-10-CM | POA: Insufficient documentation

## 2023-02-20 DIAGNOSIS — Z8614 Personal history of Methicillin resistant Staphylococcus aureus infection: Secondary | ICD-10-CM | POA: Insufficient documentation

## 2023-02-20 DIAGNOSIS — Z87440 Personal history of urinary (tract) infections: Secondary | ICD-10-CM | POA: Insufficient documentation

## 2023-02-20 DIAGNOSIS — K219 Gastro-esophageal reflux disease without esophagitis: Secondary | ICD-10-CM | POA: Insufficient documentation

## 2023-02-20 DIAGNOSIS — F419 Anxiety disorder, unspecified: Secondary | ICD-10-CM | POA: Insufficient documentation

## 2023-02-20 DIAGNOSIS — Z98891 History of uterine scar from previous surgery: Secondary | ICD-10-CM | POA: Insufficient documentation

## 2023-02-20 DIAGNOSIS — D509 Iron deficiency anemia, unspecified: Secondary | ICD-10-CM | POA: Insufficient documentation

## 2023-02-20 DIAGNOSIS — R5383 Other fatigue: Secondary | ICD-10-CM | POA: Insufficient documentation

## 2023-02-22 ENCOUNTER — Encounter: Payer: Self-pay | Admitting: Hematology

## 2023-02-22 ENCOUNTER — Inpatient Hospital Stay: Payer: Medicaid Other | Admitting: Hematology

## 2023-02-22 ENCOUNTER — Inpatient Hospital Stay: Payer: Medicaid Other

## 2023-02-22 VITALS — BP 135/94 | HR 81 | Temp 98.1°F | Resp 17 | Wt 277.2 lb

## 2023-02-22 DIAGNOSIS — Z79899 Other long term (current) drug therapy: Secondary | ICD-10-CM | POA: Diagnosis not present

## 2023-02-22 DIAGNOSIS — J45909 Unspecified asthma, uncomplicated: Secondary | ICD-10-CM | POA: Diagnosis not present

## 2023-02-22 DIAGNOSIS — F419 Anxiety disorder, unspecified: Secondary | ICD-10-CM | POA: Diagnosis not present

## 2023-02-22 DIAGNOSIS — N920 Excessive and frequent menstruation with regular cycle: Secondary | ICD-10-CM | POA: Diagnosis not present

## 2023-02-22 DIAGNOSIS — R5383 Other fatigue: Secondary | ICD-10-CM | POA: Diagnosis not present

## 2023-02-22 DIAGNOSIS — D573 Sickle-cell trait: Secondary | ICD-10-CM | POA: Diagnosis not present

## 2023-02-22 DIAGNOSIS — D509 Iron deficiency anemia, unspecified: Secondary | ICD-10-CM | POA: Diagnosis present

## 2023-02-22 DIAGNOSIS — Z87440 Personal history of urinary (tract) infections: Secondary | ICD-10-CM | POA: Diagnosis not present

## 2023-02-22 DIAGNOSIS — D500 Iron deficiency anemia secondary to blood loss (chronic): Secondary | ICD-10-CM

## 2023-02-22 DIAGNOSIS — D649 Anemia, unspecified: Secondary | ICD-10-CM

## 2023-02-22 DIAGNOSIS — Z8614 Personal history of Methicillin resistant Staphylococcus aureus infection: Secondary | ICD-10-CM | POA: Diagnosis not present

## 2023-02-22 DIAGNOSIS — N390 Urinary tract infection, site not specified: Secondary | ICD-10-CM | POA: Diagnosis not present

## 2023-02-22 DIAGNOSIS — K5900 Constipation, unspecified: Secondary | ICD-10-CM | POA: Diagnosis not present

## 2023-02-22 DIAGNOSIS — K219 Gastro-esophageal reflux disease without esophagitis: Secondary | ICD-10-CM | POA: Diagnosis not present

## 2023-02-22 DIAGNOSIS — Z98891 History of uterine scar from previous surgery: Secondary | ICD-10-CM | POA: Diagnosis not present

## 2023-02-22 LAB — FERRITIN: Ferritin: 11 ng/mL (ref 11–307)

## 2023-02-22 LAB — CBC WITH DIFFERENTIAL/PLATELET
Abs Immature Granulocytes: 0.08 10*3/uL — ABNORMAL HIGH (ref 0.00–0.07)
Basophils Absolute: 0 10*3/uL (ref 0.0–0.1)
Basophils Relative: 0 %
Eosinophils Absolute: 0.2 10*3/uL (ref 0.0–0.5)
Eosinophils Relative: 3 %
HCT: 36.9 % (ref 36.0–46.0)
Hemoglobin: 11.7 g/dL — ABNORMAL LOW (ref 12.0–15.0)
Immature Granulocytes: 1 %
Lymphocytes Relative: 22 %
Lymphs Abs: 1.3 10*3/uL (ref 0.7–4.0)
MCH: 23.7 pg — ABNORMAL LOW (ref 26.0–34.0)
MCHC: 31.7 g/dL (ref 30.0–36.0)
MCV: 74.8 fL — ABNORMAL LOW (ref 80.0–100.0)
Monocytes Absolute: 0.4 10*3/uL (ref 0.1–1.0)
Monocytes Relative: 6 %
Neutro Abs: 4.1 10*3/uL (ref 1.7–7.7)
Neutrophils Relative %: 68 %
Platelets: 229 10*3/uL (ref 150–400)
RBC: 4.93 MIL/uL (ref 3.87–5.11)
RDW: 16.4 % — ABNORMAL HIGH (ref 11.5–15.5)
WBC: 6 10*3/uL (ref 4.0–10.5)
nRBC: 0 % (ref 0.0–0.2)

## 2023-02-22 NOTE — Progress Notes (Signed)
[facility]   [phone] [phone]   Clinic Follow up Note   Patient Care Team: Norm Salt, PA as PCP - General (Physician Assistant) Drema Dallas, DO as Consulting Physician (Neurology) Norm Salt, [place] (Physician Assistant)  Date of Service:  02/22/2023  CHIEF COMPLAINT: f/u of anemia  CURRENT THERAPY:  Oral iron   Assessment and Plan    Iron Deficiency Anemia Follow-up for iron deficiency anemia. Stopped prescribed iron pills after two weeks due to gastrointestinal side effects. Currently using a natural iron supplement (iron fish cooking tool) but efficacy is uncertain. Reports intermittent fatigue, potentially multifactorial, including recent UTI and double ear infection. Heavy menstrual periods continue to contribute to anemia. Discussed alternative iron supplementation options including prenatal vitamins and liquid iron, which may be better tolerated. IV iron was discussed but patient prefers oral options due to anxiety about IVs. Explained that IV iron is more efficient and can be administered in small or large doses, but carries a 10-20% risk of allergic reaction. 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 Discussed the importance of managing anemia to maintain energy levels and overall health. - Encourage balanced diet rich in iron - Ensure adequate rest and self-care.     PLAN -I reviewed her lab after her clinic visit, which showed hemoglobin 11.7, ferritin 11 -She is willing to try other oral iron such as a prenatal vitamin, or liquid iron.  She is also can try "fish iron cooking aid"  -Lab every 3 months and follow-up in 6 months      Discussed the use of  AI scribe software for clinical note transcription with the patient, who gave verbal consent to proceed.  History of Present Illness   A 43 year old patient with a history of anemia presents for a follow-up visit. The patient reports that the prescribed iron pills caused stomach pain and constipation, leading to discontinuation after two weeks of use. The patient is considering trying a natural iron supplement, specifically an iron fish cooking tool. The patient also reports having a UTI infection and a double ear infection, which may be contributing to feelings of fatigue. The patient also mentions having heavy periods and has scheduled a GYN appointment to discuss this issue. 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         All other systems were reviewed with the patient and are negative.  MEDICAL HISTORY:  Past Medical History:  Diagnosis Date   Anginal pain (HCC)    r/t anxiety   Anxiety    on meds   Asthma    no inhaler - seasonal   Constipation    Depression    on meds   Dysrhythmia    Palpatations - r/t anxiety   Genital warts    GERD (gastroesophageal reflux disease)    on meds   H/O hiatal hernia    surgery to repair   Headache(784.0)    Heart murmur    History of low transverse cesarean section 02/12/2018   MRSA cellulitis    greater than 10 yrs ago dx at urgent care    Seasonal allergies    Sickle  cell trait (HCC)     SURGICAL HISTORY: Past Surgical History:  Procedure Laterality Date   CERVICAL CONE BIOPSY     CESAREAN SECTION  01/20/2012   Procedure: CESAREAN SECTION;  Surgeon: [name], MD;  Location: [facility];  Service: Obstetrics;  Laterality: N/A;   CESAREAN SECTION WITH BILATERAL TUBAL LIGATION N/A 02/13/2018   Procedure: CESAREAN SECTION WITH BILATERAL TUBAL LIGATION;  Surgeon: Sherian Rein, MD;  Location: [facility];  Service: Obstetrics;   Laterality: N/A;  Herbert Seta, RNFA  Needs [name]   COLONOSCOPY     DILATION AND EVACUATION N/A 03/30/2017   Procedure: DILATATION AND EVACUATION WITH ULTRASOUND GUIDANCE;  Surgeon: Sherian Rein, MD;  Location: [facility];  Service: Gynecology;  Laterality: N/A;   HERNIA REPAIR     INSERTION OF MESH N/A 05/15/2015   Procedure: INSERTION OF MESH;  Surgeon: Axel Filler, MD;  Location: [facility];  Service: General;  Laterality: N/A;   MARSUPIALIZATION URETHRAL DIVERTICULUM     UMBILICAL HERNIA REPAIR N/A 05/15/2015   Procedure: LAPAROSCOPIC UMBILICAL HERNIA;  Surgeon: Axel Filler, MD;  Location: [facility];  Service: General;  Laterality: N/A;   UPPER GI ENDOSCOPY     WISDOM TOOTH EXTRACTION      I have reviewed the social history and family history with the patient and they are unchanged from previous note.  ALLERGIES:  is allergic to aimovig [erenumab-aooe], doxycycline hyclate, and ketorolac tromethamine.  MEDICATIONS:  Current Outpatient Medications  Medication Sig Dispense Refill   dicyclomine (BENTYL) 10 MG capsule Take 1 capsule (10 mg total) by mouth every 6 (six) hours as needed for spasms. 30 capsule 3   esomeprazole (NEXIUM) 40 MG capsule Take 1 capsule (40 mg total) by mouth 2 (two) times daily before a meal. 180 capsule 3   famotidine (PEPCID) 20 MG tablet Take 20 mg by mouth 2 (two) times daily.     Ferric Maltol (ACCRUFER) 30 MG CAPS Take 1 capsule (30 mg total) by mouth daily. 30 capsule 2   fluticasone (FLONASE) 50 MCG/ACT nasal spray Place 2 sprays into both nostrils daily. 16 g 0   linaclotide (LINZESS) 145 MCG CAPS capsule Take 1 capsule (145 mcg total) by mouth daily before breakfast. 90 capsule 3   meloxicam (MOBIC) 15 MG tablet Take 1 tablet (15 mg total) by mouth daily. 30 tablet 0   ondansetron (ZOFRAN ODT) 4 MG disintegrating tablet Take 1 tablet (4 mg total) by mouth every 8 (eight) hours as needed for nausea or vomiting. 20 tablet 5   SUMAtriptan ([name]) 20  MG/ACT nasal spray 1 spray in nostril.  May repeat x1 in 2 hours if headache persists or recurs. 10 each 5   No current facility-administered medications for this visit.    PHYSICAL EXAMINATION: ECOG PERFORMANCE STATUS: 1 - Symptomatic but completely ambulatory  Vitals:   02/22/23 0939  BP: (!) 135/94  Pulse: 81  Resp: 17  Temp: 98.1 F (36.7 C)  SpO2: 100%   Wt Readings from Last 3 Encounters:  02/22/23 277 lb 3.2 oz (125.7 kg)  02/01/23 271 lb (122.9 kg)  01/04/23 273 lb (123.8 kg)     GENERAL:alert, no distress and comfortable SKIN: skin color, texture, turgor are normal, no rashes or significant lesions EYES: normal, Conjunctiva are pink and non-injected, sclera clear NECK: supple, thyroid normal size, non-tender, without nodularity LYMPH:  no palpable lymphadenopathy in the cervical, axillary  LUNGS: clear to auscultation and percussion with normal breathing effort HEART: regular rate &  rhythm and no murmurs and no lower extremity edema ABDOMEN:abdomen soft, non-tender and normal bowel sounds Musculoskeletal:no cyanosis of digits and no clubbing  NEURO: alert & oriented x 3 with fluent speech, no focal motor/sensory deficits   LABORATORY DATA:  I have reviewed the data as listed    Latest Ref Rng & Units 02/22/2023   10:00 AM 12/20/2022    4:07 PM 11/24/2022    9:17 PM  CBC  WBC 4.0 - 10.5 K/uL 6.0  6.3  9.3   Hemoglobin 12.0 - 15.0 g/dL 78.2  95.6  21.3   Hematocrit 36.0 - 46.0 % 36.9  34.4  35.6   Platelets 150 - 400 K/uL 229  217  225         Latest Ref Rng & Units 11/24/2022    9:17 PM 03/22/2022    5:29 AM 10/09/2020    1:31 PM  CMP  Glucose 70 - 99 mg/dL 90  086  84   BUN 6 - 20 mg/dL 10  12  7    Creatinine 0.44 - 1.00 mg/dL 5.78  4.69  6.29   Sodium 135 - 145 mmol/L 137  138  140   Potassium 3.5 - 5.1 mmol/L 3.8  3.8  4.1   Chloride 98 - 111 mmol/L 102  103  104   CO2 22 - 32 mmol/L 25  22  26    Calcium 8.9 - 10.3 mg/dL 9.2  9.1  9.0   Total  Protein 6.5 - 8.1 g/dL  7.2  6.7   Total Bilirubin 0.3 - 1.2 mg/dL  0.6  0.7   Alkaline Phos 38 - 126 U/L  66  54   AST 15 - 41 U/L  26  16   ALT 0 - 44 U/L  25  13       RADIOGRAPHIC STUDIES: I have personally reviewed the radiological images as listed and agreed with the findings in the report. No results found.    No orders of the defined types were placed in this encounter.  All questions were answered. The patient knows to call the clinic with any problems, questions or concerns. No barriers to learning was detected. The total time spent in the appointment was 15 minutes.     Malachy Mood, MD 02/22/2023

## 2023-02-28 NOTE — Progress Notes (Signed)
 Ben Raveena Hebdon D.[facility] [address] [place] [address] Phone: [phone]   Assessment and Plan:     1. Neck pain 2. Cervical radiculopathy at C8 3. Somatic dysfunction of cervical region 4. Somatic dysfunction of thoracic region 5. Somatic dysfunction of rib region - Chronic with exacerbation, subsequent visit - Significant flare and neck pain with left-sided radicular symptoms in a C8 pattern.  C8 radiculopathy diagnosed on previous EMG - With patient's return of symptoms and current flare, failure to improve with >6 weeks of conservative therapy, pain >6/10, pain affecting day-to-day activities, recommend further evaluation with C-spine MRI. - Start meloxicam  15 mg daily x2 weeks.  If still having pain after 2 weeks, complete 3rd-week of NSAID. May use remaining NSAID as needed once daily for pain control.  Do not to use additional over-the-counter NSAIDs (ibuprofen , naproxen , Advil , Aleve ) while taking prescription NSAIDs.  May use Tylenol  [phone] mg 2 to 3 times a day for breakthrough pain.  - Continue HEP - Patient has received relief with OMT in the past.  Elects for repeat OMT today.  Tolerated well per note below. - Decision today to treat with OMT was based on Physical Exam  After verbal consent patient was treated with HVLA (high velocity low amplitude), ME (muscle energy), FPR (flex positional release), ST (soft tissue),   techniques in cervical, rib, thoracic,  areas. Patient tolerated the procedure well with improvement in symptoms.  Patient educated on potential side effects of soreness and recommended to rest, hydrate, and use Tylenol  as needed for pain control.    Pertinent previous records reviewed include none  Follow Up: 5 days after MRI to review results and discuss treatment plan.  Could order epidural if appropriate based on MRI results.  Could discuss repeat OMT   Subjective:   I, Moenique Parris, am serving as a  neurosurgeon for Doctor Morene Mace   Chief Complaint: neck pain    HPI:    12/07/22 Patient is a 43 year old female with concerns of neck pain. Patient states that she has had neck pain for years. Pain radiates to her head and gives her headaches. Does get numbness to her hand. Is in PT. Muscle relaxer helps a little. Decrease ROM.    01/04/2023 Patient states that she is okay . OMT really helped . Meloxicam  didn't really do much    01/31/2022 Patient states that she is doing fine   03/01/2023 Patient states arm is hurting really bad right now and have a headache that has been going on for about two weeks.    Relevant Historical Information: GERD  Additional pertinent review of systems negative.   Current Outpatient Medications:    dicyclomine  (BENTYL ) 10 MG capsule, Take 1 capsule (10 mg total) by mouth every 6 (six) hours as needed for spasms., Disp: 30 capsule, Rfl: 3   esomeprazole  (NEXIUM ) 40 MG capsule, Take 1 capsule (40 mg total) by mouth 2 (two) times daily before a meal., Disp: 180 capsule, Rfl: 3   famotidine  (PEPCID ) 20 MG tablet, Take 20 mg by mouth 2 (two) times daily., Disp: , Rfl:    fluticasone  (FLONASE ) 50 MCG/ACT nasal spray, Place 2 sprays into both nostrils daily., Disp: 16 g, Rfl: 0   meloxicam  (MOBIC ) 15 MG tablet, Take 1 tablet (15 mg total) by mouth daily., Disp: 30 tablet, Rfl: 0   ondansetron  (ZOFRAN  ODT) 4 MG disintegrating tablet, Take 1 tablet (4 mg total) by mouth every  8 (eight) hours as needed for nausea or vomiting., Disp: 20 tablet, Rfl: 5   SUMAtriptan  ([name] ) 20 MG/ACT nasal spray, 1 spray in nostril.  May repeat x1 in 2 hours if headache persists or recurs., Disp: 10 each, Rfl: 5   linaclotide  (LINZESS ) 145 MCG CAPS capsule, Take 1 capsule (145 mcg total) by mouth daily before breakfast., Disp: 90 capsule, Rfl: 3   Objective:     Vitals:   03/01/23 0808  BP: 118/70  Pulse: 76  SpO2: 97%  Height: 5' 3 (1.6 m)      Body mass index is 49.1  kg/m.    Physical Exam:    Neck Exam: Cervical Spine- Posture normal Skin- normal, intact   Neuro:  Strength-   Right Left  Deltoid (C5) 5/5 5/5 Bicep/Brachioradialis (C5/6) 5/5  5/5 Wrist Extension (C6) 5/5 5/5 Tricep (C7) 5/5 5/5 Wrist Flexion (C7) 5/5 5/5 Grip (C8) 5/5 5/5 Finger Abduction (T1) 5/5 5/5   Sensation: Decreased sensation over posterior left forearm compared to right.  Otherwise, intact to light touch in upper extremities bilaterally   Spurling's:  negative bilaterally Neck ROM: Full active ROM  TTP: cervical spinous processes, cervical paraspinal, thoracic paraspinal, trapezius    General: Well-appearing, cooperative, sitting comfortably in no acute distress.   OMT Physical Exam:   Cervical: TTP paraspinal, C3 RRSL Rib: Bilateral elevated first rib with TTP, worse on left Thoracic: TTP paraspinal, T6 RRSR    Electronically signed by:  Odis Mace D.[facility] 8:57 AM 03/01/23

## 2023-03-01 ENCOUNTER — Ambulatory Visit (INDEPENDENT_AMBULATORY_CARE_PROVIDER_SITE_OTHER): Payer: Medicaid Other | Admitting: Sports Medicine

## 2023-03-01 VITALS — BP 118/70 | HR 76 | Ht 63.0 in

## 2023-03-01 DIAGNOSIS — M542 Cervicalgia: Secondary | ICD-10-CM

## 2023-03-01 DIAGNOSIS — M9908 Segmental and somatic dysfunction of rib cage: Secondary | ICD-10-CM | POA: Diagnosis not present

## 2023-03-01 DIAGNOSIS — M9901 Segmental and somatic dysfunction of cervical region: Secondary | ICD-10-CM

## 2023-03-01 DIAGNOSIS — M9902 Segmental and somatic dysfunction of thoracic region: Secondary | ICD-10-CM | POA: Diagnosis not present

## 2023-03-01 DIAGNOSIS — M5412 Radiculopathy, cervical region: Secondary | ICD-10-CM

## 2023-03-01 NOTE — Patient Instructions (Signed)
-   Start meloxicam  15 mg daily x2 weeks.  If still having pain after 2 weeks, complete 3rd-week of NSAID. May use remaining NSAID as needed once daily for pain control.  Do not to use additional over-the-counter NSAIDs (ibuprofen , naproxen , Advil , Aleve ) while taking prescription NSAIDs.  May use Tylenol  [phone] mg 2 to 3 times a day for breakthrough pain. MRI cervical  Follow up 5 days after MRI to discuss results

## 2023-03-03 ENCOUNTER — Other Ambulatory Visit: Payer: Self-pay | Admitting: Sports Medicine

## 2023-03-18 ENCOUNTER — Other Ambulatory Visit: Payer: Medicaid Other

## 2023-04-07 ENCOUNTER — Ambulatory Visit
Admission: RE | Admit: 2023-04-07 | Discharge: 2023-04-07 | Disposition: A | Discharge status: Home / Self Care | Payer: Medicaid Other | Source: Ambulatory Visit | Attending: Sports Medicine | Admitting: Sports Medicine

## 2023-04-07 DIAGNOSIS — M9902 Segmental and somatic dysfunction of thoracic region: Secondary | ICD-10-CM

## 2023-04-07 DIAGNOSIS — M542 Cervicalgia: Secondary | ICD-10-CM

## 2023-04-07 DIAGNOSIS — M5412 Radiculopathy, cervical region: Secondary | ICD-10-CM

## 2023-04-07 DIAGNOSIS — M9908 Segmental and somatic dysfunction of rib cage: Secondary | ICD-10-CM

## 2023-04-07 DIAGNOSIS — M9901 Segmental and somatic dysfunction of cervical region: Secondary | ICD-10-CM

## 2023-04-09 ENCOUNTER — Other Ambulatory Visit: Payer: Self-pay | Admitting: Sports Medicine

## 2023-04-14 NOTE — Progress Notes (Signed)
 Aleen Sells D.[facility] [address] [place] [address] Phone: [phone]   Assessment and Plan:     1. Neck pain 2. Cervical radiculopathy at C8 3. Somatic dysfunction of cervical region 4. Somatic dysfunction of thoracic region 5. Somatic dysfunction of rib region  -Chronic with exacerbation, subsequent visit - Recurrence in neck and upper back tightness and pain with occasional left-sided radicular symptoms in the left arm, however these radicular symptoms are only occurring 1-3 times per month currently - Start meloxicam 15 mg daily x2 weeks.  If still having pain after 2 weeks, complete 3rd-week of NSAID. May use remaining NSAID as needed once daily for pain control.  Do not to use additional over-the-counter NSAIDs (ibuprofen, naproxen, Advil, Aleve, etc.) while taking prescription NSAIDs.  May use Tylenol [phone] mg 2 to 3 times a day for breakthrough pain.  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  After discussion, we decided to not proceed with epidural CSI at this time and we will continue conservative management - Patient has received relief with OMT in the past.  Elects for repeat OMT today.  Tolerated well per note below. - Decision today to treat with OMT was based on Physical Exam   After verbal consent patient was treated with HVLA (high velocity low amplitude), ME (muscle energy), FPR (flex positional release), ST (soft tissue),  techniques in cervical, rib, thoracic, lumbar, and pelvic areas. Patient tolerated the procedure well with improvement in symptoms.  Patient educated on potential side effects of soreness and recommended to rest, hydrate, and use Tylenol as needed for pain control.   Pertinent previous  records reviewed include C-spine MRI 04/07/2023  Follow Up: 4 weeks for reevaluation.  Could consider repeat OMT.  Could consider epidural CSI if radicular symptoms become more frequent or severe   Subjective:   I, Moenique Parris, am serving as a Neurosurgeon for Doctor Richardean Sale   Chief Complaint: neck pain    HPI:    12/07/22 Patient is a 43 year old female with concerns of neck pain. Patient states that she has had neck pain for years. Pain radiates to her head and gives her headaches. Does get numbness to her hand. Is in PT. Muscle relaxer helps a little. Decrease ROM.    01/04/2023 Patient states that she is okay . OMT really helped . Meloxicam didn't really do much    01/31/2022 Patient states that she is doing fine    03/01/2023 Patient states arm is hurting really bad right now and have a headache that has been going on for about two weeks.   04/17/2023 Patient states she is a little tight    Relevant Historical Information: GERD  Additional pertinent review of systems negative.  Current Outpatient Medications  Medication Sig Dispense Refill   dicyclomine (BENTYL) 10 MG capsule Take 1 capsule (10 mg total) by mouth every 6 (six) hours as needed for spasms. 30 capsule 3   esomeprazole (NEXIUM) 40 MG capsule Take 1 capsule (40 mg total) by mouth 2 (two) times daily before a meal. 180 capsule 3   famotidine (PEPCID) 20 MG tablet Take 20 mg by mouth 2 (two) times daily.     fluticasone (FLONASE) 50 MCG/ACT nasal spray Place 2 sprays into both nostrils daily.  16 g 0   meloxicam (MOBIC) 15 MG tablet Take 1 tablet (15 mg total) by mouth daily as needed for pain. 30 tablet 0   ondansetron (ZOFRAN ODT) 4 MG disintegrating tablet Take 1 tablet (4 mg total) by mouth every 8 (eight) hours as needed for nausea or vomiting. 20 tablet 5   SUMAtriptan ([name]) 20 MG/ACT nasal spray 1 spray in nostril.  May repeat x1 in 2 hours if headache persists or recurs. 10 each 5   linaclotide  (LINZESS) 145 MCG CAPS capsule Take 1 capsule (145 mcg total) by mouth daily before breakfast. 90 capsule 3   No current facility-administered medications for this visit.      Objective:     Vitals:   04/17/23 1331  BP: 130/78  Weight: 277 lb (125.6 kg)  Height: 5\' 3"  (1.6 m)      Body mass index is 49.07 kg/m.    Physical Exam:      Neck Exam: Cervical Spine- Posture normal Skin- normal, intact   Neuro:  Strength-   Right Left  Deltoid (C5) 5/5 5/5 Bicep/Brachioradialis (C5/6) 5/5  5/5 Wrist Extension (C6) 5/5 5/5 Tricep (C7) 5/5 5/5 Wrist Flexion (C7) 5/5 5/5 Grip (C8) 5/5 5/5 Finger Abduction (T1) 5/5 5/5   Sensation: Decreased sensation over posterior left forearm compared to right.  Otherwise, intact to light touch in upper extremities bilaterally   Spurling's:  negative bilaterally Neck ROM: Full active ROM  TTP: cervical spinous processes, cervical paraspinal, thoracic paraspinal, trapezius    General: Well-appearing, cooperative, sitting comfortably in no acute distress.    OMT Physical Exam:   Cervical: TTP paraspinal, C3 RRSL Rib: Bilateral elevated first rib with TTP, worse on left Thoracic: TTP paraspinal, T6 RRSR   Electronically signed by:  Aleen Sells D.[facility] 4:16 PM 04/17/23

## 2023-04-17 ENCOUNTER — Ambulatory Visit (INDEPENDENT_AMBULATORY_CARE_PROVIDER_SITE_OTHER): Admitting: Sports Medicine

## 2023-04-17 VITALS — BP 130/78 | Ht 63.0 in | Wt 277.0 lb

## 2023-04-17 DIAGNOSIS — M9908 Segmental and somatic dysfunction of rib cage: Secondary | ICD-10-CM | POA: Diagnosis not present

## 2023-04-17 DIAGNOSIS — M5412 Radiculopathy, cervical region: Secondary | ICD-10-CM

## 2023-04-17 DIAGNOSIS — M9901 Segmental and somatic dysfunction of cervical region: Secondary | ICD-10-CM | POA: Diagnosis not present

## 2023-04-17 DIAGNOSIS — M542 Cervicalgia: Secondary | ICD-10-CM

## 2023-04-17 DIAGNOSIS — M9902 Segmental and somatic dysfunction of thoracic region: Secondary | ICD-10-CM | POA: Diagnosis not present

## 2023-04-17 NOTE — Patient Instructions (Signed)
-   Start meloxicam 15 mg daily x2 weeks.  If still having pain after 2 weeks, complete 3rd-week of NSAID. May use remaining NSAID as needed once daily for pain control.  Do not to use additional over-the-counter NSAIDs (ibuprofen, naproxen, Advil, Aleve, etc.) while taking prescription NSAIDs.  May use Tylenol [phone] mg 2 to 3 times a day for breakthrough pain. 4 week follow up

## 2023-05-11 NOTE — Progress Notes (Deleted)
 [name] [address] [place] [address] Phone: [phone]   Assessment and Plan:     There are no diagnoses linked to this encounter.  *** - Patient has received relief with OMT in the past.  Elects for repeat OMT today.  Tolerated well per note below. - Decision today to treat with OMT was based on Physical Exam   After verbal consent patient was treated with HVLA (high velocity low amplitude), ME (muscle energy), FPR (flex positional release), ST (soft tissue), PC/PD (Pelvic Compression/ Pelvic Decompression) techniques in cervical, rib, thoracic, lumbar, and pelvic areas. Patient tolerated the procedure well with improvement in symptoms.  Patient educated on potential side effects of soreness and recommended to rest, hydrate, and use Tylenol as needed for pain control.   Pertinent previous records reviewed include ***    Follow Up: ***     Subjective:   I, Amanda [name], am serving as a Neurosurgeon for Doctor Richardean Sale   Chief Complaint: neck pain    HPI:    12/07/22 Patient is a 43 year old female with concerns of neck pain. Patient states that she has had neck pain for years. Pain radiates to her head and gives her headaches. Does get numbness to her hand. Is in PT. Muscle relaxer helps a little. Decrease ROM.    01/04/2023 Patient states that she is okay . OMT really helped . Meloxicam didn't really do much    01/31/2022 Patient states that she is doing fine    03/01/2023 Patient states arm is hurting really bad right now and have a headache that has been going on for about two weeks.    04/17/2023 Patient states she is a little tight   05/15/2023 Patient states   Relevant Historical Information: GERD  Additional pertinent review of systems negative.  Current Outpatient Medications  Medication Sig Dispense Refill   dicyclomine (BENTYL) 10 MG capsule Take 1 capsule (10 mg total) by mouth every 6 (six) hours as  needed for spasms. 30 capsule 3   esomeprazole (NEXIUM) 40 MG capsule Take 1 capsule (40 mg total) by mouth 2 (two) times daily before a meal. 180 capsule 3   famotidine (PEPCID) 20 MG tablet Take 20 mg by mouth 2 (two) times daily.     fluticasone (FLONASE) 50 MCG/ACT nasal spray Place 2 sprays into both nostrils daily. 16 g 0   linaclotide (LINZESS) 145 MCG CAPS capsule Take 1 capsule (145 mcg total) by mouth daily before breakfast. 90 capsule 3   meloxicam (MOBIC) 15 MG tablet Take 1 tablet (15 mg total) by mouth daily as needed for pain. 30 tablet 0   ondansetron (ZOFRAN ODT) 4 MG disintegrating tablet Take 1 tablet (4 mg total) by mouth every 8 (eight) hours as needed for nausea or vomiting. 20 tablet 5   SUMAtriptan ([name]) 20 MG/ACT nasal spray 1 spray in nostril.  May repeat x1 in 2 hours if headache persists or recurs. 10 each 5   No current facility-administered medications for this visit.      Objective:     There were no vitals filed for this visit.    There is no height or weight on file to calculate BMI.    Physical Exam:     General: Well-appearing, cooperative, sitting comfortably in no acute distress.   OMT Physical Exam:  ASIS Compression Test: Positive Right Cervical: TTP paraspinal, *** Rib: Bilateral elevated first rib with TTP Thoracic:  TTP paraspinal,*** Lumbar: TTP paraspinal,*** Pelvis: Right anterior innominate  Electronically signed by:  Marshall Skeeter D.[facility] 7:28 AM 05/11/23

## 2023-05-15 ENCOUNTER — Ambulatory Visit: Admitting: Sports Medicine

## 2023-05-23 ENCOUNTER — Inpatient Hospital Stay: Payer: Medicaid Other | Attending: Hematology

## 2023-05-23 DIAGNOSIS — D649 Anemia, unspecified: Secondary | ICD-10-CM

## 2023-05-23 DIAGNOSIS — D500 Iron deficiency anemia secondary to blood loss (chronic): Secondary | ICD-10-CM | POA: Diagnosis present

## 2023-05-23 LAB — CBC WITH DIFFERENTIAL/PLATELET
Abs Immature Granulocytes: 0.02 10*3/uL (ref 0.00–0.07)
Basophils Absolute: 0.1 10*3/uL (ref 0.0–0.1)
Basophils Relative: 1 %
Eosinophils Absolute: 0.2 10*3/uL (ref 0.0–0.5)
Eosinophils Relative: 3 %
HCT: 35.7 % — ABNORMAL LOW (ref 36.0–46.0)
Hemoglobin: 11.8 g/dL — ABNORMAL LOW (ref 12.0–15.0)
Immature Granulocytes: 0 %
Lymphocytes Relative: 15 %
Lymphs Abs: 1.1 10*3/uL (ref 0.7–4.0)
MCH: 24.4 pg — ABNORMAL LOW (ref 26.0–34.0)
MCHC: 33.1 g/dL (ref 30.0–36.0)
MCV: 73.8 fL — ABNORMAL LOW (ref 80.0–100.0)
Monocytes Absolute: 0.5 10*3/uL (ref 0.1–1.0)
Monocytes Relative: 6 %
Neutro Abs: 5.6 10*3/uL (ref 1.7–7.7)
Neutrophils Relative %: 75 %
Platelets: 242 10*3/uL (ref 150–400)
RBC: 4.84 MIL/uL (ref 3.87–5.11)
RDW: 16.2 % — ABNORMAL HIGH (ref 11.5–15.5)
WBC: 7.4 10*3/uL (ref 4.0–10.5)
nRBC: 0 % (ref 0.0–0.2)

## 2023-05-23 LAB — FERRITIN: Ferritin: 23 ng/mL (ref 11–307)

## 2023-05-30 ENCOUNTER — Encounter: Payer: Self-pay | Admitting: Sports Medicine

## 2023-06-01 NOTE — Progress Notes (Deleted)
 NEUROLOGY FOLLOW UP OFFICE NOTE  Amanda [name] [id number]  Assessment/Plan:   Migraine without aura, without status migrainosus, not intractable - aggravated by underlying emotional stress Left C8 radiculopathy   Migraine prevention:  She would like to avoid pills.  As her neck is likely a trigger for her migraines, will refer her to Sports Medicine.  Otherwise, advised her to reconsider Botox Migraine rescue:  Sumatriptan  NS (rarely Midol ) Flexeril .  Zofran  for nausea  Limit use of pain relievers to no more than 2 days out of week to prevent risk of rebound or medication-overuse headache. Keep headache diary Follow up 6 months.     Subjective:  Amanda [name] is a 43 year old right-handed woman with asthma, depression, anxiety and morbid obesity who follows up for chronic migraine.     UPDATE: Referred to Sports Medicine for neck pain.  She saw Dr. Cleora Daft in March and received OMT.  Intensity:  moderate - severe Duration:  60 minutes and nap with sumatriptan   Frequency:  3 days a week (severe during week of menses)    Rescue protocol:  sumatriptan  NS Current NSAIDS:  none Current analgesics:  none Current triptans:  sumatriptan  20mg  NS Current ergotamine:  none Current anti-emetic:  Zofran  ODT 4mg  Current muscle relaxants:  Flexeril  10mg  TID PRN headache Current anti-anxiolytic:  none Current sleep aide:  none Current Antihypertensive medications:  none Current Antidepressant medications:  none Current Anticonvulsant medications:  none Current anti-CGRP: none Current Vitamins/Herbal/Supplements:  prenatal Current Antihistamines/Decongestants:  Flonase , Zyrtec  Other therapy:  chiropractic medicine Hormone/birth control:  none   Caffeine:  no Alcohol:  no Smoker:  no Diet:  Cut out pork, cheese, chocolate.  Increased water  intake Exercise:  walking Depression/stress:  stress Sleep hygiene:  poor   HISTORY: Onset:  adolescence Location:   Holocephalic from neck and shoulders to behind the eyes Quality:  Throbbing, pressure Initial intensity:  10/10 Aura:  no Prodrome:  no Associated symptoms:  Dizziness, nausea, photophobia, phonophobia Initial Duration:  constant Initial Frequency:  Daily (16-20 days severe) Triggers:  Emotional stress, getting upset, menstrual cycle, certain perfumes, sun light Relieving factors:  none Activity:  Difficult to function when severe   Her headaches improved during her pregnancy but returned post-partum.   Due to worsening headaches, an MRI of the brain with and without contrast was performed on 11/04/2018 and was normal.  Now  having pain and numbness in the last 2 fingers of her left hand radiating up the ulnar aspect of her forearm and lateral arm to the shoulder but the neck.  Had NCV-EMG on 08/01/2022 which revealed evidence of mild chronic C8 radiculopathy but no evidence of left ulnar neuropathy or carpal tunnel syndrome.  She was referred to PT for left C8 cervical radiculopathy.  It has helped.     Past NSAIDS:  Advil , Aleve  Past analgesics:  BC powder, Tylenol , Midol  Past abortive triptans:  Sumatriptan  100mg , sumatriptan  NS, Maxalt , Relpax  Past abortive ergotamine:  none Past muscle relaxants:  tizanidine Past anti-emetic:  Zofran  Past antihypertensive medications:  would not use beta blockers due to underlying asthma. Past antidepressant medications:  Nortriptyline  25mg  (side effects), Zoloft Past anticonvulsant medications:  topiramate  100mg  (ineffective), gabapentin Past anti-CGRP:  Aimovig  70mg  (helped but it made her feel hot, lightheaded, nauseous with vomiting), Emgality , Nurtec (rescue), Qulipta  60mg  (caused nausea).  Would rather avoid injection Past vitamins/Herbal/Supplements:  none Past antihistamines/decongestants:  none Other past therapies:  Trigger point injections, OMT   Family history of  headaches:  No  PAST MEDICAL HISTORY: Past Medical History:  Diagnosis  Date   Anginal pain (HCC)    r/t anxiety   Anxiety    on meds   Asthma    no inhaler - seasonal   Constipation    Depression    on meds   Dysrhythmia    Palpatations - r/t anxiety   Genital warts    GERD (gastroesophageal reflux disease)    on meds   H/O hiatal hernia    surgery to repair   Headache(784.0)    Heart murmur    History of low transverse cesarean section 02/12/2018   MRSA cellulitis    greater than 10 yrs ago dx at urgent care    Seasonal allergies    Sickle cell trait (HCC)     MEDICATIONS: Current Outpatient Medications on File Prior to Visit  Medication Sig Dispense Refill   dicyclomine  (BENTYL ) 10 MG capsule Take 1 capsule (10 mg total) by mouth every 6 (six) hours as needed for spasms. 30 capsule 3   esomeprazole  (NEXIUM ) 40 MG capsule Take 1 capsule (40 mg total) by mouth 2 (two) times daily before a meal. 180 capsule 3   famotidine  (PEPCID ) 20 MG tablet Take 20 mg by mouth 2 (two) times daily.     fluticasone  (FLONASE ) 50 MCG/ACT nasal spray Place 2 sprays into both nostrils daily. 16 g 0   linaclotide  (LINZESS ) 145 MCG CAPS capsule Take 1 capsule (145 mcg total) by mouth daily before breakfast. 90 capsule 3   meloxicam  (MOBIC ) 15 MG tablet Take 1 tablet (15 mg total) by mouth daily as needed for pain. 30 tablet 0   ondansetron  (ZOFRAN  ODT) 4 MG disintegrating tablet Take 1 tablet (4 mg total) by mouth every 8 (eight) hours as needed for nausea or vomiting. 20 tablet 5   SUMAtriptan  ([name] ) 20 MG/ACT nasal spray 1 spray in nostril.  May repeat x1 in 2 hours if headache persists or recurs. 10 each 5   No current facility-administered medications on file prior to visit.    ALLERGIES: Allergies  Allergen Reactions   Aimovig  [Erenumab -Aooe]     Hot/lightheaded/nausea and vomiting   Doxycycline Hyclate Hives, Itching and Swelling   Ketorolac  Tromethamine  Other (See Comments)    Patient stated that it makes her extremely hyper    FAMILY  HISTORY: Family History  Problem Relation Age of Onset   Hypertension Mother    Depression Mother    Depression Father    Breast cancer Maternal Aunt        Great Aunt   Cancer Maternal Aunt    Cancer Maternal Uncle        UNKNOWN TYPE CANCER   Heart disease Maternal Grandmother    Diabetes Maternal Grandfather    Heart disease Maternal Grandfather    Hypertension Maternal Grandfather    Congestive Heart Failure Paternal Grandfather    Anesthesia problems Neg Hx    Colon polyps Neg Hx    Colon cancer Neg Hx    Esophageal cancer Neg Hx    Stomach cancer Neg Hx    Rectal cancer Neg Hx       Objective:  *** General: No acute distress.  Patient appears well-groomed.   ***   Janne Members, DO  CC: Vernette Goo, PA

## 2023-06-05 ENCOUNTER — Encounter: Payer: Self-pay | Admitting: Neurology

## 2023-06-05 ENCOUNTER — Ambulatory Visit: Payer: Self-pay | Admitting: Neurology

## 2023-06-06 ENCOUNTER — Encounter: Payer: Self-pay | Admitting: Neurology

## 2023-06-07 ENCOUNTER — Telehealth: Payer: Self-pay | Admitting: Neurology

## 2023-06-07 NOTE — Telephone Encounter (Signed)
 We find it necessary to inform you that [facility] Neurology and app providers practicing at this clinic will no longer be able to provide medical care to you.  We made this decision due to our No SHOW POLICY. 06/06/23

## 2023-08-22 ENCOUNTER — Inpatient Hospital Stay: Payer: Medicaid Other | Attending: Hematology

## 2023-08-22 ENCOUNTER — Inpatient Hospital Stay (HOSPITAL_BASED_OUTPATIENT_CLINIC_OR_DEPARTMENT_OTHER): Payer: Medicaid Other | Admitting: Hematology

## 2023-08-22 VITALS — BP 119/84 | HR 67 | Temp 98.0°F | Resp 19 | Wt 271.0 lb

## 2023-08-22 DIAGNOSIS — Z79899 Other long term (current) drug therapy: Secondary | ICD-10-CM | POA: Insufficient documentation

## 2023-08-22 DIAGNOSIS — D500 Iron deficiency anemia secondary to blood loss (chronic): Secondary | ICD-10-CM | POA: Diagnosis present

## 2023-08-22 DIAGNOSIS — Z98891 History of uterine scar from previous surgery: Secondary | ICD-10-CM | POA: Diagnosis not present

## 2023-08-22 DIAGNOSIS — D573 Sickle-cell trait: Secondary | ICD-10-CM | POA: Insufficient documentation

## 2023-08-22 DIAGNOSIS — Z8614 Personal history of Methicillin resistant Staphylococcus aureus infection: Secondary | ICD-10-CM | POA: Insufficient documentation

## 2023-08-22 DIAGNOSIS — D649 Anemia, unspecified: Secondary | ICD-10-CM

## 2023-08-22 DIAGNOSIS — J45909 Unspecified asthma, uncomplicated: Secondary | ICD-10-CM | POA: Insufficient documentation

## 2023-08-22 DIAGNOSIS — N920 Excessive and frequent menstruation with regular cycle: Secondary | ICD-10-CM | POA: Insufficient documentation

## 2023-08-22 DIAGNOSIS — K5900 Constipation, unspecified: Secondary | ICD-10-CM | POA: Insufficient documentation

## 2023-08-22 LAB — CBC WITH DIFFERENTIAL/PLATELET
Abs Immature Granulocytes: 0.01 K/uL (ref 0.00–0.07)
Basophils Absolute: 0 K/uL (ref 0.0–0.1)
Basophils Relative: 1 %
Eosinophils Absolute: 0.1 K/uL (ref 0.0–0.5)
Eosinophils Relative: 2 %
HCT: 36 % (ref 36.0–46.0)
Hemoglobin: 11.8 g/dL — ABNORMAL LOW (ref 12.0–15.0)
Immature Granulocytes: 0 %
Lymphocytes Relative: 17 %
Lymphs Abs: 1.1 K/uL (ref 0.7–4.0)
MCH: 25.2 pg — ABNORMAL LOW (ref 26.0–34.0)
MCHC: 32.8 g/dL (ref 30.0–36.0)
MCV: 76.9 fL — ABNORMAL LOW (ref 80.0–100.0)
Monocytes Absolute: 0.4 K/uL (ref 0.1–1.0)
Monocytes Relative: 6 %
Neutro Abs: 4.7 K/uL (ref 1.7–7.7)
Neutrophils Relative %: 74 %
Platelets: 182 K/uL (ref 150–400)
RBC: 4.68 MIL/uL (ref 3.87–5.11)
RDW: 16.5 % — ABNORMAL HIGH (ref 11.5–15.5)
WBC: 6.3 K/uL (ref 4.0–10.5)
nRBC: 0 % (ref 0.0–0.2)

## 2023-08-22 LAB — FERRITIN: Ferritin: 19 ng/mL (ref 11–307)

## 2023-08-22 NOTE — Progress Notes (Signed)
 [facility]   [phone] [phone]   Clinic Follow up Note   Patient Care Team: Rosalea Rosina SAILOR, PA as PCP - General (Physician Assistant) Skeet Juliene SAUNDERS, DO as Consulting Physician (Neurology) Rosalea Rosina SAILOR, [place] (Physician Assistant)  Date of Service:  08/22/2023  CHIEF COMPLAINT: f/u of iron deficient anemia  CURRENT THERAPY:  Oral iron   Oncology History   Iron deficiency anemia due to chronic blood loss Iron deficient anemia secondary to menorrhagia -Intermittent anemia over the past 10 years, hemoglobin around 10-11. -On oral iron's.  Assessment & Plan Iron deficiency anemia Mild iron deficiency anemia with hemoglobin at 11.8 g/dL and low MCV, indicating ongoing iron deficiency. Ferritin level improved to 23 from previous 10-11. Symptoms include mild fatigue, with no severe anemia symptoms. Heavy menstrual periods may contribute to anemia. Gastrointestinal issues and constipation with oral iron lead to preference for dietary iron intake. Declined IV iron therapy. Birth control pills discussed to manage menstrual bleeding and potentially improve anemia. She agreed to try birth control pills to reduce menstrual bleeding and improve anemia without causing constipation. - Encourage intake of iron-rich foods. - Start birth control pills to manage menstrual bleeding and reduce anemia. - Monitor blood counts with primary care physician once or twice a year. - Schedule follow-up in one year unless symptoms worsen or blood counts drop significantly. - Check iron studies via MyChart when available.   Plan - Due to constipation from iron pill, she has not been taking lately - I encouraged her to start birth control pill, to reduce to her menorrhagia - Continue iron rich food - Given the mild and stable anemia, I will see her in 1 year.  I encouraged her to have lab checked with her PCP.    Discussed the use of AI scribe software for  clinical note transcription with the patient, who gave verbal consent to proceed.  History of Present Illness Amanda [name] is a 43 year old female with iron deficiency anemia who presents for follow-up.  She is not taking oral iron supplements due to gastrointestinal issues, including stomach problems and constipation, and is attempting to manage her condition through iron-enriched foods. She feels sluggish, which may be related to her anemia or lack of sleep.  Her blood counts are stable with a hemoglobin level of 11.8 and a persistently low mean corpuscular volume. Ferritin levels have improved to 23 from previous levels of 10-11.  She experiences heavy menstrual periods, which have increased with age. She was previously prescribed birth control to manage this but has not taken it due to concerns about medication intake.  There is no history of blood clots. She denies chewing ice but prefers ice in her drinks and would eat a popsicle before chewing ice.     All other systems were reviewed with the patient and are negative.  MEDICAL HISTORY:  Past Medical History:  Diagnosis Date   Anginal pain (HCC)    r/t anxiety   Anxiety    on meds   Asthma    no inhaler - seasonal   Constipation    Depression    on meds   Dysrhythmia    Palpatations - r/t anxiety   Genital warts    GERD (gastroesophageal reflux disease)    on meds   H/O hiatal hernia    surgery to repair   Headache(784.0)    Heart murmur    History of low transverse cesarean section 02/12/2018  MRSA cellulitis    greater than 10 yrs ago dx at urgent care    Seasonal allergies    Sickle cell trait (HCC)     SURGICAL HISTORY: Past Surgical History:  Procedure Laterality Date   CERVICAL CONE BIOPSY     CESAREAN SECTION  01/20/2012   Procedure: CESAREAN SECTION;  Surgeon: Debby JULIANNA Lares, MD;  Location: [facility];  Service: Obstetrics;  Laterality: N/A;   CESAREAN SECTION WITH BILATERAL TUBAL LIGATION N/A  02/13/2018   Procedure: CESAREAN SECTION WITH BILATERAL TUBAL LIGATION;  Surgeon: Danielle Rom, MD;  Location: [facility];  Service: Obstetrics;  Laterality: N/A;  Powell, RNFA  Needs [name]   COLONOSCOPY     DILATION AND EVACUATION N/A 03/30/2017   Procedure: DILATATION AND EVACUATION WITH ULTRASOUND GUIDANCE;  Surgeon: Danielle Rom, MD;  Location: [facility];  Service: Gynecology;  Laterality: N/A;   HERNIA REPAIR     INSERTION OF MESH N/A 05/15/2015   Procedure: INSERTION OF MESH;  Surgeon: Lynda Leos, MD;  Location: [facility];  Service: General;  Laterality: N/A;   MARSUPIALIZATION URETHRAL DIVERTICULUM     UMBILICAL HERNIA REPAIR N/A 05/15/2015   Procedure: LAPAROSCOPIC UMBILICAL HERNIA;  Surgeon: Lynda Leos, MD;  Location: [facility];  Service: General;  Laterality: N/A;   UPPER GI ENDOSCOPY     WISDOM TOOTH EXTRACTION      I have reviewed the social history and family history with the patient and they are unchanged from previous note.  ALLERGIES:  is allergic to aimovig  [erenumab -aooe], doxycycline hyclate, and ketorolac  tromethamine .  MEDICATIONS:  Current Outpatient Medications  Medication Sig Dispense Refill   dicyclomine  (BENTYL ) 10 MG capsule Take 1 capsule (10 mg total) by mouth every 6 (six) hours as needed for spasms. 30 capsule 3   esomeprazole  (NEXIUM ) 40 MG capsule Take 1 capsule (40 mg total) by mouth 2 (two) times daily before a meal. 180 capsule 3   famotidine  (PEPCID ) 20 MG tablet Take 20 mg by mouth 2 (two) times daily.     fluticasone  (FLONASE ) 50 MCG/ACT nasal spray Place 2 sprays into both nostrils daily. 16 g 0   linaclotide  (LINZESS ) 145 MCG CAPS capsule Take 1 capsule (145 mcg total) by mouth daily before breakfast. 90 capsule 3   meloxicam  (MOBIC ) 15 MG tablet Take 1 tablet (15 mg total) by mouth daily as needed for pain. 30 tablet 0   ondansetron  (ZOFRAN  ODT) 4 MG disintegrating tablet Take 1 tablet (4 mg total) by mouth every 8 (eight)  hours as needed for nausea or vomiting. 20 tablet 5   SUMAtriptan  ([name] ) 20 MG/ACT nasal spray 1 spray in nostril.  May repeat x1 in 2 hours if headache persists or recurs. 10 each 5   No current facility-administered medications for this visit.    PHYSICAL EXAMINATION: ECOG PERFORMANCE STATUS: 0 - Asymptomatic  Vitals:   08/22/23 1527  BP: 119/84  Pulse: 67  Resp: 19  Temp: 98 F (36.7 C)  SpO2: 100%   Wt Readings from Last 3 Encounters:  08/22/23 271 lb (122.9 kg)  04/17/23 277 lb (125.6 kg)  02/22/23 277 lb 3.2 oz (125.7 kg)     GENERAL:alert, no distress and comfortable SKIN: skin color, texture, turgor are normal, no rashes or significant lesions EYES: normal, Conjunctiva are pink and non-injected, sclera clear NECK: supple, thyroid normal size, non-tender, without nodularity LYMPH:  no palpable lymphadenopathy in the cervical, axillary  LUNGS: clear to auscultation and percussion with normal breathing effort  HEART: regular rate & rhythm and no murmurs and no lower extremity edema ABDOMEN:abdomen soft, non-tender and normal bowel sounds Musculoskeletal:no cyanosis of digits and no clubbing  NEURO: alert & oriented x 3 with fluent speech, no focal motor/sensory deficits  Physical Exam    LABORATORY DATA:  I have reviewed the data as listed    Latest Ref Rng & Units 08/22/2023    2:49 PM 05/23/2023    9:43 AM 02/22/2023   10:00 AM  CBC  WBC 4.0 - 10.5 K/uL 6.3  7.4  6.0   Hemoglobin 12.0 - 15.0 g/dL 88.1  88.1  88.2   Hematocrit 36.0 - 46.0 % 36.0  35.7  36.9   Platelets 150 - 400 K/uL 182  242  229         Latest Ref Rng & Units 11/24/2022    9:17 PM 03/22/2022    5:29 AM 10/09/2020    1:31 PM  CMP  Glucose 70 - 99 mg/dL 90  870  84   BUN 6 - 20 mg/dL 10  12  7    Creatinine 0.44 - 1.00 mg/dL 9.22  9.23  9.27   Sodium 135 - 145 mmol/L 137  138  140   Potassium 3.5 - 5.1 mmol/L 3.8  3.8  4.1   Chloride 98 - 111 mmol/L 102  103  104   CO2 22 - 32  mmol/L 25  22  26    Calcium  8.9 - 10.3 mg/dL 9.2  9.1  9.0   Total Protein 6.5 - 8.1 g/dL  7.2  6.7   Total Bilirubin 0.3 - 1.2 mg/dL  0.6  0.7   Alkaline Phos 38 - 126 U/L  66  54   AST 15 - 41 U/L  26  16   ALT 0 - 44 U/L  25  13       RADIOGRAPHIC STUDIES: I have personally reviewed the radiological images as listed and agreed with the findings in the report. No results found.    No orders of the defined types were placed in this encounter.  All questions were answered. The patient knows to call the clinic with any problems, questions or concerns. No barriers to learning was detected. The total time spent in the appointment was 15 minutes, including review of chart and various tests results, discussions about plan of care and coordination of care plan     Onita Mattock, MD 08/22/2023

## 2023-08-22 NOTE — Assessment & Plan Note (Signed)
 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 -On oral iron's.

## 2023-11-17 ENCOUNTER — Ambulatory Visit: Admitting: Gastroenterology

## 2023-12-06 ENCOUNTER — Encounter: Payer: Self-pay | Admitting: Gastroenterology

## 2023-12-06 ENCOUNTER — Ambulatory Visit: Admitting: Gastroenterology

## 2023-12-06 VITALS — BP 110/70 | HR 84 | Ht 63.0 in | Wt 269.0 lb

## 2023-12-06 DIAGNOSIS — K602 Anal fissure, unspecified: Secondary | ICD-10-CM

## 2023-12-06 DIAGNOSIS — K5900 Constipation, unspecified: Secondary | ICD-10-CM

## 2023-12-06 DIAGNOSIS — K648 Other hemorrhoids: Secondary | ICD-10-CM | POA: Diagnosis not present

## 2023-12-06 DIAGNOSIS — K219 Gastro-esophageal reflux disease without esophagitis: Secondary | ICD-10-CM

## 2023-12-06 DIAGNOSIS — R278 Other lack of coordination: Secondary | ICD-10-CM

## 2023-12-06 MED ORDER — AMBULATORY NON FORMULARY MEDICATION: 0 refills | Status: AC

## 2023-12-06 MED ORDER — FAMOTIDINE 20 MG PO TABS: 20.0000 mg | ORAL_TABLET | Freq: Two times a day (BID) | ORAL | 3 refills | Status: AC

## 2023-12-06 MED ORDER — DICYCLOMINE HCL 10 MG PO CAPS: 10.0000 mg | ORAL_CAPSULE | Freq: Four times a day (QID) | ORAL | 3 refills | Status: AC | PRN

## 2023-12-06 NOTE — Patient Instructions (Addendum)
 _______________________________________________________  If your blood pressure at your visit was 140/90 or greater, please contact your primary care physician to follow up on this.  _______________________________________________________  If you are age 41 or older, your body mass index should be between 23-30. Your Body mass index is 47.65 kg/m. If this is out of the aforementioned range listed, please consider follow up with your Primary Care Provider.  If you are age 63 or younger, your body mass index should be between 19-25. Your Body mass index is 47.65 kg/m. If this is out of the aformentioned range listed, please consider follow up with your Primary Care Provider.   ________________________________________________________  The [facility] providers would like to encourage you to use MYCHART to communicate with providers for non-urgent requests or questions.  Due to long hold times on the telephone, sending your provider a message by [facility] may be a faster and more efficient way to get a response.  Please allow 48 business hours for a response.  Please remember that this is for non-urgent requests.  _______________________________________________________  [facility] is using a team-based approach to care.  Your team is made up of your doctor and two to three APPS. Our APPS (Nurse Practitioners and Physician Assistants) work with your physician to ensure care continuity for you. They are fully qualified to address your health concerns and develop a treatment plan. They communicate directly with your gastroenterologist to care for you. Seeing the Advanced Practice Practitioners on your physician's team can help you by facilitating care more promptly, often allowing for earlier appointments, access to diagnostic testing, procedures, and other specialty referrals.   We have sent the following medications to your pharmacy for you to pick up at your convenience: Bentyl  10mg  take 1  capsule every 6 hours as needed. Famotidine  20mg  one tablet 2 times daily   We have sent a prescription for nitroglycerin 0.125% gel to [facility]. You should apply a pea size amount to your rectum two times daily x 6-8 weeks.  [facility]'s information is below: Address: [address] [place], [place] [address]  [phone]  *Please DO NOT go directly from our office to pick up this medication! Give the pharmacy 1 day to process the prescription as this is compounded and takes time to make.   We have referred you to Pelvic Floor Therapy.  Someone should contact you with an appointment.  If you have not heard from their office in 1 to 2 weeks, please let our office know.   It was a pleasure to see you today!  Vito Cirigliano, D.O.

## 2023-12-06 NOTE — Progress Notes (Signed)
 Chief Complaint:    Symptomatic hemorrhoids, IBS, medication refill  GI History: 43 year old female with a history of anxiety, depression, asthma, sickle cell trait, GERD, obesity (BMI 47), migraines, umbilical hernia repair with mesh in 04/2015, cesarean.  Previously followed with [facility] for GERD and constipation.  Reports longstanding history of GERD for many years.  Index symptoms of heartburn, regurgitation, nausea.  No dysphagia.  Has been treated with Prilosec, famotidine .  Changed to Nexium  40 mg twice daily in 10/2021.  Reports having a history of H. pylori treated 4 times in the past.  Most recent was diagnosed in 12/2021 by EGD and completed quadruple therapy with successful eradication on subsequent breath testing in 02/2022.  History of chronic constipation with occasional diarrhea.  Has trialed MiraLAX , stool softeners, Linzess  (unsure if this was efficacious).  Prescribed Linzess  145 mcg daily and Bentyl  in 10/2021.   Endoscopic history: - 2013: EGD: Normal - 2013: Colonoscopy: Normal - 12/2021: Colonoscopy: 3 mm sigmoid hyperplastic polyp,, small internal hemorrhoids, otherwise normal (path benign).  Recommended repeat in 10 years - 12/2021: EGD: Normal but gastric biopsies notable for Helicobacter pylori infection and prescribed quadruple therapy. - 02/2022: H. pylori breath test negative consistent with eradication  HPI:     Patient is a 43 y.o. female presenting to the Gastroenterology Clinic for evaluation of symptomatic bites.  Was last seen in the GI clinic on 10/29/2021 for evaluation of reflux with subsequent EGD in 12/2021 as above.  Has not been seen since then.  Main issue today is episodic BRB on tissue paper.  Continues to have constipation described as pushing/straining to have BM.  Was previously prescribed Linzess , but only took 1 tablet and stopped due to abdominal cramping.  Has not been using any stool softeners or laxatives.  Does have dyschezia at  times.  Not necessarily hard stools, but increased straining to facilitate BM.  For the BRB on tissue paper, has been using hemorrhoid cream, Tucks wipes.    Separately, requesting refill of Bentyl  and famotidine .  Reflux symptoms otherwise largely controlled with famotidine  and Nexium .   -02/2022: CT A/P (indication: Nausea/vomiting, abdominal pain): Normal   Review of systems:     No chest pain, no SOB, no fevers, no urinary sx   Past Medical History:  Diagnosis Date   Anginal pain    r/t anxiety   Anxiety    on meds   Asthma    no inhaler - seasonal   Constipation    Depression    on meds   Dysrhythmia    Palpatations - r/t anxiety   Genital warts    GERD (gastroesophageal reflux disease)    on meds   H/O hiatal hernia    surgery to repair   Headache(784.0)    Heart murmur    History of low transverse cesarean section 02/12/2018   MRSA cellulitis    greater than 10 yrs ago dx at urgent care    Seasonal allergies    Sickle cell trait     Patient's surgical history, family medical history, social history, medications and allergies were all reviewed in Epic    Current Outpatient Medications  Medication Sig Dispense Refill   dicyclomine  (BENTYL ) 10 MG capsule Take 1 capsule (10 mg total) by mouth every 6 (six) hours as needed for spasms. 30 capsule 3   esomeprazole  (NEXIUM ) 40 MG capsule Take 1 capsule (40 mg total) by mouth 2 (two) times daily before a meal. 180 capsule 3  famotidine  (PEPCID ) 20 MG tablet Take 20 mg by mouth 2 (two) times daily.     fluticasone  (FLONASE ) 50 MCG/ACT nasal spray Place 2 sprays into both nostrils daily. 16 g 0   linaclotide  (LINZESS ) 145 MCG CAPS capsule Take 1 capsule (145 mcg total) by mouth daily before breakfast. 90 capsule 3   meloxicam  (MOBIC ) 15 MG tablet Take 1 tablet (15 mg total) by mouth daily as needed for pain. 30 tablet 0   ondansetron  (ZOFRAN  ODT) 4 MG disintegrating tablet Take 1 tablet (4 mg total) by mouth every 8  (eight) hours as needed for nausea or vomiting. 20 tablet 5   SUMAtriptan  ([name] ) 20 MG/ACT nasal spray 1 spray in nostril.  May repeat x1 in 2 hours if headache persists or recurs. 10 each 5   No current facility-administered medications for this visit.    Physical Exam:     BP 110/70 (BP Location: Right Arm, Patient Position: Sitting, Cuff Size: Large)   Pulse 84   Ht 5' 3 (1.6 m)   Wt 269 lb (122 kg)   BMI 47.65 kg/m   GENERAL:  Pleasant female in NAD PSYCH: : Cooperative, normal affect Musculoskeletal:  Normal muscle tone, normal strength NEURO: Alert and oriented x 3, no focal neurologic deficits Rectal exam: Small external skin tag.  External anal fissure in 9 o'clock position (when laying left lateral) with exquisite TTP.  Sensation intact and preserved anal wink.  Could not assess sphincter tone or pelvic floor functionality due to tenderness from fissure.   (Chaperone: Nat Sic, CMA).    IMPRESSION and PLAN:    1) Anal Fissure: Physical exam notable for external anal fissure in the 9:00 position. Will treat as below:  - Start topical NTG 0.125%, apply a small, pea-sized amount to the affected area BID for 6-8 weeks.  - We discussed the ADR of headache, and if patient does experience, to call me and will change and make pharmacy request to compound topical CCB (topical nifedipine 0.2-0.3% applied 2-4 times daily; unfortunately, the CCB also carries an ADR of headache in 5-12%). Additionally, cautioned to avoid strenuous activity within 30 minutes of application  - Start fiber supplement for a goal of regular, soft, stools without straining to have a bowel movement. If unable to achieve with fiber alone, or if sxs are exacerbated with fiber, can consider starting oral laxative agent (ie, Miralax ) to improve stool consistency and aid in fissure healing  - Sitz bath with warm water  for 10-15 minutes 2-3 times daily; directed to [employer] available online or at [employer]; ensure to dry area afterwards  2) Constipation 3) Pelvic floor dyssynergia Clinical presentation seems most consistent with pelvic floor dyssynergia.  Unable to fully assess today due to TTP from anal fissure.  Prior colonoscopy otherwise without luminal or mucosal etiology. - Referral for pelvic floor PT - Refill placed for Bentyl  for associated abdominal cramping  4) GERD - Well-controlled on current therapy - Continue current regimen - Refill placed for famotidine  - Continue antireflux lifestyle/dietary modification        Sandor GAILS Amanda [name] ,DO, FACG 12/06/2023, 9:28 AM

## 2023-12-07 ENCOUNTER — Encounter: Admitting: Physical Therapy

## 2023-12-07 NOTE — Therapy (Deleted)
 OUTPATIENT PHYSICAL THERAPY FEMALE PELVIC EVALUATION   Patient Name: Amanda [name] MRN: [id number] DOB:04-12-1980, 43 y.o., female Today's Date: 12/07/2023  END OF SESSION:   Past Medical History:  Diagnosis Date   Anginal pain    r/t anxiety   Anxiety    on meds   Asthma    no inhaler - seasonal   Constipation    Depression    on meds   Dysrhythmia    Palpatations - r/t anxiety   Genital warts    GERD (gastroesophageal reflux disease)    on meds   H/O hiatal hernia    surgery to repair   Headache(784.0)    Heart murmur    History of low transverse cesarean section 02/12/2018   MRSA cellulitis    greater than 10 yrs ago dx at urgent care    Seasonal allergies    Sickle cell trait    Past Surgical History:  Procedure Laterality Date   CERVICAL CONE BIOPSY     CESAREAN SECTION  01/20/2012   Procedure: CESAREAN SECTION;  Surgeon: Debby JULIANNA Lares, MD;  Location: [facility];  Service: Obstetrics;  Laterality: N/A;   CESAREAN SECTION WITH BILATERAL TUBAL LIGATION N/A 02/13/2018   Procedure: CESAREAN SECTION WITH BILATERAL TUBAL LIGATION;  Surgeon: Danielle Rom, MD;  Location: [facility];  Service: Obstetrics;  Laterality: N/A;  Powell, RNFA  Needs [name]   COLONOSCOPY     DILATION AND EVACUATION N/A 03/30/2017   Procedure: DILATATION AND EVACUATION WITH ULTRASOUND GUIDANCE;  Surgeon: Danielle Rom, MD;  Location: [facility];  Service: Gynecology;  Laterality: N/A;   HERNIA REPAIR     INSERTION OF MESH N/A 05/15/2015   Procedure: INSERTION OF MESH;  Surgeon: Lynda Leos, MD;  Location: [facility];  Service: General;  Laterality: N/A;   MARSUPIALIZATION URETHRAL DIVERTICULUM     UMBILICAL HERNIA REPAIR N/A 05/15/2015   Procedure: LAPAROSCOPIC UMBILICAL HERNIA;  Surgeon: Lynda Leos, MD;  Location: [facility];  Service: General;  Laterality: N/A;   UPPER GI ENDOSCOPY     WISDOM TOOTH EXTRACTION     Patient Active Problem List   Diagnosis Date  Noted   Iron deficiency anemia due to chronic blood loss 02/22/2023   Allergy history, drug 02/23/2022   Snoring 06/08/2021   Screening for cardiovascular condition 12/07/2018   Mixed hyperlipidemia 12/07/2018   Benign gestational thrombocytopenia 11/06/2018   Status post repeat low transverse cesarean section 02/13/2018   History of low transverse cesarean section 02/12/2018   Decreased fetal movement 02/07/2018   Migraine 09/03/2017   Maternal age 66+, multigravida, antepartum 07/24/2017   Maternal obesity affecting pregnancy, antepartum 07/24/2017   Constipation 07/24/2017   Sickle cell trait 07/24/2017   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 pylori infection 11/02/2015   Intractable chronic migraine without aura and without status migrainosus 03/19/2015   Morbid obesity (HCC) 03/19/2015   Murmur 06/20/2012   INSOMNIA 08/14/2009   DYSMENORRHEA 12/09/2008   FATIGUE 03/25/2008   DEPRESSION/ANXIETY 09/11/2007   Headache 09/11/2007   PALPITATIONS 02/23/2007   Atypical chest pain 02/23/2007   Allergic rhinitis 10/06/2006   Asthma 10/06/2006   GERD 10/06/2006   Asthma 10/06/2006    PCP: Rosalea Rosina SAILOR, PA  REFERRING PROVIDER: Cirigliano, Vito  V, DO   REFERRING DIAG:  K59.00 (ICD-10-CM) - Constipation, unspecified constipation type  K64.8 (ICD-10-CM) - Internal hemorrhoids  R27.8 (ICD-10-CM) - Dyssynergia    THERAPY DIAG:  No diagnosis found.  Rationale for Evaluation and Treatment: Rehabilitation  ONSET DATE: ***  SUBJECTIVE:                                                                                                                                                                                            SUBJECTIVE STATEMENT: pushing/straining to have BM ; External anal fissure in 9 o'clock position (when laying left lateral) with exquisite TTP.  Fluid intake:   FUNCTIONAL LIMITATIONS: ***  PERTINENT HISTORY:  Medications for current condition: topical NTG 0.125%  Surgeries: *** Other: anxiety, depression, asthma, sickle cell trait, GERD, obesity (BMI 47), migraines, umbilical hernia repair with mesh in 04/2015, cesarean.  Sexual abuse: {Yes/No:304960894}  PAIN:  Are you having pain? {yes/no:20286} NPRS scale: ***/10 Pain location: {pelvic pain location:27098}  Pain type: {type:313116} Pain description: {PAIN DESCRIPTION:21022940}   Aggravating factors: *** Relieving factors: ***  PRECAUTIONS: None  RED FLAGS: {PT Red Flags:29287}   WEIGHT BEARING RESTRICTIONS: No  FALLS:  Has patient fallen in last 6 months? {fallsyesno:27318}  OCCUPATION: ***  ACTIVITY LEVEL : ***  PLOF: {PLOF:24004}  PATIENT GOALS: ***   BOWEL MOVEMENT: Pain with bowel movement: {yes/no:20286} Type of bowel movement:{PT BM type:27100} Fully empty rectum: {No/Yes:304960894} Leakage: {Yes/No:304960894}                                                  Caused by: *** Bowel urgency: *** Pads: {Yes/No:304960894} Fiber supplement/laxative {YES/NO AS:20300}  URINATION: Pain with urination: {yes/no:20286} Fully empty bladder: {Yes/No:304960894}***                                         Post-void dribble: {YES/NO AS:20300} Stream: {PT urination:27102} Urgency: {YES/NO AS:20300} Frequency:during the day ***                                                        Nocturia: {Yes/No:304960894}***   Leakage: {PT leakage:27103} Pads/briefs: {Yes/No:304960894}  INTERCOURSE:  Ability to have vaginal penetration {YES/NO:21197} Pain with intercourse: {pain with intercourse PA:27099}  Dryness: {YES/NO AS:20300} Climax: *** Marinoff Scale: ***/3 Lubricant:  PREGNANCY: Vaginal deliveries  *** Tearing {Yes***/No:304960894} Episiotomy {YES/NO AS:20300} C-section deliveries *** Currently pregnant {Yes***/No:304960894}  PROLAPSE: {PT prolapse:27101}   OBJECTIVE:  Note: Objective measures were completed at Evaluation unless otherwise noted.  DIAGNOSTIC FINDINGS:  Post-void residual: Voiding Cystourethrogram (VCUG):  Ultrasound: ***  PATIENT SURVEYS:  {rehab surveys:24030}  PFIQ-7: *** UIQ-7 *** CRAIG -7 *** POPIQ-7 *** Female Sexual Function Index (FSFI) Questionnaire ***  COGNITION: Overall cognitive status: {cognition:24006}     SENSATION: Light touch: {intact/deficits:24005}  LUMBAR SPECIAL TESTS:  {lumbar special test:25242}  FUNCTIONAL TESTS:  {Functional tests:24029} Single leg stance:  Rt:  Lt: Sit-up test: Squat: Bed mobility:  GAIT: Assistive device utilized: {Assistive devices:23999} Comments: ***  POSTURE: {posture:25561}   LUMBARAROM/PROM:  A/PROM A/PROM  Eval (% available)  Flexion   Extension   Right lateral flexion   Left lateral flexion   Right rotation   Left rotation    (Blank rows = not tested)  LOWER EXTREMITY ROM:  {AROM/PROM:27142} ROM Right eval Left eval  Hip flexion    Hip extension    Hip abduction    Hip adduction    Hip internal rotation    Hip external rotation    Knee flexion    Knee extension    Ankle dorsiflexion    Ankle plantarflexion    Ankle inversion    Ankle eversion     (Blank rows = not tested)  LOWER EXTREMITY MMT:  MMT Right eval Left eval  Hip flexion    Hip extension    Hip abduction    Hip adduction    Hip internal rotation    Hip external rotation    Knee flexion    Knee extension    Ankle dorsiflexion    Ankle plantarflexion    Ankle inversion    Ankle eversion     (Blank rows = not tested) PALPATION:  General: ***  Pelvic Alignment: ***  Abdominal: ***  Diastasis: {Yes/No:304960894}*** Distortion: {YES/NO AS:20300}  Breathing: *** Scar tissue:  {Yes/No:304960894}*** Active Straight Leg Raise: ***                External Perineal Exam: ***                             Internal Pelvic Floor: ***  Patient confirms identification and approves PT to assess internal pelvic floor and treatment {yes/no:20286} All internal or external pelvic floor assessments and/or treatments are completed with proper hand hygiene and gloves hands. If needed gloves are changed with hand hygiene during patient care time.  PELVIC MMT:   MMT eval  Vaginal   Internal Anal Sphincter   External Anal Sphincter   Puborectalis   (Blank rows = not tested)        TONE: ***  PROLAPSE: ***  TODAY'S TREATMENT:  DATE: ***  EVAL ***   PATIENT EDUCATION:  Education details: *** Person educated: {Person educated:25204} Education method: {Education Method:25205} Education comprehension: {Education Comprehension:25206}  HOME EXERCISE PROGRAM: ***  ASSESSMENT:  CLINICAL IMPRESSION: Patient is a *** y.o. *** who was seen today for physical therapy evaluation and treatment for ***.   OBJECTIVE IMPAIRMENTS: {opptimpairments:25111}.   ACTIVITY LIMITATIONS: {activitylimitations:27494}  PARTICIPATION LIMITATIONS: {participationrestrictions:25113}  PERSONAL FACTORS: {Personal factors:25162} are also affecting patient's functional outcome.   REHAB POTENTIAL: {rehabpotential:25112}  CLINICAL DECISION MAKING: {clinical decision making:25114}  EVALUATION COMPLEXITY: {Evaluation complexity:25115}   GOALS: Goals reviewed with patient? {yes/no:20286}  SHORT TERM GOALS: Target date: ***  *** Baseline: Goal status: INITIAL  2.  *** Baseline:  Goal status: INITIAL  3.  *** Baseline:  Goal status: INITIAL  4.  *** Baseline:  Goal status: INITIAL  5.  *** Baseline:  Goal status: INITIAL  6.  *** Baseline:  Goal  status: INITIAL  LONG TERM GOALS: Target date: ***  *** Baseline:  Goal status: INITIAL  2.  *** Baseline:  Goal status: INITIAL  3.  *** Baseline:  Goal status: INITIAL  4.  *** Baseline:  Goal status: INITIAL  5.  *** Baseline:  Goal status: INITIAL  6.  *** Baseline:  Goal status: INITIAL  PLAN:  PT FREQUENCY: {rehab frequency:25116}  PT DURATION: {rehab duration:25117}  PLANNED INTERVENTIONS: {rehab planned interventions:25118::97110-Therapeutic exercises,97530- Therapeutic [phone]- Neuromuscular re-education,97535- Self Rjmz,02859- Manual therapy,Patient/Family education}  PLAN FOR NEXT SESSION: ***   Sondi Desch, PT 12/07/2023, 8:20 AM

## 2023-12-13 ENCOUNTER — Other Ambulatory Visit: Payer: Self-pay | Admitting: Gastroenterology

## 2023-12-27 NOTE — Therapy (Deleted)
 OUTPATIENT PHYSICAL THERAPY FEMALE PELVIC EVALUATION   Patient Name: Amanda [name] MRN: [id number] DOB:Jun 16, 1980, 43 y.o., female Today's Date: 12/27/2023  END OF SESSION:   Past Medical History:  Diagnosis Date   Anginal pain    r/t anxiety   Anxiety    on meds   Asthma    no inhaler - seasonal   Constipation    Depression    on meds   Dysrhythmia    Palpatations - r/t anxiety   Genital warts    GERD (gastroesophageal reflux disease)    on meds   H/O hiatal hernia    surgery to repair   Headache(784.0)    Heart murmur    History of low transverse cesarean section 02/12/2018   MRSA cellulitis    greater than 10 yrs ago dx at urgent care    Seasonal allergies    Sickle cell trait    Past Surgical History:  Procedure Laterality Date   CERVICAL CONE BIOPSY     CESAREAN SECTION  01/20/2012   Procedure: CESAREAN SECTION;  Surgeon: Debby JULIANNA Lares, MD;  Location: [facility];  Service: Obstetrics;  Laterality: N/A;   CESAREAN SECTION WITH BILATERAL TUBAL LIGATION N/A 02/13/2018   Procedure: CESAREAN SECTION WITH BILATERAL TUBAL LIGATION;  Surgeon: Danielle Rom, MD;  Location: [facility];  Service: Obstetrics;  Laterality: N/A;  Powell, RNFA  Needs [name]   COLONOSCOPY     DILATION AND EVACUATION N/A 03/30/2017   Procedure: DILATATION AND EVACUATION WITH ULTRASOUND GUIDANCE;  Surgeon: Danielle Rom, MD;  Location: [facility];  Service: Gynecology;  Laterality: N/A;   HERNIA REPAIR     INSERTION OF MESH N/A 05/15/2015   Procedure: INSERTION OF MESH;  Surgeon: Lynda Leos, MD;  Location: [facility];  Service: General;  Laterality: N/A;   MARSUPIALIZATION URETHRAL DIVERTICULUM     UMBILICAL HERNIA REPAIR N/A 05/15/2015   Procedure: LAPAROSCOPIC UMBILICAL HERNIA;  Surgeon: Lynda Leos, MD;  Location: [facility];  Service: General;  Laterality: N/A;   UPPER GI ENDOSCOPY     WISDOM TOOTH EXTRACTION     Patient Active Problem List   Diagnosis Date  Noted   Iron deficiency anemia due to chronic blood loss 02/22/2023   Allergy history, drug 02/23/2022   Snoring 06/08/2021   Screening for cardiovascular condition 12/07/2018   Mixed hyperlipidemia 12/07/2018   Benign gestational thrombocytopenia 11/06/2018   Status post repeat low transverse cesarean section 02/13/2018   History of low transverse cesarean section 02/12/2018   Decreased fetal movement 02/07/2018   Migraine 09/03/2017   Maternal age 44+, multigravida, antepartum 07/24/2017   Maternal obesity affecting pregnancy, antepartum 07/24/2017   Constipation 07/24/2017   Sickle cell trait 07/24/2017   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 pylori infection 11/02/2015   Intractable chronic migraine without aura and without status migrainosus 03/19/2015   Morbid obesity (HCC) 03/19/2015   Murmur 06/20/2012   INSOMNIA 08/14/2009   DYSMENORRHEA 12/09/2008   FATIGUE 03/25/2008   DEPRESSION/ANXIETY 09/11/2007   Headache 09/11/2007   PALPITATIONS 02/23/2007   Atypical chest pain 02/23/2007   Allergic rhinitis 10/06/2006   Asthma 10/06/2006   GERD 10/06/2006   Asthma 10/06/2006    PCP: Rosalea Rosina SAILOR, PA  REFERRING PROVIDER: Cirigliano, Vito  V, DO   REFERRING DIAG:  K59.00 (ICD-10-CM) - Constipation, unspecified constipation type  K64.8 (ICD-10-CM) - Internal hemorrhoids  R27.8 (ICD-10-CM) - Dyssynergia    THERAPY DIAG:  No diagnosis found.  Rationale for Evaluation and Treatment: Rehabilitation  ONSET DATE: ***  SUBJECTIVE:                                                                                                                                                                                            SUBJECTIVE STATEMENT: pushing/straining to have BM ; External anal fissure in 9 o'clock position (when laying left lateral) with exquisite TTP.  Fluid intake:   FUNCTIONAL LIMITATIONS: ***  PERTINENT HISTORY:  Medications for current condition: topical NTG 0.125%  Surgeries: *** Other: anxiety, depression, asthma, sickle cell trait, GERD, obesity (BMI 47), migraines, umbilical hernia repair with mesh in 04/2015, cesarean.  Sexual abuse: {Yes/No:304960894}  PAIN:  Are you having pain? {yes/no:20286} NPRS scale: ***/10 Pain location: {pelvic pain location:27098}  Pain type: {type:313116} Pain description: {PAIN DESCRIPTION:21022940}   Aggravating factors: *** Relieving factors: ***  PRECAUTIONS: None  RED FLAGS: {PT Red Flags:29287}   WEIGHT BEARING RESTRICTIONS: No  FALLS:  Has patient fallen in last 6 months? {fallsyesno:27318}  OCCUPATION: ***  ACTIVITY LEVEL : ***  PLOF: {PLOF:24004}  PATIENT GOALS: ***   BOWEL MOVEMENT: Pain with bowel movement: {yes/no:20286} Type of bowel movement:{PT BM type:27100} Fully empty rectum: {No/Yes:304960894} Leakage: {Yes/No:304960894}                                                  Caused by: *** Bowel urgency: *** Pads: {Yes/No:304960894} Fiber supplement/laxative {YES/NO AS:20300}  URINATION: Pain with urination: {yes/no:20286} Fully empty bladder: {Yes/No:304960894}***                                         Post-void dribble: {YES/NO AS:20300} Stream: {PT urination:27102} Urgency: {YES/NO AS:20300} Frequency:during the day ***                                                        Nocturia: {Yes/No:304960894}***   Leakage: {PT leakage:27103} Pads/briefs: {Yes/No:304960894}  INTERCOURSE:  Ability to have vaginal penetration {YES/NO:21197} Pain with intercourse: {pain with intercourse PA:27099}  Dryness: {YES/NO AS:20300} Climax: *** Marinoff Scale: ***/3 Lubricant:  PREGNANCY: Vaginal deliveries  *** Tearing {Yes***/No:304960894} Episiotomy {YES/NO AS:20300} C-section deliveries *** Currently pregnant {Yes***/No:304960894}  PROLAPSE: {PT prolapse:27101}   OBJECTIVE:  Note: Objective measures were completed at Evaluation unless otherwise noted.  DIAGNOSTIC FINDINGS:  Post-void residual: Voiding Cystourethrogram (VCUG):  Ultrasound: ***  PATIENT SURVEYS:  {rehab surveys:24030}  PFIQ-7: *** UIQ-7 *** CRAIG -7 *** POPIQ-7 *** Female Sexual Function Index (FSFI) Questionnaire ***  COGNITION: Overall cognitive status: {cognition:24006}     SENSATION: Light touch: {intact/deficits:24005}  LUMBAR SPECIAL TESTS:  {lumbar special test:25242}  FUNCTIONAL TESTS:  {Functional tests:24029} Single leg stance:  Rt:  Lt: Sit-up test: Squat: Bed mobility:  GAIT: Assistive device utilized: {Assistive devices:23999} Comments: ***  POSTURE: {posture:25561}   LUMBARAROM/PROM:  A/PROM A/PROM  Eval (% available)  Flexion   Extension   Right lateral flexion   Left lateral flexion   Right rotation   Left rotation    (Blank rows = not tested)  LOWER EXTREMITY ROM:  {AROM/PROM:27142} ROM Right eval Left eval  Hip flexion    Hip extension    Hip abduction    Hip adduction    Hip internal rotation    Hip external rotation    Knee flexion    Knee extension    Ankle dorsiflexion    Ankle plantarflexion    Ankle inversion    Ankle eversion     (Blank rows = not tested)  LOWER EXTREMITY MMT:  MMT Right eval Left eval  Hip flexion    Hip extension    Hip abduction    Hip adduction    Hip internal rotation    Hip external rotation    Knee flexion    Knee extension    Ankle dorsiflexion    Ankle plantarflexion    Ankle inversion    Ankle eversion     (Blank rows = not tested) PALPATION:  General: ***  Pelvic Alignment: ***  Abdominal: ***  Diastasis: {Yes/No:304960894}*** Distortion: {YES/NO AS:20300}  Breathing: *** Scar tissue:  {Yes/No:304960894}*** Active Straight Leg Raise: ***                External Perineal Exam: ***                             Internal Pelvic Floor: ***  Patient confirms identification and approves PT to assess internal pelvic floor and treatment {yes/no:20286} All internal or external pelvic floor assessments and/or treatments are completed with proper hand hygiene and gloves hands. If needed gloves are changed with hand hygiene during patient care time.  PELVIC MMT:   MMT eval  Vaginal   Internal Anal Sphincter   External Anal Sphincter   Puborectalis   (Blank rows = not tested)        TONE: ***  PROLAPSE: ***  TODAY'S TREATMENT:  DATE: ***  EVAL ***   PATIENT EDUCATION:  Education details: *** Person educated: {Person educated:25204} Education method: {Education Method:25205} Education comprehension: {Education Comprehension:25206}  HOME EXERCISE PROGRAM: ***  ASSESSMENT:  CLINICAL IMPRESSION: Patient is a *** y.o. *** who was seen today for physical therapy evaluation and treatment for ***.   OBJECTIVE IMPAIRMENTS: {opptimpairments:25111}.   ACTIVITY LIMITATIONS: {activitylimitations:27494}  PARTICIPATION LIMITATIONS: {participationrestrictions:25113}  PERSONAL FACTORS: {Personal factors:25162} are also affecting patient's functional outcome.   REHAB POTENTIAL: {rehabpotential:25112}  CLINICAL DECISION MAKING: {clinical decision making:25114}  EVALUATION COMPLEXITY: {Evaluation complexity:25115}   GOALS: Goals reviewed with patient? {yes/no:20286}  SHORT TERM GOALS: Target date: ***  *** Baseline: Goal status: INITIAL  2.  *** Baseline:  Goal status: INITIAL  3.  *** Baseline:  Goal status: INITIAL  4.  *** Baseline:  Goal status: INITIAL  5.  *** Baseline:  Goal status: INITIAL  6.  *** Baseline:  Goal  status: INITIAL  LONG TERM GOALS: Target date: ***  *** Baseline:  Goal status: INITIAL  2.  *** Baseline:  Goal status: INITIAL  3.  *** Baseline:  Goal status: INITIAL  4.  *** Baseline:  Goal status: INITIAL  5.  *** Baseline:  Goal status: INITIAL  6.  *** Baseline:  Goal status: INITIAL  PLAN:  PT FREQUENCY: {rehab frequency:25116}  PT DURATION: {rehab duration:25117}  PLANNED INTERVENTIONS: {rehab planned interventions:25118::97110-Therapeutic exercises,97530- Therapeutic [phone]- Neuromuscular re-education,97535- Self Rjmz,02859- Manual therapy,Patient/Family education}  PLAN FOR NEXT SESSION: ***   Bryler Dibble, PT 12/27/2023, 4:02 PM

## 2023-12-28 ENCOUNTER — Encounter: Admitting: Physical Therapy

## 2023-12-28 ENCOUNTER — Other Ambulatory Visit: Payer: Self-pay | Admitting: Physician Assistant

## 2023-12-28 DIAGNOSIS — Z1231 Encounter for screening mammogram for malignant neoplasm of breast: Secondary | ICD-10-CM

## 2024-02-09 NOTE — Progress Notes (Unsigned)
"             ° °   Ben Jackson D[facility] [address] [place] [address] Phone: [phone]   Assessment and Plan:     ***    Pertinent previous records reviewed include ***   Follow Up: ***     Subjective:   I, Amanda [name], am serving as a neurosurgeon for Doctor Morene Mace  Chief Complaint: hand pain   HPI:   02/12/2024 Patient is a 44 year old female with hand pain. Patient states   Relevant Historical Information: ***  Additional pertinent review of systems negative.  Current Medications[1]   Objective:     There were no vitals filed for this visit.    There is no height or weight on file to calculate BMI.    Physical Exam:    ***   Electronically signed by:  Odis Mace [facility] 7:22 AM 02/09/24    [1]  Current Outpatient Medications:    AMBULATORY NON FORMULARY MEDICATION, Nitroglycerin 0.125% gel apply a pea size amount to your rectum two times daily for 6 weeks., Disp: 30 g, Rfl: 0   dicyclomine  (BENTYL ) 10 MG capsule, Take 1 capsule (10 mg total) by mouth every 6 (six) hours as needed for spasms., Disp: 60 capsule, Rfl: 3   esomeprazole  (NEXIUM ) 40 MG capsule, Take 1 capsule (40 mg total) by mouth 2 (two) times daily before a meal., Disp: 180 capsule, Rfl: 3   famotidine  (PEPCID ) 20 MG tablet, Take 1 tablet (20 mg total) by mouth 2 (two) times daily., Disp: 60 tablet, Rfl: 3   fluticasone  (FLONASE ) 50 MCG/ACT nasal spray, Place 2 sprays into both nostrils daily., Disp: 16 g, Rfl: 0   linaclotide  (LINZESS ) 145 MCG CAPS capsule, Take 1 capsule (145 mcg total) by mouth daily before breakfast., Disp: 90 capsule, Rfl: 3   meloxicam  (MOBIC ) 15 MG tablet, Take 1 tablet (15 mg total) by mouth daily as needed for pain., Disp: 30 tablet, Rfl: 0   ondansetron  (ZOFRAN  ODT) 4 MG disintegrating tablet, Take 1 tablet (4 mg total) by mouth every 8 (eight) hours as needed for nausea or vomiting., Disp: 20  tablet, Rfl: 5   SUMAtriptan  ([name] ) 20 MG/ACT nasal spray, 1 spray in nostril.  May repeat x1 in 2 hours if headache persists or recurs., Disp: 10 each, Rfl: 5  "

## 2024-02-12 ENCOUNTER — Ambulatory Visit: Admitting: Sports Medicine

## 2024-02-12 VITALS — HR 86 | Ht 63.0 in | Wt 269.0 lb

## 2024-02-12 DIAGNOSIS — M5412 Radiculopathy, cervical region: Secondary | ICD-10-CM | POA: Diagnosis not present

## 2024-02-12 DIAGNOSIS — S76311A Strain of muscle, fascia and tendon of the posterior muscle group at thigh level, right thigh, initial encounter: Secondary | ICD-10-CM | POA: Diagnosis not present

## 2024-02-12 DIAGNOSIS — M542 Cervicalgia: Secondary | ICD-10-CM

## 2024-02-12 MED ORDER — MELOXICAM 15 MG PO TABS: ORAL_TABLET | ORAL | 0 refills | Status: AC

## 2024-02-12 NOTE — Progress Notes (Signed)
 "               Odis Mace D[facility] [address] [place] [address] Phone: [phone]   Assessment and Plan:     1. Strain of right hamstring muscle, initial encounter (Primary) -Chronic with exacerbation, initial sports medicine visit - Recurrent strain of right hamstring after performing a splits motion while at dance class last night.  History of similar type injury 10+ years ago that gradually resolved - No evidence of complete hamstring tear on physical exam, so no additional imaging at this time.  If no significant proving at follow-up visit, would perform ultrasound - Start HEP for hamstring - Recommend ice for 48 hours after initial injury, and then transition to heat - Start meloxicam  15 mg daily x2 weeks.  If still having pain after 2 weeks, complete 3rd-week of NSAID. May use remaining NSAID as needed once daily for pain control.  Do not to use additional over-the-counter NSAIDs (ibuprofen , naproxen , Advil , Aleve , etc.) while taking prescription NSAIDs.  May use Tylenol  [phone] mg 2 to 3 times a day for breakthrough pain.  15 additional minutes spent for educating Therapeutic Home Exercise Program.  This included exercises focusing on stretching, strengthening, with focus on eccentric aspects.   Long term goals include an improvement in range of motion, strength, endurance as well as avoiding reinjury. Patient's frequency would include in 1-2 times a day, 3-5 times a week for a duration of 6-12 weeks. Proper technique shown and discussed handout in great detail with ATC.  All questions were discussed and answered.    2. Neck pain 3. Cervical radiculopathy at C8 -Chronic with exacerbation, subsequent visit - Recurrence of neck pain with radicular symptoms into left upper extremity.  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  Order placed - Start meloxicam  15 mg daily x2 weeks.  If still having pain after 2 weeks, complete 3rd-week of NSAID. May use remaining NSAID as needed once daily for pain control.  Do not to use additional over-the-counter NSAIDs (ibuprofen , naproxen , Advil , Aleve , etc.) while taking prescription NSAIDs.  May use Tylenol  [phone] mg 2 to 3 times a day for breakthrough pain. - Continue HEP for neck     Pertinent previous records reviewed include none   Follow Up: 2 weeks after epidural to review benefit.  Would reevaluate right hamstring and could consider ultrasound if no improvement   Subjective:   I, Moenique Parris, am serving as a neurosurgeon for Doctor Morene Mace  Chief Complaint: hand and hamstring pain   HPI:  12/07/22 Patient is a 44 year old female with concerns of neck pain. Patient states that she has had neck pain for years. Pain radiates to her head and gives her headaches. Does get numbness to her hand. Is in PT. Muscle relaxer helps a little. Decrease ROM.    01/04/2023 Patient states that she is okay . OMT really helped . Meloxicam  didn't really do much    01/31/2022 Patient states that she is doing fine    03/01/2023 Patient states arm is hurting really bad right now and have a headache that has been going on for about two weeks.    04/17/2023 Patient states she is a little tight  02/12/2024 Patient is a 44 year old female with hand pain. Patient states she was a dance practice last night. Right hamstring pain  she was doing her dance routine and felt a crack. Endorses antalgic gait. She has video. Muscle relaxer helped to go to sleep.  Left hand pain does endorse numbness and tingling from neck pain    Relevant Historical Information: GERD  Additional pertinent review of systems negative.  Current Medications[1]   Objective:      Vitals:   02/12/24 1308  Pulse: 86  SpO2: 99%  Weight: 269 lb (122 kg)  Height: 5' 3 (1.6 m)      Body mass index is 47.65 kg/m.    Physical Exam:    Neck Exam: Cervical Spine- Posture normal Skin- normal, intact   Neuro:  Strength-   Right Left  Deltoid (C5) 5/5 5/5 Bicep/Brachioradialis (C5/6) 5/5  5/5 Wrist Extension (C6) 5/5 5/5 Tricep (C7) 5/5 5/5 Wrist Flexion (C7) 5/5 5/5 Grip (C8) 5/5 5/5 Finger Abduction (T1) 5/5 5/5   Sensation: Decreased sensation over posterior left forearm, fifth digit, ulnar-sided forearm compared to right.  Otherwise, intact to light touch in upper extremities bilaterally   Spurling's:  negative bilaterally Neck ROM: Full active ROM  TTP: cervical spinous processes, cervical paraspinal, thoracic paraspinal, trapezius    General: Well-appearing, cooperative, sitting comfortably in no acute distress.   Right lower extremity: TTP along central and distal hamstring Full knee flexion and extension Pain along mid and distal hamstring with resisted knee flexion without deformity No gross deformity, ecchymosis, erythema  Electronically signed by:  Odis Mace [facility] 1:35 PM 02/12/24     [1]  Current Outpatient Medications:    meloxicam  (MOBIC ) 15 MG tablet, Take 1 tablet daily for 2 weeks.  If still in pain after 2 weeks, take 1 tablet daily for an additional 1 week., Disp: 30 tablet, Rfl: 0   AMBULATORY NON FORMULARY MEDICATION, Nitroglycerin 0.125% gel apply a pea size amount to your rectum two times daily for 6 weeks., Disp: 30 g, Rfl: 0   dicyclomine  (BENTYL ) 10 MG capsule, Take 1 capsule (10 mg total) by mouth every 6 (six) hours as needed for spasms., Disp: 60 capsule, Rfl: 3   esomeprazole  (NEXIUM ) 40 MG capsule, Take 1 capsule (40 mg total) by mouth 2 (two) times daily before a meal., Disp: 180 capsule, Rfl: 3   famotidine  (PEPCID ) 20 MG tablet, Take 1 tablet (20 mg total) by mouth 2 (two) times  daily., Disp: 60 tablet, Rfl: 3   fluticasone  (FLONASE ) 50 MCG/ACT nasal spray, Place 2 sprays into both nostrils daily., Disp: 16 g, Rfl: 0   linaclotide  (LINZESS ) 145 MCG CAPS capsule, Take 1 capsule (145 mcg total) by mouth daily before breakfast., Disp: 90 capsule, Rfl: 3   meloxicam  (MOBIC ) 15 MG tablet, Take 1 tablet (15 mg total) by mouth daily as needed for pain., Disp: 30 tablet, Rfl: 0   ondansetron  (ZOFRAN  ODT) 4 MG disintegrating tablet, Take 1 tablet (4 mg total) by mouth every 8 (eight) hours as needed for nausea or vomiting., Disp: 20 tablet, Rfl: 5   SUMAtriptan  ([name] ) 20 MG/ACT nasal spray, 1 spray in nostril.  May repeat x1 in 2 hours if headache persists or recurs., Disp: 10 each, Rfl: 5  "

## 2024-02-12 NOTE — Patient Instructions (Addendum)
-   Start meloxicam  15 mg daily x2 weeks.  If still having pain after 2 weeks, complete 3rd-week of NSAID. May use remaining NSAID as needed once daily for pain control.  Do not to use additional over-the-counter NSAIDs (ibuprofen , naproxen , Advil , Aleve , etc.) while taking prescription NSAIDs.  May use Tylenol  [phone] mg 2 to 3 times a day for breakthrough pain.  Hamstring HEP  Ice for the remainder of today and then switch to heat  epidural left C7-T1  Follow up 2 weeks after epidural

## 2024-02-13 ENCOUNTER — Ambulatory Visit
Admission: RE | Admit: 2024-02-13 | Discharge: 2024-02-13 | Disposition: A | Source: Ambulatory Visit | Attending: Physician Assistant | Admitting: Physician Assistant

## 2024-02-13 DIAGNOSIS — Z1231 Encounter for screening mammogram for malignant neoplasm of breast: Secondary | ICD-10-CM

## 2024-02-21 ENCOUNTER — Encounter: Payer: Self-pay | Admitting: Sports Medicine

## 2024-02-21 NOTE — Discharge Instructions (Signed)

## 2024-02-22 ENCOUNTER — Inpatient Hospital Stay
Admission: RE | Admit: 2024-02-22 | Discharge: 2024-02-22 | Disposition: A | Discharge status: Home / Self Care | Source: Ambulatory Visit | Attending: Sports Medicine | Admitting: Sports Medicine

## 2024-02-28 NOTE — Therapy (Unsigned)
 " OUTPATIENT PHYSICAL THERAPY FEMALE PELVIC EVALUATION   Patient Name: Amanda [name] MRN: [id number] DOB:Sep 14, 1980, 44 y.o., female Today's Date: 02/29/2024  END OF SESSION:  PT End of Session - 02/29/24 1352     Visit Number 1    Date for Recertification  08/28/24    Authorization Type [employer]    PT Start Time 1345    PT Stop Time 1440    PT Time Calculation (min) 55 min    Activity Tolerance Patient tolerated treatment well    Behavior During Therapy WFL for tasks assessed/performed          Past Medical History:  Diagnosis Date   Anginal pain    r/t anxiety   Anxiety    on meds   Asthma    no inhaler - seasonal   Constipation    Depression    on meds   Dysrhythmia    Palpatations - r/t anxiety   Genital warts    GERD (gastroesophageal reflux disease)    on meds   H/O hiatal hernia    surgery to repair   Headache(784.0)    Heart murmur    History of low transverse cesarean section 02/12/2018   MRSA cellulitis    greater than 10 yrs ago dx at urgent care    Seasonal allergies    Sickle cell trait    Past Surgical History:  Procedure Laterality Date   CERVICAL CONE BIOPSY     CESAREAN SECTION  01/20/2012   Procedure: CESAREAN SECTION;  Surgeon: Debby JULIANNA Lares, MD;  Location: [facility];  Service: Obstetrics;  Laterality: N/A;   CESAREAN SECTION WITH BILATERAL TUBAL LIGATION N/A 02/13/2018   Procedure: CESAREAN SECTION WITH BILATERAL TUBAL LIGATION;  Surgeon: Danielle Rom, MD;  Location: [facility];  Service: Obstetrics;  Laterality: N/A;  Powell, RNFA  Needs [name]   COLONOSCOPY     DILATION AND EVACUATION N/A 03/30/2017   Procedure: DILATATION AND EVACUATION WITH ULTRASOUND GUIDANCE;  Surgeon: Danielle Rom, MD;  Location: [facility];  Service: Gynecology;  Laterality: N/A;   HERNIA REPAIR     INSERTION OF MESH N/A 05/15/2015   Procedure: INSERTION OF MESH;  Surgeon: Lynda Leos, MD;  Location: [facility];  Service: General;   Laterality: N/A;   MARSUPIALIZATION URETHRAL DIVERTICULUM     UMBILICAL HERNIA REPAIR N/A 05/15/2015   Procedure: LAPAROSCOPIC UMBILICAL HERNIA;  Surgeon: Lynda Leos, MD;  Location: [facility];  Service: General;  Laterality: N/A;   UPPER GI ENDOSCOPY     WISDOM TOOTH EXTRACTION     Patient Active Problem List   Diagnosis Date Noted   Iron deficiency anemia due to chronic blood loss 02/22/2023   Allergy history, drug 02/23/2022   Snoring 06/08/2021   Screening for cardiovascular condition 12/07/2018   Mixed hyperlipidemia 12/07/2018   Benign gestational thrombocytopenia 11/06/2018   Status post repeat low transverse cesarean section 02/13/2018   History of low transverse cesarean section 02/12/2018   Decreased fetal movement 02/07/2018   Migraine 09/03/2017   Maternal age 64+, multigravida, antepartum 07/24/2017   Maternal obesity affecting pregnancy, antepartum 07/24/2017   Constipation 07/24/2017   Sickle cell trait 07/24/2017   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 pylori infection 11/02/2015  Intractable chronic migraine without aura and without status migrainosus 03/19/2015   Morbid obesity (HCC) 03/19/2015   Murmur 06/20/2012   INSOMNIA 08/14/2009   DYSMENORRHEA 12/09/2008   FATIGUE 03/25/2008   DEPRESSION/ANXIETY 09/11/2007   Headache 09/11/2007   PALPITATIONS 02/23/2007   Atypical chest pain 02/23/2007   Allergic rhinitis 10/06/2006   Asthma 10/06/2006   GERD 10/06/2006   Asthma 10/06/2006    PCP: Rosalea Rosina SAILOR, PA  REFERRING PROVIDER: [place] Sandor GAILS, DO   REFERRING DIAG:  K59.00 (ICD-10-CM) - Constipation, unspecified constipation type  K64.8 (ICD-10-CM) - Internal hemorrhoids  R27.8 (ICD-10-CM) -  Dyssynergia    THERAPY DIAG:  No diagnosis found.  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 Patient went to MD that found out she still has a UTI and bladder tilted.  Fluid intake: water    PERTINENT HISTORY:  Medications for current condition: none Surgeries: cesarean x 2 ; Marsupialization urethra diverticulum Other: history of anxiety, depression, asthma, sickle cell trait, GERD, obesity (BMI 47), migraines, umbilical hernia repair with mesh in 04/2015, cesarean.  Sexual abuse: No  PAIN:  Are you having pain? Yes NPRS scale: 5/10 Pain location: pelvis  Pain type: aching Pain description: intermittent   Aggravating factors: sitting Relieving factors: heating pad  PRECAUTIONS: None  RED FLAGS: None   WEIGHT BEARING RESTRICTIONS: No  FALLS:  Has patient fallen in last 6 months? Yes. Number of falls 1 time when she fell in the snow and not due to balance  OCCUPATION: child care  ACTIVITY LEVEL : dance  PLOF: Independent  PATIENT GOALS: poop freely, reduce urgency, reduce leakage   BOWEL MOVEMENT: Pain with bowel movement: Yes, pain level 4-5/10 Type of bowel movement:Type (Bristol Stool Scale) Type 1,2 4,6, Frequency daily or every other day, and Strain yes Fully empty rectum: Yes: sometimes Leakage: No                                                 Bowel urgency: no Pads: No Fiber supplement/laxative No  URINATION: Pain with urination: No Fully empty bladder: Nocan squeeze more urine out, will rock hips  Post-void dribble: No Stream: end will squirt urine out Urgency: Yes  Frequency:during the day 25                                                         Nocturia: Yes: 4   Leakage: Walking to the bathroom Pads/briefs: No  INTERCOURSE:  Ability to have vaginal penetration Yes  Pain with intercourse: none  PREGNANCY: C-section deliveries 2 Currently pregnant No  PROLAPSE: None   OBJECTIVE:  Note: Objective measures were completed at Evaluation unless otherwise noted.  DIAGNOSTIC FINDINGS:  Physical exam notable for external anal fissure in the 9:00 position.   PATIENT SURVEYS:  PFIQ-7: 29 UIQ-7 10 CRAIG -7 10 POPIQ-7 10  COGNITION: Overall cognitive status: Within functional limits for tasks assessed     SENSATION: Light touch: Appears intact  FUNCTIONAL TESTS:  Squat: only able to squat 1/2 way   POSTURE: rounded shoulders, forward head, increased lumbar lordosis, and anterior pelvic tilt   LUMBARAROM/PROM: lumbar ROM is decreased by 25%   LOWER EXTREMITY ROM: bilateral  hip ROM is full   LOWER EXTREMITY MMT  MMT Right eval Left eval  Hip flexion 4/5 5/5  Hip extension 4/5 5/5  Hip abduction 3/5 3/5  Hip adduction 3/5 4/5   (Blank rows = not tested) PALPATION: Pelvic Alignment: ASIS is equal  Abdominal: difficulty with contracting her abdomen  Diastasis: No Distortion: No  Breathing: decreased lower rib cage movement Scar tissue: Yes: decreased mobility of c-section scar                External Perineal Exam: rectum has increased skin folds                             Internal Pelvic Floor: decreased movement of the puborectalis and is tender; some difficulty with pushing therapist finger out of the canal but after instructions was able to perform.   Patient confirms identification and approves PT to assess internal pelvic floor and treatment Yes All internal or external pelvic floor assessments and/or treatments are completed with proper hand hygiene and gloves hands. If needed gloves are changed with hand hygiene during patient care time.  PELVIC MMT:   MMT eval   Vaginal   Internal Anal Sphincter 3/5  External Anal Sphincter 3/5  Puborectalis 3/5  (Blank rows = not tested)        TONE: Average tone   TODAY'S TREATMENT:                                                                                                                              DATE: 02/29/24  EVAL Examination completed, findings reviewed, pt educated on POC, HEP, and female pelvic floor anatomy,  reasoning with pelvic floor assessment internally with pt consent, and how to have a bowel movement. Pt motivated to participate in PT and agreeable to attempt recommendations.     PATIENT EDUCATION:  02/29/24 Education details: educated patient on how to have a bowel movement Person educated: Patient Education method: Programmer, Multimedia, Demonstration, Tactile cues, Verbal cues, and Handouts Education comprehension: verbalized understanding, returned demonstration, verbal cues required, tactile cues required, and needs further education  HOME EXERCISE PROGRAM: See above  For all possible CPT codes, reference the Planned Interventions line above.     Check all conditions that are expected to impact treatment: {Conditions expected to impact treatment:None of these apply   If treatment provided at initial evaluation, no treatment charged due to lack of authorization.       ASSESSMENT:  CLINICAL IMPRESSION: Patient is a 44 y.o. female who was seen today for physical therapy evaluation and treatment for constipation, Dyssynergia. Patient has had these issues since 2013. She presently has a UTI causing urinary frequency, urinary leakage and difficulty fully emptying her bladder. Patient has to strain to have a bowel movement and has type 1,2, 4, 6. She will sit on the commode for 40 minutes waiting for the stool to come out. She does not always feel like she can fully empty her rectum. She has weakness in her hips and decreased lumbar ROM. She has difficulty with fully expanding her lower rib  cage. She has decreased mobility of her c-section scar. Rectal strength is 3/5 and pelvic strength will be assessed in future if needed. Patient will benefit from skilled therapy to improve pelvic floor coordination and strength to fully empty her rectum and bladder.   OBJECTIVE IMPAIRMENTS: decreased coordination, decreased ROM, decreased strength, increased fascial restrictions, and pain.   ACTIVITY LIMITATIONS: continence, toileting, and locomotion level  PARTICIPATION LIMITATIONS: community activity  PERSONAL FACTORS: 1-2 comorbidities: history of anxiety, depression, asthma, sickle cell trait, GERD, obesity (BMI 47), migraines, umbilical hernia repair with mesh in 04/2015, cesarean.  are also affecting patient's functional outcome.   REHAB POTENTIAL: Excellent  CLINICAL DECISION MAKING: Evolving/moderate complexity  EVALUATION COMPLEXITY: Moderate   GOALS: Goals reviewed with patient? Yes  SHORT TERM GOALS: Target date: 03/28/24  Patient instructed on how to have a bowel movement on the commode.  Baseline:not educated yet Goal status: INITIAL  2.  Patient educated on diaphragmatic breathing to relax her pelvic floor with urination and bowel movement.  Baseline: not educated yet Goal status: INITIAL  3.  Patient educated on scar massage to improve c-section scar mobility.  Baseline: not educated yet.  Goal status: INITIAL  4.  Patient educated on abdominal massage to assist with peristalic motion of the intestines for bowel movements.  Baseline: not educated yet Goal status: INITIAL   LONG TERM GOALS: Target date: 08/28/24  Patient independent with advanced HEP for core and pelvic floor to improve bowel movements and urination.  Baseline: not educated yet.  Goal status: INITIAL  2.  Patient able to sit on the commode to fully empty her bladder due to the ability to relax her pelvic floor and breath correctly.  Baseline: sits and has more urine come out.  Goal status:  INITIAL  3.  Patient is able to sit on the commode for 10 minutes or less and fully empty her rectum due to improve coordination of the muscles.  Baseline: sits for 40 minutes.  Goal status: INITIAL  4.  Patient is able to have a bowel movement without  pain due to improve coordination of muscles.  Baseline: pain level 4/10.  Goal status: INITIAL   PLAN:  PT FREQUENCY: 1x/week  PT DURATION: 6 months  PLANNED INTERVENTIONS: 97110-Therapeutic exercises, 97530- Therapeutic activity, 97112- Neuromuscular re-education, 97535- Self Care, 02859- Manual therapy, G0283- Electrical stimulation (unattended), 20560 (1-2 muscles), 20561 (3+ muscles)- Dry Needling, Patient/Family education, Joint mobilization, Spinal mobilization, Scar mobilization, Cryotherapy, Moist heat, and Biofeedback  PLAN FOR NEXT SESSION: diaphragmatic breathing, scar massage, hip strength and stretches,    Channing Pereyra, PT 02/29/24 3:01 PM Channing Pereyra, PT [facility] [address], [address] [place], [place] [address] W: [phone] [email or web address]   "

## 2024-02-29 ENCOUNTER — Encounter: Payer: Self-pay | Admitting: Physical Therapy

## 2024-02-29 ENCOUNTER — Other Ambulatory Visit: Payer: Self-pay

## 2024-02-29 ENCOUNTER — Ambulatory Visit: Admitting: Physical Therapy

## 2024-02-29 DIAGNOSIS — R102 Pelvic and perineal pain unspecified side: Secondary | ICD-10-CM

## 2024-02-29 DIAGNOSIS — M6281 Muscle weakness (generalized): Secondary | ICD-10-CM

## 2024-02-29 NOTE — Patient Instructions (Signed)

## 2024-03-01 ENCOUNTER — Other Ambulatory Visit

## 2024-03-05 ENCOUNTER — Other Ambulatory Visit

## 2024-03-14 ENCOUNTER — Encounter: Payer: Self-pay | Admitting: Physical Therapy

## 2024-04-09 ENCOUNTER — Encounter: Payer: Self-pay | Admitting: Physical Therapy

## 2024-05-09 ENCOUNTER — Encounter: Payer: Self-pay | Admitting: Physical Therapy

## 2024-08-21 ENCOUNTER — Inpatient Hospital Stay: Admitting: Hematology

## 2024-08-21 ENCOUNTER — Inpatient Hospital Stay
